# Patient Record
Sex: Female | Born: 1965
Health system: Southern US, Community
[De-identification: ages and names within clinical notes are randomized; demographics above are authoritative.]

## PROBLEM LIST (undated history)

## (undated) DIAGNOSIS — G971 Other reaction to spinal and lumbar puncture: Secondary | ICD-10-CM

## (undated) DIAGNOSIS — F32A Depression, unspecified: Secondary | ICD-10-CM

## (undated) DIAGNOSIS — G629 Polyneuropathy, unspecified: Secondary | ICD-10-CM

## (undated) DIAGNOSIS — E1165 Type 2 diabetes mellitus with hyperglycemia: Secondary | ICD-10-CM

## (undated) DIAGNOSIS — F419 Anxiety disorder, unspecified: Secondary | ICD-10-CM

## (undated) DIAGNOSIS — F329 Major depressive disorder, single episode, unspecified: Secondary | ICD-10-CM

## (undated) DIAGNOSIS — M1990 Unspecified osteoarthritis, unspecified site: Secondary | ICD-10-CM

## (undated) DIAGNOSIS — K219 Gastro-esophageal reflux disease without esophagitis: Secondary | ICD-10-CM

## (undated) DIAGNOSIS — F1910 Other psychoactive substance abuse, uncomplicated: Secondary | ICD-10-CM

## (undated) DIAGNOSIS — M1711 Unilateral primary osteoarthritis, right knee: Secondary | ICD-10-CM

## (undated) DIAGNOSIS — E785 Hyperlipidemia, unspecified: Secondary | ICD-10-CM

## (undated) DIAGNOSIS — B999 Unspecified infectious disease: Secondary | ICD-10-CM

## (undated) DIAGNOSIS — J189 Pneumonia, unspecified organism: Secondary | ICD-10-CM

## (undated) DIAGNOSIS — E119 Type 2 diabetes mellitus without complications: Secondary | ICD-10-CM

## (undated) DIAGNOSIS — I10 Essential (primary) hypertension: Secondary | ICD-10-CM

## (undated) HISTORY — PX: BUNIONECTOMY WITH HAMMERTOE RECONSTRUCTION: SHX5600

## (undated) HISTORY — DX: Type 2 diabetes mellitus without complications: E11.9

## (undated) HISTORY — DX: Hyperlipidemia, unspecified: E78.5

## (undated) HISTORY — PX: JOINT REPLACEMENT: SHX530

## (undated) HISTORY — DX: Other psychoactive substance abuse, uncomplicated: F19.10

## (undated) HISTORY — DX: Major depressive disorder, single episode, unspecified: F32.9

## (undated) HISTORY — PX: BACK SURGERY: SHX140

## (undated) HISTORY — DX: Depression, unspecified: F32.A

## (undated) HISTORY — DX: Essential (primary) hypertension: I10

## (undated) HISTORY — DX: Type 2 diabetes mellitus with hyperglycemia: E11.65

## (undated) HISTORY — DX: Anxiety disorder, unspecified: F41.9

## (undated) HISTORY — PX: VAGINAL HYSTERECTOMY: SUR661

## (undated) HISTORY — PX: TUBAL LIGATION: SHX77

---

## 1998-12-30 ENCOUNTER — Other Ambulatory Visit: Admission: RE | Admit: 1998-12-30 | Discharge: 1998-12-30 | Payer: Self-pay | Admitting: Obstetrics & Gynecology

## 1999-10-15 ENCOUNTER — Other Ambulatory Visit: Admission: RE | Admit: 1999-10-15 | Discharge: 1999-10-15 | Payer: Self-pay | Admitting: Obstetrics & Gynecology

## 2000-12-30 ENCOUNTER — Other Ambulatory Visit: Admission: RE | Admit: 2000-12-30 | Discharge: 2000-12-30 | Payer: Self-pay | Admitting: Obstetrics and Gynecology

## 2001-02-16 ENCOUNTER — Encounter (INDEPENDENT_AMBULATORY_CARE_PROVIDER_SITE_OTHER): Payer: Self-pay

## 2001-02-16 ENCOUNTER — Ambulatory Visit (HOSPITAL_COMMUNITY): Admission: RE | Admit: 2001-02-16 | Discharge: 2001-02-16 | Payer: Self-pay | Admitting: Obstetrics and Gynecology

## 2003-10-30 ENCOUNTER — Other Ambulatory Visit: Admission: RE | Admit: 2003-10-30 | Discharge: 2003-10-30 | Payer: Self-pay | Admitting: *Deleted

## 2004-12-15 ENCOUNTER — Other Ambulatory Visit: Admission: RE | Admit: 2004-12-15 | Discharge: 2004-12-15 | Payer: Self-pay | Admitting: Obstetrics and Gynecology

## 2004-12-18 ENCOUNTER — Observation Stay (HOSPITAL_COMMUNITY): Admission: RE | Admit: 2004-12-18 | Discharge: 2004-12-19 | Payer: Self-pay | Admitting: Obstetrics and Gynecology

## 2004-12-18 ENCOUNTER — Encounter (INDEPENDENT_AMBULATORY_CARE_PROVIDER_SITE_OTHER): Payer: Self-pay | Admitting: Specialist

## 2007-04-04 ENCOUNTER — Emergency Department (HOSPITAL_COMMUNITY): Admission: EM | Admit: 2007-04-04 | Discharge: 2007-04-04 | Payer: Self-pay | Admitting: Emergency Medicine

## 2007-07-03 ENCOUNTER — Inpatient Hospital Stay (HOSPITAL_COMMUNITY): Admission: RE | Admit: 2007-07-03 | Discharge: 2007-07-05 | Payer: Self-pay | Admitting: *Deleted

## 2007-07-03 ENCOUNTER — Ambulatory Visit: Payer: Self-pay | Admitting: *Deleted

## 2007-08-02 ENCOUNTER — Ambulatory Visit (HOSPITAL_COMMUNITY): Payer: Self-pay | Admitting: Psychiatry

## 2007-11-08 HISTORY — PX: POSTERIOR LUMBAR FUSION: SHX6036

## 2007-12-06 ENCOUNTER — Inpatient Hospital Stay (HOSPITAL_COMMUNITY): Admission: RE | Admit: 2007-12-06 | Discharge: 2007-12-09 | Payer: Self-pay | Admitting: Orthopedic Surgery

## 2007-12-07 ENCOUNTER — Ambulatory Visit: Payer: Self-pay | Admitting: Surgery

## 2007-12-07 ENCOUNTER — Encounter (INDEPENDENT_AMBULATORY_CARE_PROVIDER_SITE_OTHER): Payer: Self-pay | Admitting: Orthopedic Surgery

## 2007-12-20 ENCOUNTER — Encounter: Admission: RE | Admit: 2007-12-20 | Discharge: 2007-12-20 | Payer: Self-pay | Admitting: Orthopedic Surgery

## 2008-01-09 HISTORY — PX: POSTERIOR LUMBAR FUSION: SHX6036

## 2008-01-10 ENCOUNTER — Inpatient Hospital Stay (HOSPITAL_COMMUNITY): Admission: RE | Admit: 2008-01-10 | Discharge: 2008-01-11 | Payer: Self-pay | Admitting: Orthopedic Surgery

## 2008-03-06 ENCOUNTER — Ambulatory Visit (HOSPITAL_COMMUNITY): Payer: Self-pay | Admitting: Psychiatry

## 2009-08-19 ENCOUNTER — Encounter: Admission: RE | Admit: 2009-08-19 | Discharge: 2009-08-19 | Payer: Self-pay | Admitting: Obstetrics and Gynecology

## 2009-10-27 ENCOUNTER — Encounter: Admission: RE | Admit: 2009-10-27 | Discharge: 2009-10-27 | Payer: Self-pay | Admitting: Emergency Medicine

## 2010-03-25 ENCOUNTER — Encounter: Admission: RE | Admit: 2010-03-25 | Discharge: 2010-03-25 | Payer: Self-pay | Admitting: Emergency Medicine

## 2010-04-10 ENCOUNTER — Ambulatory Visit (HOSPITAL_COMMUNITY)
Admission: RE | Admit: 2010-04-10 | Discharge: 2010-04-10 | Payer: Self-pay | Source: Home / Self Care | Admitting: Psychiatry

## 2010-05-30 ENCOUNTER — Encounter: Payer: Self-pay | Admitting: Emergency Medicine

## 2010-05-31 ENCOUNTER — Encounter: Payer: Self-pay | Admitting: Obstetrics and Gynecology

## 2010-05-31 ENCOUNTER — Encounter: Payer: Self-pay | Admitting: Emergency Medicine

## 2010-09-22 NOTE — Op Note (Signed)
NAMESHALAYNE, LEACH NO.:  [id number]   MEDICAL RECORD NO.:  [id number]          PATIENT TYPE:  INP   LOCATION:  4153                         FACILITY:  [facility]   PHYSICIAN:  Alvy Beal, MD    DATE OF BIRTH:  09-25-1965   DATE OF PROCEDURE:  DATE OF DISCHARGE:                               OPERATIVE REPORT   PREOPERATIVE DIAGNOSIS:  Degenerative disk disease with discogenic back  pain, L5-S1.   POSTOPERATIVE DIAGNOSIS:  Degenerative disk disease with discogenic back  pain, L5-S1.   OPERATIVE PROCEDURE:  Anterior lumbar interbody fusion, L5-S1.   COMPLICATIONS:  None.   APPROACH SURGEON:  Balinda Quails, MD   FIRST ASSISTANT:  Crissie Reese, PA   COMPLICATIONS:  None.   INSTRUMENTATION USED:  Synthes SynFix 13.5 mm high 12 degree lordotic  interbody spacer packed with Actifuse with four locking screws 25 mm in  length into the body of L5, 20 mm in length into the body of S1.   HISTORY:  This is a very pleasant 42-year woman with severe debilitating  low back pain.  Attempts at conservative management consisting of  physical therapy, injection therapy, narcotic medications had failed to  alleviate her symptoms.  Because of the progressive pain and loss of  quality of life, she elected to proceed with surgery.  All appropriate  risks, benefits and alternatives were discussed with the patient and  consent was obtained.   OPERATIVE NOTE:  The patient was brought to the operating room and  placed supine on the operating table.  After successful induction of  general anesthesia and endotracheal intubation, TEDs, SCD, and a Foley  were applied.  At this point in time, Dr. Madilyn Fireman scrubbed and he did a  standard anterior approach through a Pfannenstiel incision.  Once the L5-  S1 disk space was visualized and the vascular structures mobilized, I  scrubbed into the case and we confirmed the appropriate level with  lateral fluoroscopy.  Once confirmed, I  then incised the L5-S1 disk with  a 10 blade scalpel.  Once the annulotomy was performed, I used a  combination of pituitary rongeurs, curettes and Kerrison rongeurs to  resect the entire disk material.  I then distracted the interbody space  with a laminar spreader and this allowed me to get a small curette  behind the body of S1 and release the annulus.  I repeated this behind  the body of L5.  This prevented any fishmouthing of the vertebral bodies  with distraction and allowed for parallel distraction of the endplates.  At this point, I then used a 2 and 3 mm Kerrison to resect the posterior  longitudinal ligament.  There was significant epidural veins, which I  coagulated with bipolar electrocautery.  At this point, I had an  excellent indirect foraminal decompression bilaterally and the  osteophytes were removed as best I could without risking injury to the  thecal sac.  With the diskectomy completed, I then rasped the endplates,  so I had a nice bleeding bone.   At this point, I then measured  the interbody space and took a large  footprint 13.5 mm high 12-degree lordotic Synthes cage, packed it with a  total of 10 mL of Actifuse and using the __________  introducer,  advanced it to the appropriate depth.  Once it is at the appropriate  depth, I then used the awl, pierced through the endplates and using the  guide system advanced the 25-mm locking screw through the plate and into  the vertebral body of L5 and then a 20 mm through the plate into the  body of S1.  I repeated this, so I had a total of four points of  fixation.  I then irrigated the wound copiously with normal saline,  checked for any venous or arterial bleeding and then removed the  Thompson retractor blades.  I took final AP and lateral x-rays, which  demonstrated midline positioning of the implant, satisfactory depth and  distraction.  I then closed the rectus fascia with a running #1 Vicryl  suture.  I then used a  two-layered 2-0 undyed Vicryl closure to  reapproximate the adipose tissue and then I closed the skin with a 3-0  Monocryl.  Steri-Strips, dry dressing and a final AP x-ray were taken,  which demonstrated no retained fragments of material or instrumentation  other than what was implanted.  The patient was extubated and  transferred to the PACU without any incident.  At the end of the case,  needle counts were correct.      Alvy Beal, MD  Electronically Signed     DDB/MEDQ  D:  12/06/2007  T:  12/06/2007  Job:  [id number]   cc:   Balinda Quails, M.D.

## 2010-09-22 NOTE — Discharge Summary (Signed)
NAMECHANCEY, CULLINANE NO.:  [id number]   MEDICAL RECORD NO.:  [id number]          PATIENT TYPE:  INP   LOCATION:  5023                         FACILITY:  [facility]   PHYSICIAN:  Alvy Beal, MD    DATE OF BIRTH:  1966/01/24   DATE OF ADMISSION:  01/10/2008  DATE OF DISCHARGE:  01/11/2008                               DISCHARGE SUMMARY   PROCEDURE:  Posterior decompression L4 through S1 and a posterior  instrumented fusion L4 through S1.   BRIEF HISTORY:  Brittney Sawyer is a very pleasant 45 year old female who is  now approximately 3 weeks out from her anterior lumbar interbody fusion  at the L5-S1 level.  Unfortunately, the patient was having increasing  complaints of right leg pain and a low back pain that had been ongoing  since surgery.  She is not getting relief by taking her narcotic  medication and the Lyrica is not providing her with any relief.  Her  postoperative CT scan was reviewed and it did demonstrate at the L4-5  level some severe facet hypertrophy and degeneration with a vacuum facet  phenomenon bilaterally and fragmentation along the posterior facet  joints with a mild circumferential disk bulge combined.  There was a  left greater than right mild neural foraminal stenosis, but there was no  definite spinal stenosis and hardware at L5-S1 appeared to be adequately  placed.  The patient also had a postoperative image including a  myelogram, which demonstrated some significant facet arthrosis and some  possible nerve route irritation from bone spurs.  Because of her failure  to improve with conservative measures in the severity of her symptoms,  the patient elected to do a posterior decompression with  instrumentation.  All appropriate risks, benefits, alternatives to  surgery were discussed and a consent was obtained.  The patient  tolerated the procedure very well and was transferred to the PACU in a  stable condition.   BRIEF PHYSICAL EXAM:  On  discharge today, the patient is being  discharged home with stable vital signs, temperature is 97.5, pulse is  103, blood pressure is 93/61, and she is sating 97% on room air.  The  patient has no chest pain or shortness of breath.  Abdomen is soft and  nontender.  The patient is tolerating a regular diet.  She has  significantly improved right leg pain and her strength in her right leg  is now approximately 5/5 compared to the left leg.  The patient's calves  soft and nontender.   The patient's discharge labs include normal BMET with a sodium of 139,  potassium of 4.2, chloride 103, bicarb of 28, glucose 119, BUN of 19,  and creatinine of 0.81, and calcium of 9.0.  The patient's discharge CBC  include a WBC of 12.3, RBC of 2.8, hemoglobin of 9.3, and platelet count  of 286.   HOSPITAL COURSE:  The patient's hospital course was approximately 20  hours in length.  The patient tolerated the procedure very well.  Her  pain was well controlled over the evening.  The  patient successfully  tolerated an advance of diet and is tolerating a regular diet.  The  patient was up and ambulating on her own postoperatively day #1.  Foley  was removed in the operating room and the patient is voiding on her own  and having a bowel movement on her own.  The patient was deemed stable  for discharge to home.   The patient's discharge medications today include all of her current  medications, lisinopril/hydrochlorothiazide 20/12.5 two tablets at  night, citalopram 20 mg once at night, Lyrica 75 mg t.i.d., Percocet and  7.5/325 was not continued, she is going to be switched to 10/325, and  the patient was allowed to continue her nicotine patch.  New medication  at discharge include Robaxin 500 mg.   The patient's a discharge instructions include that she is allowed to  shower postoperatively day #5.  No bath for 6 weeks.  She is to maintain  the Steri-Strips over her wound until we see her in the office in  2  weeks.  She can place a clean gauze over the incision daily.  She will  follow up with [place] in the office 2 weeks after surgery.  The patient is  instructed to call the office at [phone] if she is having increasing  severe pain, weakness, or fevers greater than 101, swelling of the legs  or trouble with a bowel or bladder.  The patient was also instructed to  drink plenty of water and had fiber to her diet to avoid constipation.   FOLLOWUP:  The patient is instructed to make her followup appointment in  2 weeks with Dr. Shon Baton.  She needs to call and schedule this  appointment at [phone].      Brittney Reese, PA      Alvy Beal, MD  Electronically Signed    AC/MEDQ  D:  01/11/2008  T:  01/11/2008  Job:  [id number]

## 2010-09-22 NOTE — Op Note (Signed)
NAMEQUERIDA, BERETTA NO.:  [id number]   MEDICAL RECORD NO.:  [id number]          PATIENT TYPE:  INP   LOCATION:  4153                         FACILITY:  [facility]   PHYSICIAN:  Balinda Quails, M.D.    DATE OF BIRTH:  1965-12-29   DATE OF PROCEDURE:  DATE OF DISCHARGE:                               OPERATIVE REPORT   SURGEON:  Balinda Quails, M.D.   CO-SURGEONAlvy Beal, M.D.   ANESTHESIA:  General endotracheal.   PREOPERATIVE DIAGNOSIS:  L5-S1 degenerative disk disease.   POSTOPERATIVE DIAGNOSIS:  L5-S1 degenerative disk disease.   PROCEDURE:  L5-S1 anterior lumbar interbody fusion (ALIF).   CLINICAL NOTE:  The patient is a 45 year old female with a history of  chronic back pain, L5-S1 degenerative disk disease.  She is scheduled to  undergo L5-S1 ALIF at this time.  She was examined preoperatively in the  holding area, found to be an acceptable candidate for anterior approach.  No contraindications.  Normal lower extremity pulses.  No history of DVT  or pulmonary embolus.   The operative procedure was explained to the patient preoperatively and  also potential risks were reviewed.  The patient consented for surgery.   PROCEDURE IN DETAIL:  The patient was brought to the operating room in a  stable hemodynamic condition.  Placed under general endotracheal  anesthesia.  Foley catheter, arterial line, central venous catheter and  pulse oximetry on the left foot.   Transverse skin incision made in left lower quadrant premarked  projection of L5-S1.  Dissection carried through the subcutaneous tissue  with electrocautery.  Left anterior rectus sheath incised.  The left  rectus muscle mobilized and retracted medially.  The left  retroperitoneal space entered and the left psoas muscle and  genitofemoral nerve identified and preserved.  A left common external  iliac artery identified.  The L5-S1 disk was palpated.  The ureter and  abdominal contents were  rotated anteriorly into the right.  The L5-S1  disk was cleared initially bluntly.  Middle sacral vessels controlled  with clips and divided.  The L5-S1 disk was completely exposed with  blunt dissection from left-to-right.   There were no apparent complications during mobilization and exposure.  A Thompson retractor with Brau blades was then assembled and the reverse  lipped Brau blades placed on the lateral margins of L5-S1 bilaterally.   This allowed full exposure of the L5-S1 disk.  Dr. Shon Baton then completed  L5-S1 ALIF.  Closure also dictated under a separate heading by Dr.  Shon Baton.  No apparent complications.      Balinda Quails, M.D.  Electronically Signed     PGH/MEDQ  D:  12/06/2007  T:  12/07/2007  Job:  [id number]

## 2010-09-22 NOTE — Op Note (Signed)
Brittney Sawyer, Brittney Sawyer NO.:  [id number]   MEDICAL RECORD NO.:  [id number]          PATIENT TYPE:  INP   LOCATION:  5023                         FACILITY:  [facility]   PHYSICIAN:  Alvy Beal, MD    DATE OF BIRTH:  04-May-1966   DATE OF PROCEDURE:  01/10/2008  DATE OF DISCHARGE:                               OPERATIVE REPORT   PREOPERATIVE DIAGNOSES:  1. Radicular right leg pain with degenerative facet arthrosis, L4-5      and L5-S1.  2. Status post anterior lumbar interbody fusion, L5-S1.   POSTOPERATIVE DIAGNOSES:  1. Radicular right leg pain with degenerative facet arthrosis, L4-5      and L5-S1.  2. Status post anterior lumbar interbody fusion, L5-S1.   OPERATIVE PROCEDURES:  1. Posterior decompression, L4-S1.  2. Posterior instrumented fusion, L4-S1.   INSTRUMENTATION USED:  Pedicle screws radius on the right side with 40-  mm pedicle screws, 5.75 diameter at L4-L5 and 30-mm, 5.75 pedicle screw  at S1.  On the left side, there were two 30-mm facet screws placed at L4-  5 and L5-S1.   This is a very pleasant woman who about 2-1/2 weeks ago underwent an L5-  S1 diskectomy and fusion.  The patient tolerated the procedure well, but  postoperatively noted to have an L5 weakness with a footdrop and severe  back and right leg pain.  Postoperative imaging included a  myelogram,which demonstrated significant facet arthrosis and some  possible nerve irritation from bone spurs.  Because of the failure to  improve with conservative measures and the severity of her symptoms, we  elected to do a posterior decompression and instrumentation.  All  appropriate risks, benefits, and alternatives of surgery were discussed,  and consent was obtained.   OPERATIVE NOTE:  The patient was brought to the operating room and  placed supine on the operating table.  After the patient was placed  supine on the [place] table, all bony prominences were developed and the  patient was  turned prone on the Manning frame.  The arms were placed  overhead.  The back was prepped and draped in a standard fashion.   A midline incision was then made.  Sharp dissection was carried out down  to the deep fascia.  Deep fascia was sharply incised, and the Cobb  elevator was used to strip the paraspinal muscles to expose the L4, L5,  and S1 spinous process and lamina.  With appropriate midline approach at  L2 and L3, I then confirmed the L5 lamina with the lateral  intraoperative fluoroscopy.  Once confirmed, I then resected the entire  L4 and L5 spinous process and performed a complete laminectomy of L5.  Using a Penfield 4, I developed a plane underneath the ligamentum flavum  and then used a 3-mm Kerrison rongeur to resect the ligamentum flavum in  the entire L5 lamina.  I then continued up centrally to the L4 level  completing the L4 central decompression.  On the right side, I did a  complete laminectomy and left the left side essentially with a  distal  laminotomy.  I then proceeded down the lateral gutter protecting the  thecal sac and resecting the entire posterior lateral gutter.  There  were significant posterolateral stenosis, hard disk osteophyte, and  posterior bone spurs, I resected this and continued my lateral  decompression, so I could clearly visualize the medial border of the L4  pedicle.  Once I confirmed this, I then performed a generous  foraminotomy of the L4 neural foramen again using 2- and 3-mm Kerrison  rongeurs.  At this point with the right L4 nerve root completely  decompressed and the right lateral recess completely decompressed, I  then exposed the L4-5 disk space to ensure that there was no disk  fragment.  Once I had confirmed this, I had an adequate posterolateral  decompression on the right side.  I then proceeded down to the L5-S1  level.  Again, I decompressed out laterally until I could visualize the  medial wall of the pedicle.  I then performed  a generous frenotomy.  At  this point, I could clearly visualize the L5 nerve root and traversing  S1 nerve root, they were completely decompressed and free of any undue  compression.  I then was able to sweep the thecal sac and the nerve root  medially to expose the disk and the posterior annulus.  I incised the  posterior annulus, and I could clearly visualize the ALIF cage, which  was properly positioned and it was not impacting the nerve root at all.   At this point, with the complete right lateral recess decompressed, I  placed a dural elevator out the L4, L5, and S1 neural foramen, taking x-  rays at each level to confirm I had proper position and decompression.  There was no significant undue tension or pressure on the traversing or  an exiting nerve root in the decompressed zone.  I then attempted to  place facet screws on the right side, but because of the extensive  decompression, I did not have an adequate bony platform to do this.  I  then exposed the transverse process using a Cobb elevator and then under  fluoroscopic guidance, I advanced the pedicle probe through the  transverse process into the L4 pedicle and into the L4 vertebral body.  I palpated this hole and then used a tap.  I then directly visualized  the medial wall and inferior wall of the L4 pedicle to confirm that  there was no violation.  Once we had confirmed that there was no  violation, I then placed a 40-mm long pedicle screw down the pedicle and  I had excellent purchase.  I repeated this technique at L5 and S1 again  taking time to not only directly to palpate the hole with a ball-tip  probe, but also to directly visualize the medial and inferior walls of  the pedicle to ensure that there was no breakout.  On the left side,  there was adequate bone left that I could not get a transfacet screw.  Using lateral and AP fluoroscopy, I advanced the drill guide through the  facet complex of L4-5 and into the L5  pedicle.  I palpated the hole,  packed the facet complex with bone, and then placed a 30-mm lag screw.  I repeated this at L5-S1 again palpating and directly visualizing the  pedicle to ensure that there was no violation.  At this point, I  contoured a rod, secured it to the pedicle screw construct, and  irrigated the wound copiously with normal saline.  I then packed the  posterior lateral gutter with the local bone and mixed with Actifuse.  At this point, I then palpated again the length of my decompress zone to  ensure that the central and right lateral recess was completely  decompressed.  Having confirmed this, I then irrigated the wound, placed  the deep drain, closed the deep fascia with interrupted #1 Vicryl  sutures, superficial, and then a running 0, superficial 2-0 Vicryl  sutures, and 3-0 Monocryl for the skin.  Steri-Strips and a dry dressing  was applied.  She was extubated and transferred to PACU without  incident.   FIRST ASSISTANT:  Crissie Reese, PA      Alvy Beal, MD  Electronically Signed     DDB/MEDQ  D:  01/10/2008  T:  01/11/2008  Job:  [id number]

## 2010-09-22 NOTE — Discharge Summary (Signed)
Brittney Sawyer, Brittney Sawyer NO.:  [id number]   MEDICAL RECORD NO.:  [id number]          PATIENT TYPE:  IPS   LOCATION:  0300                          FACILITY:  [facility]   PHYSICIAN:  Jasmine Pang, M.D. DATE OF BIRTH:  November 26, 1965   DATE OF ADMISSION:  07/03/2007  DATE OF DISCHARGE:  07/05/2007                               DISCHARGE SUMMARY   IDENTIFYING INFORMATION:  This is a 44 year old white female who was  admitted on a voluntary basis on July 03, 2007.   HISTORY OF PRESENT ILLNESS:  The patient presented with 24-48 hours of  some vague suicidal thoughts without a plan.  She says her life has been  going on beautifully for her for 3 years of abstinence from alcohol and  1-1/2 years of abstinence from opiates until 2 days previously.  At that  point, she learned that her partner 7 years had been cheating on her  with other women.  She says that she has been in a stable relationship  with this gentleman for 7 years and found out that he was having an  affair by findings on e-mails at home and numbers in the cell phone.  For the past 2 days, she has been having fits of anger and rage.  She  remains in the home with him.  They had been arguing and fighting.  She  denies any homicidal thoughts, but reports that her sleep has been  disruptive with agitation and picturing him with other women.  She was  considering drinking, but has not relapsed on alcohol or drugs.  She  does admit to some fluctuating anxiety and agitation in the past 72  hours, but at this point, plans to remain on the relationship.  Sleep  has been decreased to 1-1/2 to 2 hours at a time for the past 48 hours.  There has been no active suicidal thoughts today.  She reports, she was  quite tearful and a bit agitated when she was admitted last night.  She  calmed down a bit and spoken with her AA sponsor, which made her feel  better.  She has been to group, which she thinks is helping her.   PAST  PSYCHIATRIC HISTORY:  The patient currently is planning to restart  with Pauletta Browns for counseling in [place].  She has also been  seeing Bradley Ferris for psychotherapy due to her chemical dependence.  This  is her first [facility] admission.  She does have a  history of prior admissions to [facility] in 1993 for alcohol and  substance abuse.  More recently in 2006, she went through the [facility] for substance abuse and attended [facility] in [place], [place], which is a long-term substance rehab facility.  Since that time, she has been abstinent from alcohol for the past 3  years as in the interim.  Since that time, she has developed some abuse  of opiates, because of chronic back pain and stopped those.  She has  been completely abstinent from opiates for the past year and  a half.  She attends weekly AA meetings.  She is currently treated by her primary  care physician with Celexa 20 mg for depression.  She does have a  history of cutting herself in the past, but not recently.  She denies  any previous attempts of suicide.   SUBSTANCE ABUSE:  The patient smokes half a pack per day.   MEDICAL PROBLEMS:  Chronic back pain.   CURRENT MEDICATIONS:  1. Celexa 20 mg daily.  2. Lisinopril and hydrochlorothiazide 20/12.5 mg daily.  3. Trazodone 50 mg p.o. nightly.   DRUG ALLERGIES:  CELEBREX and WELLBUTRIN.   PHYSICAL EXAM:  The patient's physical exam was done here in our unit.  She was a healthy female who was in no acute distress.  There were no  physical or medical problems noted.   DIAGNOSTIC STUDIES:  Liver enzymes were normal.  Chemistries were  normal.  She had an unremarkable CBC.  Urine drug screen was negative.  TSH was 1.402, which was within normal limits.  Urinalysis was within  normal limits.   HOSPITAL COURSE:  Upon admission, the patient was placed on trazodone  100 mg p.o. nightly, p.r.n. insomnia and Singulair 10 mg  daily p.r.n.  allergies or asthma.  In individual sessions with me, the patient was  friendly and cooperative.  She also participated appropriately in unit  therapeutic groups and activities.  Primary therapeutic themes included,  the grief over realizing her boyfriend for the past 7 years has been  unfaithful with several women.  She began to be afraid of her anger and  rage.  She denied active suicidal ideation though it had fleeting  thoughts of wanting to be dead.  She was on Celexa 20 mg p.o. q.day,  lisinopril and hydrochlorothiazide 20/12.5 mg daily, Motrin 800 mg p.o.  b.i.d. p.r.n. pain.  As hospitalization progressed, the patient became  less depressed and less anxious.  Her sleep was fairly good.  Appetite  was good.  The mood was euthymic.  Affect consistent with mood.  There  was no suicidal or homicidal ideation.  No thoughts of self-injurious  behavior.  No auditory or visual hallucinations.  No paranoia or  delusions.  Thoughts were logical and goal-directed.  Thought content,  no predominant theme.  Cognitive was grossly back to baseline.  The  patient began to feel more hopeful that she can work out the situation  with her boyfriend.  She had been talking with him while in the hospital  and was heartened by his response.  She wanted to be discharged and was  felt safe to go home.   DISCHARGE DIAGNOSES:  Axis I:  Major depression recurrent severe without  psychosis.  Alcohol abuse in remission.  Axis II:  None.  Axis III:  No diagnosis.  Axis IV:  Severe (relationship stress, psychosocial issues, burden of  psychiatric illness, burden of chemical dependence illness).  Axis V:  Global assessment of functioning was 55 upon discharge.  GAF  was 50 upon admission.  GAF highest past year was 70.   DISCHARGE PLAN:  There was no specific activity level or dietary  restrictions.   POSTHOSPITAL CARE PLANS:  The patient will be seen by Dr. Lolly Mustache at the  [facility] on March 25th at 10:30  a.m. and Pauletta Browns, therapist on March 3rd at 12 noon.   DISCHARGE MEDICATIONS:  1. Celexa 20 mg daily.  2. Lisinopril and hydrochlorothiazide 20/12.5 mg daily.  3. Trazodone 100 mg at bedtime.      Jasmine Pang, M.D.  Electronically Signed     BHS/MEDQ  D:  07/26/2007  T:  07/27/2007  Job:  [id number]

## 2010-09-22 NOTE — H&P (Signed)
NAMEBRENDA, Brittney Sawyer NO.:  [id number]   MEDICAL RECORD NO.:  [id number]          PATIENT TYPE:  IPS   LOCATION:  0300                          FACILITY:  [facility]   PHYSICIAN:  Jasmine Pang, M.D. DATE OF BIRTH:  1966/04/18   DATE OF ADMISSION:  07/03/2007  DATE OF DISCHARGE:                       PSYCHIATRIC ADMISSION ASSESSMENT   DATE OF ASSESSMENT:  July 04, 2007, at 10 o'clock  a.m.   IDENTIFYING INFORMATION:  This is a 45 year old white female who is a  voluntary admission.   HISTORY OF PRESENT ILLNESS:  This patient presented with 24-48 hours of  some vague suicidal thoughts without a plan.  She says that life has  been going on beautifully for her for 3 years of abstinence from alcohol  and 1-1/2 years of abstinence from opiates until 2 days previously when  she learned that her partner of 7 years had been cheating on her with  other women.  She says that she had been in a stable relationship with  this gentleman for 7 years and found out that he was having an affair by  finding some e-mails at home and numbers and is cell phone.  For the  past 2 days, she has been having fits of anger and rage. She remains in  the home with him.  They have been arguing and fighting. She denies any  homicidal thoughts but reports that her sleep has been disrupted with  agitation and picturing him with other women.  She was considering  drinking but has not relapsed on alcohol or drugs.  She does admit to  some fluctuating anxiety and agitation in the past 72 hours, but at this  point plans to remain in the relationship. Sleep has been decreased to 1-  1/2 to 2 hours at a time for the past 48 hours.  No active suicidal  thoughts today.  She reports that she was quite tearful and a bit  agitated when she was admitted last evening, has calmed down quite a bit  and has spoken with her AA sponsor, which made her feel better, and has  been to group which she thinks is  helping her.  She is calm and coherent  today.   PAST PSYCHIATRIC HISTORY:  The patient currently is planning to restart  with Belenda Cruise for counseling here in [place] and has been seeing  also Jannet Mantis for psychotherapy.  This is her first [facility] admission.  She does have a history of prior admissions to  [facility] in 1993 for alcohol and substance abuse.  More recently  in 2006 she went through [facility] for substance abuse  and attended [facility] in [place], [place], which is a long-  term substance rehab facility. Since that time, she has been abstinent  from alcohol. For the past 3 years in the interim since that time, she  had developed some abuse of opiates because of chronic back pain and  stopped those and has been completely abstinent from opiates for the  last year and one-half. She attends weekly AA  meetings. She is currently  treated by her primary care physician with Celexa 20 mg for depression.  She does have some history of cutting herself in the past, not recently.  She denies any previous attempts at suicide.   SOCIAL HISTORY:  A 45 year old white female, has two children, an 55-  year-old son, a 54 year old daughter, both in school here in [place],  and a 23 year old daughter in school at the local university studying  psychotherapy. She shares custody of the children with her ex-husband  and is currently divorced. Continues to live with her partner, Trey Paula, of  7 years. Her mother has a history of alcoholism and 32 years in recovery  and is supportive of her and is coming to visit tomorrow night.  Mother  resides in [place], and sister resides in [place].  Mother has given  her a retreat in [place] for women in recovery this coming weekend.  She also reports a strong support group in Alcoholics Anonymous. No  legal problems.  She is currently unemployed.   ALCOHOL AND DRUG HISTORY:  As noted  above.  She also smokes tobacco 1/2  pack per day, duration unknown.   MEDICAL HISTORY:  The patient's primary care physician is Dr. Knox Royalty.   Medical problems are chronic back pain.   CURRENT MEDICATIONS:  1. Celexa 20 mg daily.  2. Lisinopril/hydrochlorothiazide 20/12.5 mg daily.  3. Trazodone 50 mg daily.   DRUG ALLERGIES:  CELEBREX and WELLBUTRIN.   POSITIVE PHYSICAL FINDINGS:  The patient's physical exam was done here  on the unit.  It is noted in the record.  This is a healthy white female  who appears healthy.  She is in no distress.  Reports taking her  medications regularly and gives a clear medication history and endorses  compliance.   DIAGNOSTIC STUDIES:  Reveal normal liver enzymes, normal chemistry, and  unremarkable CBC. TSH is currently pending as is her urine drug screen.  Her urinalysis is unremarkable.   MENTAL STATUS EXAM:  Fully alert female, calm and composed today, polite  with appropriate affect.  Gives a coherent detailed history about the  situation and what her concerns are. Speech is normal.  Mood depressed.  Thought process logical, coherent, linear thinking. No flight of ideas.  No evidence of psychosis.  No active suicidal or homicidal thoughts.  She reports that she has been for the past couple days striving for some  stability. Had been staying with a friend and getting out of the house  and some physical distance between her and her partner has been  beneficial. She has been writing in her journal and meeting with her  sponsor to maintain stability. Is requesting some help with a little bit  better sleep at night. Trazodone as been ineffective.  She gives a  coherent history.  Thinking is goal oriented.  She is working on  Editor, commissioning her support system, and her mother is coming tomorrow night  to visit her. Cognition is fully preserved.  She is oriented x4.   AXIS I:  1. Depressive disorder not otherwise specified.  2. Alcohol abuse  in remission.  AXIS II:  Deferred.  AXIS III:  No diagnosis.  AXIS IV:  Severe relationship stress.  AXIS V:  Current 50-55, in past year 78.   PLAN:  Voluntarily admit the patient with 15-minute checks in place with  a goal of alleviating any suicidal thoughts and improving her coping  mechanism, reinforcing her abstinence. We  are going to increase her  trazodone to 100 mg 1/2 to 1 tablet at bedtime p.r.n. Get her into  counseling here with groups and will go from there.  She has expressed  her agreement with the plan.  Estimated length of stay is 3-5 days.      Margaret A. Lorin Picket, N.P.      Jasmine Pang, M.D.  Electronically Signed    MAS/MEDQ  D:  07/04/2007  T:  07/04/2007  Job:  [id number]

## 2010-09-25 NOTE — Op Note (Signed)
[facility]  Patient:    Brittney Sawyer, Brittney Sawyer Visit Number: [id number] MRN: [id number]          Service Type: DSU Location: [facility] Attending Physician:  Soledad Gerlach Dictated by:   Guy Sandifer Arleta Creek, M.D. Proc. Date: 02/16/01 Admit Date:  02/16/2001                             Operative Report  PREOPERATIVE DIAGNOSES:       1. Dysmenorrhea.                               2. Menorrhagia.                               3. Desires permanent sterilization.  POSTOPERATIVE DIAGNOSES:      1. Uterine leiomyomata.                               2. Pelvic adhesions.                               3. Left paratubal cyst.                               4. Menorrhagia.                               5. Desires permanent sterilization.  PROCEDURES:                   1. Laparoscopy.                               2. Application of Filshie clips.                               3. Lysis of adhesions.                               4. Pelvic biopsy.                               5. Resection of left paratubal cyst.                               6. Hysteroscopy, dilation and curettage.  SURGEON:                      Guy Sandifer. Arleta Creek, M.D.  ANESTHESIA:                   General with endotracheal intubation.  ANESTHESIOLOGIST:             Gretta Cool., M.D.  ESTIMATED BLOOD LOSS:         Less than 30 cc.  IN-AND-OUT OF SORBITOL DISTENDING MEDIUM:   20 cc deficit.  INDICATIONS  AND CONSENT:      The patient is a 45 year old separated white female, G4, P3, AB1 with increasing symptoms of menorrhagia and dysmenorrhea. Details are dictated in the history and physical.  She also requests permanent sterilization.  The potential risks and complications have been discussed including but not limited to infection; uterine perforation; bowel, bladder or ureteral damage; bleeding requiring transfusion of blood products with possible  transfusion reaction, HIV  and hepatitis acquisition; DVT; PE; pneumonia; permanence of the tubal ligation; failure rate of tubal ligation and increased ectopic risk.  All questions have been answered and consent was signed on the chart.  FINDINGS:                     The uterine cavity had a shaggy endometrium.  No specific abnormal vessels or structures were noted.  The fallopian tube ostia were identified bilaterally.  Abdominally, the upper abdomen appeared grossly normal.  The appendix was normal.  The uterus had a 1.5 cm subserosal leiomyoma immediately superior to the insertion of the left fallopian tube. There was a 0.5 cm subserosal anterior leiomyoma.  There was also a 0.5 cm subserosal leiomyoma on the posterior fundus.  The left fallopian tube contained two 1 cm smooth translucent paratubal cysts on stalks.  The right fallopian tube was normal.  The ovaries were normal bilaterally.  There were adhesions of the bowel to the left pelvic brim.  There were multiple translucent vesicular structures in the anterior cul-de-sac and over the dome of the bladder.  The pelvic side walls were normal.  DESCRIPTION OF PROCEDURE:     The patient was taken to the operating room and placed in the dorsal supine position, where general anesthesia was induced via endotracheal intubation.  She was then placed in the dorsal lithotomy position, where she was prepped abdominally and vaginally.  The bladder was straight catheterized and she was draped in a sterile fashion.  A bivalve speculum was placed in the vagina and the anterior cervical lip was grasped with a single-tooth tenaculum.  The cervix was then gently and progressively dilated to a #27 Pratt dilator.  A diagnostic hysteroscope was placed in the cervix and advanced under direct visualization using Sorbitol distending medium.  The above findings were noted.  The hysteroscope was withdrawn and sharp curettage was carried out.  The single-tooth tenaculum was then  replaced with a Hulka tenaculum.  Attention was turned to the abdomen.  A small infraumbilical incision was made and a 10-11 disposable trocar sleeve was placed without difficulty on the first attempt.  Placement was verified with the laparoscope and no damage to surrounding structures was noted. Pneumoperitoneum was induced.  A small suprapubic incision was made and a 5 mm nondisposable trocar sleeve was placed under direct visualization without difficulty.  The above findings were noted.  One of the vesicular structures in the anterior cul-de-sac was then biopsied.  Bipolar cautery was used to obtain hemostasis at the peritoneal edges after the biopsy.  The left paratubal cysts were also resected.  Bipolar cautery was also used to obtain hemostasis at the point of resection of the cysts from the fallopian tube. The adhesions to the left pelvic brim were taken down sharply and the point of insertion on the pelvic side wall was cauterized with bipolar cautery to obtain complete hemostasis.  The Filshie clip applicator was then backloaded and the right fallopian tube was identified from cornu to fimbria.  A Filshie clip was placed along  the proximal portion of the tube.  A similar procedure was carried out on the left fallopian tube.  The Filshie clip applicator was then removed and careful inspection revealed clearly that the entire width of the fallopian tube was within the clip.  The ______ clip could also be seen easily through the mesosalpinx bilaterally.  The suprapubic trocar sleeve was removed and pneumoperitoneum was reduced.  There was some bleeding noted from the peritoneal edge at the trocar insertion site suprapubically. Pneumoperitoneum was then reintroduced and bipolar cautery was used to obtain hemostasis at the peritoneal edges of the suprapubic trocar insertion site. Pneumoperitoneum was reduced again and good hemostasis was noted. P)neumoperitoneum was completely reduced  and all instruments were removed. The skin incision in the umbilicus was closed with subcuticular 3-0 Vicryl suture.  The suprapubic incision was closed with Dermabond.  Dermabond was  also applied over the umbilical incision.  The incisions were injected with 0.5% plain Marcaine.  At the completion of the procedure, all counts were correct.  The patient was awakened and taken to the recovery room in stable condition. Dictated by:   Guy Sandifer Arleta Creek, M.D. Attending Physician:  Soledad Gerlach DD:  02/16/01 TD:  02/16/01 Job: [id number]

## 2010-09-25 NOTE — Discharge Summary (Signed)
NAMEEPIPHANY, [name] NO.:  [id number]   MEDICAL RECORD NO.:  [id number]          PATIENT TYPE:  INP   LOCATION:  4153                         FACILITY:  [facility]   PHYSICIAN:  Alvy Beal, MD    DATE OF BIRTH:  03-Oct-1965   DATE OF ADMISSION:  12/06/2007  DATE OF DISCHARGE:  12/09/2007                               DISCHARGE SUMMARY   ADMITTING DIAGNOSIS:  Degenerative lumbar spinal disk disease.   DISCHARGE DIAGNOSIS:  Degenerative lumbar spinal disk disease.   OPERATIVE PROCEDURE:  Done on December 06, 2007, was an anterior lumbar  interbody fusion, L5-S1.   HISTORY:  Brittney Sawyer is a very pleasant 45 year old woman who has been  suffering from chronic low back pain for some time now.  Attempts of  conservative management to control her pain had failed.  This consisted  of physical therapy, injection therapy, and narcotic medications.  After  the failure of conservative management, she elected to proceed with  surgery.  All appropriate risks, benefits, and alternatives to surgery  were discussed and the patient elected to proceed.   On December 06, 2007, she was taken to the operating room for an anterior  lumbar interbody fusion.  This went uneventful and the patient was  transferred to the floor.   HOSPITAL COURSE:  On December 06, 2007, she was admitted to the orthopedic  floor.  She was alert, oriented, and doing quite well.  On postoperative  day #1, she had a CT scan and lower extremity Doppler.  The Doppler  demonstrated no evidence of any lower extremity DVTs and a CT scan  showed adequate decompression of the L5-S1 disk and had the placement of  the hardware.   The patient continued to have pain.  She was switched from her  postoperative PCA to oral medications.   The patient was tolerating this well and she was mobilized appropriately  with physical therapy.   On December 08, 2007, she was doing well.  She was stable.  She was given an  enema for her  constipation.  She was cleared by physical therapy and she  was ambulating 12 stairs, had no assistance, and she was following  instructions.  She also negotiated two stairs with no rails and  independently ambulated.  As a result on December 08, 2007, she was  reevaluated and her pain was well controlled.  She was discharged to  home on December 09, 2006, with appropriate followup in 2 weeks.   Her discharge medications included Percocet for pain control and Robaxin  for muscle spasms.  She was also restarted on her home medications.   She was discharged to home with appropriate followup and given  appropriate instructions.  These were the preprinted discharge  instructions for an anterior interbody fusion.  All of her questions  were addressed.  The patient was hemodynamically and neurovascular  intact upon discharge.      Alvy Beal, MD  Electronically Signed     DDB/MEDQ  D:  02/08/2008  T:  02/09/2008  Job:  [id number]

## 2010-09-25 NOTE — H&P (Signed)
NAMENATORIA, ARCHIBALD NO.:  [id number]   MEDICAL RECORD NO.:  [id number]           PATIENT TYPE:   LOCATION:                                FACILITY:  [facility]   PHYSICIAN:  Osborn Coho, M.D.   DATE OF BIRTH:  [birth year]   DATE OF ADMISSION:  DATE OF DISCHARGE:                                HISTORY & PHYSICAL   CHIEF COMPLAINT:  Painful and heavy cycles for many years.   HISTORY:  Brittney Sawyer is a 45 year old gravida 4, para 3-0-1-3, with a last  menstrual period of November 29, 2004, with complaints of painful and heavy  cycles for many years.  She declined hormonal treatment secondary to side  effects and requested definitive surgical management.  The patient thought  she had a history of endometriosis; however, after reviewing previous  operative report, no endometriotic lesions were noted.  The patient had  required narcotic pain medicine for her dysmenorrhea and, as previously  noted, ultimately wanted definitive surgical management.  The options were  discussed at length with the patient, including performing laparoscopy and  once again looking for any endometriotic lesions or evidence of  endometriosis which could be contributing to her pain.  The patient was in  agreement and wanted a hysterectomy and if no endometriosis was noted or no  grossly abnormal findings of the ovaries were noted, wanted to leave the  ovaries in place.   PAST OBSTETRICAL HISTORY:  Elective termination of pregnancy x1 in the first  trimester, normal spontaneous vaginal delivery full-term x2 and preterm x1.   PAST GYNECOLOGIC HISTORY:  History of regular menses.  Denies a history of  gonorrhea of Chlamydia.  Mammogram about four to five years ago, which was  negative per patient.  Positive history of abnormal Pap years ago with  negative Pap smears since.   PAST MEDICAL HISTORY:  Hypertension, bipolar disorder controlled on  medications.  The patient denies diabetes or asthma.   PAST SURGICAL HISTORY:  Diagnostic laparoscopy in 2001 and BTL.   MEDICINES:  Prozac, Lamictal and trazodone p.r.n.   ALLERGIES:  No known drug allergies; however, cannot take Wellbutrin  secondary to a rash, per patient.   SOCIAL HISTORY:  Three to five cigarettes daily, alcohol use occasionally,  no illicit drug use.  Lives with boyfriend and three children and his two  children.  The children live with them part-time.   FAMILY HISTORY:  Hypertension in her dad.   REVIEW OF SYSTEMS:  Denies chest pain or shortness of breath with activity.  Denies fevers, chills, nausea, vomiting, diarrhea, GU or GI problems.   PHYSICAL EXAMINATION:  HEENT:  Within normal limits.  CARDIAC:  Heart rate and rhythm are regular.  CHEST:  Lungs clear to auscultation bilaterally.  ABDOMEN:  Soft and nontender.  PELVIC:  External genitalia within normal limits.  Normal-size uterus and no  palpable adnexal masses.   ASSESSMENT AND PLAN:  Brittney Sawyer is a 45 year old para 3 with menorrhagia and  dysmenorrhea requiring narcotic pain management.  The patient declined  hormonal management and wanted definitive  management with a hysterectomy.  As discussed earlier, the patient wanted to leave the ovaries in place  unless there was any evidence of endometriosis or ovarian lesion.  The  risks, benefits, and alternatives were discussed at length with the patient,  including but not limited to bleeding, infection and injury as well as the  possibility that the pain would not be relieved definitively.  Preop labs  will be done prior to the procedure.  Consent to be signed and witnessed  prior to procedure.      Osborn Coho, M.D.  Electronically Signed     AR/MEDQ  D:  01/01/2005  T:  01/01/2005  Job:  [id number]

## 2010-09-25 NOTE — H&P (Signed)
[facility]  Patient:    Brittney Sawyer, Brittney Sawyer Visit Number: [id number] MRN: [id number]          Service Type: Attending:  Guy Sandifer. Arleta Creek, M.D. Dictated by:   Guy Sandifer Arleta Creek, M.D. Adm. Date:  02/16/01                           History and Physical  CHIEF COMPLAINT:              Dysmenorrhea, menorrhagia, and desires permanent sterilization.  HISTORY OF PRESENT ILLNESS:   The patient is a 45 year old separated white female, gravida 4, para 3, AB 1, who complains of increasingly irregular menses over the past year or so.  She has increasing flows with clotting and heavy bleeding that is worse at some cycles than others.  She also has significant pelvic and back pain with her menses that radiates down to her knees bilaterally.  This can sometimes take her off of her feet.  On examination, the uterus is anteverted, upper limits of size and boggy.  ON ultrasound, the uterus measures 8.56 x 4.91 x 5.50 cm.  There is at least one submucosal fibroid noted measuring 3.1 cm.  The ovaries are normal bilaterally.  Sonohistogram reveals 4-5 small nodules clustered in the endometrial cavity, measuring 2.8 cm _______.  After carefully discussing the above, laparoscopy with hysteroscopy and D&C is proposed and discussed.  The patient also request permanent sterilization. Tubal ligation was discussed including permanence, failure rate, and increased ectopic risk.  All questions have been answered.  PAST MEDICAL HISTORY:         Negative.  PAST SURGICAL HISTORY:        Wisdom teeth removal.  OBSTETRIC HISTORY:            Vaginal delivery x 3 and termination x 1.  SOCIAL HISTORY:               The patient smokes a half-pack-a-day.  Denies alcohol or drug abuse.  MEDICATIONS:                  Darvocet p.r.n. cramping.  ALLERGIES:                    No known drug allergies.  FAMILY HISTORY:               Breast cancer in maternal great-grandmother. Chronic  hypertension in father and sister.  Hypothyroidism in mother.  REVIEW OF SYSTEMS:            Negative except as above.  PHYSICAL EXAMINATION:  VITAL SIGNS:                  Height 5 feet and 1-1/2 inches.  Weight 137-1/2 pounds.  Blood pressure 120/80.  HEENT:                        Without thyromegaly.  LUNGS:                        Clear to auscultation.  HEART:                        Regular rate and rhythm.  BACK:                         Without  CVA tenderness.  BREASTS:                      Without masses, retractions, or discharge.  ABDOMEN:                      Soft and nontender without masses.  PELVIC EXAMINATION:           The vulva, vagina, and cervix without lesions. Uterus anteverted, upper limits of normal size, boggy, and nontender.  Adnexa nontender and without masses.  EXTREMITIES:                  Grossly within normal limits.  NEUROLOGICAL EXAMINATION:     Grossly within normal limits.  ASSESSMENT:                   1. Dysmenorrhea.                               2. Menorrhagia.                               3. Desires permanent sterilization.  PLAN:                         Laparoscopy with application of Filshie clips, hysteroscopy, and D&C. Dictated by:   Guy Sandifer Arleta Creek, M.D. Attending:  Guy Sandifer Arleta Creek, M.D. DD:  02/15/01 TD:  02/15/01 Job: [id number]

## 2010-09-25 NOTE — Op Note (Signed)
Brittney Sawyer, Brittney Sawyer                 ACCOUNT NO.:  [id number]   MEDICAL RECORD NO.:  [id number]          PATIENT TYPE:  OBV   LOCATION:  9313                          FACILITY:  [facility]   PHYSICIAN:  Osborn Coho, M.D.   DATE OF BIRTH:  08-25-1965   DATE OF PROCEDURE:  12/18/2004  DATE OF DISCHARGE:  12/19/2004                                 OPERATIVE REPORT   PREOPERATIVE DIAGNOSES:  1.  Menorrhagia.  2.  Dysmenorrhea.   POSTOPERATIVE DIAGNOSES:  1.  Menorrhagia.  2.  Dysmenorrhea.   OPERATION/PROCEDURE:  Laparoscopic-assisted vaginal hysterectomy.   SURGEON:  Osborn Coho, M.D.   ASSISTANT:  Naima A. Normand Sloop, M.D.   ANESTHESIA:  General.   SPECIMENS SENT TO PATHOLOGY:  Uterus and cervix.   FLUIDS:  2000 mL.   ESTIMATED BLOOD LOSS:  300 mL.   URINARY OUTPUT:  300 mL.   COMPLICATIONS:  None.   FINDINGS:  No ovarian lesions and no evidence of endometriosis.   DESCRIPTION OF PROCEDURE:  The patient was taken to the operating room after  the risks, benefits and alternatives reviewed with the patient.  The patient  verbalized understanding and consent signed and witnessed.  The patient was  placed under general anesthesia and prepped and draped in the normal sterile  fashion in the dorsal lithotomy position.  A 10 mm incision was made at the  umbilicus and the Veress needle passed into the intraabdominal cavity and  pneumoperitoneum achieved.  The 10 mm trocar was then advanced into the  intra-abdominal cavity and laparoscope introduced with no abnormal findings  as mentioned above.  Laparoscopic was then removed and the abdomen draped.   Attention was then turned to the vaginal portion of the case and weighted  speculum was placed in the patient's vagina.  Vaginal wall retractors were  used and a tenaculum was placed on the cervix.  The cervix was circumscribed  using a scalpel and the posterior cul-de-sac entered using the Mayo  scissors.  The posterior vaginal wall  was then tagged with 0 Vicryl.  The  uterosacral ligaments were then bilaterally clamped with curved Heaney  clamps, cut and suture ligated using 0 Vicryl.  Attention was then turned to  the anterior cul-de-sac which was entered after dissecting away the bladder  from the cervix.  The anterior cul-de-sac was entered and no bladder entry  was noted.  Sequentially and bilaterally, the cardinal ligaments along with  the remaining paracervical and parametrial tissue was clamped with the  curved Heaney clamps, cut and suture ligated using 0 Vicryl.  The fundus was  exteriorized and the utero-ovarian ligaments identified and clamped  bilaterally with Kelly clamps, cut and tied using 0 Vicryl and then suture  ligated bilaterally using 0 Vicryl.  There was hemostasis noted at the  bilateral pedicles.  The McCall culdoplasty stitch was then  performed using  0 Vicryl and the angle stitches were placed using 0 Vicryl incorporating the  uterosacral ligaments.  The vaginal cuff was closed using 0 Vicryl  interrupted stitches and the McCall culdoplasty stitch was then tied.  There  was hemostasis noted.  The vagina was packed with Estrace-soaked plain  packing.  Sponge, lap and needle counts were correct.  The patient tolerated  the procedure well and was returned to the recovery room in good condition.      Osborn Coho, M.D.  Electronically Signed     AR/MEDQ  D:  01/01/2005  T:  01/01/2005  Job:  [id number]

## 2011-02-01 LAB — DRUGS OF ABUSE SCREEN W/O ALC, ROUTINE URINE
Benzodiazepines.: NEGATIVE
Opiate Screen, Urine: NEGATIVE
Phencyclidine (PCP): NEGATIVE
Propoxyphene: NEGATIVE

## 2011-02-01 LAB — URINALYSIS, ROUTINE W REFLEX MICROSCOPIC
Bilirubin Urine: NEGATIVE
Ketones, ur: NEGATIVE
Nitrite: NEGATIVE
Urobilinogen, UA: 0.2
pH: 6.5

## 2011-02-01 LAB — COMPREHENSIVE METABOLIC PANEL
ALT: 18
BUN: 17
Calcium: 10.1
Creatinine, Ser: 0.74
Glucose, Bld: 131 — ABNORMAL HIGH
Sodium: 136
Total Protein: 7.7

## 2011-02-01 LAB — CBC
Hemoglobin: 13.7
MCHC: 35
MCV: 94.5
RDW: 12.7

## 2011-02-01 LAB — TSH: TSH: 1.402

## 2011-02-05 LAB — PROTIME-INR
INR: 1
INR: 1
INR: 1
Prothrombin Time: 13
Prothrombin Time: 13.1

## 2011-02-05 LAB — BASIC METABOLIC PANEL
Calcium: 8.5
Calcium: 9
Chloride: 101
Chloride: 104
Chloride: 98
Creatinine, Ser: 0.64
GFR calc Af Amer: >60
GFR calc Af Amer: >60
GFR calc Af Amer: >60
GFR calc Af Amer: >60
GFR calc non Af Amer: >60
GFR calc non Af Amer: >60
Glucose, Bld: 123 — ABNORMAL HIGH
Potassium: 3.6
Potassium: 4.3
Sodium: 134 — ABNORMAL LOW
Sodium: 137
Sodium: 134 — ABNORMAL LOW

## 2011-02-05 LAB — CBC
HCT: 29.1 — ABNORMAL LOW
HCT: 36.4
Hemoglobin: 12.3
Hemoglobin: 9.7 — ABNORMAL LOW
MCV: 95.4
MCV: 96.9
MCV: 95.2
RBC: 3 — ABNORMAL LOW
RBC: 3.81 — ABNORMAL LOW
RBC: 3.14 — ABNORMAL LOW
RDW: 11.5
WBC: 11.3 — ABNORMAL HIGH
WBC: 11.6 — ABNORMAL HIGH
WBC: 12.7 — ABNORMAL HIGH
WBC: 14.3 — ABNORMAL HIGH

## 2011-02-05 LAB — ABO/RH: ABO/RH(D): O POS

## 2011-02-05 LAB — TYPE AND SCREEN

## 2011-02-10 LAB — CBC
HCT: 27.7 — ABNORMAL LOW
HCT: 37.7
Hemoglobin: 12.8
MCV: 98.9
RBC: 2.8 — ABNORMAL LOW
RBC: 3.84 — ABNORMAL LOW
WBC: 11 — ABNORMAL HIGH
WBC: 12.3 — ABNORMAL HIGH

## 2011-02-10 LAB — BASIC METABOLIC PANEL
Chloride: 103
Creatinine, Ser: 0.81
GFR calc Af Amer: >60
GFR calc non Af Amer: >60
Potassium: 4.2
Potassium: 4.7
Sodium: 136

## 2011-02-10 LAB — TYPE AND SCREEN

## 2011-09-08 ENCOUNTER — Telehealth: Payer: Self-pay

## 2011-10-21 ENCOUNTER — Ambulatory Visit (INDEPENDENT_AMBULATORY_CARE_PROVIDER_SITE_OTHER): Payer: BC Managed Care – PPO | Admitting: Internal Medicine

## 2011-10-21 VITALS — BP 118/80 | HR 92 | Temp 98.2°F | Resp 16 | Ht 62.5 in | Wt 180.0 lb

## 2011-10-21 DIAGNOSIS — F39 Unspecified mood [affective] disorder: Secondary | ICD-10-CM | POA: Insufficient documentation

## 2011-10-21 DIAGNOSIS — E789 Disorder of lipoprotein metabolism, unspecified: Secondary | ICD-10-CM

## 2011-10-21 DIAGNOSIS — E119 Type 2 diabetes mellitus without complications: Secondary | ICD-10-CM

## 2011-10-21 DIAGNOSIS — Z7189 Other specified counseling: Secondary | ICD-10-CM

## 2011-10-21 DIAGNOSIS — I10 Essential (primary) hypertension: Secondary | ICD-10-CM

## 2011-10-21 DIAGNOSIS — N390 Urinary tract infection, site not specified: Secondary | ICD-10-CM

## 2011-10-21 DIAGNOSIS — G8929 Other chronic pain: Secondary | ICD-10-CM | POA: Insufficient documentation

## 2011-10-21 LAB — POCT UA - MICROSCOPIC ONLY
Casts, Ur, LPF, POC: NEGATIVE
Crystals, Ur, HPF, POC: NEGATIVE
Yeast, UA: NEGATIVE

## 2011-10-21 LAB — COMPREHENSIVE METABOLIC PANEL
ALT: 17 U/L (ref 0–35)
AST: 19 U/L (ref 0–37)
BUN: 20 mg/dL (ref 6–23)
Creat: 0.79 mg/dL (ref 0.50–1.10)
Total Bilirubin: 0.5 mg/dL (ref 0.3–1.2)

## 2011-10-21 LAB — POCT URINALYSIS DIPSTICK
Glucose, UA: NEGATIVE
Nitrite, UA: NEGATIVE
Protein, UA: NEGATIVE
Urobilinogen, UA: 0.2
pH, UA: 6.5

## 2011-10-21 LAB — GLUCOSE, POCT (MANUAL RESULT ENTRY): POC Glucose: 72 mg/dl (ref 70–99)

## 2011-10-21 LAB — LIPID PANEL
HDL: 44 mg/dL (ref >39)
Total CHOL/HDL Ratio: 4.8 Ratio
Triglycerides: 497 mg/dL — ABNORMAL HIGH (ref <150)

## 2011-10-21 LAB — POCT CBC
MCH, POC: 30.8 pg (ref 27–31.2)
MID (cbc): 0.8 (ref 0–0.9)
MPV: 10.3 fL (ref 0–99.8)
POC MID %: 5.8 %M (ref 0–12)
Platelet Count, POC: 398 10*3/uL (ref 142–424)
RBC: 4.67 M/uL (ref 4.04–5.48)
RDW, POC: 12.6 %
WBC: 13.3 10*3/uL — AB (ref 4.6–10.2)

## 2011-10-21 LAB — POCT GLYCOSYLATED HEMOGLOBIN (HGB A1C): Hemoglobin A1C: 5.5

## 2011-10-21 MED ORDER — TRAZODONE HCL 100 MG PO TABS: 300.0000 mg | ORAL_TABLET | Freq: Every day | ORAL | Status: DC

## 2011-10-21 MED ORDER — GABAPENTIN 800 MG PO TABS: 600.0000 mg | ORAL_TABLET | Freq: Four times a day (QID) | ORAL | Status: DC

## 2011-10-21 MED ORDER — CIPROFLOXACIN HCL 500 MG PO TABS: 500.0000 mg | ORAL_TABLET | Freq: Two times a day (BID) | ORAL | Status: AC

## 2011-10-21 NOTE — Patient Instructions (Addendum)
DASH Diet The DASH diet stands for "Dietary Approaches to Stop Hypertension." It is a healthy eating plan that has been shown to reduce high blood pressure (hypertension) in as little as 14 days, while also possibly providing other significant health benefits. These other health benefits include reducing the risk of breast cancer after menopause and reducing the risk of type 2 diabetes, heart disease, colon cancer, and stroke. Health benefits also include weight loss and slowing kidney failure in patients with chronic kidney disease.  DIET GUIDELINES  Limit salt (sodium). Your diet should contain less than 1500 mg of sodium daily.   Limit refined or processed carbohydrates. Your diet should include mostly whole grains. Desserts and added sugars should be used sparingly.   Include small amounts of heart-healthy fats. These types of fats include nuts, oils, and tub margarine. Limit saturated and trans fats. These fats have been shown to be harmful in the body.  CHOOSING FOODS  The following food groups are based on a 2000 calorie diet. See your Registered Dietitian for individual calorie needs. Grains and Grain Products (6 to 8 servings daily)  Eat More Often: Whole-wheat bread, brown rice, whole-grain or wheat pasta, quinoa, popcorn without added fat or salt (air popped).   Eat Less Often: White bread, white pasta, white rice, cornbread.  Vegetables (4 to 5 servings daily)  Eat More Often: Fresh, frozen, and canned vegetables. Vegetables may be raw, steamed, roasted, or grilled with a minimal amount of fat.   Eat Less Often/Avoid: Creamed or fried vegetables. Vegetables in a cheese sauce.  Fruit (4 to 5 servings daily)  Eat More Often: All fresh, canned (in natural juice), or frozen fruits. Dried fruits without added sugar. One hundred percent fruit juice ( cup [237 mL] daily).   Eat Less Often: Dried fruits with added sugar. Canned fruit in light or heavy syrup.  [employer], Fish, and  Poultry (2 servings or less daily. One serving is 3 to 4 oz [85-114 g]).  Eat More Often: Ninety percent or leaner ground beef, tenderloin, sirloin. Round cuts of beef, chicken breast, [place] breast. All fish. Grill, bake, or broil your meat. Nothing should be fried.   Eat Less Often/Avoid: Fatty cuts of meat, [place], or chicken leg, thigh, or wing. Fried cuts of meat or fish.  Dairy (2 to 3 servings)  Eat More Often: Low-fat or fat-free milk, low-fat plain or light yogurt, reduced-fat or part-skim cheese.   Eat Less Often/Avoid: Milk (whole, 2%, skim, or chocolate).Whole milk yogurt. Full-fat cheeses.  Nuts, Seeds, and Legumes (4 to 5 servings per week)  Eat More Often: All without added salt.   Eat Less Often/Avoid: Salted nuts and seeds, canned beans with added salt.  Fats and Sweets (limited)  Eat More Often: Vegetable oils, tub margarines without trans fats, sugar-free gelatin. Mayonnaise and salad dressings.   Eat Less Often/Avoid: Coconut oils, palm oils, butter, stick margarine, cream, half and half, cookies, candy, pie.  FOR MORE INFORMATION The Dash Diet Eating Plan: www.dashdiet.org Document Released: 04/15/2011 Document Reviewed: 04/05/2011 [facility] Patient Information 2012 [place], [place].Urinary Tract Infection Infections of the urinary tract can start in several places. A bladder infection (cystitis), a kidney infection (pyelonephritis), and a prostate infection (prostatitis) are different types of urinary tract infections (UTIs). They usually get better if treated with medicines (antibiotics) that kill germs. Take all the medicine until it is gone. You or your child may feel better in a few days, but TAKE ALL MEDICINE or the infection  may not respond and may become more difficult to treat. HOME CARE INSTRUCTIONS   Drink enough water and fluids to keep the urine clear or pale yellow. Cranberry juice is especially recommended, in addition to large amounts of water.   Avoid  caffeine, tea, and carbonated beverages. They tend to irritate the bladder.   Alcohol may irritate the prostate.   Only take over-the-counter or prescription medicines for pain, discomfort, or fever as directed by your caregiver.  To prevent further infections:  Empty the bladder often. Avoid holding urine for long periods of time.   After a bowel movement, women should cleanse from front to back. Use each tissue only once.   Empty the bladder before and after sexual intercourse.  FINDING OUT THE RESULTS OF YOUR TEST Not all test results are available during your visit. If your or your child's test results are not back during the visit, make an appointment with your caregiver to find out the results. Do not assume everything is normal if you have not heard from your caregiver or the medical facility. It is important for you to follow up on all test results. SEEK MEDICAL CARE IF:   There is back pain.   Your baby is older than 3 months with a rectal temperature of 100.5 F (38.1 C) or higher for more than 1 day.   Your or your child's problems (symptoms) are no better in 3 days. Return sooner if you or your child is getting worse.  SEEK IMMEDIATE MEDICAL CARE IF:   There is severe back pain or lower abdominal pain.   You or your child develops chills.   You have a fever.   Your baby is older than 3 months with a rectal temperature of 102 F (38.9 C) or higher.   Your baby is 24 months old or younger with a rectal temperature of 100.4 F (38 C) or higher.   There is nausea or vomiting.   There is continued burning or discomfort with urination.  MAKE SURE YOU:   Understand these instructions.   Will watch your condition.   Will get help right away if you are not doing well or get worse.  Document Released: 02/03/2005 Document Revised: 04/15/2011 Document Reviewed: 09/08/2006 [facility] Patient Information 2012 [place], [place].

## 2011-10-21 NOTE — Progress Notes (Signed)
  Subjective:    Patient ID: Brittney Sawyer, female    DOB: April 27, 1966, 46 y.o.   MRN: [id number]  HPI Is trying to quit smoking. Has chronic pain and mood disorder. Ran out of meds. Has NIDDM, lost 25lbs, feels like glucose is high.   Review of Systems     Objective:   Physical Exam  Constitutional: She is oriented to person, place, and time. She appears well-nourished. No distress.  Eyes: EOM are normal. No scleral icterus.  Neck: Normal range of motion.  Cardiovascular: Normal rate, regular rhythm and normal heart sounds.   Pulmonary/Chest: Effort normal and breath sounds normal.  Abdominal: There is no tenderness.  Musculoskeletal: Normal range of motion.  Neurological: She is alert and oriented to person, place, and time. Coordination normal.  Skin: Skin is warm and dry.  Psychiatric: She has a normal mood and affect. Her behavior is normal. Judgment and thought content normal.      Results for orders placed in visit on 10/21/11  POCT CBC      Component Value Range   WBC 13.3 (*) 4.6 - 10.2 K/uL   Lymph, poc 3.3  0.6 - 3.4   POC LYMPH PERCENT 24.9  10 - 50 %L   MID (cbc) 0.8  0 - 0.9   POC MID % 5.8  0 - 12 %M   POC Granulocyte 9.2 (*) 2 - 6.9   Granulocyte percent 69.3  37 - 80 %G   RBC 4.67  4.04 - 5.48 M/uL   Hemoglobin 14.4  12.2 - 16.2 g/dL   HCT, POC 40.9  81.1 - 47.9 %   MCV 98.6 (*) 80 - 97 fL   MCH, POC 30.8  27 - 31.2 pg   MCHC 31.3 (*) 31.8 - 35.4 g/dL   RDW, POC 91.4     Platelet Count, POC 398  142 - 424 K/uL   MPV 10.3  0 - 99.8 fL  POCT URINALYSIS DIPSTICK      Component Value Range   Color, UA yellow     Clarity, UA clear     Glucose, UA neg     Bilirubin, UA neg     Ketones, UA neg     Spec Grav, UA 1.015     Blood, UA trace-intact     pH, UA 6.5     Protein, UA neg     Urobilinogen, UA 0.2     Nitrite, UA neg     Leukocytes, UA Negative    POCT UA - MICROSCOPIC ONLY      Component Value Range   WBC, Ur, HPF, POC 4-8     RBC, urine,  microscopic 2-5     Bacteria, U Microscopic 2+     Mucus, UA neg     Epithelial cells, urine per micros 3-5     Crystals, Ur, HPF, POC neg     Casts, Ur, LPF, POC neg     Yeast, UA neg    GLUCOSE, POCT (MANUAL RESULT ENTRY)      Component Value Range   POC Glucose 72  70 - 99 mg/dl  POCT GLYCOSYLATED HEMOGLOBIN (HGB A1C)      Component Value Range   Hemoglobin A1C 5.5         Assessment & Plan:  Quit smoking plan Refill needed meds Schedule cpe Dr. Cleta Alberts

## 2011-10-23 LAB — URINE CULTURE: Colony Count: 30000

## 2011-11-25 ENCOUNTER — Ambulatory Visit: Payer: BC Managed Care – PPO

## 2011-11-25 ENCOUNTER — Ambulatory Visit (INDEPENDENT_AMBULATORY_CARE_PROVIDER_SITE_OTHER): Payer: BC Managed Care – PPO | Admitting: Family Medicine

## 2011-11-25 VITALS — BP 108/74 | HR 86 | Temp 98.3°F | Resp 16 | Ht 61.5 in | Wt 186.0 lb

## 2011-11-25 DIAGNOSIS — Z720 Tobacco use: Secondary | ICD-10-CM | POA: Insufficient documentation

## 2011-11-25 DIAGNOSIS — M25561 Pain in right knee: Secondary | ICD-10-CM

## 2011-11-25 DIAGNOSIS — E669 Obesity, unspecified: Secondary | ICD-10-CM

## 2011-11-25 DIAGNOSIS — N951 Menopausal and female climacteric states: Secondary | ICD-10-CM

## 2011-11-25 DIAGNOSIS — I10 Essential (primary) hypertension: Secondary | ICD-10-CM

## 2011-11-25 DIAGNOSIS — Z780 Asymptomatic menopausal state: Secondary | ICD-10-CM | POA: Insufficient documentation

## 2011-11-25 DIAGNOSIS — Z01419 Encounter for gynecological examination (general) (routine) without abnormal findings: Secondary | ICD-10-CM

## 2011-11-25 DIAGNOSIS — E66811 Obesity, class 1: Secondary | ICD-10-CM

## 2011-11-25 DIAGNOSIS — M25569 Pain in unspecified knee: Secondary | ICD-10-CM

## 2011-11-25 DIAGNOSIS — Z0000 Encounter for general adult medical examination without abnormal findings: Secondary | ICD-10-CM

## 2011-11-25 DIAGNOSIS — M5136 Other intervertebral disc degeneration, lumbar region: Secondary | ICD-10-CM | POA: Insufficient documentation

## 2011-11-25 DIAGNOSIS — Z1231 Encounter for screening mammogram for malignant neoplasm of breast: Secondary | ICD-10-CM

## 2011-11-25 DIAGNOSIS — F17200 Nicotine dependence, unspecified, uncomplicated: Secondary | ICD-10-CM

## 2011-11-25 LAB — POCT URINALYSIS DIPSTICK
Bilirubin, UA: NEGATIVE
Blood, UA: NEGATIVE
Glucose, UA: NEGATIVE
Ketones, UA: NEGATIVE
Leukocytes, UA: NEGATIVE
Nitrite, UA: NEGATIVE
pH, UA: 7

## 2011-11-25 LAB — IFOBT (OCCULT BLOOD): IFOBT: NEGATIVE

## 2011-11-25 MED ORDER — NABUMETONE 750 MG PO TABS: 750.0000 mg | ORAL_TABLET | Freq: Two times a day (BID) | ORAL | Status: DC

## 2011-11-25 MED ORDER — LISINOPRIL-HYDROCHLOROTHIAZIDE 20-25 MG PO TABS: 1.0000 | ORAL_TABLET | Freq: Every day | ORAL | Status: DC

## 2011-11-25 MED ORDER — OMEPRAZOLE 20 MG PO CPDR: 20.0000 mg | DELAYED_RELEASE_CAPSULE | Freq: Every day | ORAL | Status: DC

## 2011-11-25 NOTE — Patient Instructions (Signed)
Smoking Cessation This document explains the best ways for you to quit smoking and new treatments to help. It lists new medicines that can double or triple your chances of quitting and quitting for good. It also considers ways to avoid relapses and concerns you may have about quitting, including weight gain. NICOTINE: A POWERFUL ADDICTION If you have tried to quit smoking, you know how hard it can be. It is hard because nicotine is a very addictive drug. For some people, it can be as addictive as heroin or cocaine. Usually, people make 2 or 3 tries, or more, before finally being able to quit. Each time you try to quit, you can learn about what helps and what hurts. Quitting takes hard work and a lot of effort, but you can quit smoking. QUITTING SMOKING IS ONE OF THE MOST IMPORTANT THINGS YOU WILL EVER DO.  You will live longer, feel better, and live better.   The impact on your body of quitting smoking is felt almost immediately:   Within 20 minutes, blood pressure decreases. Pulse returns to its normal level.   After 8 hours, carbon monoxide levels in the blood return to normal. Oxygen level increases.   After 24 hours, chance of heart attack starts to decrease. Breath, hair, and body stop smelling like smoke.   After 48 hours, damaged nerve endings begin to recover. Sense of taste and smell improve.   After 72 hours, the body is virtually free of nicotine. Bronchial tubes relax and breathing becomes easier.   After 2 to 12 weeks, lungs can hold more air. Exercise becomes easier and circulation improves.   Quitting will reduce your risk of having a heart attack, stroke, cancer, or lung disease:   After 1 year, the risk of coronary heart disease is cut in half.   After 5 years, the risk of stroke falls to the same as a nonsmoker.   After 10 years, the risk of lung cancer is cut in half and the risk of other cancers decreases significantly.   After 15 years, the risk of coronary heart  disease drops, usually to the level of a nonsmoker.   If you are pregnant, quitting smoking will improve your chances of having a healthy baby.   The people you live with, especially your children, will be healthier.   You will have extra money to spend on things other than cigarettes.  FIVE KEYS TO QUITTING Studies have shown that these 5 steps will help you quit smoking and quit for good. You have the best chances of quitting if you use them together: 1. Get ready.  2. Get support and encouragement.  3. Learn new skills and behaviors.  4. Get medicine to reduce your nicotine addiction and use it correctly.  5. Be prepared for relapse or difficult situations. Be determined to continue trying to quit, even if you do not succeed at first.  1. GET READY  Set a quit date.   Change your environment.   Get rid of ALL cigarettes, ashtrays, matches, and lighters in your home, car, and place of work.   Do not let people smoke in your home.   Review your past attempts to quit. Think about what worked and what did not.   Once you quit, do not smoke. NOT EVEN A PUFF!  2. GET SUPPORT AND ENCOURAGEMENT Studies have shown that you have a better chance of being successful if you have help. You can get support in many ways.  Tell   your family, friends, and coworkers that you are going to quit and need their support. Ask them not to smoke around you.   Talk to your caregivers (doctor, dentist, nurse, pharmacist, psychologist, and/or smoking counselor).   Get individual, group, or telephone counseling and support. The more counseling you have, the better your chances are of quitting. Programs are available at local hospitals and health centers. Call your local health department for information about programs in your area.   Spiritual beliefs and practices may help some smokers quit.   Quit meters are small computer programs online or downloadable that keep track of quit statistics, such as amount  of "quit-time," cigarettes not smoked, and money saved.   Many smokers find one or more of the many self-help books available useful in helping them quit and stay off tobacco.  3. LEARN NEW SKILLS AND BEHAVIORS  Try to distract yourself from urges to smoke. Talk to someone, go for a walk, or occupy your time with a task.   When you first try to quit, change your routine. Take a different route to work. Drink tea instead of coffee. Eat breakfast in a different place.   Do something to reduce your stress. Take a hot bath, exercise, or read a book.   Plan something enjoyable to do every day. Reward yourself for not smoking.   Explore interactive web-based programs that specialize in helping you quit.  4. GET MEDICINE AND USE IT CORRECTLY Medicines can help you stop smoking and decrease the urge to smoke. Combining medicine with the above behavioral methods and support can quadruple your chances of successfully quitting smoking. The U.[place]. Food and Drug Administration (FDA) has approved 7 medicines to help you quit smoking. These medicines fall into 3 categories.  Nicotine replacement therapy (delivers nicotine to your body without the negative effects and risks of smoking):   Nicotine gum: Available over-the-counter.   Nicotine lozenges: Available over-the-counter.   Nicotine inhaler: Available by prescription.   Nicotine nasal spray: Available by prescription.   Nicotine skin patches (transdermal): Available by prescription and over-the-counter.   Antidepressant medicine (helps people abstain from smoking, but how this works is unknown):   Bupropion sustained-release (SR) tablets: Available by prescription.   Nicotinic receptor partial agonist (simulates the effect of nicotine in your brain):   Varenicline tartrate tablets: Available by prescription.   Ask your caregiver for advice about which medicines to use and how to use them. Carefully read the information on the package.    Everyone who is trying to quit may benefit from using a medicine. If you are pregnant or trying to become pregnant, nursing an infant, you are under age 18, or you smoke fewer than 10 cigarettes per day, talk to your caregiver before taking any nicotine replacement medicines.   You should stop using a nicotine replacement product and call your caregiver if you experience nausea, dizziness, weakness, vomiting, fast or irregular heartbeat, mouth problems with the lozenge or gum, or redness or swelling of the skin around the patch that does not go away.   Do not use any other product containing nicotine while using a nicotine replacement product.   Talk to your caregiver before using these products if you have diabetes, heart disease, asthma, stomach ulcers, you had a recent heart attack, you have high blood pressure that is not controlled with medicine, a history of irregular heartbeat, or you have been prescribed medicine to help you quit smoking.  5. BE PREPARED FOR RELAPSE OR   DIFFICULT SITUATIONS  Most relapses occur within the first 3 months after quitting. Do not be discouraged if you start smoking again. Remember, most people try several times before they finally quit.   You may have symptoms of withdrawal because your body is used to nicotine. You may crave cigarettes, be irritable, feel very hungry, cough often, get headaches, or have difficulty concentrating.   The withdrawal symptoms are only temporary. They are strongest when you first quit, but they will go away within 10 to 14 days.  Here are some difficult situations to watch for:  Alcohol. Avoid drinking alcohol. Drinking lowers your chances of successfully quitting.   Caffeine. Try to reduce the amount of caffeine you consume. It also lowers your chances of successfully quitting.   Other smokers. Being around smoking can make you want to smoke. Avoid smokers.   Weight gain. Many smokers will gain weight when they quit, usually  less than 10 pounds. Eat a healthy diet and stay active. Do not let weight gain distract you from your main goal, quitting smoking. Some medicines that help you quit smoking may also help delay weight gain. You can always lose the weight gained after you quit.   Bad mood or depression. There are a lot of ways to improve your mood other than smoking.  If you are having problems with any of these situations, talk to your caregiver. SPECIAL SITUATIONS AND CONDITIONS Studies suggest that everyone can quit smoking. Your situation or condition can give you a special reason to quit.  Pregnant women/new mothers: By quitting, you protect your baby's health and your own.   Hospitalized patients: By quitting, you reduce health problems and help healing.   Heart attack patients: By quitting, you reduce your risk of a second heart attack.   Lung, head, and neck cancer patients: By quitting, you reduce your chance of a second cancer.   Parents of children and adolescents: By quitting, you protect your children from illnesses caused by secondhand smoke.  QUESTIONS TO THINK ABOUT Think about the following questions before you try to stop smoking. You may want to talk about your answers with your caregiver.  Why do you want to quit?   If you tried to quit in the past, what helped and what did not?   What will be the most difficult situations for you after you quit? How will you plan to handle them?   Who can help you through the tough times? Your family? Friends? Caregiver?   What pleasures do you get from smoking? What ways can you still get pleasure if you quit?  Here are some questions to ask your caregiver:  How can you help me to be successful at quitting?   What medicine do you think would be best for me and how should I take it?   What should I do if I need more help?   What is smoking withdrawal like? How can I get information on withdrawal?  Quitting takes hard work and a lot of effort,  but you can quit smoking. FOR MORE INFORMATION  Smokefree.gov (http://www.davis-sullivan.com/) provides free, accurate, evidence-based information and professional assistance to help support the immediate and long-term needs of people trying to quit smoking. Document Released: 04/20/2001 Document Revised: 04/15/2011 Document Reviewed: 02/10/2009 [facility] Patient Information 2012 [place], [place]    .Keeping You Healthy  Get These Tests 1. Blood Pressure- Have your blood pressure checked once a year by your health care provider.  Normal blood pressure is  120/80. 2. Weight- Have your body mass index (BMI) calculated to screen for obesity.  BMI is measure of body fat based on height and weight.  You can also calculate your own BMI at https://www.west-esparza.com/. 3. Cholesterol- Have your cholesterol checked every 5 years starting at age 27 then yearly starting at age 19. 4. Chlamydia, HIV, and other sexually transmitted diseases- Get screened every year until age 29, then within three months of each new sexual provider. 5. Pap Smear- Every 1-3 years; discuss with your health care provider. 6. Mammogram- Every year starting at age 63  Take these medicines  Calcium with Vitamin D-Your body needs 1200 mg of Calcium each day and [phone] IU of Vitamin D daily.  Your body can only absorb 500 mg of Calcium at a time so Calcium must be taken in 2 or 3 divided doses throughout the day.  Multivitamin with folic acid- Once daily if it is possible for you to become pregnant.  Get these Immunizations  Gardasil-Series of three doses; prevents HPV related illness such as genital warts and cervical cancer.  Menactra-Single dose; prevents meningitis.  Tetanus shot- Every 10 years.  Flu shot-Every year.  Take these steps 1. Do not smoke-Your healthcare provider can help you quit.  For tips on how to quit go to www.smokefree.gov or call 1-800 QUITNOW. 2. Be physically active- Exercise 5 days a week for at  least 30 minutes.  If you are not already physically active, start slow and gradually work up to 30 minutes of moderate physical activity.  Examples of moderate activity include walking briskly, dancing, swimming, bicycling, etc. 3. Breast Cancer- A self breast exam every month is important for early detection of breast cancer.  For more information and instruction on self breast exams, ask your healthcare provider or [email or web address].es. 4. Eat a healthy diet- Eat a variety of healthy foods such as fruits, vegetables, whole grains, low fat milk, low fat cheeses, yogurt, lean meats, poultry and fish, beans, nuts, tofu, etc.  For more information go to www. Thenutritionsource.org 5. Drink alcohol in moderation- Limit alcohol intake to one drink or less per day. Never drink and drive. 6. Depression- Your emotional health is as important as your physical health.  If you're feeling down or losing interest in things you normally enjoy please talk to your healthcare provider about being screened for depression. 7. Dental visit- Brush and floss your teeth twice daily; visit your dentist twice a year. 8. Eye doctor- Get an eye exam at least every 2 years. 9. Helmet use- Always wear a helmet when riding a bicycle, motorcycle, rollerblading or skateboarding. 10. Safe sex- If you may be exposed to sexually transmitted infections, use a condom. 11. Seat belts- Seat belts can save your live; always wear one. 12. Smoke/Carbon Monoxide detectors- These detectors need to be installed on the appropriate level of your home. Replace batteries at least once a year. 13. Skin cancer- When out in the sun please cover up and use sunscreen 15 SPF or higher. 14. Violence- If anyone is threatening or hurting you, please tell your healthcare provider.  Menopause Menopause is the normal time of life when menstrual periods stop completely. Menopause is complete when you have missed 12 consecutive  menstrual periods. It usually occurs between the ages of 12 to 55, with an average age of 8. Very rarely does a woman develop menopause before 47 years old. At menopause, your ovaries stop producing the female hormones, estrogen and progesterone. This can cause  undesirable symptoms and also affect your health. Sometimes the symptoms may occur 4 to 5 years before the menopause begins. There is no relationship between menopause and:  Oral contraceptives.   Number of children you had.   Race.   The age your menstrual periods started (menarche).  Heavy smokers and very thin women may develop menopause earlier in life. CAUSES  The ovaries stop producing the female hormones estrogen and progesterone.   Other causes include:   Surgery to remove both ovaries.   The ovaries stop functioning for no known reason.   Tumors of the pituitary gland in the brain.   Medical disease that affects the ovaries and hormone production.   Radiation treatment to the abdomen or pelvis.   Chemotherapy that affects the ovaries.  SYMPTOMS   Hot flashes.   Night sweats.   Decrease in sex drive.   Vaginal dryness and thinning of the vagina causing painful intercourse.   Dryness of the skin and developing wrinkles.   Headaches.   Tiredness.   Irritability.   Memory problems.   Weight gain.   Bladder infections.   Hair growth of the face and chest.   Infertility.  More serious symptoms include:  Loss of bone (osteoporosis) causing breaks (fractures).   Depression.   Hardening and narrowing of the arteries (atherosclerosis) causing heart attacks and strokes.  DIAGNOSIS   When the menstrual periods have stopped for 12 straight months.   Physical exam.   Hormone studies of the blood.  TREATMENT  There are many treatment choices and nearly as many questions about them. The decisions to treat or not to treat menopausal changes is an individual choice made with your caregiver. Your  caregiver can discuss the treatments with you. Together, you can decide which treatment will work best for you. Your treatment choices may include:   Hormone therapy (estorgen and progesterone).   Non-hormonal medications.   Treating the individual symptoms with medication (for example antidepressants for depression).   Herbal medications that may help specific symptoms.   Counseling by a psychiatrist or psychologist.   Group therapy.   Lifestyle changes including:   Eating healthy.   Regular exercise.   Limiting caffeine and alcohol.   Stress management and meditation.   No treatment.  HOME CARE INSTRUCTIONS   Take the medication your caregiver gives you as directed.   Get plenty of sleep and rest.   Exercise regularly.   Eat a diet that contains calcium (good for the bones) and soy products (acts like estrogen hormone).   Avoid alcoholic beverages.   Do not smoke.   If you have hot flashes, dress in layers.   Take supplements, calcium and vitamin D to strengthen bones.   You can use over-the-counter lubricants or moisturizers for vaginal dryness.   Group therapy is sometimes very helpful.   Acupuncture may be helpful in some cases.  SEEK MEDICAL CARE IF:   You are not sure you are in menopause.   You are having menopausal symptoms and need advice and treatment.   You are still having menstrual periods after age 19.   You have pain with intercourse.   Menopause is complete (no menstrual period for 12 months) and you develop vaginal bleeding.   You need a referral to a specialist (gynecologist, psychiatrist or psychologist) for treatment.  SEEK IMMEDIATE MEDICAL CARE IF:   You have severe depression.   You have excessive vaginal bleeding.   You fell and think you have a  broken bone.   You have pain when you urinate.   You develop leg or chest pain.   You have a fast pounding heart beat (palpitations).   You have severe headaches.   You  develop vision problems.   You feel a lump in your breast.   You have abdominal pain or severe indigestion.  Document Released: 07/17/2003 Document Revised: 04/15/2011 Document Reviewed: 02/22/2008 [facility] Patient Information 2012 [place], [place].  FLAX SEED OIL CAPSULE 1200 mg daily may help reduce menopause symptoms.

## 2011-11-25 NOTE — Progress Notes (Signed)
Subjective:    Patient ID: Brittney Sawyer, female    DOB: 1966-03-29, 46 y.o.   MRN: [id number]  HPI This 46 y.o. Cauc female is here for CPE and PAP (s/p TAH in 2006). She takes medication for HTN ,  chronic depression and chronic pain with neuropathy. She is an everyday smoker (not ready to quit) but  does not drink alcohol. She is currently not working. She exercises 4x/wk for ~2 hours.    She is concerned about symptoms of menopause and has some smoking related cough and wheezing.   She also has chronic right knee pain with swelling and "catching". Denies recent injury and wears a brace  and takes Ibuprofen with minimal relief.  Review of Systems  Constitutional: Negative.   HENT: Positive for hearing loss, dental problem and postnasal drip. Negative for ear pain, congestion, sore throat, rhinorrhea, sneezing, trouble swallowing, neck pain, voice change, sinus pressure and tinnitus.   Eyes: Negative.   Respiratory: Positive for cough and wheezing. Negative for chest tightness and shortness of breath.   Cardiovascular: Negative for chest pain, palpitations and leg swelling.  Gastrointestinal: Negative.   Musculoskeletal: Positive for back pain, arthralgias and gait problem.  All other systems reviewed and are negative.       Objective:   Physical Exam  Nursing note and vitals reviewed. Constitutional: She is oriented to person, place, and time. She appears well-developed and well-nourished. No distress.  HENT:  Head: Normocephalic and atraumatic.  Right Ear: Hearing, tympanic membrane, external ear and ear canal normal.  Left Ear: Hearing, tympanic membrane, external ear and ear canal normal.  Nose: Nose normal. No nasal deformity or septal deviation.  Mouth/Throat: Uvula is midline, oropharynx is clear and moist and mucous membranes are normal. No oral lesions. Normal dentition.  Eyes: Conjunctivae, EOM and lids are normal. Pupils are equal, round, and reactive to light. Right  eye exhibits no discharge. Left eye exhibits no discharge. Right conjunctiva has no hemorrhage. Left conjunctiva has no hemorrhage. No scleral icterus.  Fundoscopic exam:      The right eye shows arteriolar narrowing. The right eye shows no papilledema. The right eye shows red reflex.      The left eye shows arteriolar narrowing. The left eye shows no papilledema. The left eye shows red reflex. Neck: Normal range of motion. Neck supple. No thyromegaly present.  Cardiovascular: Normal rate, regular rhythm, normal heart sounds and intact distal pulses.  Exam reveals no gallop and no friction rub.   No murmur heard. Pulmonary/Chest: Effort normal and breath sounds normal. No respiratory distress. She has no wheezes. Right breast exhibits no inverted nipple, no mass, no nipple discharge, no skin change and no tenderness. Left breast exhibits no inverted nipple, no mass, no nipple discharge, no skin change and no tenderness. Breasts are symmetrical.       Decreased BS at bases  Abdominal: Soft. Bowel sounds are normal. She exhibits no distension, no abdominal bruit, no pulsatile midline mass and no mass. There is no hepatosplenomegaly. There is no tenderness. There is no rebound, no guarding and no CVA tenderness.  Genitourinary: Rectum normal and vagina normal. Rectal exam shows no external hemorrhoid, no mass, no tenderness and anal tone normal. Guaiac negative stool. There is no rash, tenderness or lesion on the right labia. There is no rash, tenderness or lesion on the left labia. Right adnexum displays no mass and no tenderness. Left adnexum displays no mass and no tenderness. No erythema, tenderness or  bleeding around the vagina. No vaginal discharge found.       Vaginal cuff intact; cervix and uterus absent. No abnormalities noted.  Musculoskeletal: She exhibits no edema.       Right knee: She exhibits decreased range of motion and bony tenderness. She exhibits no swelling, no effusion, no deformity,  no erythema, normal alignment and no LCL laxity. tenderness found. Medial joint line and lateral joint line tenderness noted. No patellar tendon tenderness noted.  Lymphadenopathy:    She has no cervical adenopathy.       Right: No inguinal adenopathy present.       Left: No inguinal adenopathy present.  Neurological: She is alert and oriented to person, place, and time. She has normal reflexes. No cranial nerve deficit. She exhibits normal muscle tone. Coordination normal.  Skin: Skin is warm and dry. No rash noted. No erythema.  Psychiatric: She has a normal mood and affect. Her behavior is normal. Judgment and thought content normal.     [facility] reading (PRIMARY) by  Dr. Audria Nine: Degenerative changes with loss of joint space                                                                             and spurring noted. No fracture or dislocation.      Assessment & Plan:   1. Routine general medical examination at a health care facility    2. Encounter for routine pelvic examination  Pap IG w/ reflex to HPV when ASC-U, IFOBT POC (occult bld, rslt in office)  3. Knee pain, right  DG Knee 1-2 Views Right RX: Nabumetone 750 mg 1 tab bid with meals Continue wearing knee support RF: Omeprazole  4. HTN, goal below 130/80  RF: Lisinopril-HCTZ 20-25 mg 1 tab daily  5. Tobacco user  Encouraged cessation or at least reducing amount smoked per day  6. Menopause syndrome  Reviewed menopausal symptoms  7. Obesity, Class I, BMI 30.0-34.9 (see actual BMI)  Weight reduction would help minimize menopause symptoms  8. Other screening mammogram  MM Digital Screening

## 2011-11-29 ENCOUNTER — Telehealth: Payer: Self-pay

## 2011-11-29 LAB — PAP IG W/ RFLX HPV ASCU

## 2011-11-29 NOTE — Progress Notes (Signed)
Quick Note:  Notify pt of Normal results. ______ 

## 2011-11-29 NOTE — Telephone Encounter (Signed)
The patient called to request referral from Dr. Audria Nine for an MRI as they previously discussed, and to have a mild pain reliever called into the pharmacy because she injured the knee yesterday and would like a medication that will give pain relief until the antiinflammatory medicine starts to work.  Please call patient at [phone]

## 2011-11-30 ENCOUNTER — Encounter: Payer: Self-pay | Admitting: Family Medicine

## 2011-11-30 ENCOUNTER — Other Ambulatory Visit: Payer: Self-pay | Admitting: Family Medicine

## 2011-11-30 DIAGNOSIS — M25569 Pain in unspecified knee: Secondary | ICD-10-CM

## 2011-11-30 DIAGNOSIS — G8929 Other chronic pain: Secondary | ICD-10-CM

## 2011-11-30 MED ORDER — TRAMADOL HCL 50 MG PO TABS: 50.0000 mg | ORAL_TABLET | Freq: Three times a day (TID) | ORAL | Status: AC | PRN

## 2011-11-30 NOTE — Telephone Encounter (Signed)
I spoke with the pt and will route Tramadol 50 mg 1 tab every 8 hours prn pain to her pharmacy. An MRI of right knee will be ordered but she will not be available until after Dec 20, 2011 because she is going on vacation.

## 2011-12-02 ENCOUNTER — Telehealth: Payer: Self-pay

## 2011-12-02 NOTE — Telephone Encounter (Signed)
Pt states that Dr.Mcpherson prescribed her tramadol but it is not working for her knee pain and it is making her nauseated with and without food. Pt would like to have something else called in. Best# [phone] PHARMACY: [facility] [address]

## 2011-12-05 ENCOUNTER — Other Ambulatory Visit: Payer: Self-pay | Admitting: Family Medicine

## 2011-12-05 MED ORDER — HYDROCODONE-ACETAMINOPHEN 5-500 MG PO TABS: ORAL_TABLET | ORAL | Status: DC

## 2011-12-05 NOTE — Progress Notes (Signed)
I reviewed phone note re: pt's intolerance of Tramadol and called RX; HC-APAP 5-500 mg #30 (0RF)  [facility] [address]

## 2011-12-09 NOTE — Telephone Encounter (Signed)
I spoke with the pt last week and changed her pain medication to Hydrocodone-APAP on 12/05/2011.

## 2011-12-27 ENCOUNTER — Telehealth: Payer: Self-pay

## 2011-12-27 NOTE — Telephone Encounter (Signed)
I have called her and she has not heard back about the MRI yet, I was going to give her the # for [facility], but she is driving. I told her when I call her back about the Hydrocodone I can give her the number to [facility] , information has been sent there for the scan. Please advise on Hydrocodone renewal.

## 2011-12-27 NOTE — Telephone Encounter (Signed)
Pt had a prescription filled in [place] for Hydrocodone by Dr. Dow Adolph  She is [facility] [address]  [facility] [phone]

## 2011-12-27 NOTE — Telephone Encounter (Signed)
I have called patient she is to have MRI done, I have left message for her to call me back about this.

## 2011-12-28 ENCOUNTER — Telehealth: Payer: Self-pay

## 2011-12-28 MED ORDER — HYDROCODONE-ACETAMINOPHEN 5-500 MG PO TABS: ORAL_TABLET | ORAL | Status: DC

## 2011-12-28 NOTE — Telephone Encounter (Signed)
Pt just wanted to let us know she is scheduled for an MRI on Aug 24th.

## 2011-12-28 NOTE — Telephone Encounter (Signed)
[facility] [address] and notified pt. Also gave her # for [facility] and she is going to call to check on her MRI

## 2011-12-28 NOTE — Telephone Encounter (Signed)
Amy notified

## 2011-12-28 NOTE — Telephone Encounter (Signed)
rx printed

## 2011-12-31 ENCOUNTER — Telehealth: Payer: Self-pay

## 2011-12-31 NOTE — Telephone Encounter (Signed)
To MD to advise.  

## 2011-12-31 NOTE — Telephone Encounter (Signed)
PATIENT STATES SHE SAW DR. Audria Nine ABOUT A MONTH AGO FOR PAIN IN HER (R) KNEE. SHE GAVE HER HYDROCODONE FOR THE PAIN, HOWEVER, IT IS NOT HELPING HER NOW. SHE WORKS ON HER FEET ALL DAY AND THE PAIN IS REALLY GETTING WORSE. SHE WOULD LIKE TO HAVE SOMETHING STRONGER PLEASE. SHE WAS FINALLY ABLE TO GET AN MRI THAT IS SCHEDULED FOR SAT. 01/01/12 AT 4:15PM AT [facility]. BEST PHONE [phone] (CELL)   PHARMACY CHOICE IS [facility] ON [address].   MBC

## 2011-12-31 NOTE — Telephone Encounter (Signed)
I cannot prescribe anything stronger at this time without knowing what is causing the pain. I will review her MRI results as soon as I can and advise her of the next step.

## 2012-01-01 ENCOUNTER — Inpatient Hospital Stay: Admission: RE | Admit: 2012-01-01 | Payer: Self-pay | Source: Ambulatory Visit

## 2012-01-02 ENCOUNTER — Encounter (HOSPITAL_COMMUNITY): Payer: Self-pay | Admitting: Emergency Medicine

## 2012-01-02 ENCOUNTER — Emergency Department (HOSPITAL_COMMUNITY)
Admission: EM | Admit: 2012-01-02 | Discharge: 2012-01-03 | Disposition: A | Discharge status: Home / Self Care | Payer: BC Managed Care – PPO | Attending: Emergency Medicine | Admitting: Emergency Medicine

## 2012-01-02 DIAGNOSIS — F3289 Other specified depressive episodes: Secondary | ICD-10-CM | POA: Insufficient documentation

## 2012-01-02 DIAGNOSIS — F329 Major depressive disorder, single episode, unspecified: Secondary | ICD-10-CM | POA: Insufficient documentation

## 2012-01-02 DIAGNOSIS — I10 Essential (primary) hypertension: Secondary | ICD-10-CM | POA: Insufficient documentation

## 2012-01-02 DIAGNOSIS — E119 Type 2 diabetes mellitus without complications: Secondary | ICD-10-CM | POA: Insufficient documentation

## 2012-01-02 DIAGNOSIS — F1920 Other psychoactive substance dependence, uncomplicated: Secondary | ICD-10-CM

## 2012-01-02 LAB — CBC WITH DIFFERENTIAL/PLATELET
Basophils Relative: 0 % (ref 0–1)
HCT: 40.3 % (ref 36.0–46.0)
Hemoglobin: 14.4 g/dL (ref 12.0–15.0)
Lymphs Abs: 2.8 10*3/uL (ref 0.7–4.0)
MCH: 33.6 pg (ref 26.0–34.0)
MCHC: 35.7 g/dL (ref 30.0–36.0)
Monocytes Absolute: 1 10*3/uL (ref 0.1–1.0)
Monocytes Relative: 7 % (ref 3–12)
Neutro Abs: 10.9 10*3/uL — ABNORMAL HIGH (ref 1.7–7.7)
Neutrophils Relative %: 73 % (ref 43–77)
RBC: 4.29 MIL/uL (ref 3.87–5.11)

## 2012-01-02 LAB — RAPID URINE DRUG SCREEN, HOSP PERFORMED
Cocaine: NOT DETECTED
Opiates: NOT DETECTED

## 2012-01-02 LAB — COMPREHENSIVE METABOLIC PANEL
Alkaline Phosphatase: 78 U/L (ref 39–117)
BUN: 20 mg/dL (ref 6–23)
Chloride: 97 mEq/L (ref 96–112)
Creatinine, Ser: 0.77 mg/dL (ref 0.50–1.10)
GFR calc Af Amer: >90 mL/min (ref >90)
GFR calc non Af Amer: >90 mL/min (ref >90)
Glucose, Bld: 121 mg/dL — ABNORMAL HIGH (ref 70–99)
Potassium: 3.9 mEq/L (ref 3.5–5.1)
Total Bilirubin: 0.3 mg/dL (ref 0.3–1.2)

## 2012-01-02 LAB — POCT PREGNANCY, URINE: Preg Test, Ur: NEGATIVE

## 2012-01-02 LAB — ACETAMINOPHEN LEVEL: Acetaminophen (Tylenol), Serum: <15 ug/mL (ref 10–30)

## 2012-01-02 MED ORDER — LOPERAMIDE HCL 2 MG PO CAPS
ORAL_CAPSULE | ORAL | Status: AC
  Administered 2012-01-02: 2 mg
  Filled 2012-01-02: qty 1

## 2012-01-02 MED ORDER — PANTOPRAZOLE SODIUM 40 MG PO TBEC: 40.0000 mg | DELAYED_RELEASE_TABLET | Freq: Every day | ORAL | Status: DC

## 2012-01-02 MED ORDER — HYDROCHLOROTHIAZIDE 25 MG PO TABS
25.0000 mg | ORAL_TABLET | Freq: Every day | ORAL | Status: DC
  Filled 2012-01-02 (×2): qty 1

## 2012-01-02 MED ORDER — ZOLPIDEM TARTRATE 5 MG PO TABS: 5.0000 mg | ORAL_TABLET | Freq: Every evening | ORAL | Status: DC | PRN

## 2012-01-02 MED ORDER — IBUPROFEN 800 MG PO TABS
800.0000 mg | ORAL_TABLET | Freq: Four times a day (QID) | ORAL | Status: DC | PRN
  Administered 2012-01-02: 800 mg via ORAL
  Filled 2012-01-02: qty 1

## 2012-01-02 MED ORDER — DULOXETINE HCL 60 MG PO CPEP
60.0000 mg | ORAL_CAPSULE | Freq: Two times a day (BID) | ORAL | Status: DC
  Administered 2012-01-02: 60 mg via ORAL
  Filled 2012-01-02 (×3): qty 1

## 2012-01-02 MED ORDER — GABAPENTIN 600 MG PO TABS
600.0000 mg | ORAL_TABLET | Freq: Four times a day (QID) | ORAL | Status: DC
  Filled 2012-01-02 (×2): qty 1

## 2012-01-02 MED ORDER — LOPERAMIDE HCL 2 MG PO CAPS: 2.0000 mg | ORAL_CAPSULE | ORAL | Status: DC | PRN

## 2012-01-02 MED ORDER — ONDANSETRON HCL 4 MG PO TABS: 4.0000 mg | ORAL_TABLET | Freq: Three times a day (TID) | ORAL | Status: DC | PRN

## 2012-01-02 MED ORDER — ACETAMINOPHEN 325 MG PO TABS
650.0000 mg | ORAL_TABLET | Freq: Four times a day (QID) | ORAL | Status: DC | PRN
  Filled 2012-01-02: qty 2

## 2012-01-02 MED ORDER — LORAZEPAM 1 MG PO TABS
1.0000 mg | ORAL_TABLET | Freq: Three times a day (TID) | ORAL | Status: DC | PRN
  Administered 2012-01-02: 1 mg via ORAL
  Filled 2012-01-02: qty 1

## 2012-01-02 MED ORDER — GABAPENTIN 300 MG PO CAPS
600.0000 mg | ORAL_CAPSULE | Freq: Four times a day (QID) | ORAL | Status: DC
  Administered 2012-01-02 (×2): 600 mg via ORAL
  Filled 2012-01-02 (×6): qty 2

## 2012-01-02 MED ORDER — LISINOPRIL-HYDROCHLOROTHIAZIDE 20-25 MG PO TABS: 1.0000 | ORAL_TABLET | Freq: Every day | ORAL | Status: DC

## 2012-01-02 MED ORDER — ALUM & MAG HYDROXIDE-SIMETH 200-200-20 MG/5ML PO SUSP: 30.0000 mL | ORAL | Status: DC | PRN

## 2012-01-02 MED ORDER — LISINOPRIL 20 MG PO TABS
20.0000 mg | ORAL_TABLET | Freq: Every day | ORAL | Status: DC
  Filled 2012-01-02 (×2): qty 1

## 2012-01-02 MED ORDER — TRAZODONE HCL 100 MG PO TABS
300.0000 mg | ORAL_TABLET | Freq: Every day | ORAL | Status: DC
  Administered 2012-01-02: 300 mg via ORAL
  Filled 2012-01-02: qty 3

## 2012-01-02 MED ORDER — NICOTINE 21 MG/24HR TD PT24
21.0000 mg | MEDICATED_PATCH | Freq: Every day | TRANSDERMAL | Status: DC
  Administered 2012-01-02: 21 mg via TRANSDERMAL
  Filled 2012-01-02: qty 1
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
## 2012-01-02 NOTE — ED Notes (Signed)
Pt belongings taken to car by husband

## 2012-01-02 NOTE — ED Notes (Signed)
Using/abusing opiates for previous 8 mos

## 2012-01-02 NOTE — ED Provider Notes (Signed)
History     CSN: [id number]  Arrival date & time 01/02/12  1359   First MD Initiated Contact with Patient 01/02/12 1555      Chief Complaint  Patient presents with  . Medical Clearance    (Consider location/radiation/quality/duration/timing/severity/associated sxs/prior treatment) HPI  46 year old female with history of anxiety, depression and substance abuse presents for detox. Patient was encouraged by her husband to come to the ER to seek for help. She has a history of neuropathy and chronic pain for which she takes a large amount of opiates. She also admits to alcohol abuse. Last alcohol and opiate use was yesterday. She denies any SI/HI. She denies auditory or visual hallucination. She denies any new pain. Her husband is at bedside and expressed that he worries for her health afraid that she may overdose. Patient is actively seeking for help.  Past Medical History  Diagnosis Date  . Depression   . Anxiety   . Hyperlipidemia   . Hypertension   . Diabetes mellitus   . Substance abuse     Alcohol - sober since 2006    Past Surgical History  Procedure Date  . Abdominal hysterectomy   . Spine surgery     Family History  Problem Relation Age of Onset  . Thyroid disease Mother   . Hypertension Father     History  Substance Use Topics  . Smoking status: Current Everyday Smoker -- 0.5 packs/day    Types: Cigarettes  . Smokeless tobacco: Never Used  . Alcohol Use: Yes     consumed ETOH previous couple days.Pt is a recovering alcoholic and has been sober since ~2006.    OB History    Grav Para Term Preterm Abortions TAB SAB Ect Mult Living                  Review of Systems  All other systems reviewed and are negative.    Allergies  Celebrex and Wellbutrin  Home Medications   Current Outpatient Rx  Name Route Sig Dispense Refill  . DULOXETINE HCL 60 MG PO CPEP Oral Take 60 mg by mouth 2 (two) times daily.     Marland Kitchen GABAPENTIN 800 MG PO TABS Oral Take 600 mg  by mouth 4 (four) times daily.    Marland Kitchen HYDROCODONE-ACETAMINOPHEN 10-650 MG PO TABS Oral Take 1 tablet by mouth every 6 (six) hours as needed. Pain    . IBUPROFEN 600 MG PO TABS Oral Take 600 mg by mouth every 6 (six) hours as needed. Pain    . LISINOPRIL-HYDROCHLOROTHIAZIDE 20-25 MG PO TABS Oral Take 1 tablet by mouth daily.    Marland Kitchen OMEPRAZOLE 20 MG PO CPDR Oral Take 20 mg by mouth daily.    . TRAZODONE HCL 100 MG PO TABS Oral Take 300 mg by mouth at bedtime.      BP 117/74  Pulse 116  Temp 98.4 F (36.9 C) (Oral)  Resp 18  Wt 180 lb (81.647 kg)  SpO2 94%  Physical Exam  Nursing note and vitals reviewed. Constitutional: She is oriented to person, place, and time. She appears well-developed and well-nourished. No distress.       Awake, alert, nontoxic appearance  HENT:  Head: Atraumatic.  Mouth/Throat: Oropharynx is clear and moist.  Eyes: Conjunctivae and EOM are normal. Pupils are equal, round, and reactive to light. Right eye exhibits no discharge. Left eye exhibits no discharge.  Neck: Neck supple.  Cardiovascular: Normal rate and regular rhythm.   Pulmonary/Chest: Effort normal.  No respiratory distress. She exhibits no tenderness.  Abdominal: Soft. There is no tenderness. There is no rebound.       Hypoactive bowel sounds  Musculoskeletal: She exhibits no tenderness.       ROM appears intact, no obvious focal weakness  Neurological: She is alert and oriented to person, place, and time. She has normal strength. Gait normal. GCS eye subscore is 4. GCS verbal subscore is 5. GCS motor subscore is 6.       Mental status and motor strength appears intact  Skin: No rash noted.  Psychiatric: Her speech is normal. She exhibits a depressed mood. She expresses no homicidal and no suicidal ideation.    ED Course  Procedures (including critical care time)   Labs Reviewed  CBC WITH DIFFERENTIAL  COMPREHENSIVE METABOLIC PANEL  URINE RAPID DRUG SCREEN (HOSP PERFORMED)  ETHANOL    ACETAMINOPHEN LEVEL   No results found.   No diagnosis found.  Results for orders placed during the hospital encounter of 01/02/12  CBC WITH DIFFERENTIAL      Component Value Range   WBC 15.0 (*) 4.0 - 10.5 K/uL   RBC 4.29  3.87 - 5.11 MIL/uL   Hemoglobin 14.4  12.0 - 15.0 g/dL   HCT 40.9  81.1 - 91.4 %   MCV 93.9  78.0 - 100.0 fL   MCH 33.6  26.0 - 34.0 pg   MCHC 35.7  30.0 - 36.0 g/dL   RDW 78.2  95.6 - 21.3 %   Platelets 375  150 - 400 K/uL   Neutrophils Relative 73  43 - 77 %   Neutro Abs 10.9 (*) 1.7 - 7.7 K/uL   Lymphocytes Relative 19  12 - 46 %   Lymphs Abs 2.8  0.7 - 4.0 K/uL   Monocytes Relative 7  3 - 12 %   Monocytes Absolute 1.0  0.1 - 1.0 K/uL   Eosinophils Relative 2  0 - 5 %   Eosinophils Absolute 0.3  0.0 - 0.7 K/uL   Basophils Relative 0  0 - 1 %   Basophils Absolute 0.1  0.0 - 0.1 K/uL  COMPREHENSIVE METABOLIC PANEL      Component Value Range   Sodium 134 (*) 135 - 145 mEq/L   Potassium 3.9  3.5 - 5.1 mEq/L   Chloride 97  96 - 112 mEq/L   CO2 23  19 - 32 mEq/L   Glucose, Bld 121 (*) 70 - 99 mg/dL   BUN 20  6 - 23 mg/dL   Creatinine, Ser 0.86  0.50 - 1.10 mg/dL   Calcium 57.8  8.4 - 46.9 mg/dL   Total Protein 7.8  6.0 - 8.3 g/dL   Albumin 4.4  3.5 - 5.2 g/dL   AST 17  0 - 37 U/L   ALT 17  0 - 35 U/L   Alkaline Phosphatase 78  39 - 117 U/L   Total Bilirubin 0.3  0.3 - 1.2 mg/dL   GFR calc non Af Amer >90  >90 mL/min   GFR calc Af Amer >90  >90 mL/min  URINE RAPID DRUG SCREEN (HOSP PERFORMED)      Component Value Range   Opiates NONE DETECTED  NONE DETECTED   Cocaine NONE DETECTED  NONE DETECTED   Benzodiazepines NONE DETECTED  NONE DETECTED   Amphetamines POSITIVE (*) NONE DETECTED   Tetrahydrocannabinol NONE DETECTED  NONE DETECTED   Barbiturates NONE DETECTED  NONE DETECTED  ETHANOL  Component Value Range   Alcohol, Ethyl (B) <11  0 - 11 mg/dL  ACETAMINOPHEN LEVEL      Component Value Range   Acetaminophen (Tylenol), Serum <15.0  10  - 30 ug/mL  POCT PREGNANCY, URINE      Component Value Range   Preg Test, Ur NEGATIVE  NEGATIVE   No results found.  1. Drug detox  MDM  Patient and family member is here to request for detox from opiates use.  4:09 PM Pt is medically cleared.  Psych hold ordered, med rec ordered.  Has leukocytosis, not off from her baseline, likely secondary to stress.  No source of infection on exam.   11:28 PM [facility] ACT team has been consulted, has seen pt and has formulate plan      Fayrene Helper, PA-C 01/02/12 2328

## 2012-01-03 NOTE — ED Provider Notes (Signed)
discussed with behavioral health assessment team, patient stable for discharge and has been accepted to outpatient therapy at the [facility].  Vida Roller, MD 01/03/12 [phone]

## 2012-01-03 NOTE — BHH Counselor (Signed)
Writer spoke with patient re: follow up plans after d/c as she is no longer detoxing. Pt indicates that she and her husband plan to go to the [facility] upon d/c this am and enroll her in CD-IOP group. Pt denies SI and HI and no psychotic symptoms present.

## 2012-01-03 NOTE — Telephone Encounter (Signed)
Called patient did not get answer will try again later.

## 2012-01-03 NOTE — ED Provider Notes (Signed)
Medical screening examination/treatment/procedure(s) were performed by non-physician practitioner and as supervising physician I was immediately available for consultation/collaboration.  Ronelle Michie, Brittney Sawyer   Brittney Sawyer Pualani Borah, MD 01/03/12 0130 

## 2012-01-03 NOTE — ED Notes (Signed)
Report received-airway intact-no s/s's of distress-will continue to monitor 

## 2012-01-06 NOTE — Telephone Encounter (Signed)
Spoke with patient and advised below.  Patient states that she has been unable to have MRI due to going through drug rehab.  She admits to abusing narcotics, and would like it put on her record to not be given these in the future.  Patient states she is going through a hard time with this now, but plans to get MRI after completely recovered.  I wished her well in this.  To MD, FYI.

## 2012-01-07 ENCOUNTER — Encounter: Payer: Self-pay | Admitting: Family Medicine

## 2012-01-07 DIAGNOSIS — Z87898 Personal history of other specified conditions: Secondary | ICD-10-CM | POA: Insufficient documentation

## 2012-01-07 NOTE — Telephone Encounter (Signed)
FYI appreciated; I have noted this on problem list.

## 2012-05-05 ENCOUNTER — Other Ambulatory Visit: Payer: Self-pay | Admitting: Internal Medicine

## 2012-05-12 ENCOUNTER — Other Ambulatory Visit: Payer: Self-pay | Admitting: Internal Medicine

## 2012-05-12 NOTE — Telephone Encounter (Signed)
Needs OV.  

## 2012-05-29 ENCOUNTER — Telehealth: Payer: Self-pay

## 2012-05-29 MED ORDER — DULOXETINE HCL 60 MG PO CPEP: 60.0000 mg | ORAL_CAPSULE | Freq: Two times a day (BID) | ORAL | Status: DC

## 2012-05-29 NOTE — Telephone Encounter (Signed)
[facility] [address] -   Dr. Perrin Maltese told patient he would write this for patient  Simbalta 60 mg   [phone]

## 2012-05-29 NOTE — Telephone Encounter (Signed)
Done

## 2012-05-29 NOTE — Telephone Encounter (Signed)
Called patient to advise  °

## 2012-05-29 NOTE — Telephone Encounter (Signed)
Please advise on Cymbalta refill, patient due for follow up she advised she can come in probably next week. She is aware she needs to be seen. Pended Rx

## 2012-06-06 ENCOUNTER — Ambulatory Visit (INDEPENDENT_AMBULATORY_CARE_PROVIDER_SITE_OTHER): Payer: BC Managed Care – PPO | Admitting: Family Medicine

## 2012-06-06 VITALS — BP 101/71 | HR 96 | Temp 98.3°F | Resp 18 | Ht 61.5 in | Wt 195.6 lb

## 2012-06-06 DIAGNOSIS — F3289 Other specified depressive episodes: Secondary | ICD-10-CM

## 2012-06-06 DIAGNOSIS — F32A Depression, unspecified: Secondary | ICD-10-CM

## 2012-06-06 DIAGNOSIS — F329 Major depressive disorder, single episode, unspecified: Secondary | ICD-10-CM

## 2012-06-06 DIAGNOSIS — I10 Essential (primary) hypertension: Secondary | ICD-10-CM

## 2012-06-06 DIAGNOSIS — J329 Chronic sinusitis, unspecified: Secondary | ICD-10-CM

## 2012-06-06 MED ORDER — TRAZODONE HCL 100 MG PO TABS: 300.0000 mg | ORAL_TABLET | Freq: Every day | ORAL | Status: DC

## 2012-06-06 MED ORDER — LISINOPRIL-HYDROCHLOROTHIAZIDE 20-25 MG PO TABS: 1.0000 | ORAL_TABLET | Freq: Every day | ORAL | Status: DC

## 2012-06-06 MED ORDER — GABAPENTIN 800 MG PO TABS: 600.0000 mg | ORAL_TABLET | Freq: Four times a day (QID) | ORAL | Status: DC

## 2012-06-06 MED ORDER — LEVOFLOXACIN 500 MG PO TABS: 500.0000 mg | ORAL_TABLET | Freq: Every day | ORAL | Status: DC

## 2012-06-06 MED ORDER — DULOXETINE HCL 60 MG PO CPEP: 60.0000 mg | ORAL_CAPSULE | Freq: Two times a day (BID) | ORAL | Status: DC

## 2012-06-06 NOTE — Progress Notes (Signed)
Patient ID: Brittney Sawyer MRN: [id number], DOB: 04-Feb-1966, 47 y.o. Date of Encounter: 06/06/2012, 11:31 AM  Primary Physician: No primary provider on file.  Chief Complaint:  Chief Complaint  Patient presents with  . Sinusitis    10 days  . Headache  . Cough  . Nausea    HPI: 47 y.o. year old female presents with 10 day history of nasal congestion, post nasal drip, sore throat, sinus pressure, and cough. Afebrile. No chills. Nasal congestion thick and green/yellow. Sinus pressure is the worst symptom. Cough is productive secondary to post nasal drip and not associated with time of day. Ears feel full, leading to sensation of muffled hearing. Has tried OTC cold preps without success.  Working with babies, infants and toddlers since 8/14  No recent antibiotics, recent travels, or sick contacts   No leg trauma, sedentary periods, h/o cancer. Has started the electronic cigarettes Headaches and facial pain for 10 days.  Past Medical History  Diagnosis Date  . Depression   . Anxiety   . Hyperlipidemia   . Hypertension   . Diabetes mellitus   . Substance abuse     Alcohol - sober since 2006     Home Meds: Prior to Admission medications   Medication Sig Start Date End Date Taking? Authorizing Provider  DULoxetine (CYMBALTA) 60 MG capsule Take 1 capsule (60 mg total) by mouth 2 (two) times daily. 05/29/12  Yes Morrell Riddle, PA-C  gabapentin (NEURONTIN) 800 MG tablet Take 600 mg by mouth 4 (four) times daily. 10/21/11  Yes Jonita Albee, MD  ibuprofen (ADVIL,MOTRIN) 600 MG tablet Take 600 mg by mouth every 6 (six) hours as needed. Pain   Yes Historical Provider, MD  lisinopril-hydrochlorothiazide (PRINZIDE,ZESTORETIC) 20-25 MG per tablet Take 1 tablet by mouth daily. 11/25/11  Yes Maurice March, MD  omeprazole (PRILOSEC) 20 MG capsule Take 20 mg by mouth daily. 11/25/11  Yes Maurice March, MD  traZODone (DESYREL) 100 MG tablet Take 3 tablets (300 mg total) by mouth at  bedtime. NEED OFFICE VISIT 05/12/12  Yes Godfrey Pick, PA-C  gabapentin (NEURONTIN) 800 MG tablet TAKE 1 TABLET 4 TIMES A DAY 05/05/12   Heather Jaquita Rector, PA-C  HYDROcodone-acetaminophen (LORCET) 10-650 MG per tablet Take 1 tablet by mouth every 6 (six) hours as needed. Pain    Historical Provider, MD    Allergies:  Allergies  Allergen Reactions  . Celebrex (Celecoxib) Swelling  . Wellbutrin (Bupropion) Rash    History   Social History  . Marital Status: Married    Spouse Name: N/A    Number of Children: N/A  . Years of Education: N/A   Occupational History  . Not on file.   Social History Main Topics  . Smoking status: Current Every Day Smoker -- 0.5 packs/day    Types: Cigarettes  . Smokeless tobacco: Never Used  . Alcohol Use: Yes     Comment: consumed ETOH previous couple days.Pt is a recovering alcoholic and has been sober since ~2006.  . Drug Use: Yes    Special: Marijuana, Oxycodone, Hydrocodone  . Sexually Active: Yes    Birth Control/ Protection: Surgical   Other Topics Concern  . Not on file   Social History Narrative  . No narrative on file     Review of Systems: Constitutional: negative for chills, fever, night sweats or weight changes Cardiovascular: negative for chest pain or palpitations Respiratory: negative for hemoptysis, wheezing, or shortness of breath Abdominal:  negative for abdominal pain, vomiting or diarrhea  positive for nausea Dermatological: negative for rash Neurologic: negative for headache   Physical Exam: Blood pressure 101/71, pulse 96, temperature 98.3 F (36.8 C), temperature source Oral, resp. rate 18, height 5' 1.5" (1.562 m), weight 195 lb 9.6 oz (88.724 kg), SpO2 96.00%., Body mass index is 36.36 kg/(m^2). General: Well developed, well nourished, in no acute distress. Head: Normocephalic, atraumatic, eyes without discharge, sclera non-icteric, nares are congested. Bilateral auditory canals clear, TM's are without perforation,  pearly grey with reflective cone of light bilaterally. Serous effusion bilaterally behind TM's. Maxillary sinus TTP. Oral cavity moist, dentition normal. Posterior pharynx with post nasal drip and mild erythema. No peritonsillar abscess or tonsillar exudate. Neck: Supple. No thyromegaly. Full ROM. No lymphadenopathy. Lungs: Clear bilaterally to auscultation without wheezes, rales, or rhonchi. Breathing is unlabored.  Heart: RRR with S1 S2. No murmurs, rubs, or gallops appreciated. Msk:  Strength and tone normal for age. Extremities: No clubbing or cyanosis. No edema. Neuro: Alert and oriented X 3. Moves all extremities spontaneously. CNII-XII grossly in tact. Psych:  Responds to questions appropriately with a normal affect.   Labs:   ASSESSMENT AND PLAN:  47 y.o. year old female with sinusitis -  -Tylenol/Motrin prn -Rest/fluids -RTC precautions -RTC 3-5 days if no improvement  Signed, Elvina Sidle, MD 06/06/2012 11:31 AM

## 2012-06-06 NOTE — Patient Instructions (Signed)

## 2012-06-08 ENCOUNTER — Telehealth: Payer: Self-pay

## 2012-06-08 NOTE — Telephone Encounter (Signed)
Tylenol/Motrin prn  -Rest/fluids  -RTC precautions  -RTC 3-5 days if no improvement Above per Dr Milus Glazier recommendation. I have called patient to advise she should come back in. She will come in today or tomorrow.

## 2012-06-08 NOTE — Telephone Encounter (Signed)
PT STATES SHE HAVE A REALLY BAD SINUS INFECTION AND WOULD LIKE TO KNOW IF WE WOULD CALL IN A STEROID FOR HER PLEASE CALL [phone]   [facility]

## 2012-06-09 ENCOUNTER — Encounter: Payer: Self-pay | Admitting: Family Medicine

## 2012-06-09 ENCOUNTER — Ambulatory Visit (INDEPENDENT_AMBULATORY_CARE_PROVIDER_SITE_OTHER): Payer: BC Managed Care – PPO | Admitting: Family Medicine

## 2012-06-09 VITALS — BP 113/79 | HR 105 | Temp 98.0°F | Resp 16 | Ht 61.5 in | Wt 195.0 lb

## 2012-06-09 DIAGNOSIS — H9209 Otalgia, unspecified ear: Secondary | ICD-10-CM

## 2012-06-09 DIAGNOSIS — R05 Cough: Secondary | ICD-10-CM

## 2012-06-09 DIAGNOSIS — E119 Type 2 diabetes mellitus without complications: Secondary | ICD-10-CM

## 2012-06-09 DIAGNOSIS — R059 Cough, unspecified: Secondary | ICD-10-CM

## 2012-06-09 DIAGNOSIS — J329 Chronic sinusitis, unspecified: Secondary | ICD-10-CM

## 2012-06-09 MED ORDER — PREDNISONE 20 MG PO TABS: 20.0000 mg | ORAL_TABLET | Freq: Every day | ORAL | Status: DC

## 2012-06-09 NOTE — Progress Notes (Signed)
Subjective:    Patient ID: Brittney Sawyer, female    DOB: 1965-11-02, 47 y.o.   MRN: [id number]  HPI Brittney Sawyer is a 47 y.o. female Seen 3 days ago - per note from Dr. Milus Glazier - 10 day history of nasal congestion, post nasal drip, sore throat, sinus pressure, and cough. Afebrile. No chills. Nasal congestion thick and green/yellow. Sinus pressure is the worst symptom. Cough is productive secondary to post nasal drip and not associated with time of day. Ears feel full, leading to sensation of muffled hearing. Has tried OTC cold preps without success.  Dx with sinusitis - treated with levaquin 500mg  QD for 10 days.   Not getting better yet. Now some soreness into ear - and under ear.  No fever. Still with sinus pressure and some cough since yesterday - green stuff.  Tx: motrin, tylenol, rx nasal spray, sudafed, over the counter generic Afrin - using for past 10 days using about once per day. No known hx of allergic rhinitis. Has not used saline nasal spray.    Current Outpatient Prescriptions on File Prior to Visit  Medication Sig Dispense Refill  . DULoxetine (CYMBALTA) 60 MG capsule Take 1 capsule (60 mg total) by mouth 2 (two) times daily.  180 capsule  3  . gabapentin (NEURONTIN) 800 MG tablet TAKE 1 TABLET 4 TIMES A DAY  120 tablet  2  . gabapentin (NEURONTIN) 800 MG tablet Take 1 tablet (800 mg total) by mouth 4 (four) times daily.  360 tablet  3  . ibuprofen (ADVIL,MOTRIN) 600 MG tablet Take 600 mg by mouth every 6 (six) hours as needed. Pain      . levofloxacin (LEVAQUIN) 500 MG tablet Take 1 tablet (500 mg total) by mouth daily.  10 tablet  0  . lisinopril-hydrochlorothiazide (PRINZIDE,ZESTORETIC) 20-25 MG per tablet Take 1 tablet by mouth daily.  90 tablet  3  . traZODone (DESYREL) 100 MG tablet Take 3 tablets (300 mg total) by mouth at bedtime. NEED OFFICE VISIT  270 tablet  3     Allergies  Allergen Reactions  . Celebrex (Celecoxib) Swelling  . Wellbutrin (Bupropion) Rash     Patient Active Problem List  Diagnosis  . HTN (hypertension)  . Mood disorder  . Chronic pain  . Menopause  . Tobacco user  . DDD (degenerative disc disease), lumbar  . History of substance abuse   History   Social History  . Marital Status: Married    Spouse Name: N/A    Number of Children: N/A  . Years of Education: N/A   Occupational History  . Not on file.   Social History Main Topics  . Smoking status: Current Every Day Smoker -- 0.5 packs/day    Types: Cigarettes  . Smokeless tobacco: Never Used  . Alcohol Use: Yes     Comment: consumed ETOH previous couple days.Pt is a recovering alcoholic and has been sober since ~2006.  . Drug Use: Yes    Special: Marijuana, Oxycodone, Hydrocodone  . Sexually Active: Yes    Birth Control/ Protection: Surgical   Other Topics Concern  . Not on file   Social History Narrative  . No narrative on file     Review of Systems As above. No fever.  Still with face pain, L ear pain.    Objective:   Physical Exam  Vitals reviewed. Constitutional: She is oriented to person, place, and time. She appears well-developed and well-nourished. No distress.  HENT:  Head:  Normocephalic and atraumatic.  Right Ear: Hearing, tympanic membrane, external ear and ear canal normal.  Left Ear: Hearing, tympanic membrane, external ear and ear canal normal.  Nose: Left sinus exhibits maxillary sinus tenderness.  Mouth/Throat: Oropharynx is clear and moist. No oropharyngeal exudate.  Eyes: Conjunctivae normal and EOM are normal. Pupils are equal, round, and reactive to light.  Cardiovascular: Normal rate, regular rhythm, normal heart sounds and intact distal pulses.   No murmur heard. Pulmonary/Chest: Effort normal and breath sounds normal. No respiratory distress. She has no wheezes. She has no rhonchi.  Neurological: She is alert and oriented to person, place, and time.  Skin: Skin is warm and dry. No rash noted.  Psychiatric: She has a  normal mood and affect. Her behavior is normal.   Results for orders placed in visit on 06/09/12  GLUCOSE, POCT (MANUAL RESULT ENTRY)      Component Value Range   POC Glucose 79  70 - 99 mg/dl       Assessment & Plan:  Brittney Sawyer is a 48 y.o. female 1. Diabetes  POCT glucose (manual entry)  2. Sinusitis    3. Cough    4. Otalgia     Sinusitis - persistent with possible rhinitis medicamentosa component with overuse of afrin.  Cough with PND, and referred pain or ETD causing L ear pain. Discussed may take longer for levaquin to work, stop afrin, start dsaline and mucinex DM if needed.  Prednisone rx, and rtc precautions.  Patient Instructions  Continue the antibiotic, stop afrin, start saline nasal spray - atleast 4-5 times per day.  mucinex dm for cough if needed. Prednisone for next 5 days.  If any new side effects - recheck.  Return to the clinic or go to the nearest emergency room if any of your symptoms worsen or new symptoms occur, including but not limited to fever or shortness of breath.  Sinusitis Sinusitis is redness, soreness, and swelling (inflammation) of the paranasal sinuses. Paranasal sinuses are air pockets within the bones of your face (beneath the eyes, the middle of the forehead, or above the eyes). In healthy paranasal sinuses, mucus is able to drain out, and air is able to circulate through them by way of your nose. However, when your paranasal sinuses are inflamed, mucus and air can become trapped. This can allow bacteria and other germs to grow and cause infection. Sinusitis can develop quickly and last only a short time (acute) or continue over a long period (chronic). Sinusitis that lasts for more than 12 weeks is considered chronic.  CAUSES  Causes of sinusitis include:  Allergies.  Structural abnormalities, such as displacement of the cartilage that separates your nostrils (deviated septum), which can decrease the air flow through your nose and sinuses and  affect sinus drainage.  Functional abnormalities, such as when the small hairs (cilia) that line your sinuses and help remove mucus do not work properly or are not present. SYMPTOMS  Symptoms of acute and chronic sinusitis are the same. The primary symptoms are pain and pressure around the affected sinuses. Other symptoms include:  Upper toothache.  Earache.  Headache.  Bad breath.  Decreased sense of smell and taste.  A cough, which worsens when you are lying flat.  Fatigue.  Fever.  Thick drainage from your nose, which often is green and may contain pus (purulent).  Swelling and warmth over the affected sinuses. DIAGNOSIS  Your caregiver will perform a physical exam. During the exam, your caregiver  may:  Look in your nose for signs of abnormal growths in your nostrils (nasal polyps).  Tap over the affected sinus to check for signs of infection.  View the inside of your sinuses (endoscopy) with a special imaging device with a light attached (endoscope), which is inserted into your sinuses. If your caregiver suspects that you have chronic sinusitis, one or more of the following tests may be recommended:  Allergy tests.  Nasal culture A sample of mucus is taken from your nose and sent to a lab and screened for bacteria.  Nasal cytology A sample of mucus is taken from your nose and examined by your caregiver to determine if your sinusitis is related to an allergy. TREATMENT  Most cases of acute sinusitis are related to a viral infection and will resolve on their own within 10 days. Sometimes medicines are prescribed to help relieve symptoms (pain medicine, decongestants, nasal steroid sprays, or saline sprays).  However, for sinusitis related to a bacterial infection, your caregiver will prescribe antibiotic medicines. These are medicines that will help kill the bacteria causing the infection.  Rarely, sinusitis is caused by a fungal infection. In theses cases, your caregiver  will prescribe antifungal medicine. For some cases of chronic sinusitis, surgery is needed. Generally, these are cases in which sinusitis recurs more than 3 times per year, despite other treatments. HOME CARE INSTRUCTIONS   Drink plenty of water. Water helps thin the mucus so your sinuses can drain more easily.  Use a humidifier.  Inhale steam 3 to 4 times a day (for example, sit in the bathroom with the shower running).  Apply a warm, moist washcloth to your face 3 to 4 times a day, or as directed by your caregiver.  Use saline nasal sprays to help moisten and clean your sinuses.  Take over-the-counter or prescription medicines for pain, discomfort, or fever only as directed by your caregiver. SEEK IMMEDIATE MEDICAL CARE IF:  You have increasing pain or severe headaches.  You have nausea, vomiting, or drowsiness.  You have swelling around your face.  You have vision problems.  You have a stiff neck.  You have difficulty breathing. MAKE SURE YOU:   Understand these instructions.  Will watch your condition.  Will get help right away if you are not doing well or get worse. Document Released: 04/26/2005 Document Revised: 07/19/2011 Document Reviewed: 05/11/2011 [facility] Patient Information 2013 [place], [place].

## 2012-06-09 NOTE — Patient Instructions (Signed)
Continue the antibiotic, stop afrin, start saline nasal spray - atleast 4-5 times per day.  mucinex dm for cough if needed. Prednisone for next 5 days.  If any new side effects - recheck.  Return to the clinic or go to the nearest emergency room if any of your symptoms worsen or new symptoms occur, including but not limited to fever or shortness of breath.  Sinusitis Sinusitis is redness, soreness, and swelling (inflammation) of the paranasal sinuses. Paranasal sinuses are air pockets within the bones of your face (beneath the eyes, the middle of the forehead, or above the eyes). In healthy paranasal sinuses, mucus is able to drain out, and air is able to circulate through them by way of your nose. However, when your paranasal sinuses are inflamed, mucus and air can become trapped. This can allow bacteria and other germs to grow and cause infection. Sinusitis can develop quickly and last only a short time (acute) or continue over a long period (chronic). Sinusitis that lasts for more than 12 weeks is considered chronic.  CAUSES  Causes of sinusitis include:  Allergies.  Structural abnormalities, such as displacement of the cartilage that separates your nostrils (deviated septum), which can decrease the air flow through your nose and sinuses and affect sinus drainage.  Functional abnormalities, such as when the small hairs (cilia) that line your sinuses and help remove mucus do not work properly or are not present. SYMPTOMS  Symptoms of acute and chronic sinusitis are the same. The primary symptoms are pain and pressure around the affected sinuses. Other symptoms include:  Upper toothache.  Earache.  Headache.  Bad breath.  Decreased sense of smell and taste.  A cough, which worsens when you are lying flat.  Fatigue.  Fever.  Thick drainage from your nose, which often is green and may contain pus (purulent).  Swelling and warmth over the affected sinuses. DIAGNOSIS  Your caregiver  will perform a physical exam. During the exam, your caregiver may:  Look in your nose for signs of abnormal growths in your nostrils (nasal polyps).  Tap over the affected sinus to check for signs of infection.  View the inside of your sinuses (endoscopy) with a special imaging device with a light attached (endoscope), which is inserted into your sinuses. If your caregiver suspects that you have chronic sinusitis, one or more of the following tests may be recommended:  Allergy tests.  Nasal culture A sample of mucus is taken from your nose and sent to a lab and screened for bacteria.  Nasal cytology A sample of mucus is taken from your nose and examined by your caregiver to determine if your sinusitis is related to an allergy. TREATMENT  Most cases of acute sinusitis are related to a viral infection and will resolve on their own within 10 days. Sometimes medicines are prescribed to help relieve symptoms (pain medicine, decongestants, nasal steroid sprays, or saline sprays).  However, for sinusitis related to a bacterial infection, your caregiver will prescribe antibiotic medicines. These are medicines that will help kill the bacteria causing the infection.  Rarely, sinusitis is caused by a fungal infection. In theses cases, your caregiver will prescribe antifungal medicine. For some cases of chronic sinusitis, surgery is needed. Generally, these are cases in which sinusitis recurs more than 3 times per year, despite other treatments. HOME CARE INSTRUCTIONS   Drink plenty of water. Water helps thin the mucus so your sinuses can drain more easily.  Use a humidifier.  Inhale steam 3  to 4 times a day (for example, sit in the bathroom with the shower running).  Apply a warm, moist washcloth to your face 3 to 4 times a day, or as directed by your caregiver.  Use saline nasal sprays to help moisten and clean your sinuses.  Take over-the-counter or prescription medicines for pain, discomfort,  or fever only as directed by your caregiver. SEEK IMMEDIATE MEDICAL CARE IF:  You have increasing pain or severe headaches.  You have nausea, vomiting, or drowsiness.  You have swelling around your face.  You have vision problems.  You have a stiff neck.  You have difficulty breathing. MAKE SURE YOU:   Understand these instructions.  Will watch your condition.  Will get help right away if you are not doing well or get worse. Document Released: 04/26/2005 Document Revised: 07/19/2011 Document Reviewed: 05/11/2011 [facility] Patient Information 2013 [place], [place].

## 2012-07-23 ENCOUNTER — Other Ambulatory Visit: Payer: BC Managed Care – PPO

## 2012-07-26 ENCOUNTER — Telehealth: Payer: Self-pay | Admitting: Radiology

## 2012-07-26 DIAGNOSIS — M25569 Pain in unspecified knee: Secondary | ICD-10-CM

## 2012-07-26 NOTE — Telephone Encounter (Signed)
Please advise patient her referral has been denied. Go ahead and make referral  to orthopedist of her choice for evaluation

## 2012-07-26 NOTE — Telephone Encounter (Signed)
Patients MRI knee has been denied by [employer], have advised patient we will proceed with ortho referral to you FYI

## 2012-07-27 ENCOUNTER — Other Ambulatory Visit: Payer: BC Managed Care – PPO

## 2012-07-27 NOTE — Telephone Encounter (Signed)
Done, ortho referral made

## 2012-12-12 ENCOUNTER — Telehealth: Payer: Self-pay | Admitting: *Deleted

## 2012-12-12 NOTE — Telephone Encounter (Signed)
PT CLEARENCE FORM IS AT TL DESK.  PT WILL BE IN TO SEE DR. L HOPEFULLY BY Sunday OF THIS WEEK.

## 2012-12-19 ENCOUNTER — Ambulatory Visit (INDEPENDENT_AMBULATORY_CARE_PROVIDER_SITE_OTHER): Payer: BC Managed Care – PPO | Admitting: Family Medicine

## 2012-12-19 VITALS — BP 110/78 | HR 95 | Temp 97.9°F | Resp 16 | Ht 60.5 in | Wt 192.0 lb

## 2012-12-19 DIAGNOSIS — Z720 Tobacco use: Secondary | ICD-10-CM

## 2012-12-19 DIAGNOSIS — Z0000 Encounter for general adult medical examination without abnormal findings: Secondary | ICD-10-CM

## 2012-12-19 DIAGNOSIS — I10 Essential (primary) hypertension: Secondary | ICD-10-CM

## 2012-12-19 DIAGNOSIS — E669 Obesity, unspecified: Secondary | ICD-10-CM

## 2012-12-19 DIAGNOSIS — E8881 Metabolic syndrome: Secondary | ICD-10-CM

## 2012-12-19 DIAGNOSIS — Z23 Encounter for immunization: Secondary | ICD-10-CM

## 2012-12-19 LAB — POCT CBC
Granulocyte percent: 62.8 %G (ref 37–80)
HCT, POC: 43.4 % (ref 37.7–47.9)
Lymph, poc: 3.4 (ref 0.6–3.4)
MCHC: 32 g/dL (ref 31.8–35.4)
MPV: 10.7 fL (ref 0–99.8)
POC Granulocyte: 7.2 — AB (ref 2–6.9)
POC LYMPH PERCENT: 29.6 %L (ref 10–50)
POC MID %: 7.6 %M (ref 0–12)
Platelet Count, POC: 322 10*3/uL (ref 142–424)
RDW, POC: 12 %

## 2012-12-19 LAB — LIPID PANEL
Cholesterol: 246 mg/dL — ABNORMAL HIGH (ref 0–200)
Total CHOL/HDL Ratio: 6.3 Ratio
Triglycerides: 762 mg/dL — ABNORMAL HIGH (ref <150)

## 2012-12-19 LAB — COMPREHENSIVE METABOLIC PANEL
ALT: 17 U/L (ref 0–35)
AST: 20 U/L (ref 0–37)
Alkaline Phosphatase: 59 U/L (ref 39–117)
CO2: 27 mEq/L (ref 19–32)
Creat: 0.84 mg/dL (ref 0.50–1.10)
Sodium: 134 mEq/L — ABNORMAL LOW (ref 135–145)
Total Bilirubin: 0.4 mg/dL (ref 0.3–1.2)
Total Protein: 7.3 g/dL (ref 6.0–8.3)

## 2012-12-19 LAB — POCT GLYCOSYLATED HEMOGLOBIN (HGB A1C): Hemoglobin A1C: 5.6

## 2012-12-19 NOTE — Progress Notes (Signed)
Subjective:    Patient ID: Brittney Sawyer, female    DOB: 08-Aug-1965, 47 y.o.   MRN: [id number] Chief Complaint  Patient presents with  . Medical Clearance    for right knee replacement   . Annual Exam    no pap    HPI  Brittney Sawyer is a delightful 47 yo here for pre-op clearance for a total knee surgery next mo. BP controlled for a while and has had elevated a1c in past but controlled with diet and exercise.  Had a banana today but nothing else.   Smoking about 1/2 ppd.  Really wants to quit so trying to use lozenges.  Had a reaction to wellbutrin years ago.  Has heard that chantix is bad for people in recovery (from drugs and alcohol) to take.  Past Medical History  Diagnosis Date  . Depression   . Anxiety   . Hyperlipidemia   . Hypertension   . Diabetes mellitus   . Substance abuse     Alcohol - sober since 2006   Current Outpatient Prescriptions on File Prior to Visit  Medication Sig Dispense Refill  . DULoxetine (CYMBALTA) 60 MG capsule Take 1 capsule (60 mg total) by mouth 2 (two) times daily.  180 capsule  3  . gabapentin (NEURONTIN) 800 MG tablet TAKE 1 TABLET 4 TIMES A DAY  120 tablet  2  . gabapentin (NEURONTIN) 800 MG tablet Take 1 tablet (800 mg total) by mouth 4 (four) times daily.  360 tablet  3  . ibuprofen (ADVIL,MOTRIN) 600 MG tablet Take 600 mg by mouth every 6 (six) hours as needed. Pain      . lisinopril-hydrochlorothiazide (PRINZIDE,ZESTORETIC) 20-25 MG per tablet Take 1 tablet by mouth daily.  90 tablet  3   No current facility-administered medications on file prior to visit.   Allergies  Allergen Reactions  . Celebrex [Celecoxib] Swelling  . Wellbutrin [Bupropion] Rash   Past Surgical History  Procedure Laterality Date  . Abdominal hysterectomy    . Spine surgery     History   Social History  . Marital Status: Married    Spouse Name: N/A    Number of Children: N/A  . Years of Education: N/A   Social History Main Topics  . Smoking status: Current  Every Day Smoker -- 0.50 packs/day    Types: Cigarettes  . Smokeless tobacco: Never Used  . Alcohol Use: Yes     Comment: consumed ETOH previous couple days.Pt is a recovering alcoholic and has been sober since ~2006.  . Drug Use: Yes    Special: Marijuana, Oxycodone, Hydrocodone  . Sexual Activity: Yes    Birth Control/ Protection: Surgical   Other Topics Concern  . None   Social History Narrative  . None   Family History  Problem Relation Age of Onset  . Thyroid disease Mother   . Hypertension Father      Review of Systems  Musculoskeletal: Positive for back pain, joint swelling, arthralgias and gait problem.  Psychiatric/Behavioral: The patient is nervous/anxious.   All other systems reviewed and are negative.       BP 110/78  Pulse 95  Temp(Src) 97.9 F (36.6 C)  Resp 16  Ht 5' 0.5" (1.537 m)  Wt 192 lb (87.091 kg)  BMI 36.87 kg/m2  SpO2 96% Objective:   Physical Exam  Constitutional: She is oriented to person, place, and time. She appears well-developed and well-nourished. No distress.  HENT:  Head: Normocephalic and atraumatic.  Right  Ear: External ear normal.  Left Ear: External ear normal.  Eyes: Conjunctivae are normal. No scleral icterus.  Neck: Normal range of motion. Neck supple. No thyromegaly present.  Cardiovascular: Normal rate, regular rhythm, normal heart sounds and intact distal pulses.   Pulmonary/Chest: Effort normal and breath sounds normal. No respiratory distress.  Musculoskeletal: She exhibits no edema.  Lymphadenopathy:    She has no cervical adenopathy.  Neurological: She is alert and oriented to person, place, and time.  Skin: Skin is warm and dry. She is not diaphoretic. No erythema.  Psychiatric: She has a normal mood and affect. Her behavior is normal.      Results for orders placed in visit on 12/19/12  COMPREHENSIVE METABOLIC PANEL      Result Value Range   Sodium 134 (*) 135 - 145 mEq/L   Potassium 4.1  3.5 - 5.3 mEq/L    Chloride 97  96 - 112 mEq/L   CO2 27  19 - 32 mEq/L   Glucose, Bld 85  70 - 99 mg/dL   BUN 15  6 - 23 mg/dL   Creat 1.61  0.96 - 0.45 mg/dL   Total Bilirubin 0.4  0.3 - 1.2 mg/dL   Alkaline Phosphatase 59  39 - 117 U/L   AST 20  0 - 37 U/L   ALT 17  0 - 35 U/L   Total Protein 7.3  6.0 - 8.3 g/dL   Albumin 4.7  3.5 - 5.2 g/dL   Calcium 40.9  8.4 - 81.1 mg/dL  LIPID PANEL      Result Value Range   Cholesterol 246 (*) 0 - 200 mg/dL   Triglycerides 914 (*) <150 mg/dL   HDL 39 (*) >78 mg/dL   Total CHOL/HDL Ratio 6.3     VLDL NOT CALC  0 - 40 mg/dL   LDL Cholesterol      LDL CHOLESTEROL, DIRECT      Result Value Range   Direct LDL 99    POCT CBC      Result Value Range   WBC 11.4 (*) 4.6 - 10.2 K/uL   Lymph, poc 3.4  0.6 - 3.4   POC LYMPH PERCENT 29.6  10 - 50 %L   MID (cbc) 0.9  0 - 0.9   POC MID % 7.6  0 - 12 %M   POC Granulocyte 7.2 (*) 2 - 6.9   Granulocyte percent 62.8  37 - 80 %G   RBC 4.28  4.04 - 5.48 M/uL   Hemoglobin 13.9  12.2 - 16.2 g/dL   HCT, POC 29.5  62.1 - 47.9 %   MCV 101.4 (*) 80 - 97 fL   MCH, POC 32.5 (*) 27 - 31.2 pg   MCHC 32.0  31.8 - 35.4 g/dL   RDW, POC 30.8     Platelet Count, POC 322  142 - 424 K/uL   MPV 10.7  0 - 99.8 fL  POCT GLYCOSYLATED HEMOGLOBIN (HGB A1C)      Result Value Range   Hemoglobin A1C 5.6     [facility] reading (PRIMARY) by  Dr. Clelia Croft. EKG: NSR, no ischemic changes Spirometry: Normal    Assessment & Plan:  HTN (hypertension) - Plan: EKG 12-Lead, POCT CBC, POCT glycosylated hemoglobin (Hb A1C), Comprehensive metabolic panel, Lipid panel, LDL Cholesterol, Direct. Well controlled on current regimen.,  Tobacco user - Plan: EKG 12-Lead, POCT CBC, POCT glycosylated hemoglobin (Hb A1C), Comprehensive metabolic panel, Lipid panel, LDL Cholesterol, Direct - consider trial  of chantix. Normal spirometry  Obesity (BMI 35.0-39.9 without comorbidity)  Routine general medical examination at a health care facility - Pneumonvax given today -  none prior and pt w/ tob use and h/o glucose intolerance.  Cleared for surgery w/o restrictions.  Form faxed to [facility] and scanned.  TDaP UTD, pap UTD.  Metabolic syndrome - by abd circumference, HTN, high trig, low hdl - recheck lipids and work on tlc.  No orders of the defined types were placed in this encounter.

## 2012-12-19 NOTE — Patient Instructions (Signed)

## 2012-12-20 ENCOUNTER — Encounter: Payer: Self-pay | Admitting: Family Medicine

## 2012-12-20 DIAGNOSIS — E8881 Metabolic syndrome: Secondary | ICD-10-CM | POA: Insufficient documentation

## 2012-12-26 ENCOUNTER — Other Ambulatory Visit: Payer: Self-pay | Admitting: Physician Assistant

## 2012-12-26 NOTE — H&P (Signed)
TOTAL KNEE ADMISSION H&P  Patient is being admitted for right total knee arthroplasty.  Subjective:  Chief Complaint:right knee pain.  HPI: Brittney Sawyer, 47 y.o. female, has a history of pain and functional disability in the right knee due to arthritis and has failed non-surgical conservative treatments for greater than 12 weeks to includeNSAID's and/or analgesics, corticosteriod injections, viscosupplementation injections and activity modification.  Onset of symptoms was gradual, starting 5 years ago with gradually worsening course since that time. The patient noted no past surgery on the right knee(s).  Patient currently rates pain in the right knee(s) at 10 out of 10 with activity. Patient has night pain, worsening of pain with activity and weight bearing, pain that interferes with activities of daily living, pain with passive range of motion, crepitus and joint swelling.  Patient has evidence of periarticular osteophytes and joint space narrowing by imaging studies.  There is no active infection.  Patient Active Problem List   Diagnosis Date Noted  . Metabolic syndrome 12/20/2012  . Obesity (BMI 35.0-39.9 without comorbidity) 12/19/2012  . History of substance abuse 01/07/2012  . Menopause 11/25/2011  . Tobacco user 11/25/2011  . DDD (degenerative disc disease), lumbar 11/25/2011  . HTN (hypertension) 10/21/2011  . Mood disorder 10/21/2011  . Chronic pain 10/21/2011   Past Medical History  Diagnosis Date  . Depression   . Anxiety   . Hyperlipidemia   . Hypertension   . Diabetes mellitus   . Substance abuse     Alcohol - sober since 2006    Past Surgical History  Procedure Laterality Date  . Abdominal hysterectomy    . Spine surgery       (Not in a hospital admission) Allergies  Allergen Reactions  . Celebrex [Celecoxib] Swelling  . Wellbutrin [Bupropion] Rash    History  Substance Use Topics  . Smoking status: Current Every Day Smoker -- 0.50 packs/day    Types:  Cigarettes  . Smokeless tobacco: Never Used  . Alcohol Use: Yes     Comment: consumed ETOH previous couple days.Pt is a recovering alcoholic and has been sober since ~2006.    Family History  Problem Relation Age of Onset  . Thyroid disease Mother   . Hypertension Father      Review of Systems  Constitutional: Negative.   HENT: Negative.   Eyes: Negative.   Respiratory: Negative.   Cardiovascular: Negative.   Gastrointestinal: Positive for diarrhea and constipation. Negative for heartburn, nausea, vomiting, abdominal pain, blood in stool and melena.  Genitourinary: Negative.   Musculoskeletal: Positive for back pain and joint pain. Negative for falls.  Skin: Negative.   Neurological: Negative.   Endo/Heme/Allergies: Negative.   Psychiatric/Behavioral: Positive for depression. Negative for suicidal ideas. The patient is nervous/anxious.     Objective:  Physical Exam  Constitutional: She is oriented to person, place, and time. She appears well-developed and well-nourished. No distress.  HENT:  Head: Normocephalic and atraumatic.  Nose: Nose normal.  Eyes: Conjunctivae and EOM are normal. Pupils are equal, round, and reactive to light.  Neck: Normal range of motion. Neck supple.  Cardiovascular: Normal rate, regular rhythm, normal heart sounds and intact distal pulses.   No murmur heard. Respiratory: Effort normal and breath sounds normal. No respiratory distress. She has no wheezes. She has no rales. She exhibits no tenderness.  GI: Soft. Bowel sounds are normal. She exhibits no distension. There is no tenderness.  Musculoskeletal:  Intact DF/PF ankle, SILT all dermatomes, no calf tenderness to  palpation, joint line tenderness knee, varus knee, stable ligamentous testing, positive patellofemoral crepitus, no swelling or effusion   Lymphadenopathy:    She has no cervical adenopathy.  Neurological: She is alert and oriented to person, place, and time. No cranial nerve deficit.   Skin: Skin is warm and dry. No rash noted. No erythema.  Psychiatric: She has a normal mood and affect. Her behavior is normal.    Vital signs in last 24 hours: @VSRANGES @  Labs:   Estimated body mass index is 36.87 kg/(m^2) as calculated from the following:   Height as of 12/19/12: 5' 0.5" (1.537 m).   Weight as of 12/19/12: 87.091 kg (192 lb).   Imaging Review Plain radiographs demonstrate moderate degenerative joint disease of the right knee(s). The overall alignment ismild varus. The bone quality appears to be good for age and reported activity level.  Assessment/Plan:  End stage arthritis, right knee   The patient history, physical examination, clinical judgment of the provider and imaging studies are consistent with end stage degenerative joint disease of the right knee(s) and total knee arthroplasty is deemed medically necessary. The treatment options including medical management, injection therapy arthroscopy and arthroplasty were discussed at length. The risks and benefits of total knee arthroplasty were presented and reviewed. The risks due to aseptic loosening, infection, stiffness, patella tracking problems, thromboembolic complications and other imponderables were discussed. The patient acknowledged the explanation, agreed to proceed with the plan and consent was signed. Patient is being admitted for inpatient treatment for surgery, pain control, PT, OT, prophylactic antibiotics, VTE prophylaxis, progressive ambulation and ADL's and discharge planning. The patient is planning to be discharged home with home health services, antibiotic cement with surgery

## 2012-12-28 ENCOUNTER — Encounter (HOSPITAL_COMMUNITY): Payer: Self-pay | Admitting: Pharmacy Technician

## 2013-01-04 ENCOUNTER — Encounter (HOSPITAL_COMMUNITY)
Admission: RE | Admit: 2013-01-04 | Discharge: 2013-01-04 | Disposition: A | Discharge status: Home / Self Care | Payer: BC Managed Care – PPO | Source: Ambulatory Visit | Attending: Orthopedic Surgery | Admitting: Orthopedic Surgery

## 2013-01-04 ENCOUNTER — Encounter (HOSPITAL_COMMUNITY)
Admission: RE | Admit: 2013-01-04 | Discharge: 2013-01-04 | Disposition: A | Discharge status: Home / Self Care | Payer: BC Managed Care – PPO | Source: Ambulatory Visit | Attending: Physician Assistant | Admitting: Physician Assistant

## 2013-01-04 ENCOUNTER — Encounter (HOSPITAL_COMMUNITY): Payer: Self-pay

## 2013-01-04 DIAGNOSIS — Z01818 Encounter for other preprocedural examination: Secondary | ICD-10-CM | POA: Insufficient documentation

## 2013-01-04 DIAGNOSIS — Z01812 Encounter for preprocedural laboratory examination: Secondary | ICD-10-CM | POA: Insufficient documentation

## 2013-01-04 LAB — CBC WITH DIFFERENTIAL/PLATELET
Basophils Absolute: 0.1 10*3/uL (ref 0.0–0.1)
Basophils Relative: 0 % (ref 0–1)
HCT: 42.7 % (ref 36.0–46.0)
Hemoglobin: 14.6 g/dL (ref 12.0–15.0)
Lymphocytes Relative: 15 % (ref 12–46)
MCHC: 34.2 g/dL (ref 30.0–36.0)
Neutro Abs: 15.9 10*3/uL — ABNORMAL HIGH (ref 1.7–7.7)
Neutrophils Relative %: 79 % — ABNORMAL HIGH (ref 43–77)
RDW: 12 % (ref 11.5–15.5)
WBC: 20.1 10*3/uL — ABNORMAL HIGH (ref 4.0–10.5)

## 2013-01-04 LAB — URINALYSIS, ROUTINE W REFLEX MICROSCOPIC
Ketones, ur: NEGATIVE mg/dL
Leukocytes, UA: NEGATIVE
Nitrite: NEGATIVE
Specific Gravity, Urine: 1.02 (ref 1.005–1.030)
pH: 6.5 (ref 5.0–8.0)

## 2013-01-04 LAB — COMPREHENSIVE METABOLIC PANEL
ALT: 14 U/L (ref 0–35)
AST: 16 U/L (ref 0–37)
Albumin: 4.7 g/dL (ref 3.5–5.2)
Alkaline Phosphatase: 57 U/L (ref 39–117)
CO2: 25 mEq/L (ref 19–32)
Chloride: 99 mEq/L (ref 96–112)
GFR calc non Af Amer: >90 mL/min (ref >90)
Potassium: 4.9 mEq/L (ref 3.5–5.1)
Total Bilirubin: 0.2 mg/dL — ABNORMAL LOW (ref 0.3–1.2)

## 2013-01-04 LAB — TYPE AND SCREEN

## 2013-01-04 LAB — SURGICAL PCR SCREEN
MRSA, PCR: NEGATIVE
Staphylococcus aureus: POSITIVE — AB

## 2013-01-04 LAB — APTT: aPTT: 33 seconds (ref 24–37)

## 2013-01-04 NOTE — Pre-Procedure Instructions (Signed)
Brittney Sawyer  01/04/2013   Your procedure is scheduled on:  01/12/13  Report to [facility] at 530 AM.  Call this number if you have problems the morning of surgery: [phone]   Remember:   Do not eat food or drink liquids after midnight.   Take these medicines the morning of surgery with A SIP OF WATER: cymbalta,neurotin,hydrocodone,prilosec   Do not wear jewelry, make-up or nail polish.  Do not wear lotions, powders, or perfumes. You may wear deodorant.  Do not shave 48 hours prior to surgery. Men may shave face and neck.  Do not bring valuables to the hospital.  [facility] is not responsible                   for any belongings or valuables.  Contacts, dentures or bridgework may not be worn into surgery.  Leave suitcase in the car. After surgery it may be brought to your room.  For patients admitted to the hospital, checkout time is 11:00 AM the day of  discharge.   Patients discharged the day of surgery will not be allowed to drive  home.  Name and phone number of your driver: family  Special Instructions: Shower using CHG 2 nights before surgery and the night before surgery.  If you shower the day of surgery use CHG.  Use special wash - you have one bottle of CHG for all showers.  You should use approximately 1/3 of the bottle for each shower.   Please read over the following fact sheets that you were given: Pain Booklet, Coughing and Deep Breathing, Blood Transfusion Information, MRSA Information and Surgical Site Infection Prevention

## 2013-01-05 ENCOUNTER — Ambulatory Visit (INDEPENDENT_AMBULATORY_CARE_PROVIDER_SITE_OTHER): Payer: BC Managed Care – PPO | Admitting: Emergency Medicine

## 2013-01-05 ENCOUNTER — Telehealth: Payer: Self-pay

## 2013-01-05 ENCOUNTER — Other Ambulatory Visit: Payer: Self-pay | Admitting: Family Medicine

## 2013-01-05 VITALS — BP 112/72 | HR 97 | Temp 99.0°F | Resp 16 | Ht 62.0 in | Wt 185.0 lb

## 2013-01-05 DIAGNOSIS — I10 Essential (primary) hypertension: Secondary | ICD-10-CM

## 2013-01-05 DIAGNOSIS — Z720 Tobacco use: Secondary | ICD-10-CM

## 2013-01-05 DIAGNOSIS — E669 Obesity, unspecified: Secondary | ICD-10-CM

## 2013-01-05 DIAGNOSIS — J018 Other acute sinusitis: Secondary | ICD-10-CM

## 2013-01-05 MED ORDER — AMOXICILLIN-POT CLAVULANATE ER 1000-62.5 MG PO TB12: 2.0000 | ORAL_TABLET | Freq: Two times a day (BID) | ORAL | Status: DC

## 2013-01-05 MED ORDER — PSEUDOEPHEDRINE-GUAIFENESIN ER 60-600 MG PO TB12: 1.0000 | ORAL_TABLET | Freq: Two times a day (BID) | ORAL | Status: DC

## 2013-01-05 NOTE — Progress Notes (Signed)
[facility] [address] [place] [address] [phone]  Date:  01/05/2013   Name:  Brittney Sawyer   DOB:  12-17-1965   MRN:  [id number]  PCP:  Nilda Simmer, MD    Chief Complaint: elevated WBC   History of Present Illness:  Brittney Sawyer is a 47 y.o. very pleasant female patient who presents with the following:  Seen yesterday for preoperative testing and found to have elevated WBC and denied clearance.  Has seasonal allergic rhinitis.  Has mucoid nasal drainage and post nasal drainage.  Non productive cough.  Smokes 1/2 PPD.  Says "fever" of 99.5.  No wheezing or shortness of breath. No dysuria, urgency or frequency.   No rash, nausea or vomiting or diarrhea.  No improvement with over the counter medications or other home remedies. Denies other complaint or health concern today.   Patient Active Problem List   Diagnosis Date Noted  . Metabolic syndrome 12/20/2012  . Obesity (BMI 35.0-39.9 without comorbidity) 12/19/2012  . History of substance abuse 01/07/2012  . Menopause 11/25/2011  . Tobacco user 11/25/2011  . DDD (degenerative disc disease), lumbar 11/25/2011  . HTN (hypertension) 10/21/2011  . Mood disorder 10/21/2011  . Chronic pain 10/21/2011    Past Medical History  Diagnosis Date  . Depression   . Anxiety   . Hyperlipidemia   . Hypertension   . Substance abuse     Alcohol - sober since 2006    Past Surgical History  Procedure Laterality Date  . Abdominal hysterectomy    . Spine surgery      History  Substance Use Topics  . Smoking status: Current Every Day Smoker -- 0.50 packs/day for 10 years    Types: Cigarettes  . Smokeless tobacco: Never Used  . Alcohol Use: Yes     Comment: consumed ETOH previous couple days.Pt is a recovering alcoholic and has been sober since ~2006.    Family History  Problem Relation Age of Onset  . Thyroid disease Mother   . Hypertension Father   . Anxiety disorder Sister   . Cancer Paternal  Grandmother     prostate    Allergies  Allergen Reactions  . Celebrex [Celecoxib] Swelling  . Wellbutrin [Bupropion] Rash    Medication list has been reviewed and updated.  Current Outpatient Prescriptions on File Prior to Visit  Medication Sig Dispense Refill  . DULoxetine (CYMBALTA) 60 MG capsule Take 1 capsule (60 mg total) by mouth 2 (two) times daily.  180 capsule  3  . gabapentin (NEURONTIN) 800 MG tablet Take 1 tablet (800 mg total) by mouth 4 (four) times daily.  360 tablet  3  . HYDROcodone-acetaminophen (NORCO/VICODIN) 5-325 MG per tablet Take 1 tablet by mouth every 6 (six) hours as needed for pain.      Marland Kitchen lisinopril-hydrochlorothiazide (PRINZIDE,ZESTORETIC) 20-25 MG per tablet Take 1 tablet by mouth daily.  90 tablet  3  . mometasone (NASONEX) 50 MCG/ACT nasal spray Place 1 spray into the nose daily.      . Nutritional Supplements (ESTROVEN PO) Take 1 tablet by mouth daily.      Marland Kitchen omeprazole (PRILOSEC) 20 MG capsule Take 20 mg by mouth daily.      . trazodone (DESYREL) 300 MG tablet Take 300 mg by mouth at bedtime.      Marland Kitchen ibuprofen (ADVIL,MOTRIN) 800 MG tablet Take 800 mg by mouth every 8 (eight) hours as needed for pain.      Marland Kitchen  Omega-3 Fatty Acids (FISH OIL PO) Take 1 capsule by mouth 2 (two) times daily.       No current facility-administered medications on file prior to visit.    Review of Systems:  As per HPI, otherwise negative.    Physical Examination: Filed Vitals:   01/05/13 0758  BP: 112/72  Pulse: 97  Temp: 99 F (37.2 C)  Resp: 16   Filed Vitals:   01/05/13 0758  Height: 5\' 2"  (1.575 m)  Weight: 185 lb (83.915 kg)   Body mass index is 33.83 kg/(m^2). Ideal Body Weight: Weight in (lb) to have BMI = 25: 136.4  GEN: WDWN, NAD, Non-toxic, A & O x 3 HEENT: Atraumatic, Normocephalic. Neck supple. No masses, No LAD. Ears and Nose: No external deformity.  Purulent nasal drainage CV: RRR, No M/G/R. No JVD. No thrill. No extra heart sounds. PULM: CTA  B, no wheezes, crackles, rhonchi. No retractions. No resp. distress. No accessory muscle use. ABD: S, NT, ND, +BS. No rebound. No HSM. EXTR: No c/c/e NEURO Normal gait.  PSYCH: Normally interactive. Conversant. Not depressed or anxious appearing.  Calm demeanor.    Assessment and Plan: Sinusitis augmentin XR mucinex d Stop smoking  Signed,  Phillips Odor, MD

## 2013-01-05 NOTE — Patient Instructions (Addendum)
Smoking Cessation Quitting smoking is important to your health and has many advantages. However, it is not always easy to quit since nicotine is a very addictive drug. Often times, people try 3 times or more before being able to quit. This document explains the best ways for you to prepare to quit smoking. Quitting takes hard work and a lot of effort, but you can do it. ADVANTAGES OF QUITTING SMOKING  You will live longer, feel better, and live better.  Your body will feel the impact of quitting smoking almost immediately.  Within 20 minutes, blood pressure decreases. Your pulse returns to its normal level.  After 8 hours, carbon monoxide levels in the blood return to normal. Your oxygen level increases.  After 24 hours, the chance of having a heart attack starts to decrease. Your breath, hair, and body stop smelling like smoke.  After 48 hours, damaged nerve endings begin to recover. Your sense of taste and smell improve.  After 72 hours, the body is virtually free of nicotine. Your bronchial tubes relax and breathing becomes easier.  After 2 to 12 weeks, lungs can hold more air. Exercise becomes easier and circulation improves.  The risk of having a heart attack, stroke, cancer, or lung disease is greatly reduced.  After 1 year, the risk of coronary heart disease is cut in half.  After 5 years, the risk of stroke falls to the same as a nonsmoker.  After 10 years, the risk of lung cancer is cut in half and the risk of other cancers decreases significantly.  After 15 years, the risk of coronary heart disease drops, usually to the level of a nonsmoker.  If you are pregnant, quitting smoking will improve your chances of having a healthy baby.  The people you live with, especially any children, will be healthier.  You will have extra money to spend on things other than cigarettes. QUESTIONS TO THINK ABOUT BEFORE ATTEMPTING TO QUIT You may want to talk about your answers with your  caregiver.  Why do you want to quit?  If you tried to quit in the past, what helped and what did not?  What will be the most difficult situations for you after you quit? How will you plan to handle them?  Who can help you through the tough times? Your family? Friends? A caregiver?  What pleasures do you get from smoking? What ways can you still get pleasure if you quit? Here are some questions to ask your caregiver:  How can you help me to be successful at quitting?  What medicine do you think would be best for me and how should I take it?  What should I do if I need more help?  What is smoking withdrawal like? How can I get information on withdrawal? GET READY  Set a quit date.  Change your environment by getting rid of all cigarettes, ashtrays, matches, and lighters in your home, car, or work. Do not let people smoke in your home.  Review your past attempts to quit. Think about what worked and what did not. GET SUPPORT AND ENCOURAGEMENT You have a better chance of being successful if you have help. You can get support in many ways.  Tell your family, friends, and co-workers that you are going to quit and need their support. Ask them not to smoke around you.  Get individual, group, or telephone counseling and support. Programs are available at local hospitals and health centers. Call your local health department for   information about programs in your area.  Spiritual beliefs and practices may help some smokers quit.  Download a "quit meter" on your computer to keep track of quit statistics, such as how long you have gone without smoking, cigarettes not smoked, and money saved.  Get a self-help book about quitting smoking and staying off of tobacco. LEARN NEW SKILLS AND BEHAVIORS  Distract yourself from urges to smoke. Talk to someone, go for a walk, or occupy your time with a task.  Change your normal routine. Take a different route to work. Drink tea instead of coffee.  Eat breakfast in a different place.  Reduce your stress. Take a hot bath, exercise, or read a book.  Plan something enjoyable to do every day. Reward yourself for not smoking.  Explore interactive web-based programs that specialize in helping you quit. GET MEDICINE AND USE IT CORRECTLY Medicines can help you stop smoking and decrease the urge to smoke. Combining medicine with the above behavioral methods and support can greatly increase your chances of successfully quitting smoking.  Nicotine replacement therapy helps deliver nicotine to your body without the negative effects and risks of smoking. Nicotine replacement therapy includes nicotine gum, lozenges, inhalers, nasal sprays, and skin patches. Some may be available over-the-counter and others require a prescription.  Antidepressant medicine helps people abstain from smoking, but how this works is unknown. This medicine is available by prescription.  Nicotinic receptor partial agonist medicine simulates the effect of nicotine in your brain. This medicine is available by prescription. Ask your caregiver for advice about which medicines to use and how to use them based on your health history. Your caregiver will tell you what side effects to look out for if you choose to be on a medicine or therapy. Carefully read the information on the package. Do not use any other product containing nicotine while using a nicotine replacement product.  RELAPSE OR DIFFICULT SITUATIONS Most relapses occur within the first 3 months after quitting. Do not be discouraged if you start smoking again. Remember, most people try several times before finally quitting. You may have symptoms of withdrawal because your body is used to nicotine. You may crave cigarettes, be irritable, feel very hungry, cough often, get headaches, or have difficulty concentrating. The withdrawal symptoms are only temporary. They are strongest when you first quit, but they will go away within  10 14 days. To reduce the chances of relapse, try to:  Avoid drinking alcohol. Drinking lowers your chances of successfully quitting.  Reduce the amount of caffeine you consume. Once you quit smoking, the amount of caffeine in your body increases and can give you symptoms, such as a rapid heartbeat, sweating, and anxiety.  Avoid smokers because they can make you want to smoke.  Do not let weight gain distract you. Many smokers will gain weight when they quit, usually less than 10 pounds. Eat a healthy diet and stay active. You can always lose the weight gained after you quit.  Find ways to improve your mood other than smoking. FOR MORE INFORMATION  www.smokefree.gov  Document Released: 04/20/2001 Document Revised: 10/26/2011 Document Reviewed: 08/05/2011 [employer] Patient Information 2014 [employer].  

## 2013-01-05 NOTE — Telephone Encounter (Signed)
Please send todays ov note to kelly at [facility] - we referred her   Fax  [phone]

## 2013-01-05 NOTE — Telephone Encounter (Signed)
Done

## 2013-01-06 ENCOUNTER — Other Ambulatory Visit: Payer: Self-pay | Admitting: Family Medicine

## 2013-01-06 ENCOUNTER — Other Ambulatory Visit: Payer: Self-pay | Admitting: Emergency Medicine

## 2013-01-06 NOTE — Telephone Encounter (Signed)
Please call patient - we have not seen her for evaluation of GERD and have not prescribed this medication.

## 2013-01-08 NOTE — Telephone Encounter (Signed)
Dr Clelia Croft, you saw pt in Aug for a med exam/PE but don't see that you specifically discussed GERD. You asked pt to RTC in 6 mos for next visit. Do you want to Rx the omprazole of should pt RTC to discuss first?

## 2013-01-12 ENCOUNTER — Encounter (HOSPITAL_COMMUNITY): Admission: RE | Payer: Self-pay | Source: Ambulatory Visit

## 2013-01-12 ENCOUNTER — Inpatient Hospital Stay (HOSPITAL_COMMUNITY)
Admission: RE | Admit: 2013-01-12 | Payer: BC Managed Care – PPO | Source: Ambulatory Visit | Admitting: Orthopedic Surgery

## 2013-01-12 SURGERY — ARTHROPLASTY, KNEE, TOTAL
Anesthesia: General | Laterality: Right

## 2013-01-15 ENCOUNTER — Ambulatory Visit (INDEPENDENT_AMBULATORY_CARE_PROVIDER_SITE_OTHER): Payer: BC Managed Care – PPO | Admitting: Emergency Medicine

## 2013-01-15 VITALS — BP 120/72 | HR 89 | Temp 98.1°F | Resp 16 | Ht 61.0 in | Wt 186.0 lb

## 2013-01-15 DIAGNOSIS — M25561 Pain in right knee: Secondary | ICD-10-CM

## 2013-01-15 DIAGNOSIS — D72829 Elevated white blood cell count, unspecified: Secondary | ICD-10-CM

## 2013-01-15 DIAGNOSIS — M25569 Pain in unspecified knee: Secondary | ICD-10-CM

## 2013-01-15 LAB — POCT CBC
HCT, POC: 41.1 % (ref 37.7–47.9)
Hemoglobin: 13.4 g/dL (ref 12.2–16.2)
MCH, POC: 32.8 pg — AB (ref 27–31.2)
MPV: 11.2 fL (ref 0–99.8)
POC MID %: 6.2 %M (ref 0–12)
RBC: 4.08 M/uL (ref 4.04–5.48)
WBC: 12 10*3/uL — AB (ref 4.6–10.2)

## 2013-01-15 NOTE — Progress Notes (Signed)
  Subjective:    Patient ID: Brittney Sawyer, female    DOB: 10-09-1965, 47 y.o.   MRN: [id number]  HPI Pt here for surgical clearance. She has had a history of high white count. She was put on Augmentin for a possible sinus infection here by Dr Dareen Piano. But she needs her white count checked before she can have knee surgery.      Review of Systems patient's long history of substance abuse. This has involved pills and alcohol. She has had multiple relapses by history. She seems to have more problems in the summer when she is off from her regular job     Objective:   Physical Exam patient is alert cooperative not in distress. TMs are clear nose normal throat is clear chest clear to auscultation and percussion     Results for orders placed in visit on 01/15/13  POCT CBC      Result Value Range   WBC 12.0 (*) 4.6 - 10.2 K/uL   Lymph, poc 2.4  0.6 - 3.4   POC LYMPH PERCENT 20.4  10 - 50 %L   MID (cbc) 0.7  0 - 0.9   POC MID % 6.2  0 - 12 %M   POC Granulocyte 8.8 (*) 2 - 6.9   Granulocyte percent 73.4  37 - 80 %G   RBC 4.08  4.04 - 5.48 M/uL   Hemoglobin 13.4  12.2 - 16.2 g/dL   HCT, POC 62.1  30.8 - 47.9 %   MCV 100.8 (*) 80 - 97 fL   MCH, POC 32.8 (*) 27 - 31.2 pg   MCHC 32.6  31.8 - 35.4 g/dL   RDW, POC 65.7     Platelet Count, POC 284  142 - 424 K/uL   MPV 11.2  0 - 99.8 fL      Assessment & Plan:  Patient has intermittent elevated white count. We'll request past review and plan on proceeding with the upcoming surgery if her path review is normal

## 2013-01-16 LAB — PATHOLOGIST SMEAR REVIEW

## 2013-01-22 ENCOUNTER — Encounter (HOSPITAL_COMMUNITY): Payer: Self-pay | Admitting: Pharmacy Technician

## 2013-01-22 NOTE — Pre-Procedure Instructions (Addendum)
Brittney Sawyer  01/22/2013   Your procedure is scheduled on:  FRIDAY, SEPTEMBER 19TH.  Report to [facility], Main Entrance / Entrance "A" at 8:15 AM.  Call this number if you have problems the morning of surgery: [phone]   Remember:   Do not eat food or drink liquids after midnight.   Take these medicines the morning of surgery with A SIP OF WATER: DULoxetine (CYMBALTA),,gabapentin (NEURONTIN),omeprazole (PRILOSEC).  [id number] (NASONEX).  Stop taking Aspirin, Coumadin, Plavix, Effient and Herbal medications.  Do not take any NSAIDs ie: Ibuprofen,  Advil,Naproxen or any medication containing Aspirin.    Do not wear jewelry, make-up or nail polish.  Do not wear lotions, powders, or perfumes. You may wear deodorant.  Do not shave 48 hours prior to surgery.   Do not bring valuables to the hospital.  [facility] is not responsible for any belongings or valuables.  Contacts, dentures or bridgework may not be worn into surgery.  Leave suitcase in the car. After surgery it may be brought to your room.  For patients admitted to the hospital, checkout time is 11:00 AM the day of discharge.     Special Instructions: Shower using CHG 2 nights before surgery and the night before surgery.  If you shower the day of surgery use CHG.  Use special wash - you have one bottle of CHG for all showers.  You should use approximately 1/3 of the bottle for each shower.   Please read over the following fact sheets that you were given: Pain Booklet, Coughing and Deep Breathing, Blood Transfusion Information and Surgical Site Infection Prevention

## 2013-01-23 ENCOUNTER — Encounter (HOSPITAL_COMMUNITY)
Admission: RE | Admit: 2013-01-23 | Discharge: 2013-01-23 | Disposition: A | Discharge status: Home / Self Care | Payer: BC Managed Care – PPO | Source: Ambulatory Visit | Attending: Orthopedic Surgery | Admitting: Orthopedic Surgery

## 2013-01-23 ENCOUNTER — Other Ambulatory Visit: Payer: Self-pay | Admitting: Physician Assistant

## 2013-01-23 ENCOUNTER — Encounter (HOSPITAL_COMMUNITY): Payer: Self-pay

## 2013-01-23 DIAGNOSIS — Z01812 Encounter for preprocedural laboratory examination: Secondary | ICD-10-CM | POA: Insufficient documentation

## 2013-01-23 DIAGNOSIS — Z01818 Encounter for other preprocedural examination: Secondary | ICD-10-CM | POA: Insufficient documentation

## 2013-01-23 HISTORY — DX: Polyneuropathy, unspecified: G62.9

## 2013-01-23 HISTORY — DX: Gastro-esophageal reflux disease without esophagitis: K21.9

## 2013-01-23 LAB — COMPREHENSIVE METABOLIC PANEL
ALT: 18 U/L (ref 0–35)
Albumin: 4.4 g/dL (ref 3.5–5.2)
Alkaline Phosphatase: 66 U/L (ref 39–117)
Calcium: 11.2 mg/dL — ABNORMAL HIGH (ref 8.4–10.5)
GFR calc Af Amer: >90 mL/min (ref >90)
Glucose, Bld: 103 mg/dL — ABNORMAL HIGH (ref 70–99)
Potassium: 4.8 mEq/L (ref 3.5–5.1)
Sodium: 135 mEq/L (ref 135–145)
Total Protein: 7.6 g/dL (ref 6.0–8.3)

## 2013-01-23 LAB — URINALYSIS, ROUTINE W REFLEX MICROSCOPIC
Glucose, UA: NEGATIVE mg/dL
Ketones, ur: NEGATIVE mg/dL
Nitrite: NEGATIVE
Specific Gravity, Urine: 1.017 (ref 1.005–1.030)
pH: 7 (ref 5.0–8.0)

## 2013-01-23 LAB — TYPE AND SCREEN
ABO/RH(D): O POS
Antibody Screen: NEGATIVE

## 2013-01-23 LAB — CBC WITH DIFFERENTIAL/PLATELET
Basophils Relative: 0 % (ref 0–1)
Eosinophils Absolute: 0.3 10*3/uL (ref 0.0–0.7)
Eosinophils Relative: 2 % (ref 0–5)
MCH: 33.6 pg (ref 26.0–34.0)
MCHC: 35.7 g/dL (ref 30.0–36.0)
MCV: 94.1 fL (ref 78.0–100.0)
Neutrophils Relative %: 75 % (ref 43–77)
Platelets: 359 10*3/uL (ref 150–400)
RDW: 12.2 % (ref 11.5–15.5)

## 2013-01-23 NOTE — Progress Notes (Signed)
WBC is 19.1, Urine shows bacteria, I notified Margart Sickles , PA with Dr Madelon Lips.

## 2013-01-24 LAB — URINE CULTURE: Colony Count: 2000

## 2013-01-24 NOTE — Progress Notes (Signed)
Anesthesia Chart Review:  Patient is a 47 year old female scheduled for right TKA on 01/26/13 by Dr. Madelon Lips.  She was initially scheduled for 01/12/13 but was cancelled due to leukocytosis and was treated for sinusitis.  Other history includes obesity, smoking, HTN, anxiety, depression, GERD, HLD, hysterectomy, back surgery with resulting neuropathy, ETOH abuse (sober since 2006).  PCP is Dr. Lesle Chris who re-evaluated her on 01/15/13 for preoperative clearance. He repeated a CBC with differential because of her history of intermittent leukocytosis. Results on 01/15/13 showed a WBC of 12.5K. Pathology review showed absolute neutrophilia with mild left shift in maturation--favor reactive process but clinical correlation was recommended.  EKG on 12/19/12 showed SR, low QRS voltage in limb leads, old anteroseptal infarct.  CXR on 01/04/13 showed no acute abnormality.  Preoperative labs noted.  WBC 19.1.  Dr. Candise Bowens PA has already been notified.  Urine culture showed insignificant growth.  PT/PTT were not done at PAT, so will need to be done on the day of surgery.   Surgeon is aware of leukocytosis.  Will plan to repeat CBC on arrival to re-evaluate, otherwise I will defer additional orders to surgeon.  She will also be evaluated by her surgeon and assigned anesthesiologist on the day of surgery to ensure no obvious s/s of infection. Her previous CXR last month and recent urine culture are unremarkable.      Velna Ochs [facility]/Anesthesiology Phone [phone] 01/24/2013 4:54 PM

## 2013-01-24 NOTE — Progress Notes (Signed)
Error

## 2013-01-25 MED ORDER — CHLORHEXIDINE GLUCONATE 4 % EX LIQD: 60.0000 mL | Freq: Once | CUTANEOUS | Status: DC

## 2013-01-25 MED ORDER — CEFAZOLIN SODIUM-DEXTROSE 2-3 GM-% IV SOLR
2.0000 g | INTRAVENOUS | Status: AC
  Administered 2013-01-26: 2 g via INTRAVENOUS
  Filled 2013-01-25: qty 50

## 2013-01-26 ENCOUNTER — Encounter (HOSPITAL_COMMUNITY): Payer: Self-pay | Admitting: Vascular Surgery

## 2013-01-26 ENCOUNTER — Inpatient Hospital Stay (HOSPITAL_COMMUNITY)
Admission: RE | Admit: 2013-01-26 | Discharge: 2013-01-29 | DRG: 209 | Disposition: A | Discharge status: Home Health | Payer: BC Managed Care – PPO | Source: Ambulatory Visit | Attending: Orthopedic Surgery | Admitting: Orthopedic Surgery

## 2013-01-26 ENCOUNTER — Encounter (HOSPITAL_COMMUNITY): Payer: Self-pay | Admitting: *Deleted

## 2013-01-26 ENCOUNTER — Inpatient Hospital Stay (HOSPITAL_COMMUNITY): Payer: BC Managed Care – PPO | Admitting: Certified Registered"

## 2013-01-26 ENCOUNTER — Encounter (HOSPITAL_COMMUNITY)
Admission: RE | Disposition: A | Discharge status: Home Health | Payer: Self-pay | Source: Ambulatory Visit | Attending: Orthopedic Surgery

## 2013-01-26 DIAGNOSIS — Z23 Encounter for immunization: Secondary | ICD-10-CM

## 2013-01-26 DIAGNOSIS — Z720 Tobacco use: Secondary | ICD-10-CM | POA: Diagnosis present

## 2013-01-26 DIAGNOSIS — E8881 Metabolic syndrome: Secondary | ICD-10-CM | POA: Diagnosis present

## 2013-01-26 DIAGNOSIS — F1011 Alcohol abuse, in remission: Secondary | ICD-10-CM | POA: Diagnosis present

## 2013-01-26 DIAGNOSIS — Z7901 Long term (current) use of anticoagulants: Secondary | ICD-10-CM

## 2013-01-26 DIAGNOSIS — E669 Obesity, unspecified: Secondary | ICD-10-CM | POA: Diagnosis present

## 2013-01-26 DIAGNOSIS — Z79899 Other long term (current) drug therapy: Secondary | ICD-10-CM

## 2013-01-26 DIAGNOSIS — Z8249 Family history of ischemic heart disease and other diseases of the circulatory system: Secondary | ICD-10-CM

## 2013-01-26 DIAGNOSIS — M1711 Unilateral primary osteoarthritis, right knee: Secondary | ICD-10-CM | POA: Diagnosis present

## 2013-01-26 DIAGNOSIS — B373 Candidiasis of vulva and vagina: Secondary | ICD-10-CM | POA: Diagnosis present

## 2013-01-26 DIAGNOSIS — Z6836 Body mass index (BMI) 36.0-36.9, adult: Secondary | ICD-10-CM

## 2013-01-26 DIAGNOSIS — B3731 Acute candidiasis of vulva and vagina: Secondary | ICD-10-CM | POA: Diagnosis present

## 2013-01-26 DIAGNOSIS — Z888 Allergy status to other drugs, medicaments and biological substances status: Secondary | ICD-10-CM

## 2013-01-26 DIAGNOSIS — F39 Unspecified mood [affective] disorder: Secondary | ICD-10-CM | POA: Diagnosis present

## 2013-01-26 DIAGNOSIS — F411 Generalized anxiety disorder: Secondary | ICD-10-CM | POA: Diagnosis present

## 2013-01-26 DIAGNOSIS — K219 Gastro-esophageal reflux disease without esophagitis: Secondary | ICD-10-CM | POA: Diagnosis present

## 2013-01-26 DIAGNOSIS — M1710 Unilateral primary osteoarthritis, unspecified knee: Principal | ICD-10-CM | POA: Diagnosis present

## 2013-01-26 DIAGNOSIS — Z01812 Encounter for preprocedural laboratory examination: Secondary | ICD-10-CM

## 2013-01-26 DIAGNOSIS — I10 Essential (primary) hypertension: Secondary | ICD-10-CM | POA: Diagnosis present

## 2013-01-26 DIAGNOSIS — F17200 Nicotine dependence, unspecified, uncomplicated: Secondary | ICD-10-CM | POA: Diagnosis present

## 2013-01-26 DIAGNOSIS — E785 Hyperlipidemia, unspecified: Secondary | ICD-10-CM | POA: Diagnosis present

## 2013-01-26 DIAGNOSIS — G8929 Other chronic pain: Secondary | ICD-10-CM | POA: Diagnosis present

## 2013-01-26 HISTORY — DX: Unilateral primary osteoarthritis, right knee: M17.11

## 2013-01-26 HISTORY — PX: TOTAL KNEE ARTHROPLASTY: SHX125

## 2013-01-26 LAB — DIFFERENTIAL
Basophils Absolute: 0.1 10*3/uL (ref 0.0–0.1)
Eosinophils Absolute: 0.3 10*3/uL (ref 0.0–0.7)
Eosinophils Relative: 2 % (ref 0–5)

## 2013-01-26 LAB — CBC
HCT: 35.2 % — ABNORMAL LOW (ref 36.0–46.0)
MCH: 32.9 pg (ref 26.0–34.0)
MCHC: 34.9 g/dL (ref 30.0–36.0)
MCV: 94.2 fL (ref 78.0–100.0)
Platelets: 283 10*3/uL (ref 150–400)
RBC: 3.72 MIL/uL — ABNORMAL LOW (ref 3.87–5.11)
RDW: 12.1 % (ref 11.5–15.5)
RDW: 12.2 % (ref 11.5–15.5)
WBC: 25.5 10*3/uL — ABNORMAL HIGH (ref 4.0–10.5)

## 2013-01-26 LAB — CREATININE, SERUM
GFR calc Af Amer: >90 mL/min (ref >90)
GFR calc non Af Amer: >90 mL/min (ref >90)

## 2013-01-26 LAB — APTT: aPTT: 33 seconds (ref 24–37)

## 2013-01-26 SURGERY — ARTHROPLASTY, KNEE, TOTAL
Anesthesia: General | Site: Knee | Laterality: Right | Wound class: Clean

## 2013-01-26 MED ORDER — ENOXAPARIN SODIUM 30 MG/0.3ML ~~LOC~~ SOLN: 30.0000 mg | Freq: Two times a day (BID) | SUBCUTANEOUS | Status: DC

## 2013-01-26 MED ORDER — SODIUM CHLORIDE 0.9 % IV SOLN: INTRAVENOUS | Status: DC

## 2013-01-26 MED ORDER — MIDAZOLAM HCL 2 MG/2ML IJ SOLN
INTRAMUSCULAR | Status: AC
  Administered 2013-01-26: 2 mg
  Filled 2013-01-26: qty 2

## 2013-01-26 MED ORDER — TRAZODONE HCL 150 MG PO TABS
300.0000 mg | ORAL_TABLET | Freq: Every day | ORAL | Status: DC
  Administered 2013-01-26 – 2013-01-28 (×3): 300 mg via ORAL
  Filled 2013-01-26 (×4): qty 2

## 2013-01-26 MED ORDER — OXYCODONE HCL 5 MG PO TABS: 5.0000 mg | ORAL_TABLET | Freq: Once | ORAL | Status: DC | PRN

## 2013-01-26 MED ORDER — SODIUM CHLORIDE 0.9 % IV SOLN
1000.0000 mg | INTRAVENOUS | Status: AC
  Administered 2013-01-26: 1000 mg via INTRAVENOUS
  Filled 2013-01-26: qty 10

## 2013-01-26 MED ORDER — FENTANYL CITRATE 0.05 MG/ML IJ SOLN
INTRAMUSCULAR | Status: DC | PRN
  Administered 2013-01-26 (×3): 100 ug via INTRAVENOUS
  Administered 2013-01-26 (×2): 50 ug via INTRAVENOUS
  Administered 2013-01-26 (×2): 100 ug via INTRAVENOUS
  Administered 2013-01-26 (×3): 50 ug via INTRAVENOUS

## 2013-01-26 MED ORDER — FENTANYL CITRATE 0.05 MG/ML IJ SOLN
INTRAMUSCULAR | Status: AC
  Administered 2013-01-26: 100 ug
  Filled 2013-01-26: qty 2

## 2013-01-26 MED ORDER — HYDROMORPHONE HCL PF 1 MG/ML IJ SOLN
INTRAMUSCULAR | Status: AC
  Administered 2013-01-26: 0.5 mg via INTRAVENOUS
  Filled 2013-01-26: qty 1

## 2013-01-26 MED ORDER — LISINOPRIL 20 MG PO TABS
20.0000 mg | ORAL_TABLET | Freq: Every day | ORAL | Status: DC
  Filled 2013-01-26 (×2): qty 1

## 2013-01-26 MED ORDER — ENOXAPARIN SODIUM 30 MG/0.3ML ~~LOC~~ SOLN
30.0000 mg | Freq: Two times a day (BID) | SUBCUTANEOUS | Status: DC
  Administered 2013-01-27 – 2013-01-29 (×5): 30 mg via SUBCUTANEOUS
  Filled 2013-01-26 (×8): qty 0.3

## 2013-01-26 MED ORDER — BISACODYL 10 MG RE SUPP: 10.0000 mg | Freq: Every day | RECTAL | Status: DC | PRN

## 2013-01-26 MED ORDER — SENNOSIDES-DOCUSATE SODIUM 8.6-50 MG PO TABS: 1.0000 | ORAL_TABLET | Freq: Every evening | ORAL | Status: DC | PRN

## 2013-01-26 MED ORDER — OXYCODONE HCL 5 MG PO TABS
5.0000 mg | ORAL_TABLET | ORAL | Status: DC | PRN
  Administered 2013-01-26 (×2): 5 mg via ORAL
  Administered 2013-01-26 – 2013-01-27 (×4): 10 mg via ORAL
  Filled 2013-01-26 (×5): qty 2

## 2013-01-26 MED ORDER — LACTATED RINGERS IV SOLN
INTRAVENOUS | Status: DC
  Administered 2013-01-26: 10:00:00 via INTRAVENOUS

## 2013-01-26 MED ORDER — GABAPENTIN 600 MG PO TABS
600.0000 mg | ORAL_TABLET | Freq: Four times a day (QID) | ORAL | Status: DC
  Administered 2013-01-26 – 2013-01-29 (×13): 600 mg via ORAL
  Filled 2013-01-26 (×16): qty 1

## 2013-01-26 MED ORDER — HYDROMORPHONE HCL PF 1 MG/ML IJ SOLN
INTRAMUSCULAR | Status: AC
  Administered 2013-01-26: 1 mg via INTRAVENOUS
  Filled 2013-01-26: qty 1

## 2013-01-26 MED ORDER — LACTATED RINGERS IV SOLN
INTRAVENOUS | Status: DC | PRN
  Administered 2013-01-26 (×2): via INTRAVENOUS

## 2013-01-26 MED ORDER — OXYCODONE HCL 5 MG PO TABS
ORAL_TABLET | ORAL | Status: AC
  Filled 2013-01-26: qty 2

## 2013-01-26 MED ORDER — METHOCARBAMOL 500 MG PO TABS
ORAL_TABLET | ORAL | Status: AC
  Administered 2013-01-26: 500 mg via ORAL
  Filled 2013-01-26: qty 1

## 2013-01-26 MED ORDER — DOCUSATE SODIUM 100 MG PO CAPS
100.0000 mg | ORAL_CAPSULE | Freq: Two times a day (BID) | ORAL | Status: DC
  Administered 2013-01-26 – 2013-01-29 (×6): 100 mg via ORAL
  Filled 2013-01-26 (×6): qty 1

## 2013-01-26 MED ORDER — HYDROMORPHONE HCL PF 1 MG/ML IJ SOLN
1.0000 mg | INTRAMUSCULAR | Status: DC | PRN
  Administered 2013-01-26 – 2013-01-27 (×9): 1 mg via INTRAVENOUS
  Filled 2013-01-26 (×10): qty 1

## 2013-01-26 MED ORDER — OXYCODONE HCL 5 MG/5ML PO SOLN: 5.0000 mg | Freq: Once | ORAL | Status: DC | PRN

## 2013-01-26 MED ORDER — METHOCARBAMOL 100 MG/ML IJ SOLN
500.0000 mg | Freq: Four times a day (QID) | INTRAVENOUS | Status: DC | PRN
  Filled 2013-01-26: qty 5

## 2013-01-26 MED ORDER — LISINOPRIL-HYDROCHLOROTHIAZIDE 20-25 MG PO TABS: 1.0000 | ORAL_TABLET | Freq: Every day | ORAL | Status: DC

## 2013-01-26 MED ORDER — DULOXETINE HCL 60 MG PO CPEP
60.0000 mg | ORAL_CAPSULE | Freq: Two times a day (BID) | ORAL | Status: DC
  Administered 2013-01-26 – 2013-01-29 (×6): 60 mg via ORAL
  Filled 2013-01-26 (×7): qty 1

## 2013-01-26 MED ORDER — ONDANSETRON HCL 4 MG/2ML IJ SOLN: 4.0000 mg | Freq: Four times a day (QID) | INTRAMUSCULAR | Status: DC | PRN

## 2013-01-26 MED ORDER — ONDANSETRON HCL 4 MG/2ML IJ SOLN
INTRAMUSCULAR | Status: DC | PRN
  Administered 2013-01-26: 4 mg via INTRAVENOUS

## 2013-01-26 MED ORDER — HYDROCHLOROTHIAZIDE 25 MG PO TABS
25.0000 mg | ORAL_TABLET | Freq: Every day | ORAL | Status: DC
  Filled 2013-01-26 (×2): qty 1

## 2013-01-26 MED ORDER — CEFAZOLIN SODIUM 1-5 GM-% IV SOLN
1.0000 g | Freq: Four times a day (QID) | INTRAVENOUS | Status: AC
  Administered 2013-01-26 (×2): 1 g via INTRAVENOUS
  Filled 2013-01-26 (×3): qty 50

## 2013-01-26 MED ORDER — HYDROMORPHONE HCL PF 1 MG/ML IJ SOLN
0.2500 mg | INTRAMUSCULAR | Status: DC | PRN
  Administered 2013-01-26: 1 mg via INTRAVENOUS
  Administered 2013-01-26 (×2): 0.5 mg via INTRAVENOUS

## 2013-01-26 MED ORDER — PROMETHAZINE HCL 25 MG/ML IJ SOLN: 6.2500 mg | INTRAMUSCULAR | Status: DC | PRN

## 2013-01-26 MED ORDER — ONDANSETRON HCL 4 MG PO TABS
4.0000 mg | ORAL_TABLET | Freq: Four times a day (QID) | ORAL | Status: DC | PRN
  Administered 2013-01-28: 4 mg via ORAL

## 2013-01-26 MED ORDER — MENTHOL 3 MG MT LOZG: 1.0000 | LOZENGE | OROMUCOSAL | Status: DC | PRN

## 2013-01-26 MED ORDER — ACETAMINOPHEN 650 MG RE SUPP: 650.0000 mg | Freq: Four times a day (QID) | RECTAL | Status: DC | PRN

## 2013-01-26 MED ORDER — OXYCODONE HCL 5 MG PO TABS: ORAL_TABLET | ORAL | Status: DC

## 2013-01-26 MED ORDER — ROCURONIUM BROMIDE 100 MG/10ML IV SOLN
INTRAVENOUS | Status: DC | PRN
  Administered 2013-01-26: 50 mg via INTRAVENOUS

## 2013-01-26 MED ORDER — INFLUENZA VAC SPLIT QUAD 0.5 ML IM SUSP
0.5000 mL | INTRAMUSCULAR | Status: AC
  Filled 2013-01-26: qty 0.5

## 2013-01-26 MED ORDER — NEOSTIGMINE METHYLSULFATE 1 MG/ML IJ SOLN
INTRAMUSCULAR | Status: DC | PRN
  Administered 2013-01-26: 3 mg via INTRAVENOUS

## 2013-01-26 MED ORDER — PANTOPRAZOLE SODIUM 40 MG PO TBEC
40.0000 mg | DELAYED_RELEASE_TABLET | Freq: Every day | ORAL | Status: DC
  Administered 2013-01-26 – 2013-01-29 (×2): 40 mg via ORAL
  Filled 2013-01-26 (×2): qty 1

## 2013-01-26 MED ORDER — METHOCARBAMOL 500 MG PO TABS
500.0000 mg | ORAL_TABLET | Freq: Four times a day (QID) | ORAL | Status: DC | PRN
  Administered 2013-01-26 – 2013-01-28 (×5): 500 mg via ORAL
  Filled 2013-01-26 (×4): qty 1

## 2013-01-26 MED ORDER — GLYCOPYRROLATE 0.2 MG/ML IJ SOLN
INTRAMUSCULAR | Status: DC | PRN
  Administered 2013-01-26: 0.4 mg via INTRAVENOUS

## 2013-01-26 MED ORDER — ACETAMINOPHEN 325 MG PO TABS
650.0000 mg | ORAL_TABLET | Freq: Four times a day (QID) | ORAL | Status: DC | PRN
  Administered 2013-01-27 – 2013-01-28 (×3): 650 mg via ORAL
  Filled 2013-01-26 (×4): qty 2

## 2013-01-26 MED ORDER — FLEET ENEMA 7-19 GM/118ML RE ENEM: 1.0000 | ENEMA | Freq: Once | RECTAL | Status: AC | PRN

## 2013-01-26 MED ORDER — PHENOL 1.4 % MT LIQD
1.0000 | OROMUCOSAL | Status: DC | PRN
Start: 2013-01-26 — End: 2013-01-29

## 2013-01-26 MED ORDER — PROPOFOL 10 MG/ML IV BOLUS
INTRAVENOUS | Status: DC | PRN
  Administered 2013-01-26: 200 mg via INTRAVENOUS

## 2013-01-26 MED ORDER — SODIUM CHLORIDE 0.9 % IV SOLN
INTRAVENOUS | Status: DC
  Administered 2013-01-26 – 2013-01-27 (×2): via INTRAVENOUS

## 2013-01-26 MED ORDER — SODIUM CHLORIDE 0.9 % IR SOLN
Status: DC | PRN
  Administered 2013-01-26: 1000 mL
  Administered 2013-01-26: 3000 mL

## 2013-01-26 SURGICAL SUPPLY — 64 items
BANDAGE ELASTIC 4 VELCRO ST LF (GAUZE/BANDAGES/DRESSINGS) ×2 IMPLANT
BANDAGE ELASTIC 6 VELCRO ST LF (GAUZE/BANDAGES/DRESSINGS) ×2 IMPLANT
BANDAGE ESMARK 6X9 LF (GAUZE/BANDAGES/DRESSINGS) ×1 IMPLANT
BLADE SAGITTAL 25.0X1.19X90 (BLADE) ×2 IMPLANT
BLADE SAW SAG 90X13X1.27 (BLADE) ×2 IMPLANT
BNDG CMPR 9X6 STRL LF SNTH (GAUZE/BANDAGES/DRESSINGS) ×1
BNDG ESMARK 6X9 LF (GAUZE/BANDAGES/DRESSINGS) ×2
BONE CEMENT GENTAMICIN (Cement) ×4 IMPLANT
BOWL SMART MIX CTS (DISPOSABLE) ×2 IMPLANT
CAPT RP KNEE ×2 IMPLANT
CEMENT BONE GENTAMICIN 40 (Cement) ×2 IMPLANT
CLOTH BEACON ORANGE TIMEOUT ST (SAFETY) ×2 IMPLANT
COVER SURGICAL LIGHT HANDLE (MISCELLANEOUS) ×2 IMPLANT
CUFF TOURNIQUET SINGLE 34IN LL (TOURNIQUET CUFF) ×2 IMPLANT
CUFF TOURNIQUET SINGLE 44IN (TOURNIQUET CUFF) IMPLANT
DRAPE INCISE IOBAN 66X45 STRL (DRAPES) IMPLANT
DRAPE ORTHO SPLIT 77X108 STRL (DRAPES) ×4
DRAPE SURG ORHT 6 SPLT 77X108 (DRAPES) ×2 IMPLANT
DRAPE U-SHAPE 47X51 STRL (DRAPES) ×2 IMPLANT
DRSG ADAPTIC 3X8 NADH LF (GAUZE/BANDAGES/DRESSINGS) ×2 IMPLANT
DRSG PAD ABDOMINAL 8X10 ST (GAUZE/BANDAGES/DRESSINGS) ×2 IMPLANT
DURAPREP 26ML APPLICATOR (WOUND CARE) ×2 IMPLANT
ELECT REM PT RETURN 9FT ADLT (ELECTROSURGICAL) ×2
ELECTRODE REM PT RTRN 9FT ADLT (ELECTROSURGICAL) ×1 IMPLANT
EVACUATOR 1/8 PVC DRAIN (DRAIN) ×2 IMPLANT
FACESHIELD LNG OPTICON STERILE (SAFETY) ×4 IMPLANT
FLOSEAL 10ML (HEMOSTASIS) IMPLANT
GLOVE BIOGEL PI IND STRL 6.5 (GLOVE) ×1 IMPLANT
GLOVE BIOGEL PI IND STRL 8 (GLOVE) ×4 IMPLANT
GLOVE BIOGEL PI INDICATOR 6.5 (GLOVE) ×1
GLOVE BIOGEL PI INDICATOR 8 (GLOVE) ×4
GLOVE ORTHO TXT STRL SZ7.5 (GLOVE) ×6 IMPLANT
GLOVE SURG ORTHO 8.0 STRL STRW (GLOVE) ×6 IMPLANT
GLOVE SURG SS PI 6.5 STRL IVOR (GLOVE) ×2 IMPLANT
GOWN PREVENTION PLUS XLARGE (GOWN DISPOSABLE) ×2 IMPLANT
GOWN PREVENTION PLUS XXLARGE (GOWN DISPOSABLE) ×2 IMPLANT
GOWN STRL NON-REIN LRG LVL3 (GOWN DISPOSABLE) ×4 IMPLANT
HANDPIECE INTERPULSE COAX TIP (DISPOSABLE) ×2
HOOD PEEL AWAY FACE SHEILD DIS (HOOD) ×2 IMPLANT
IMMOBILIZER KNEE 22 UNIV (SOFTGOODS) ×2 IMPLANT
KIT BASIN OR (CUSTOM PROCEDURE TRAY) ×2 IMPLANT
KIT ROOM TURNOVER OR (KITS) ×2 IMPLANT
MANIFOLD NEPTUNE II (INSTRUMENTS) ×2 IMPLANT
NEEDLE 22X1 1/2 (OR ONLY) (NEEDLE) IMPLANT
NS IRRIG 1000ML POUR BTL (IV SOLUTION) ×2 IMPLANT
PACK TOTAL JOINT (CUSTOM PROCEDURE TRAY) ×2 IMPLANT
PAD ARMBOARD 7.5X6 YLW CONV (MISCELLANEOUS) ×4 IMPLANT
PAD CAST 4YDX4 CTTN HI CHSV (CAST SUPPLIES) ×1 IMPLANT
PADDING CAST COTTON 4X4 STRL (CAST SUPPLIES) ×2
PADDING CAST COTTON 6X4 STRL (CAST SUPPLIES) ×2 IMPLANT
SET HNDPC FAN SPRY TIP SCT (DISPOSABLE) ×1 IMPLANT
SPONGE GAUZE 4X4 12PLY (GAUZE/BANDAGES/DRESSINGS) ×2 IMPLANT
STAPLER VISISTAT 35W (STAPLE) ×2 IMPLANT
SUCTION FRAZIER TIP 10 FR DISP (SUCTIONS) ×2 IMPLANT
SUT ETHIBOND NAB CT1 #1 30IN (SUTURE) ×6 IMPLANT
SUT VIC AB 0 CT1 27 (SUTURE) ×1
SUT VIC AB 0 CT1 27XBRD ANBCTR (SUTURE) ×1 IMPLANT
SUT VIC AB 2-0 CT1 27 (SUTURE) ×4
SUT VIC AB 2-0 CT1 TAPERPNT 27 (SUTURE) ×2 IMPLANT
SYR CONTROL 10ML LL (SYRINGE) IMPLANT
TOWEL OR 17X24 6PK STRL BLUE (TOWEL DISPOSABLE) ×2 IMPLANT
TOWEL OR 17X26 10 PK STRL BLUE (TOWEL DISPOSABLE) ×2 IMPLANT
TRAY FOLEY CATH 16FRSI W/METER (SET/KITS/TRAYS/PACK) ×2 IMPLANT
WATER STERILE IRR 1000ML POUR (IV SOLUTION) ×2 IMPLANT

## 2013-01-26 NOTE — Anesthesia Preprocedure Evaluation (Addendum)
Anesthesia Evaluation  Patient identified by MRN, date of birth, ID band Patient awake    Reviewed: Allergy & Precautions, H&P , NPO status , Patient's Chart, lab work & pertinent test results  History of Anesthesia Complications Negative for: history of anesthetic complications  Airway Mallampati: II TM Distance: >3 FB Neck ROM: Full    Dental  (+) Teeth Intact and Dental Advisory Given   Pulmonary neg pulmonary ROS,    Pulmonary exam normal       Cardiovascular hypertension, Pt. on medications     Neuro/Psych PSYCHIATRIC DISORDERS Anxiety Depression negative neurological ROS     GI/Hepatic Neg liver ROS, GERD-  Controlled,  Endo/Other  negative endocrine ROS  Renal/GU negative Renal ROS     Musculoskeletal   Abdominal   Peds  Hematology negative hematology ROS (+)   Anesthesia Other Findings   Reproductive/Obstetrics                          Anesthesia Physical Anesthesia Plan  ASA: II  Anesthesia Plan: General   Post-op Pain Management:    Induction: Intravenous  Airway Management Planned: LMA  Additional Equipment:   Intra-op Plan:   Post-operative Plan: Extubation in OR  Informed Consent: I have reviewed the patients History and Physical, chart, labs and discussed the procedure including the risks, benefits and alternatives for the proposed anesthesia with the patient or authorized representative who has indicated his/her understanding and acceptance.   Dental advisory given  Plan Discussed with: CRNA, Anesthesiologist and Surgeon  Anesthesia Plan Comments:        Anesthesia Quick Evaluation

## 2013-01-26 NOTE — H&P (Signed)
TOTAL KNEE ADMISSION H&P  Patient is being admitted for right total knee arthroplasty.  Subjective:  Chief Complaint:right knee pain.  HPI: Brittney Sawyer, 47 y.o. female, has a history of pain and functional disability in the right knee due to arthritis and has failed non-surgical conservative treatments for greater than 12 weeks to includeNSAID's and/or analgesics, corticosteriod injections, viscosupplementation injections and activity modification. Onset of symptoms was gradual, starting 5 years ago with gradually worsening course since that time. The patient noted no past surgery on the right knee(s). Patient currently rates pain in the right knee(s) at 10 out of 10 with activity. Patient has night pain, worsening of pain with activity and weight bearing, pain that interferes with activities of daily living, pain with passive range of motion, crepitus and joint swelling. Patient has evidence of periarticular osteophytes and joint space narrowing by imaging studies. There is no active infection.  Patient Active Problem List    Diagnosis  Date Noted   .  Metabolic syndrome  12/20/2012   .  Obesity (BMI 35.0-39.9 without comorbidity)  12/19/2012   .  History of substance abuse  01/07/2012   .  Menopause  11/25/2011   .  Tobacco user  11/25/2011   .  DDD (degenerative disc disease), lumbar  11/25/2011   .  HTN (hypertension)  10/21/2011   .  Mood disorder  10/21/2011   .  Chronic pain  10/21/2011    Past Medical History   Diagnosis  Date   .  Depression    .  Anxiety    .  Hyperlipidemia    .  Hypertension    .  Diabetes mellitus    .  Substance abuse      Alcohol - sober since 2006    Past Surgical History   Procedure  Laterality  Date   .  Abdominal hysterectomy     .  Spine surgery     (Not in a hospital admission)  Allergies   Allergen  Reactions   .  Celebrex [Celecoxib]  Swelling   .  Wellbutrin [Bupropion]  Rash    History   Substance Use Topics   .  Smoking status:   Current Every Day Smoker -- 0.50 packs/day     Types:  Cigarettes   .  Smokeless tobacco:  Never Used   .  Alcohol Use:  Yes      Comment: consumed ETOH previous couple days.Pt is a recovering alcoholic and has been sober since ~2006.    Family History   Problem  Relation  Age of Onset   .  Thyroid disease  Mother    .  Hypertension  Father    Review of Systems  Constitutional: Negative.  HENT: Negative.  Eyes: Negative.  Respiratory: Negative.  Cardiovascular: Negative.  Gastrointestinal: Positive for diarrhea and constipation. Negative for heartburn, nausea, vomiting, abdominal pain, blood in stool and melena.  Genitourinary: burning, frequency.  Musculoskeletal: Positive for back pain and joint pain. Negative for falls.  Skin: Negative.  Neurological: Negative.  Endo/Heme/Allergies: Negative.  Psychiatric/Behavioral: Positive for depression. Negative for suicidal ideas. The patient is nervous/anxious.  Objective:  Physical Exam  Constitutional: She is oriented to person, place, and time. She appears well-developed and well-nourished. No distress.  HENT:  Head: Normocephalic and atraumatic.  Nose: Nose normal.  Eyes: Conjunctivae and EOM are normal. Pupils are equal, round, and reactive to light.  Neck: Normal range of motion. Neck supple.  Cardiovascular: Normal rate,  regular rhythm, normal heart sounds and intact distal pulses.  No murmur heard.  Respiratory: Effort normal and breath sounds normal. No respiratory distress. She has no wheezes. She has no rales. She exhibits no tenderness.  GI: Soft. Bowel sounds are normal. She exhibits no distension. There is no tenderness.  Musculoskeletal:  Intact DF/PF ankle, SILT all dermatomes, no calf tenderness to palpation, joint line tenderness knee, varus knee, stable ligamentous testing, positive patellofemoral crepitus, no swelling or effusion  Lymphadenopathy:  She has no cervical adenopathy.  Neurological: She is alert and  oriented to person, place, and time. No cranial nerve deficit.  Skin: Skin is warm and dry. No rash noted. No erythema.  Psychiatric: She has a normal mood and affect. Her behavior is normal.  Vital signs in last 24 hours:  @VSRANGES @  Labs:  Estimated body mass index is 36.87 kg/(m^2) as calculated from the following:  Height as of 12/19/12: 5' 0.5" (1.537 m).  Weight as of 12/19/12: 87.091 kg (192 lb).  Imaging Review  Plain radiographs demonstrate moderate degenerative joint disease of the right knee(s). The overall alignment ismild varus. The bone quality appears to be good for age and reported activity level.  Assessment/Plan:  End stage arthritis, right knee  The patient history, physical examination, clinical judgment of the provider and imaging studies are consistent with end stage degenerative joint disease of the right knee(s) and total knee arthroplasty is deemed medically necessary. The treatment options including medical management, injection therapy arthroscopy and arthroplasty were discussed at length. The risks and benefits of total knee arthroplasty were presented and reviewed. The risks due to aseptic loosening, infection, stiffness, patella tracking problems, thromboembolic complications and other imponderables were discussed. The patient acknowledged the explanation, agreed to proceed with the plan and consent was signed. Patient is being admitted for inpatient treatment for surgery, pain control, PT, OT, prophylactic antibiotics, VTE prophylaxis, progressive ambulation and ADL's and discharge planning. The patient is planning to be discharged home with home health services, antibiotic cement with surgery

## 2013-01-26 NOTE — Preoperative (Signed)
Beta Blockers   Reason not to administer Beta Blockers:Not Applicable 

## 2013-01-26 NOTE — Evaluation (Signed)
Physical Therapy Evaluation Patient Details Name: Brittney Sawyer MRN: [id number] DOB: [birth year] Today's Date: 01/26/2013 Time: 4098-1191 PT Time Calculation (min): 23 min  PT Assessment / Plan / Recommendation History of Present Illness  Patient is a 47 yo female s/p Rt TKA.  Clinical Impression  Patient presents with problems listed below.  Will benefit from acute PT to maximize independence prior to discharge home with husband.    PT Assessment  Patient needs continued PT services    Follow Up Recommendations  Home health PT;Supervision/Assistance - 24 hour    Does the patient have the potential to tolerate intense rehabilitation      Barriers to Discharge        Equipment Recommendations  Rolling walker with 5" wheels;3in1 (PT)    Recommendations for Other Services     Frequency 7X/week    Precautions / Restrictions Precautions Precautions: Knee Precaution Booklet Issued: Yes (comment) Precaution Comments: Reviewed precautions with patient and husband. Required Braces or Orthoses: Knee Immobilizer - Right Knee Immobilizer - Right: On when out of bed or walking Restrictions Weight Bearing Restrictions: Yes RLE Weight Bearing: Weight bearing as tolerated   Pertinent Vitals/Pain       Mobility  Bed Mobility Bed Mobility: Supine to Sit;Sitting - Scoot to Edge of Bed Supine to Sit: 4: Min assist;With rails Sitting - Scoot to [employer] of Bed: 4: Min guard Details for Bed Mobility Assistance: Instructed patient and husband to don KI on RLE.  Verbal cues for technique.  Assist to move RLE off of bed.  Good sitting balance at EOB. Transfers Transfers: Sit to Stand;Stand to [employer] Transfers Sit to Stand: 4: Min assist;With upper extremity assist;From bed Stand to Sit: 4: Min assist;With upper extremity assist;With armrests;To chair/3-in-1 Stand Pivot Transfers: 4: Min assist Details for Transfer Assistance: Verbal cues for hand placement and technique.  Assist to  move to standing and for balance.  Patient able to take several steps to pivot to chair. Ambulation/Gait Ambulation/Gait Assistance: Not tested (comment)    Exercises Total Joint Exercises Ankle Circles/Pumps: AROM;Both;10 reps;Seated   PT Diagnosis: Difficulty walking;Acute pain  PT Problem List: Decreased strength;Decreased range of motion;Decreased activity tolerance;Decreased balance;Decreased mobility;Decreased knowledge of use of DME;Decreased knowledge of precautions;Pain PT Treatment Interventions: DME instruction;Gait training;Stair training;Functional mobility training;Therapeutic exercise;Patient/family education     PT Goals(Current goals can be found in the care plan section) Acute Rehab PT Goals Patient Stated Goal: To be able to go home safely PT Goal Formulation: With patient/family Time For Goal Achievement: 02/02/13 Potential to Achieve Goals: Good  Visit Information  Last PT Received On: 01/26/13 Assistance Needed: +1 History of Present Illness: Patient is a 47 yo female s/p Rt TKA.       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  Able to assist moving RLE off of bed. RLE: Unable to fully assess due to pain Cervical / Trunk Assessment Cervical / Trunk Assessment: Normal   Balance Balance Balance Assessed: Yes Static Sitting Balance Static Sitting - Balance Support: No upper extremity supported;Feet supported Static Sitting - Level of Assistance: 5: Stand by assistance Static Sitting - Comment/# of Minutes: 3  End of Session PT - End of Session Equipment Utilized During Treatment: Gait belt;Right knee immobilizer Activity Tolerance: Patient limited by pain;Patient tolerated treatment well Patient left: in chair;with call bell/phone within reach;with family/visitor present Nurse Communication: Mobility status CPM Right Knee CPM Right Knee: Off  GP     Vena Austria 01/26/2013, 7:07 PM Durenda Hurt. Renaldo Fiddler, [facility] Acute Rehab Services Pager [phone]

## 2013-01-26 NOTE — Anesthesia Procedure Notes (Addendum)
Anesthesia Regional Block:  Femoral nerve block  Pre-Anesthetic Checklist: ,, timeout performed, Correct Patient, Correct Site, Correct Laterality, Correct Procedure,, site marked, risks and benefits discussed, Surgical consent,  Pre-op evaluation,  At surgeon's request and post-op pain management  Laterality: Right  Prep: chloraprep       Needles:  Injection technique: Single-shot  Needle Type: Echogenic Stimulator Needle     Needle Length: 5cm 5 cm Needle Gauge: 22 and 22 G    Additional Needles:  Procedures: ultrasound guided (picture in chart) and nerve stimulator Femoral nerve block  Nerve Stimulator or Paresthesia:  Response: quadraceps contraction, 0.45 mA,   Additional Responses:   Narrative:  Start time: 01/26/2013 9:38 AM End time: 01/26/2013 9:48 AM Injection made incrementally with aspirations every 5 mL.  Performed by: Personally  Anesthesiologist: Halford Decamp, MD  Additional Notes: Functioning IV was confirmed and monitors were applied.  A 50mm 22ga Arrow echogenic stimulator needle was used. Sterile prep and drape,hand hygiene and sterile gloves were used. Ultrasound guidance: relevant anatomy identified, needle position confirmed, local anesthetic spread visualized around nerve(s)., vascular puncture avoided.  Image printed for medical record. Negative aspiration and negative test dose prior to incremental administration of local anesthetic. The patient tolerated the procedure well.    Femoral nerve block Procedure Name: Intubation Date/Time: 01/26/2013 10:25 AM Performed by: Romie Minus K Pre-anesthesia Checklist: Patient identified, Emergency Drugs available, Suction available, Patient being monitored and Timeout performed Patient Re-evaluated:Patient Re-evaluated prior to inductionOxygen Delivery Method: Circle system utilized Preoxygenation: Pre-oxygenation with 100% oxygen Intubation Type: IV induction Ventilation: Mask ventilation without  difficulty Laryngoscope Size: Miller and 2 Grade View: Grade I Tube type: Oral Tube size: 7.0 mm Number of attempts: 1 Airway Equipment and Method: Stylet Placement Confirmation: ETT inserted through vocal cords under direct vision,  positive ETCO2,  CO2 detector and breath sounds checked- equal and bilateral Secured at: 21 cm Tube secured with: Tape Dental Injury: Teeth and Oropharynx as per pre-operative assessment

## 2013-01-26 NOTE — Progress Notes (Signed)
Orthopedic Tech Progress Note Patient Details:  Brittney Sawyer 05/07/1966 [id number] CPM applied to Right LE. OHF applied to bed.  CPM Right Knee CPM Right Knee: On Right Knee Flexion (Degrees): 60 Right Knee Extension (Degrees): 0   [name] 01/26/2013, 1:22 PM

## 2013-01-26 NOTE — Progress Notes (Signed)
Patient with post op elevated WBC.  Patient apparently was started on Cipro on Tuesday for a UTI, which was not communicated in report, nor on her med rec list.  Patient is afebrile and asymptomatic for urinary pain.  Now requesting Diflucan instead of Cipro.  Spoke with Montez Morita PA on call for Dr. Madelon Lips.  No new orders, will round on patient in am, will follow up on Diflucan need in am.

## 2013-01-26 NOTE — Transfer of Care (Signed)
Immediate Anesthesia Transfer of Care Note  Patient: Brittney Sawyer  Procedure(s) Performed: Procedure(s): TOTAL KNEE ARTHROPLASTY (Right)  Patient Location: PACU  Anesthesia Type:General  Level of Consciousness: awake, alert , oriented and patient cooperative  Airway & Oxygen Therapy: Patient Spontanous Breathing and Patient connected to nasal cannula oxygen  Post-op Assessment: Report given to PACU RN and Post -op Vital signs reviewed and stable  Post vital signs: Reviewed and stable  Complications: No apparent anesthesia complications

## 2013-01-26 NOTE — Progress Notes (Signed)
01/26/13 Patient set up with HHPT with Genevieve Norlander Hc by MD office. Jacquelynn Cree RN, BSN, CCM

## 2013-01-26 NOTE — Brief Op Note (Signed)
01/26/2013  12:23 PM  PATIENT:  Brittney Sawyer  47 y.o. female  PRE-OPERATIVE DIAGNOSIS:  OA RIGHT KNEE  POST-OPERATIVE DIAGNOSIS:  OA RIGHT KNEE  PROCEDURE:  Procedure(s): TOTAL KNEE ARTHROPLASTY (Right)  SURGEON:  Surgeon(s) and Role:    * W D Carloyn Manner., MD - Primary  PHYSICIAN ASSISTANT: Margart Sickles, PA-C  ASSISTANTS:   ANESTHESIA:   regional and general  EBL:  Total I/O In: 1500 [I.V.:1500] Out: 175 [Urine:100; Blood:75]  BLOOD ADMINISTERED:none  DRAINS: 1 hemovac drain left knee to self suction  LOCAL MEDICATIONS USED:  NONE  SPECIMEN:  No Specimen  DISPOSITION OF SPECIMEN:  N/A  COUNTS:  YES  TOURNIQUET:   Total Tourniquet Time Documented: Thigh (Right) - 53 minutes Total: Thigh (Right) - 53 minutes   DICTATION: .Other Dictation: Dictation Number unknown  PLAN OF CARE: Admit to inpatient   PATIENT DISPOSITION:  PACU - hemodynamically stable.   Delay start of Pharmacological VTE agent (>24hrs) due to surgical blood loss or risk of bleeding: yes

## 2013-01-26 NOTE — Progress Notes (Signed)
Report given to Philip RN

## 2013-01-27 ENCOUNTER — Encounter (HOSPITAL_COMMUNITY): Payer: Self-pay | Admitting: Orthopedic Surgery

## 2013-01-27 DIAGNOSIS — B3731 Acute candidiasis of vulva and vagina: Secondary | ICD-10-CM | POA: Diagnosis present

## 2013-01-27 DIAGNOSIS — M1711 Unilateral primary osteoarthritis, right knee: Secondary | ICD-10-CM

## 2013-01-27 DIAGNOSIS — B373 Candidiasis of vulva and vagina: Secondary | ICD-10-CM | POA: Diagnosis present

## 2013-01-27 HISTORY — DX: Unilateral primary osteoarthritis, right knee: M17.11

## 2013-01-27 LAB — BASIC METABOLIC PANEL
GFR calc Af Amer: >90 mL/min (ref >90)
GFR calc non Af Amer: >90 mL/min (ref >90)
Potassium: 3.8 mEq/L (ref 3.5–5.1)
Sodium: 135 mEq/L (ref 135–145)

## 2013-01-27 LAB — CBC
Hemoglobin: 11.3 g/dL — ABNORMAL LOW (ref 12.0–15.0)
MCHC: 34.7 g/dL (ref 30.0–36.0)
RDW: 12.3 % (ref 11.5–15.5)

## 2013-01-27 MED ORDER — FLUCONAZOLE 150 MG PO TABS
150.0000 mg | ORAL_TABLET | Freq: Once | ORAL | Status: AC
  Administered 2013-01-27: 150 mg via ORAL
  Filled 2013-01-27: qty 1

## 2013-01-27 MED ORDER — SODIUM CHLORIDE 0.9 % IV BOLUS (SEPSIS)
500.0000 mL | Freq: Once | INTRAVENOUS | Status: AC
  Administered 2013-01-27: 500 mL via INTRAVENOUS

## 2013-01-27 MED ORDER — OXYCODONE HCL 5 MG PO TABS
15.0000 mg | ORAL_TABLET | ORAL | Status: DC | PRN
  Administered 2013-01-27 – 2013-01-29 (×17): 15 mg via ORAL
  Filled 2013-01-27 (×16): qty 3

## 2013-01-27 MED ORDER — DIPHENHYDRAMINE HCL 25 MG PO CAPS
25.0000 mg | ORAL_CAPSULE | Freq: Four times a day (QID) | ORAL | Status: DC | PRN
  Administered 2013-01-27: 25 mg via ORAL
  Administered 2013-01-28: 50 mg via ORAL
  Filled 2013-01-27: qty 1
  Filled 2013-01-27: qty 2

## 2013-01-27 MED ORDER — OXYCODONE HCL 5 MG PO TABS
20.0000 mg | ORAL_TABLET | Freq: Once | ORAL | Status: AC
  Administered 2013-01-27: 20 mg via ORAL
  Filled 2013-01-27: qty 4

## 2013-01-27 NOTE — Op Note (Signed)
NAME:  SONI, KEGEL NO.:  [id number]  MEDICAL RECORD NO.:  [id number]  LOCATION:  [id number]                        FACILITY:  [facility]  PHYSICIAN:  Dyke Brackett, M.D.    DATE OF BIRTH:  15-Apr-1966  DATE OF PROCEDURE:  01/26/2013 DATE OF DISCHARGE:                              OPERATIVE REPORT   INDICATION:  This is a 47 year old with intractable knee pain, bicompartmental arthritis not responding to conservative treatment. Right knee thought to be amenable to hospitalization.  PREOPERATIVE DIAGNOSIS:  Osteoarthritis, right knee.  POSTOPERATIVE DIAGNOSIS:  Osteoarthritis, right knee.  OPERATION:  Right total knee replacement (Sigma cemented size 2.5 femur, size 2 tibia, 12.5 mm bearing with 32 mm all poly patella).  SURGEON:  Dyke Brackett, M.D.  ASSISTANT:  Margart Sickles, PA-C.  DESCRIPTION OF PROCEDURE:  Sterile prep and drape, exsanguination of the leg, inflation to 350.  Straight skin incision with medial parapatellar approach to the knee made.  Identified the most diseased medial compartment, cut the tibia about 3-4 mm below that, 11 mm distal femoral cut in 5 degrees valgus.  Extension gap was measured at 12.5 mm. Attention was next directed to the femur, we sized it to be a 2.5 followed by placement of the 2 pins with the appropriate external rotation with a 12.5 mm shim.  We then placed the cutting block for the anterior-posterior as well as chamfers and cut the femur.  Flexion gap equal the extension gap with good ligamentous balance at 12.5 mm.  Keel hole was cut for the tibia, size 2 tibia was deemed to be appropriate.  We then placed a trial tibia followed by box cut on the femur.  Trial femur was placed with a trial tibia.  With a 12.5 mm bearing, we cut leaving about 14 mm of native patella for an all poly patella trial.  All trials were placed.  Range of motion 0 to 120, excellent stability noted, tendency for bearing spin out.  Trials were  removed.  Bony surfaces were irrigated.  We elected to use antibiotic impregnated cement with gentamicin to the PEG.  The patient despite having no obvious source of infection tended to run a high white count, placed 1 batch of cement into each batch of glue, placed the tibia, followed by the femur patella.  We did elect to use a trial bearing.  Cement was allowed to harden.  Trial bearing was removed. Excess cement was removed from the posterior aspect of the knee. Tourniquet was released.  No excessive bleeding was noted.  Then the final bearing 12.5 mm to match the femur 2.5 was placed.  Again all parameters deemed to be acceptable.  Hemovac drain was placed exiting superolaterally.  Closure was affected with #1 Ethibond, 2-0 Vicryl, skin clips.  Lightly compressive sterile dressing was applied.  Knee immobilizer.  Taken to the recovery room in a stable condition.     Dyke Brackett, M.D.     WDC/MEDQ  D:  01/26/2013  T:  01/27/2013  Job:  [id number]

## 2013-01-27 NOTE — Care Management Note (Signed)
    Page 1 of 2   01/27/2013     5:10:38 PM   CARE MANAGEMENT NOTE 01/27/2013  Patient:  Brittney Sawyer, Brittney Sawyer   Account Number:  [id number]  Date Initiated:  01/26/2013  Documentation initiated by:  [facility]  Subjective/Objective Assessment:   admitted postop right total knee arthroplasty     Action/Plan:   plan HHPT   Anticipated DC Date:  01/28/2013   Anticipated DC Plan:  HOME W HOME HEALTH SERVICES      DC Planning Services  CM consult      [facility] Choice  HOME HEALTH   Choice offered to / List presented to:     DME arranged  3-N-1  WALKER - ROLLING        HH arranged  HH-2 PT      [facility] agency  [facility]   Status of service:  Completed, signed off Medicare Important Message given?   (If response is "NO", the following Medicare IM given date fields will be blank) Date Medicare IM given:   Date Additional Medicare IM given:    Discharge Disposition:  HOME W HOME HEALTH SERVICES  Per UR Regulation:    If discussed at Long Length of Stay Meetings, dates discussed:    Comments:  01/28/1416:00 CM notified [facility] of pt discharge.  3n1 and Rolling Walker in room prior to discharge.  No other CM needs were communicated.  Freddy Jaksch, BSN, [place] [phone].   01/26/13 Patient set up with HHPT with Gordy Clement by MD office. Jacquelynn Cree RN, BSN, CCM

## 2013-01-27 NOTE — Evaluation (Signed)
Occupational Therapy Evaluation Patient Details Name: Brittney Sawyer MRN: [id number] DOB: 1966-02-04 Today's Date: 01/27/2013 Time: 1478-2956 OT Time Calculation (min): 29 min  OT Assessment / Plan / Recommendation History of present illness Patient is a 47 yo female s/p Rt TKA.   Clinical Impression   Pt admitted with above. Will continue to follow pt acutely in order to address below problem list in prep for return home with family.    OT Assessment       Follow Up Recommendations  No OT follow up;Supervision/Assistance - 24 hour    Barriers to Discharge      Equipment Recommendations  Tub/shower seat;3 in 1 bedside comode    Recommendations for Other Services    Frequency       Precautions / Restrictions Precautions Precautions: Knee Precaution Booklet Issued: Yes (comment) Precaution Comments: Reviewed precautions with patient. Required Braces or Orthoses: Knee Immobilizer - Right Knee Immobilizer - Right: On when out of bed or walking Restrictions Weight Bearing Restrictions: Yes RLE Weight Bearing: Weight bearing as tolerated   Pertinent Vitals/Pain See vitals    ADL  Eating/Feeding: Performed;Independent Where Assessed - Eating/Feeding: Edge of bed Grooming: Performed;Wash/dry hands;Min guard Where Assessed - Grooming: Unsupported standing Upper Body Bathing: Simulated;Set up Where Assessed - Upper Body Bathing: Unsupported sitting Lower Body Bathing: Simulated;Moderate assistance Where Assessed - Lower Body Bathing: Supported sit to stand Upper Body Dressing: Simulated;Set up Where Assessed - Upper Body Dressing: Unsupported sitting Lower Body Dressing: Performed;Moderate assistance Where Assessed - Lower Body Dressing: Supported sit to Pharmacist, hospital: Performed;Minimal Dentist Method: Sit to Barista: Comfort height toilet;Grab bars Toileting - Clothing Manipulation and Hygiene: Performed;Minimal  assistance Where Assessed - Engineer, mining and Hygiene: Sit to stand from 3-in-1 or toilet Equipment Used: Gait belt;Knee Immobilizer;Rolling walker Transfers/Ambulation Related to ADLs: Min guard with RW ambulating in room bed<>bathroom. ADL Comments: Pt limited by R LE pain.    OT Diagnosis:    OT Problem List:   OT Treatment Interventions:     OT Goals(Current goals can be found in the care plan section) Acute Rehab OT Goals Patient Stated Goal: To be able to go home safely OT Goal Formulation: With patient Time For Goal Achievement: 02/03/13 Potential to Achieve Goals: Good  Visit Information  Last OT Received On: 01/27/13 Assistance Needed: +1 History of Present Illness: Patient is a 47 yo female s/p Rt TKA.       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         Vision/Perception     Copywriter, advertising Arousal/Alertness: Awake/alert Behavior During Therapy: WFL for tasks assessed/performed Overall Cognitive Status: Within Functional Limits for tasks assessed    Extremity/Trunk Assessment Upper Extremity Assessment Upper Extremity Assessment: Overall WFL for tasks assessed     Mobility Bed Mobility Bed Mobility: Supine to Sit;Sitting - Scoot to Edge of Bed;Sit to Supine Supine to Sit: 4: Min assist;With rails Sitting - Scoot to Edge of Bed: 4: Min guard Sit to Supine: 4:  Min assist;HOB flat Details  for Bed Mobility Assistance: Assist to support RLE  Transfers Transfers: Sit to Stand;Stand to Sit Sit to Stand: 4: Min assist;From toilet;From bed;With upper extremity assist Stand to Sit: 4: Min assist;To toilet;To bed;With upper extremity assist Details for Transfer Assistance: VCs for hand placement and sequencing.     Exercise     Balance     End of Session    GO    01/27/2013 Cipriano Mile OTR/L Pager [phone] Office [phone]  Cipriano Mile 01/27/2013, 1:51 PM

## 2013-01-27 NOTE — Progress Notes (Signed)
Physical Therapy Treatment Patient Details Name: Brittney Sawyer MRN: [id number] DOB: February 08, 1966 Today's Date: 01/27/2013 Time: 4098-1191 PT Time Calculation (min): 23 min  PT Assessment / Plan / Recommendation  History of Present Illness Patient is a 47 yo female s/p Rt TKA.   PT Comments   Patient progressing with mobility and gait.  Follow Up Recommendations  Home health PT;Supervision/Assistance - 24 hour     Does the patient have the potential to tolerate intense rehabilitation     Barriers to Discharge        Equipment Recommendations  Rolling walker with 5" wheels;3in1 (PT)    Recommendations for Other Services    Frequency 7X/week   Progress towards PT Goals Progress towards PT goals: Progressing toward goals  Plan Current plan remains appropriate    Precautions / Restrictions Precautions Precautions: Knee Precaution Comments: Reviewed precautions with patient. Required Braces or Orthoses: Knee Immobilizer - Right Knee Immobilizer - Right: On when out of bed or walking (per orders) Restrictions Weight Bearing Restrictions: Yes RLE Weight Bearing: Weight bearing as tolerated   Pertinent Vitals/Pain Pain 7/10 with gait, limiting mobility.    Mobility  Bed Mobility Bed Mobility: Supine to Sit;Sitting - Scoot to Edge of Bed Supine to Sit: 4: Min assist;With rails Sitting - Scoot to [place] of Bed: 4: Min guard Details for Bed Mobility Assistance: Reviewed donning KI on RLE.  Assist to move RLE off of bed. Transfers Transfers: Sit to Stand;Stand to Sit Sit to Stand: 4: Min assist;With upper extremity assist;From bed Stand to Sit: 4: Min assist;With upper extremity assist;With armrests;To chair/3-in-1 Details for Transfer Assistance: Verbal cues for hand placement and technique. Ambulation/Gait Ambulation/Gait Assistance: 4: Min assist Ambulation Distance (Feet): 110 Feet Assistive device: Rolling walker Ambulation/Gait Assistance Details: Verbal cues for safe use  of RW and gait sequence.  Reviewed WBAT status for RLE.   Gait Pattern: Step-to pattern;Decreased stance time - right;Decreased step length - left;Antalgic;Trunk flexed Gait velocity: Very slow guarded gait      PT Goals (current goals can now be found in the care plan section)    Visit Information  Last PT Received On: 01/27/13 Assistance Needed: +1 History of Present Illness: Patient is a 47 yo female s/p Rt TKA.    Subjective Data  Subjective: "I can't believe I'm doing this (walking)"   Cognition  Cognition Arousal/Alertness: Awake/alert Behavior During Therapy: WFL for tasks assessed/performed Overall Cognitive Status: Within Functional Limits for tasks assessed    Balance     End of Session PT - End of Session Equipment Utilized During Treatment: Gait belt;Right knee immobilizer Activity Tolerance: Patient limited by pain;Patient tolerated treatment well Patient left: in chair;with call bell/phone within reach;with family/visitor present Nurse Communication: Mobility status   GP     Vena Austria 01/27/2013, 10:52 AM Durenda Hurt. Renaldo Fiddler, [facility] Acute Rehab Services Pager [phone]

## 2013-01-27 NOTE — Progress Notes (Signed)
[facility] Cross Cover Progress Note  Subjective   Doing ok  C/o moderate R knee pain  Has chronic pain issues Tolerating diet Urine cx negative for UTI, do not need to complete Cipro course Denies lightheadedness  Pt does c/o yeast infection states present pre-admission   Not wearing knee immobilizer on my entrance to room and in side lying position with R knee bent  Has not been in CPM this am, but in yesterday evening  Review of Systems  Constitutional: Negative for fever and chills.  Eyes: Negative for blurred vision.  Respiratory: Negative for shortness of breath and wheezing.   Cardiovascular: Negative for chest pain and palpitations.  Gastrointestinal: Negative for nausea, vomiting and abdominal pain.  Genitourinary: Negative for dysuria, urgency and flank pain.  Neurological: Negative for tingling, sensory change and headaches.    Objective  BP 96/57  Pulse 104  Temp(Src) 98.3 F (36.8 C) (Oral)  Resp 18  SpO2 98%  Intake/Output     09/19 0701 - 09/20 0700 09/20 0701 - 09/21 0700   P.O. 360    I.V. 2910    IV Piggyback 50    Total Intake 3320     Urine 4125    Drains 425    Blood 75    Total Output 4625     Net -1305           Labs  Results for DEANDREA, VANPELT (MRN [id number]) as of 01/27/2013 08:42  Ref. Range 01/27/2013 05:45  Sodium Latest Range: 135-145 mEq/L 135  Potassium Latest Range: 3.5-5.1 mEq/L 3.8  Chloride Latest Range: 96-112 mEq/L 98  CO2 Latest Range: 19-32 mEq/L 24  BUN Latest Range: 6-23 mg/dL 11  Creatinine Latest Range: 0.50-1.10 mg/dL 4.09  Calcium Latest Range: 8.4-10.5 mg/dL 9.0  GFR calc non Af Amer Latest Range: >90 mL/min >90  GFR calc Af Amer Latest Range: >90 mL/min >90  Glucose Latest Range: 70-99 mg/dL 811 (H)  WBC Latest Range: 4.6-10.2 K/uL 16.4 (H)  RBC Latest Range: 4.04-5.48 M/uL 3.40 (L)  Hemoglobin Latest Range: 12.0-15.0 g/dL 91.4 (L)  HCT Latest Range: 36.0-46.0 % 32.6 (L)  MCV Latest  Range: 80-97 fL 95.9  MCH Latest Range: 27-31.2 pg 33.2  MCHC Latest Range: 31.8-35.4 g/dL 78.2  RDW Latest Range: 11.5-15.5 % 12.3  Platelets Latest Range: 150-400 K/uL 261   Urine Cx  4d ago Specimen Description  URINE, CLEAN CATCH   Special Requests  NONE   Culture Setup Time  01/23/2013 18:35 Performed at [employer] Count  2,000 COLONIES/ML Performed at [facility]  Culture  INSIGNIFICANT GROWTH Performed at [facility]    Exam  Gen: awake and alert but sleepy  Lungs: clear B Cardiac: s1 and s2, RRR Abd: soft, NTND, + BS, obese Ext:      Right Lower Extremity  Dressing and drain intact  Ace wrap is somewhat tattered   DPN, SPN, TN sensation intact  EHL, FHL, AT, PT, peroneals, gastroc motor intact  Ext warm  + DP pulse  Compartments soft and NT  No pain with passive stretch    Assessment and Plan  47 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 DJD R Knee s/p R TKA POD 1  Dressing change tomorrow   WBAT  CPM   Total knee precautions!!!  (I have reviewed these with the pt as well)  Ice and elevate  PT/OT consults  2. Vulvovaginal  candidiasis   Diflucan 150 mg po x 1  3. Hypotension w/ tachycardia   Likely related to hypovolemia   H/H good  Will give NS bolus 500 cc x 1  Recheck cbc in am  4. Hypertension  Will hold Lisinopril-hctz due to #3  5. Chronic Pain   Home meds as ordered   Oxy ir and IV dilaudid for breakthrough    6. DVT/PE prophylaxis    Lovenox   TED's  7. FEN  Advance diet as tolerated  Increase fluids to 100 cc/hr  8. Dispo  PT/OT consults today  Continue with CPM  Home equipment  Possible d/c home tomorrow if doing well   Mearl Latin, PA-C [facility] [phone] (P) 01/27/2013 8:52 AM

## 2013-01-27 NOTE — Progress Notes (Signed)
PT Cancellation Note  Patient Details Name: Brittney Sawyer MRN: [id number] DOB: 1965/06/07   Cancelled Treatment:    Reason Eval/Treat Not Completed: Pain limiting ability to participate.  Patient tearful in pain.  Will defer PT session this pm.  Will return in am for PT.   Vena Austria 01/27/2013, 5:15 PM Durenda Hurt. Renaldo Fiddler, [facility] Acute Rehab Services Pager [phone]

## 2013-01-28 LAB — CBC
Hemoglobin: 10.7 g/dL — ABNORMAL LOW (ref 12.0–15.0)
RBC: 3.3 MIL/uL — ABNORMAL LOW (ref 3.87–5.11)

## 2013-01-28 LAB — BASIC METABOLIC PANEL
CO2: 26 mEq/L (ref 19–32)
Glucose, Bld: 144 mg/dL — ABNORMAL HIGH (ref 70–99)
Potassium: 3.5 mEq/L (ref 3.5–5.1)
Sodium: 136 mEq/L (ref 135–145)

## 2013-01-28 MED ORDER — METHOCARBAMOL 500 MG PO TABS
500.0000 mg | ORAL_TABLET | Freq: Four times a day (QID) | ORAL | Status: DC | PRN
  Administered 2013-01-28 – 2013-01-29 (×5): 1000 mg via ORAL
  Filled 2013-01-28 (×6): qty 2

## 2013-01-28 MED ORDER — METHOCARBAMOL 100 MG/ML IJ SOLN
500.0000 mg | Freq: Four times a day (QID) | INTRAVENOUS | Status: DC | PRN
  Filled 2013-01-28: qty 5

## 2013-01-28 NOTE — Progress Notes (Signed)
Occupational Therapy Treatment Patient Details Name: Brittney Sawyer MRN: [id number] DOB: 08-09-1965 Today's Date: 01/28/2013 Time: 1246-1310 OT Time Calculation (min): 24 min  OT Assessment / Plan / Recommendation  History of present illness Patient is a 47 yo female s/p Rt TKA.   OT comments  Pt is slowly progressing toward goals. Very painful today and requiring increased time for all mobility tasks.  Follow Up Recommendations  No OT follow up;Supervision/Assistance - 24 hour    Barriers to Discharge       Equipment Recommendations  Tub/shower seat;3 in 1 bedside comode    Recommendations for Other Services    Frequency Min 2X/week   Progress towards OT Goals Progress towards OT goals: Progressing toward goals  Plan Discharge plan remains appropriate    Precautions / Restrictions Precautions Precautions: Knee Required Braces or Orthoses: Knee Immobilizer - Right Knee Immobilizer - Right: On when out of bed or walking Restrictions Weight Bearing Restrictions: Yes RLE Weight Bearing: Weight bearing as tolerated   Pertinent Vitals/Pain See vitals    ADL  Grooming: Performed;Wash/dry hands;Supervision/safety Where Assessed - Grooming: Unsupported standing Toilet Transfer: Performed;Min guard Statistician Method: Sit to Barista: Comfort height toilet;Grab bars Toileting - Clothing Manipulation and Hygiene: Performed;Minimal assistance Where Assessed - Engineer, mining and Hygiene: Sit to stand from 3-in-1 or toilet Equipment Used: Gait belt;Knee Immobilizer;Rolling walker Transfers/Ambulation Related to ADLs: Min guard with RW ambulating in room bed<>bathroom. ADL Comments: Pt very painful    OT Diagnosis:    OT Problem List:   OT Treatment Interventions:     OT Goals(current goals can now be found in the care plan section) Acute Rehab OT Goals Patient Stated Goal: To be able to go home safely OT Goal Formulation: With  patient Time For Goal Achievement: 02/03/13 Potential to Achieve Goals: Good ADL Goals Pt Will Perform Lower Body Dressing: with min assist;sit to/from stand Pt Will Transfer to Toilet: with supervision;ambulating Pt Will Perform Toileting - Clothing Manipulation and hygiene: with supervision;sit to/from stand Pt Will Perform Tub/Shower Transfer: Tub transfer;with min guard assist;shower seat;rolling walker  Visit Information  Last OT Received On: 01/28/13 Assistance Needed: +1 History of Present Illness: Patient is a 47 yo female s/p Rt TKA.    Subjective Data      Prior Functioning       Cognition  Cognition Arousal/Alertness: Awake/alert Behavior During Therapy: WFL for tasks assessed/performed Overall Cognitive Status: Within Functional Limits for tasks assessed    Mobility  Bed Mobility Bed Mobility: Supine to Sit;Sitting - Scoot to Edge of Bed;Sit to Supine Supine to Sit: 4: Min assist;With rails Sitting - Scoot to Edge of Bed: 4: Min guard Sit to Supine: 4: Min assist;HOB flat Transfers Transfers: Sit to Stand;Stand to Sit Sit to Stand: 4: Min guard;From bed;From toilet;With upper extremity assist Stand to Sit: 4: Min guard;To toilet;To bed;With upper extremity assist    Exercises      Balance     End of Session OT - End of Session Equipment Utilized During Treatment: Gait belt;Rolling walker;Right knee immobilizer Activity Tolerance: Patient limited by pain Patient left: in bed;with call bell/phone within reach  GO    01/28/2013 Cipriano Mile OTR/L Pager [phone] Office [phone]  Cipriano Mile 01/28/2013, 2:58 PM

## 2013-01-28 NOTE — Progress Notes (Signed)
PT Cancellation Note  Patient Details Name: Brittney Sawyer MRN: [id number] DOB: 1965/06/24   Cancelled Treatment:    Reason Eval/Treat Not Completed: Pain limiting ability to participate.  Attempted to see patient this pm to try stairs.  Patient declined, stating pain is too great.  Will return in am.   Vena Austria 01/28/2013, 5:15 PM Brittney Sawyer. Brittney Sawyer, [facility] Acute Rehab Services Pager [phone]

## 2013-01-28 NOTE — Progress Notes (Signed)
Physical Therapy Treatment Patient Details Name: Brittney Sawyer MRN: [id number] DOB: 1966-01-12 Today's Date: 01/28/2013 Time: 1010-1057 PT Time Calculation (min): 47 min  PT Assessment / Plan / Recommendation  History of Present Illness Patient is a 47 yo female s/p Rt TKA.   PT Comments   Patient doing well with mobility and gait.  Attempted stairs today - unable to bear weight on RLE to step up with LLE.  Pain continues to limit mobility.  Will attempt stairs at next session.  Follow Up Recommendations  Home health PT;Supervision/Assistance - 24 hour     Does the patient have the potential to tolerate intense rehabilitation     Barriers to Discharge        Equipment Recommendations  Rolling walker with 5" wheels;3in1 (PT)    Recommendations for Other Services    Frequency 7X/week   Progress towards PT Goals Progress towards PT goals: Progressing toward goals  Plan Current plan remains appropriate    Precautions / Restrictions Precautions Precautions: Knee Required Braces or Orthoses: Knee Immobilizer - Right Knee Immobilizer - Right: On when out of bed or walking Restrictions Weight Bearing Restrictions: Yes RLE Weight Bearing: Weight bearing as tolerated   Pertinent Vitals/Pain Pain limiting mobility.    Mobility  Bed Mobility Bed Mobility: Supine to Sit;Sitting - Scoot to Edge of Bed;Sit to Supine Supine to Sit: 4: Min assist;With rails Sitting - Scoot to [place] of Bed: 4: Min guard Sit to Supine: 4: Min assist;HOB flat Details for Bed Mobility Assistance: Assist to move RLE off of and onto bed.  Reviewed donning KI RLE. Transfers Transfers: Sit to Stand;Stand to Sit Sit to Stand: 4: Min guard;From bed;From toilet;With upper extremity assist Stand to Sit: 4: Min guard;To toilet;To bed;With upper extremity assist Details for Transfer Assistance: Patient using correct hand placement. Ambulation/Gait Ambulation/Gait Assistance: 4: Min guard Ambulation Distance  (Feet): 160 Feet Assistive device: Rolling walker Ambulation/Gait Assistance Details: Patient using correct gait sequence. Gait Pattern: Step-through pattern;Decreased stride length Gait velocity: Very slow guarded gait Stairs: Yes Stairs Assistance: 2: Max Editor, commissioning Details (indicate cue type and reason): Instructed patient to negotiate stairs using 1 rail forward and sideways.  Patient attempted, but unable to step up onto step with LLE. (fearful and unable to bear weight on RLE to step up). Stair Management Technique: One rail Right;Step to pattern;Sideways;Forwards Number of Stairs: 0    Exercises Total Joint Exercises Ankle Circles/Pumps: AROM;Both;10 reps;Supine Quad Sets: AROM;Right;10 reps;Supine Towel Squeeze: AROM;Both;10 reps;Supine Short Arc Quad: AROM;Right;10 reps;Supine Heel Slides: AROM;Right;10 reps;Supine Hip ABduction/ADduction: AROM;Right;10 reps;Supine Long Arc Quad: AROM;Right;Seated;5 reps Knee Flexion: AROM;Right;5 reps;Seated     PT Goals (current goals can now be found in the care plan section) Acute Rehab PT Goals Patient Stated Goal: To be able to go home safely  Visit Information  Last PT Received On: 01/28/13 Assistance Needed: +1 History of Present Illness: Patient is a 47 yo female s/p Rt TKA.    Subjective Data  Subjective: "I'm still having pain today"  (RN provided pain meds) Patient Stated Goal: To be able to go home safely   Cognition  Cognition Arousal/Alertness: Awake/alert Behavior During Therapy: WFL for tasks assessed/performed Overall Cognitive Status: Within Functional Limits for tasks assessed    Balance     End of Session PT - End of Session Equipment Utilized During Treatment: Gait belt;Right knee immobilizer Activity Tolerance: Patient limited by pain Patient left: in chair;with call bell/phone within reach Nurse Communication: Mobility status;Patient  requests pain meds   GP    Vena Austria 01/28/2013, 5:11 PM Durenda Hurt. Renaldo Fiddler, [facility] Acute Rehab Services Pager [phone]

## 2013-01-28 NOTE — Progress Notes (Signed)
[facility] Cross Cover Progress Note  Subjective  Doing ok Did need to increase pain meds yesterday for control Still c/o pain Slow to mobilize Not ready to go home today   Review of Systems  Constitutional: Positive for malaise/fatigue. Negative for fever and chills.       No lightheadedness   Eyes: Negative for blurred vision.  Respiratory: Negative for cough and shortness of breath.   Cardiovascular: Negative for chest pain and palpitations.  Gastrointestinal: Negative for nausea, vomiting and abdominal pain.  Genitourinary: Negative for dysuria.  Neurological: Negative for tingling, sensory change, weakness and headaches.   Objective  BP 106/53  Pulse 102  Temp(Src) 98.5 F (36.9 C) (Oral)  Resp 20  SpO2 99%  Intake/Output     09/20 0701 - 09/21 0700 09/21 0701 - 09/22 0700   P.O. 840 360   I.V.     IV Piggyback     Total Intake 840 360   Urine 300    Drains 50    Blood     Total Output 350     Net +490 +360        Urine Occurrence 3 x 1 x    Labs  Results for KA, BENCH (MRN [id number]) as of 01/28/2013 09:41  Ref. Range 01/28/2013 06:13  Sodium Latest Range: 135-145 mEq/L 136  Potassium Latest Range: 3.5-5.1 mEq/L 3.5  Chloride Latest Range: 96-112 mEq/L 99  CO2 Latest Range: 19-32 mEq/L 26  BUN Latest Range: 6-23 mg/dL 8  Creatinine Latest Range: 0.50-1.10 mg/dL 1.47  Calcium Latest Range: 8.4-10.5 mg/dL 8.7  GFR calc non Af Amer Latest Range: >90 mL/min >90  GFR calc Af Amer Latest Range: >90 mL/min >90  Glucose Latest Range: 70-99 mg/dL 829 (H)  WBC Latest Range: 4.6-10.2 K/uL 13.0 (H)  RBC Latest Range: 4.04-5.48 M/uL 3.30 (L)  Hemoglobin Latest Range: 12.0-15.0 g/dL 56.2 (L)  HCT Latest Range: 36.0-46.0 % 31.6 (L)  MCV Latest Range: 80-97 fL 95.8  MCH Latest Range: 27-31.2 pg 32.4  MCHC Latest Range: 31.8-35.4 g/dL 13.0  RDW Latest Range: 11.5-15.5 % 12.4  Platelets Latest Range: 150-400 K/uL 240   Exam  Gen: awake  and alert sitting in bedside chair   Lungs: clear B Cardiac: s1 and s2, RRR Abd: soft, NTND, + BS, obese Ext:      Right Lower Extremity             incision looks great  No signs of infection  Drain d/c'd             DPN, SPN, TN sensation intact             EHL, FHL, AT, PT, peroneals, gastroc motor intact             Ext warm             + DP pulse             Compartments soft and NT             No pain with passive stretch                Assessment and Plan  47 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 DJD R Knee s/p R TKA POD 2             Dressing changed              WBAT  CPM               Total knee precautions!!!             Ice and elevate             PT/OT consults  2. Vulvovaginal candidiasis               Diflucan 150 mg po x 1, completed   3. Hypotension w/ tachycardia              improved  Continue to monitor  Continue to hold BP meds   4. Hypertension             Will hold Lisinopril-hctz due to #3  5. Acute on chronic pain              continue with current regimen  Did increase robaxin to 500-1000mg  q 6 h prn              6. DVT/PE prophylaxis                         Lovenox               TED's  7. FEN             Advance diet as tolerated             dec fluids to 50 cc/hr  8. Dispo             PT/OT              Continue with CPM             Home equipment             d/c home tomorrow   Mearl Latin, PA-C [facility] [phone] (P) 01/28/2013 9:45 AM

## 2013-01-29 ENCOUNTER — Encounter (HOSPITAL_COMMUNITY): Payer: Self-pay | Admitting: Orthopedic Surgery

## 2013-01-29 LAB — CBC
Hemoglobin: 11.5 g/dL — ABNORMAL LOW (ref 12.0–15.0)
MCH: 32.8 pg (ref 26.0–34.0)
MCV: 94.3 fL (ref 78.0–100.0)
RBC: 3.51 MIL/uL — ABNORMAL LOW (ref 3.87–5.11)

## 2013-01-29 MED ORDER — CIPROFLOXACIN HCL 500 MG PO TABS
500.0000 mg | ORAL_TABLET | Freq: Once | ORAL | Status: AC
  Administered 2013-01-29: 500 mg via ORAL
  Filled 2013-01-29: qty 1

## 2013-01-29 MED ORDER — METHOCARBAMOL 500 MG PO TABS: 500.0000 mg | ORAL_TABLET | Freq: Four times a day (QID) | ORAL | Status: DC | PRN

## 2013-01-29 MED ORDER — ENOXAPARIN (LOVENOX) PATIENT EDUCATION KIT: PACK | Freq: Once | Status: DC

## 2013-01-29 MED ORDER — HYDROCODONE-ACETAMINOPHEN 7.5-325 MG PO TABS: ORAL_TABLET | ORAL | Status: DC

## 2013-01-29 NOTE — Discharge Summary (Signed)
PATIENT ID: Brittney Sawyer        MRN:  [id number]          DOB/AGE: 08/02/1965 / 47 y.o.    DISCHARGE SUMMARY  ADMISSION DATE:    01/26/2013 DISCHARGE DATE:   01/29/2013   ADMISSION DIAGNOSIS: OA RIGHT KNEE    DISCHARGE DIAGNOSIS:  OA RIGHT KNEE    ADDITIONAL DIAGNOSIS: Principal Problem:   Right knee DJD Active Problems:   Mood disorder   Chronic pain   Tobacco user   Obesity (BMI 35.0-39.9 without comorbidity)   Metabolic syndrome   Vaginal candidiasis  Past Medical History  Diagnosis Date  . Depression   . Anxiety   . Hyperlipidemia   . Hypertension   . Substance abuse     Alcohol - sober since 2006  . Neuropathy     post back surgery  . GERD (gastroesophageal reflux disease)     heartburn  . Right knee DJD 01/27/2013    PROCEDURE: Procedure(s): TOTAL KNEE ARTHROPLASTY Right on 01/26/2013  CONSULTS: PT/OT      HISTORY:  See H&P in chart  HOSPITAL COURSE:  Brittney Sawyer is a 47 y.o. admitted on 01/26/2013 and found to have a diagnosis of OA RIGHT KNEE.  After appropriate laboratory studies were obtained  they were taken to the operating room on 01/26/2013 and underwent  Procedure(s): TOTAL KNEE ARTHROPLASTY  Right.   They were given perioperative antibiotics:  Anti-infectives   Start     Dose/Rate Route Frequency Ordered Stop   01/29/13 1030  ciprofloxacin (CIPRO) tablet 500 mg     500 mg Oral  Once 01/29/13 0941 01/29/13 1154   01/27/13 1000  fluconazole (DIFLUCAN) tablet 150 mg     150 mg Oral  Once 01/27/13 0854 01/27/13 1000   01/26/13 1600  ceFAZolin (ANCEF) IVPB 1 g/50 mL premix     1 g 100 mL/hr over 30 Minutes Intravenous Every 6 hours 01/26/13 1451 01/26/13 2328   01/26/13 0600  ceFAZolin (ANCEF) IVPB 2 g/50 mL premix     2 g 100 mL/hr over 30 Minutes Intravenous On call to O.R. 01/25/13 1409 01/26/13 1020    .  Tolerated the procedure well.  Placed with a foley intraoperatively.   POD #1, allowed out of bed to a chair.  PT for ambulation  and exercise program.  Foley D/C'd in morning.  IV saline locked.  O2 discontionued.  POD #2, continued PT and ambulation.   Hemovac pulled. .  The remainder of the hospital course was dedicated to ambulation and strengthening.   The patient was discharged on 3 Days Post-Op in  Stable condition.  Blood products given:none  DIAGNOSTIC STUDIES: Recent vital signs: Patient Vitals for the past 24 hrs:  BP Temp Temp src Pulse Resp SpO2  01/29/13 0638 120/68 mmHg 98.6 F (37 C) Oral 99 18 99 %  01/28/13 2200 113/94 mmHg 98.9 F (37.2 C) Oral 116 20 99 %  01/28/13 1332 123/68 mmHg 98.1 F (36.7 C) - 108 20 97 %       Recent laboratory studies:  Recent Labs  01/23/13 1058 01/26/13 0912 01/26/13 1543 01/27/13 0545 01/28/13 0613 01/29/13 0559  WBC 19.1* 12.5* 25.5* 16.4* 13.0* 18.2*  HGB 14.8 13.6 12.4 11.3* 10.7* 11.5*  HCT 41.4 39.0 35.2* 32.6* 31.6* 33.1*  PLT 359 283 298 261 240 293    Recent Labs  01/23/13 1058 01/26/13 1543 01/27/13 0545 01/28/13 0613  NA 135  --  135 136  K 4.8  --  3.8 3.5  CL 95*  --  98 99  CO2 27  --  24 26  BUN 18  --  11 8  CREATININE 0.65 0.59 0.57 0.52  GLUCOSE 103*  --  137* 144*  CALCIUM 11.2*  --  9.0 8.7   Lab Results  Component Value Date   INR 0.98 01/26/2013   INR 0.93 01/04/2013   INR 1.0 12/09/2007     Recent Radiographic Studies :  Dg Chest 2 View  01/04/2013   *RADIOLOGY REPORT*  Clinical Data: Preoperative evaluation for knee arthroplasty  CHEST - 2 VIEW  Comparison: None.  Findings: The heart and pulmonary vascularity are within normal limits.  The lungs are clear bilaterally.  Mild degenerative change of the thoracic spine is seen.  IMPRESSION: No acute abnormality noted.   Original Report Authenticated By: Alcide Clever, M.D.    DISCHARGE INSTRUCTIONS:   DISCHARGE MEDICATIONS:     Medication List    STOP taking these medications       HYDROcodone-acetaminophen 5-325 MG per tablet  Commonly known as:  NORCO/VICODIN   Replaced by:  HYDROcodone-acetaminophen 7.5-325 MG per tablet     ibuprofen 800 MG tablet  Commonly known as:  ADVIL,MOTRIN      TAKE these medications       ACIDOPHILUS PO  Take 1 tablet by mouth daily.     DULoxetine 60 MG capsule  Commonly known as:  CYMBALTA  Take 1 capsule (60 mg total) by mouth 2 (two) times daily.     enoxaparin 30 MG/0.3ML injection  Commonly known as:  LOVENOX  Inject 0.3 mLs (30 mg total) into the skin every 12 (twelve) hours.     ESTROVEN PO  Take 1 tablet by mouth daily.     FISH OIL PO  Take 1 capsule by mouth 2 (two) times daily.     gabapentin 800 MG tablet  Commonly known as:  NEURONTIN  Take 1 tablet (800 mg total) by mouth 4 (four) times daily.     HYDROcodone-acetaminophen 7.5-325 MG per tablet  Commonly known as:  NORCO  1-2 tabs po q4-6hrs prn pain     lisinopril-hydrochlorothiazide 20-25 MG per tablet  Commonly known as:  PRINZIDE,ZESTORETIC  Take 1 tablet by mouth daily.     methocarbamol 500 MG tablet  Commonly known as:  ROBAXIN  Take 1 tablet (500 mg total) by mouth every 6 (six) hours as needed.     mometasone 50 MCG/ACT nasal spray  Commonly known as:  NASONEX  Place 1 spray into the nose daily.     omeprazole 20 MG capsule  Commonly known as:  PRILOSEC  Take 20 mg by mouth daily.     oxyCODONE 5 MG immediate release tablet  Commonly known as:  ROXICODONE  Take 1-2 tabs po q4-6hrs prn pain     trazodone 300 MG tablet  Commonly known as:  DESYREL  Take 300 mg by mouth at bedtime.        FOLLOW UP VISIT:       Follow-up Information   Follow up with CAFFREY JR,W D, MD. Schedule an appointment as soon as possible for a visit in 2 weeks.   Specialty:  Orthopedic Surgery   Contact information:   [address] [place] [address] [phone]       DISPOSITION:   Home  CONDITION:  Stable   Margart Sickles 01/29/2013, 1:02 PM

## 2013-01-29 NOTE — Anesthesia Postprocedure Evaluation (Signed)
  Anesthesia Post-op Note  Patient: Brittney Sawyer  Procedure(s) Performed: Procedure(s): TOTAL KNEE ARTHROPLASTY (Right)  Patient Location: Nursing Unit  Anesthesia Type:General  Level of Consciousness: awake, alert  and oriented  Airway and Oxygen Therapy: Patient Spontanous Breathing  Post-op Pain: none  Post-op Assessment: Post-op Vital signs reviewed  Post-op Vital Signs: stable  Complications: No apparent anesthesia complications

## 2013-01-29 NOTE — Progress Notes (Signed)
Physical Therapy Treatment Patient Details Name: Brittney Sawyer MRN: [id number] DOB: [birth year] Today's Date: 01/29/2013 Time: 7829-5621 PT Time Calculation (min): 39 min  PT Assessment / Plan / Recommendation  History of Present Illness Patient is a 47 yo female s/p Rt TKA.   PT Comments   Patient progressing well. Still painful but able to complete stair training and eager to DC home today.   Follow Up Recommendations  Home health PT;Supervision/Assistance - 24 hour     Does the patient have the potential to tolerate intense rehabilitation     Barriers to Discharge        Equipment Recommendations  Rolling walker with 5" wheels;3in1 (PT)    Recommendations for Other Services    Frequency 7X/week   Progress towards PT Goals Progress towards PT goals: Progressing toward goals  Plan Current plan remains appropriate    Precautions / Restrictions Precautions Required Braces or Orthoses: Knee Immobilizer - Right Knee Immobilizer - Right: On when out of bed or walking Restrictions RLE Weight Bearing: Weight bearing as tolerated   Pertinent Vitals/Pain 8/10 R knee. RN provided medication to assist with pain control     Mobility  Bed Mobility Bed Mobility: Not assessed Transfers Sit to Stand: From bed;From toilet;With upper extremity assist;From chair/3-in-1;5: Supervision Stand to Sit: To chair/3-in-1;With upper extremity assist;5: Supervision Ambulation/Gait Ambulation/Gait Assistance: 5: Supervision Ambulation Distance (Feet): 200 Feet Assistive device: Rolling walker Gait Pattern: Step-through pattern;Decreased stride length Gait velocity: decreased due to pain Stairs Assistance: 4: Min assist Stairs Assistance Details (indicate cue type and reason): CUes for technique and sequence Stair Management Technique: One rail Right;Step to pattern;Sideways;Forwards Number of Stairs: 2    Exercises Total Joint Exercises Towel Squeeze: AROM;Both;10 reps;Supine Short Arc  Quad: AROM;Right;10 reps;Supine Heel Slides: AROM;Right;10 reps;Supine Hip ABduction/ADduction: AROM;Right;10 reps;Supine Straight Leg Raises: AAROM;Right;10 reps   PT Diagnosis:    PT Problem List:   PT Treatment Interventions:     PT Goals (current goals can now be found in the care plan section)    Visit Information  Last PT Received On: 01/29/13 Assistance Needed: +1 History of Present Illness: Patient is a 47 yo female s/p Rt TKA.    Subjective Data      Cognition  Cognition Arousal/Alertness: Awake/alert Behavior During Therapy: WFL for tasks assessed/performed Overall Cognitive Status: Within Functional Limits for tasks assessed    Balance     End of Session PT - End of Session Equipment Utilized During Treatment: Gait belt;Right knee immobilizer Activity Tolerance: Patient tolerated treatment well Patient left: in chair;with call bell/phone within reach   GP     Fredrich Birks 01/29/2013, 9:06 AM  01/29/2013 Fredrich Birks PTA [phone] pager [phone] office

## 2013-01-29 NOTE — Progress Notes (Signed)
Patient d/c to home, IV was removed earlier in stay.  Prescriptions given and instructions reviewed with patient and family.

## 2013-01-29 NOTE — Progress Notes (Signed)
Subjective: 3 Days Post-Op Procedure(s) (LRB): TOTAL KNEE ARTHROPLASTY (Right) Patient reports pain as moderate.    Objective: Vital signs in last 24 hours: Temp:  [98.1 F (36.7 C)-98.9 F (37.2 C)] 98.6 F (37 C) (09/22 1610) Pulse Rate:  [99-116] 99 (09/22 0638) Resp:  [18-20] 18 (09/22 9604) BP: (113-123)/(68-94) 120/68 mmHg (09/22 5409) SpO2:  [97 %-99 %] 99 % (09/22 8119)  Intake/Output from previous day: 09/21 0701 - 09/22 0700 In: 960 [P.O.:960] Out: -  Intake/Output this shift:     Recent Labs  01/26/13 1543 01/27/13 0545 01/28/13 0613 01/29/13 0559  HGB 12.4 11.3* 10.7* 11.5*    Recent Labs  01/28/13 0613 01/29/13 0559  WBC 13.0* 18.2*  RBC 3.30* 3.51*  HCT 31.6* 33.1*  PLT 240 293    Recent Labs  01/27/13 0545 01/28/13 0613  NA 135 136  K 3.8 3.5  CL 98 99  CO2 24 26  BUN 11 8  CREATININE 0.57 0.52  GLUCOSE 137* 144*  CALCIUM 9.0 8.7   No results found for this basename: LABPT, INR,  in the last 72 hours  Neurovascular intact Sensation intact distally Intact pulses distally Dorsiflexion/Plantar flexion intact Incision: dressing C/D/I and no drainage No cellulitis present Compartment soft  Assessment/Plan: 3 Days Post-Op Procedure(s) (LRB): TOTAL KNEE ARTHROPLASTY (Right) Discharge home with home health  Margart Sickles 01/29/2013, 12:52 PM

## 2013-01-29 NOTE — Progress Notes (Signed)
Spoke with [employer], PA this am, will be in to see patient for d/c today.  Made PA aware of elevated WBC, and that patient had stopped her Cipro with her hospital admit 9/19, new order for one time dose of Cipro 500mg  po.  Patient without any urinary symptoms currently.  PA will start patient back on Cipro upon discharge.

## 2013-01-29 NOTE — Progress Notes (Signed)
Occupational Therapy Treatment Patient Details Name: Brittney Sawyer MRN: [id number] DOB: Aug 15, 1965 Today's Date: 01/29/2013 Time: 2952-8413 OT Time Calculation (min): 25 min  OT Assessment / Plan / Recommendation  History of present illness Patient is a 47 yo female s/p Rt TKA.   OT comments  Pt progressing. OT provided education to pt and family and demonstrated use of AE and also tub transfer. Educated on safety with ADLs- see OT comment section below.   Follow Up Recommendations  No OT follow up;Supervision/Assistance - 24 hour    Barriers to Discharge       Equipment Recommendations  3 in 1 bedside comode    Recommendations for Other Services    Frequency Min 2X/week   Progress towards OT Goals Progress towards OT goals: Progressing toward goals  Plan Discharge plan remains appropriate    Precautions / Restrictions Precautions Precautions: Knee Precaution Comments: Reviewed precautions  Required Braces or Orthoses: Knee Immobilizer - Right Knee Immobilizer - Right: On when out of bed or walking Restrictions Weight Bearing Restrictions: Yes RLE Weight Bearing: Weight bearing as tolerated   Pertinent Vitals/Pain Pain 9/10. Nurse in room.     ADL  Lower Body Dressing: Other (comment) (OT donned right sock) Where Assessed - Lower Body Dressing: Unsupported sitting;Other (comment) (sitting EOB-UE's may have been supported- back unsupported) Toilet Transfer: Radiographer, therapeutic Method: Sit to Barista: Comfort height toilet Equipment Used: Gait belt;Knee Immobilizer;Rolling walker;Reacher;Long-handled sponge;Long-handled shoe horn;Sock aid Transfers/Ambulation Related to ADLs: Min guard for ambulation. Supervision/Min guard for transfers. ADL Comments: Pt getting off commode when OT arrived. Pt wanted to review tub transfer technique. OT ambulated short distance in hallway and then was pushed down to gym. OT demonstrated two techniques  for tub transfer (3 in 1 and shower chair). Pt did not want to practice-family was present in gym during demonstration. Educated on safe shoes, safety with ADLs (standing in front of chair/bed with walker in front, have walker in front when performing hygiene), benefit of bag on walker to carry items, and having rugs picked up. OT also demonstrated transfer from 3 in 1. OT educated on AE for LB ADLs and educated on dressing technique. OT donned right sock as pt verbalized she could not do this. Explained at end of session benefit of reaching to try to don/doff sock as it increases ROM in knee.    OT Diagnosis:    OT Problem List:   OT Treatment Interventions:     OT Goals(current goals can now be found in the care plan section) Acute Rehab OT Goals Patient Stated Goal: not stated OT Goal Formulation: With patient Time For Goal Achievement: 02/03/13 Potential to Achieve Goals: Good ADL Goals Pt Will Perform Lower Body Dressing: with min assist;sit to/from stand Pt Will Transfer to Toilet: with supervision;ambulating Pt Will Perform Toileting - Clothing Manipulation and hygiene: with supervision;sit to/from stand Pt Will Perform Tub/Shower Transfer: Tub transfer;with min guard assist;shower seat;rolling walker  Visit Information  Last OT Received On: 01/29/13 Assistance Needed: +1 History of Present Illness: Patient is a 46 yo female s/p Rt TKA.    Subjective Data      Prior Functioning       Cognition  Cognition Arousal/Alertness: Awake/alert Behavior During Therapy: WFL for tasks assessed/performed Overall Cognitive Status: Within Functional Limits for tasks assessed    Mobility  Bed Mobility Bed Mobility: Not assessed Transfers Transfers: Sit to Stand;Stand to Sit Sit to Stand: 5: Supervision;With upper extremity assist;From toilet;4:  Min guard;From bed Stand to Sit: 4: Min guard;With upper extremity assist;To bed;To chair/3-in-1 Details for Transfer Assistance: Supervision  from toilet, otherwise Min guard.    Exercises      Balance     End of Session OT - End of Session Equipment Utilized During Treatment: Gait belt;Rolling walker;Right knee immobilizer Activity Tolerance: Patient limited by pain Patient left: in chair;with family/visitor present CPM Right Knee CPM Right Knee: Off  GO     Earlie Raveling OTR/L [phone] 01/29/2013, 2:06 PM

## 2013-02-08 ENCOUNTER — Other Ambulatory Visit: Payer: Self-pay | Admitting: Physician Assistant

## 2013-02-08 NOTE — Telephone Encounter (Signed)
Pt wanted to let us to know that she is currently out of Gabapentin. Best# [phone]

## 2013-05-05 ENCOUNTER — Other Ambulatory Visit: Payer: Self-pay | Admitting: Family Medicine

## 2013-05-07 NOTE — Telephone Encounter (Signed)
Dr Cleta Alberts, you most recently saw this pt for knee pain in Sept prior to surgery. Do you want to RF this?

## 2013-05-07 NOTE — Telephone Encounter (Signed)
Patient is calling to check on her med refill request via surescripts.   She is out    [phone]

## 2013-05-25 ENCOUNTER — Other Ambulatory Visit: Payer: Self-pay | Admitting: Family Medicine

## 2013-06-03 ENCOUNTER — Other Ambulatory Visit: Payer: Self-pay | Admitting: Emergency Medicine

## 2013-06-23 ENCOUNTER — Other Ambulatory Visit: Payer: Self-pay | Admitting: Family Medicine

## 2013-06-25 ENCOUNTER — Other Ambulatory Visit: Payer: Self-pay | Admitting: Physician Assistant

## 2013-07-01 ENCOUNTER — Ambulatory Visit (INDEPENDENT_AMBULATORY_CARE_PROVIDER_SITE_OTHER): Payer: BC Managed Care – PPO | Admitting: Internal Medicine

## 2013-07-01 ENCOUNTER — Other Ambulatory Visit: Payer: Self-pay | Admitting: Internal Medicine

## 2013-07-01 VITALS — BP 100/66 | HR 100 | Temp 97.9°F | Resp 16 | Ht 61.25 in | Wt 189.6 lb

## 2013-07-01 DIAGNOSIS — Z720 Tobacco use: Secondary | ICD-10-CM

## 2013-07-01 DIAGNOSIS — F39 Unspecified mood [affective] disorder: Secondary | ICD-10-CM

## 2013-07-01 DIAGNOSIS — Z209 Contact with and (suspected) exposure to unspecified communicable disease: Secondary | ICD-10-CM

## 2013-07-01 DIAGNOSIS — E669 Obesity, unspecified: Secondary | ICD-10-CM

## 2013-07-01 DIAGNOSIS — I10 Essential (primary) hypertension: Secondary | ICD-10-CM

## 2013-07-01 DIAGNOSIS — E8881 Metabolic syndrome: Secondary | ICD-10-CM

## 2013-07-01 MED ORDER — DULOXETINE HCL 60 MG PO CPEP: 60.0000 mg | ORAL_CAPSULE | Freq: Two times a day (BID) | ORAL | Status: DC

## 2013-07-01 MED ORDER — LISINOPRIL-HYDROCHLOROTHIAZIDE 20-25 MG PO TABS: 1.0000 | ORAL_TABLET | Freq: Every day | ORAL | Status: DC

## 2013-07-01 MED ORDER — GABAPENTIN 800 MG PO TABS: 800.0000 mg | ORAL_TABLET | Freq: Four times a day (QID) | ORAL | Status: DC

## 2013-07-01 MED ORDER — TRAMADOL HCL 50 MG PO TABS: 50.0000 mg | ORAL_TABLET | Freq: Every evening | ORAL | Status: DC | PRN

## 2013-07-01 MED ORDER — TRAZODONE HCL 100 MG PO TABS: 300.0000 mg | ORAL_TABLET | Freq: Every day | ORAL | Status: DC

## 2013-07-02 LAB — COMPREHENSIVE METABOLIC PANEL
ALK PHOS: 73 U/L (ref 39–117)
ALT: 17 U/L (ref 0–35)
AST: 19 U/L (ref 0–37)
Albumin: 4.7 g/dL (ref 3.5–5.2)
BUN: 19 mg/dL (ref 6–23)
CO2: 24 mEq/L (ref 19–32)
CREATININE: 0.72 mg/dL (ref 0.50–1.10)
Calcium: 10.2 mg/dL (ref 8.4–10.5)
Chloride: 99 mEq/L (ref 96–112)
Glucose, Bld: 101 mg/dL — ABNORMAL HIGH (ref 70–99)
Potassium: 4.7 mEq/L (ref 3.5–5.3)
Sodium: 137 mEq/L (ref 135–145)
Total Bilirubin: 0.4 mg/dL (ref 0.2–1.2)
Total Protein: 7.2 g/dL (ref 6.0–8.3)

## 2013-07-02 LAB — CBC WITH DIFFERENTIAL/PLATELET
BASOS PCT: 1 % (ref 0–1)
Basophils Absolute: 0.1 10*3/uL (ref 0.0–0.1)
EOS ABS: 0.2 10*3/uL (ref 0.0–0.7)
Eosinophils Relative: 2 % (ref 0–5)
HCT: 40.6 % (ref 36.0–46.0)
HEMOGLOBIN: 14.3 g/dL (ref 12.0–15.0)
Lymphocytes Relative: 28 % (ref 12–46)
Lymphs Abs: 3.2 10*3/uL (ref 0.7–4.0)
MCH: 32.9 pg (ref 26.0–34.0)
MCHC: 35.2 g/dL (ref 30.0–36.0)
MCV: 93.5 fL (ref 78.0–100.0)
MONOS PCT: 6 % (ref 3–12)
Monocytes Absolute: 0.7 10*3/uL (ref 0.1–1.0)
Neutro Abs: 7.2 10*3/uL (ref 1.7–7.7)
Neutrophils Relative %: 63 % (ref 43–77)
PLATELETS: 381 10*3/uL (ref 150–400)
RBC: 4.34 MIL/uL (ref 3.87–5.11)
RDW: 12.7 % (ref 11.5–15.5)
WBC: 11.4 10*3/uL — ABNORMAL HIGH (ref 4.0–10.5)

## 2013-07-02 LAB — LIPID PANEL
CHOL/HDL RATIO: 5.3 ratio
Cholesterol: 211 mg/dL — ABNORMAL HIGH (ref 0–200)
HDL: 40 mg/dL (ref >39)
LDL Cholesterol: 95 mg/dL (ref 0–99)
Triglycerides: 379 mg/dL — ABNORMAL HIGH (ref <150)
VLDL: 76 mg/dL — AB (ref 0–40)

## 2013-07-02 NOTE — Progress Notes (Addendum)
Chief Complaint  Patient presents with  . medication refills    cynbalta, neurotin, lisinopril-HCTZ, trazadone  . rt.knee swelling    TKA 5 months ago    HPI here for medication refill Patient Active Problem List   Diagnosis Date Noted  . Right knee DJD 01/27/2013  . Vaginal candidiasis 01/27/2013  . Metabolic syndrome 15/72/6203  . Obesity (BMI 35.0-39.9 without comorbidity) 12/19/2012  . History of substance abuse 01/07/2012  . Menopause 11/25/2011  . Tobacco user 11/25/2011  . DDD (degenerative disc disease), lumbar 11/25/2011  . HTN (hypertension) 10/21/2011  . Mood disorder 10/21/2011  . Chronic pain 10/21/2011    Current outpatient prescriptions: DULoxetine (CYMBALTA) 60 MG capsule, Take 1 capsule (60 mg total) by mouth 2 (two) times daily., Disp: 60 capsule,  Nutritional Supplements (ESTROVEN PO), Take 1 tablet by mouth daily., Disp: , Rfl: ;   omeprazole (PRILOSEC) 20 MG capsule, Take 20 mg by mouth daily., Disp: , Rfl: ;   gabapentin (NEURONTIN) 800 MG tablet, Take 1 tablet (800 mg total) by mouth 4 (four) times daily., Disp: 360 tablet,  Lactobacillus (ACIDOPHILUS PO), Take 1 tablet by mouth daily., Disp: , Rfl: ;   lisinopril-hydrochlorothiazide (PRINZIDE,ZESTORETIC) 20-25 MG per tablet, Take 1 tablet by mouth daily., Disp: 90 tablet, Rfl: 3;   mometasone (NASONEX) 50 MCG/ACT nasal spray, Place 1 spray into the nose daily., Disp: , Rfl: ;   Omega-3 Fatty Acids (FISH OIL PO), Take 1 capsule by mouth 2 (two) times daily., Disp: , Rfl:    traZODone (DESYREL) 100 MG tablet, Take 3 tablets (300 mg total) by mouth at bedtime., Disp: 45 tablet, Rfl: 0  Also very upset because husband last 4 years is here today being considered for possible herpetic outbreak She does not suspect infidelity. She has no symptoms. Also discouraged because 5 months post knee replacement on the right she has started to have swelling and pain after exercise. She has been rather aggressive that she  is losing weight and feeling much better overall and is excited about her ability to exercise again. Walking up stairs is sometimes painful. Walking after sitting is painful. Dr. French Ana did her surgery.  Patient Active Problem List   Diagnosis Date Noted  . Hyposomnia, insomnia or sleeplessness associated with anxiety 02/05/2013  . Generalized anxiety disorder 02/05/2013  . Unspecified hypothyroidism 11/26/2012   Using thyroid replacement and Ambien at bedtime but off all other medications  Review of Systems No headaches or vision changes  no fatigue or night sweats No chest pain or palpitations  No shortness of breath  No GI symptoms no GU symptoms      Objective:   Physical Exam BP 110/70  Pulse 72  Temp(Src) 98.4 F (36.9 C) (Oral)  Resp 16  Ht 5' 2.5" (1.588 m)  Wt 131 lb (59.421 kg)  BMI 23.56 kg/m2  SpO2 98%  LMP 06/16/2013 No thyromegaly Heart regular rhythm Lungs clear   right knee puffy with healed scar but no effusion She has laxity but with end points with medial and lateral stressors drawer negative Tentative palpation medially but without clear pattern     Assessment & Plan:  Hyposomnia, insomnia or sleeplessness associated with anxiety -continue Ambien  Unspecified hypothyroidism - Plan: T3, Free, TSH, T4, Free,  Generalized anxiety disorder--- this has been stable for a good while now off of medication until the issues of exposure  came up   post surgical knee pain --- refer back to Dr. French Ana  Possible HSV exposure -check antibodies   Meds ordered this encounter  Medications  . DULoxetine (CYMBALTA) 60 MG capsule    Sig: Take 1 capsule (60 mg total) by mouth 2 (two) times daily.    Dispense:  60 capsule    Refill:  0  . traZODone (DESYREL) 100 MG tablet    Sig: Take1 1/2 tablets  by mouth at bedtime.    Dispense:  45 tablet    Refill:  0  . gabapentin (NEURONTIN) 800 MG tablet    Sig: Take 1 tablet (800 mg total) by mouth 4 (four) times  daily.    Dispense:  360 tablet    Refill:  3  . lisinopril-hydrochlorothiazide (PRINZIDE,ZESTORETIC) 20-25 MG per tablet    Sig: Take 1 tablet by mouth daily.    Dispense:  90 tablet    Refill:  3  . traMADol (ULTRAM) 50 MG tablet    Sig: Take 1-2 tablets (50-100 mg total) by mouth at bedtime as needed.    Dispense:  60 tablet    Refill:  0    Results for orders placed in visit on 07/01/13  COMPREHENSIVE METABOLIC PANEL      Result Value Ref Range   Sodium 137  135 - 145 mEq/L   Potassium 4.7  3.5 - 5.3 mEq/L   Chloride 99  96 - 112 mEq/L   CO2 24  19 - 32 mEq/L   Glucose, Bld 101 (*) 70 - 99 mg/dL   BUN 19  6 - 23 mg/dL   Creat 0.72  0.50 - 1.10 mg/dL   Total Bilirubin 0.4  0.2 - 1.2 mg/dL   Alkaline Phosphatase 73  39 - 117 U/L   AST 19  0 - 37 U/L   ALT 17  0 - 35 U/L   Total Protein 7.2  6.0 - 8.3 g/dL   Albumin 4.7  3.5 - 5.2 g/dL   Calcium 10.2  8.4 - 10.5 mg/dL  LIPID PANEL      Result Value Ref Range   Cholesterol 211 (*) 0 - 200 mg/dL   Triglycerides 379 (*) <150 mg/dL   HDL 40  >39 mg/dL   Total CHOL/HDL Ratio 5.3     VLDL 76 (*) 0 - 40 mg/dL   LDL Cholesterol 95  0 - 99 mg/dL  HSV(HERPES SIMPLEX VRS) I + II AB-IGG      Result Value Ref Range   HSV 1 Glycoprotein G Ab, IgG 11.72 (*) Discussed with her   HSV 2 Glycoprotein G Ab, IgG 3.97 (*)   CBC WITH DIFFERENTIAL      Result Value Ref Range   WBC 11.4 (*) 4.0 - 10.5 K/uL   RBC 4.34  3.87 - 5.11 MIL/uL   Hemoglobin 14.3  12.0 - 15.0 g/dL   HCT 40.6  36.0 - 46.0 %   MCV 93.5  78.0 - 100.0 fL   MCH 32.9  26.0 - 34.0 pg   MCHC 35.2  30.0 - 36.0 g/dL   RDW 12.7  11.5 - 15.5 %   Platelets 381  150 - 400 K/uL   Neutrophils Relative % 63  43 - 77 %   Neutro Abs 7.2  1.7 - 7.7 K/uL   Lymphocytes Relative 28  12 - 46 %   Lymphs Abs 3.2  0.7 - 4.0 K/uL   Monocytes Relative 6  3 - 12 %   Monocytes Absolute 0.7  0.1 - 1.0 K/uL   Eosinophils  Relative 2  0 - 5 %   Eosinophils Absolute 0.2  0.0 - 0.7 K/uL    Basophils Relative 1  0 - 1 %   Basophils Absolute 0.1  0.0 - 0.1 K/uL   Smear Review Criteria for review not met

## 2013-07-03 LAB — HSV(HERPES SIMPLEX VRS) I + II AB-IGG
HSV 1 GLYCOPROTEIN G AB, IGG: 11.72 IV — AB
HSV 2 Glycoprotein G Ab, IgG: 3.97 IV — ABNORMAL HIGH

## 2013-07-04 ENCOUNTER — Telehealth: Payer: Self-pay | Admitting: Internal Medicine

## 2013-07-04 NOTE — Telephone Encounter (Signed)
informed

## 2013-07-06 ENCOUNTER — Encounter: Payer: Self-pay | Admitting: Internal Medicine

## 2013-07-06 LAB — T4, FREE: Free T4: 1.18 ng/dL (ref 0.80–1.80)

## 2013-07-06 LAB — T3: T3, Total: 137.5 ng/dL (ref 80.0–204.0)

## 2013-07-06 LAB — TSH: TSH: 0.934 u[IU]/mL (ref 0.350–4.500)

## 2013-07-28 ENCOUNTER — Other Ambulatory Visit: Payer: Self-pay | Admitting: Internal Medicine

## 2013-07-30 ENCOUNTER — Telehealth: Payer: Self-pay

## 2013-07-30 MED ORDER — DULOXETINE HCL 60 MG PO CPEP: 60.0000 mg | ORAL_CAPSULE | Freq: Two times a day (BID) | ORAL | Status: DC

## 2013-07-30 MED ORDER — TRAZODONE HCL 100 MG PO TABS: ORAL_TABLET | ORAL | Status: DC

## 2013-07-30 NOTE — Telephone Encounter (Signed)
Per Dr. Merla Riches last OV- refilled medication until follow up in May. Pt advised.

## 2013-07-30 NOTE — Telephone Encounter (Signed)
PT STATES SHE HAD SEEN DR DOOLITTLE AND HE FORGOT TO GIVE HER THE TRAZODONE AND CYMBALTA. PLEASE CALL [phone]    [facility] ON [address]

## 2013-08-07 ENCOUNTER — Ambulatory Visit (INDEPENDENT_AMBULATORY_CARE_PROVIDER_SITE_OTHER): Payer: BC Managed Care – PPO | Admitting: Internal Medicine

## 2013-08-07 ENCOUNTER — Telehealth: Payer: Self-pay

## 2013-08-07 VITALS — BP 108/62 | HR 70 | Temp 97.9°F | Resp 16 | Ht 62.0 in | Wt 190.0 lb

## 2013-08-07 DIAGNOSIS — E860 Dehydration: Secondary | ICD-10-CM

## 2013-08-07 DIAGNOSIS — K5289 Other specified noninfective gastroenteritis and colitis: Secondary | ICD-10-CM

## 2013-08-07 DIAGNOSIS — K529 Noninfective gastroenteritis and colitis, unspecified: Secondary | ICD-10-CM

## 2013-08-07 LAB — COMPREHENSIVE METABOLIC PANEL
ALK PHOS: 72 U/L (ref 39–117)
ALT: 13 U/L (ref 0–35)
AST: 14 U/L (ref 0–37)
Albumin: 4.4 g/dL (ref 3.5–5.2)
BILIRUBIN TOTAL: 0.4 mg/dL (ref 0.2–1.2)
BUN: 13 mg/dL (ref 6–23)
CO2: 23 mEq/L (ref 19–32)
Calcium: 9.5 mg/dL (ref 8.4–10.5)
Chloride: 102 mEq/L (ref 96–112)
Creat: 0.81 mg/dL (ref 0.50–1.10)
Glucose, Bld: 116 mg/dL — ABNORMAL HIGH (ref 70–99)
Potassium: 4.1 mEq/L (ref 3.5–5.3)
SODIUM: 135 meq/L (ref 135–145)
TOTAL PROTEIN: 6.9 g/dL (ref 6.0–8.3)

## 2013-08-07 LAB — POCT CBC
GRANULOCYTE PERCENT: 77.8 % (ref 37–80)
HCT, POC: 44.8 % (ref 37.7–47.9)
Hemoglobin: 14.7 g/dL (ref 12.2–16.2)
LYMPH, POC: 2.1 (ref 0.6–3.4)
MCH, POC: 32.7 pg — AB (ref 27–31.2)
MCHC: 32.8 g/dL (ref 31.8–35.4)
MCV: 99.6 fL — AB (ref 80–97)
MID (cbc): 0.9 (ref 0–0.9)
MPV: 10.4 fL (ref 0–99.8)
POC Granulocyte: 10.7 — AB (ref 2–6.9)
POC LYMPH PERCENT: 15.4 %L (ref 10–50)
POC MID %: 6.8 % (ref 0–12)
Platelet Count, POC: 415 10*3/uL (ref 142–424)
RBC: 4.5 M/uL (ref 4.04–5.48)
RDW, POC: 12.1 %
WBC: 13.8 10*3/uL — AB (ref 4.6–10.2)

## 2013-08-07 NOTE — Progress Notes (Signed)
   Subjective:    Patient ID: Caroleena Dederick, female    DOB: May 07, 1966, 48 y.o.   MRN: [id number]  HPI 48 yr old Caucasian female is here with her spouse with complaints of nausea and diarrhea for the past five days. She states she has tried OTC Imodium with no results. No vomiting, is able to keep fluids down, but is having 2 watery stools per hour and is incontinent at times. Works in day care setting. Review of Systems     Objective:   Physical Exam  Vitals reviewed. Constitutional: She is oriented to person, place, and time. She appears well-nourished. She appears distressed.  HENT:  Head: Normocephalic.  Eyes: No scleral icterus.  Neck: Normal range of motion. Neck supple.  Cardiovascular: Normal rate, regular rhythm and normal heart sounds.   Pulmonary/Chest: Effort normal and breath sounds normal.  Abdominal: Soft. Normal appearance. She exhibits no distension. Bowel sounds are increased. There is no hepatosplenomegaly. There is no tenderness.  Neurological: She is alert and oriented to person, place, and time. She exhibits normal muscle tone. Coordination normal.  Skin: No rash noted.  Psychiatric: She has a normal mood and affect. Her behavior is normal.   BP112/68 lying  108/62 standing  Felt dizzy  2 liters of IV fluids given  Results for orders placed in visit on 08/07/13  POCT CBC      Result Value Ref Range   WBC 13.8 (*) 4.6 - 10.2 K/uL   Lymph, poc 2.1  0.6 - 3.4   POC LYMPH PERCENT 15.4  10 - 50 %L   MID (cbc) 0.9  0 - 0.9   POC MID % 6.8  0 - 12 %M   POC Granulocyte 10.7 (*) 2 - 6.9   Granulocyte percent 77.8  37 - 80 %G   RBC 4.50  4.04 - 5.48 M/uL   Hemoglobin 14.7  12.2 - 16.2 g/dL   HCT, POC 33.2  95.1 - 47.9 %   MCV 99.6 (*) 80 - 97 fL   MCH, POC 32.7 (*) 27 - 31.2 pg   MCHC 32.8  31.8 - 35.4 g/dL   RDW, POC 88.4     Platelet Count, POC 415  142 - 424 K/uL   MPV 10.4  0 - 99.8 fL       Assessment & Plan:  While having diarrhea do not take bp  meds. Zofran and Immodium prn

## 2013-08-07 NOTE — Telephone Encounter (Signed)
Pt has been taking Immodium and Pepto Bismaul for the past few days. She started 4-5 days ago. She has been vomiting since yesterday.  Pt is on her way here as soon as her husband gets home from work.

## 2013-08-07 NOTE — Telephone Encounter (Signed)
PT WOULD LIKE A CALL BACK FROM SOMEONE, STATES SHE IS HAVING DIARRHEA REALLY BAD AND CAN'T CONTROL IT WAS ADVISED TO COME IN BUT STATES SHE CAN'T SINCE SHE DOESN'T HAVE ANY CONTROL. PLEASE CALL [phone]

## 2013-08-11 ENCOUNTER — Encounter: Payer: Self-pay | Admitting: *Deleted

## 2013-08-11 ENCOUNTER — Telehealth: Payer: Self-pay

## 2013-08-11 NOTE — Telephone Encounter (Signed)
Pt notified and is agreeable. 

## 2013-08-11 NOTE — Telephone Encounter (Signed)
RTC

## 2013-08-11 NOTE — Telephone Encounter (Signed)
Pt called and said she still feels terrible. Still having diarrhea and no appetite. Has not gotten any better since last OV. Is there anything we can call in for her or does she need to RTC?

## 2013-08-12 LAB — STOOL CULTURE

## 2013-08-14 ENCOUNTER — Emergency Department (HOSPITAL_COMMUNITY)
Admission: EM | Admit: 2013-08-14 | Discharge: 2013-08-14 | Disposition: A | Discharge status: Home / Self Care | Payer: BC Managed Care – PPO | Attending: Emergency Medicine | Admitting: Emergency Medicine

## 2013-08-14 ENCOUNTER — Encounter (HOSPITAL_COMMUNITY): Payer: Self-pay | Admitting: Emergency Medicine

## 2013-08-14 ENCOUNTER — Emergency Department (HOSPITAL_COMMUNITY): Payer: BC Managed Care – PPO

## 2013-08-14 DIAGNOSIS — M1710 Unilateral primary osteoarthritis, unspecified knee: Secondary | ICD-10-CM | POA: Insufficient documentation

## 2013-08-14 DIAGNOSIS — IMO0002 Reserved for concepts with insufficient information to code with codable children: Secondary | ICD-10-CM | POA: Insufficient documentation

## 2013-08-14 DIAGNOSIS — Z8719 Personal history of other diseases of the digestive system: Secondary | ICD-10-CM | POA: Insufficient documentation

## 2013-08-14 DIAGNOSIS — R109 Unspecified abdominal pain: Secondary | ICD-10-CM

## 2013-08-14 DIAGNOSIS — R197 Diarrhea, unspecified: Secondary | ICD-10-CM | POA: Insufficient documentation

## 2013-08-14 DIAGNOSIS — F329 Major depressive disorder, single episode, unspecified: Secondary | ICD-10-CM | POA: Insufficient documentation

## 2013-08-14 DIAGNOSIS — Z862 Personal history of diseases of the blood and blood-forming organs and certain disorders involving the immune mechanism: Secondary | ICD-10-CM | POA: Insufficient documentation

## 2013-08-14 DIAGNOSIS — Z8639 Personal history of other endocrine, nutritional and metabolic disease: Secondary | ICD-10-CM | POA: Insufficient documentation

## 2013-08-14 DIAGNOSIS — I10 Essential (primary) hypertension: Secondary | ICD-10-CM | POA: Insufficient documentation

## 2013-08-14 DIAGNOSIS — F411 Generalized anxiety disorder: Secondary | ICD-10-CM | POA: Insufficient documentation

## 2013-08-14 DIAGNOSIS — R112 Nausea with vomiting, unspecified: Secondary | ICD-10-CM

## 2013-08-14 DIAGNOSIS — Z79899 Other long term (current) drug therapy: Secondary | ICD-10-CM | POA: Insufficient documentation

## 2013-08-14 DIAGNOSIS — F3289 Other specified depressive episodes: Secondary | ICD-10-CM | POA: Insufficient documentation

## 2013-08-14 DIAGNOSIS — F17200 Nicotine dependence, unspecified, uncomplicated: Secondary | ICD-10-CM | POA: Insufficient documentation

## 2013-08-14 DIAGNOSIS — G589 Mononeuropathy, unspecified: Secondary | ICD-10-CM | POA: Insufficient documentation

## 2013-08-14 LAB — CBC WITH DIFFERENTIAL/PLATELET
BASOS PCT: 1 % (ref 0–1)
Basophils Absolute: 0.3 10*3/uL — ABNORMAL HIGH (ref 0.0–0.1)
EOS ABS: 5.9 10*3/uL — AB (ref 0.0–0.7)
Eosinophils Relative: 23 % — ABNORMAL HIGH (ref 0–5)
HEMATOCRIT: 41 % (ref 36.0–46.0)
HEMOGLOBIN: 14.5 g/dL (ref 12.0–15.0)
Lymphocytes Relative: 10 % — ABNORMAL LOW (ref 12–46)
Lymphs Abs: 2.6 10*3/uL (ref 0.7–4.0)
MCH: 33.1 pg (ref 26.0–34.0)
MCHC: 35.4 g/dL (ref 30.0–36.0)
MCV: 93.6 fL (ref 78.0–100.0)
MONO ABS: 1 10*3/uL (ref 0.1–1.0)
Monocytes Relative: 4 % (ref 3–12)
NEUTROS ABS: 16 10*3/uL — AB (ref 1.7–7.7)
Neutrophils Relative %: 62 % (ref 43–77)
Platelets: 320 10*3/uL (ref 150–400)
RBC: 4.38 MIL/uL (ref 3.87–5.11)
RDW: 12.2 % (ref 11.5–15.5)
WBC: 25.8 10*3/uL — ABNORMAL HIGH (ref 4.0–10.5)

## 2013-08-14 LAB — URINALYSIS, ROUTINE W REFLEX MICROSCOPIC
Bilirubin Urine: NEGATIVE
Glucose, UA: NEGATIVE mg/dL
HGB URINE DIPSTICK: NEGATIVE
Ketones, ur: NEGATIVE mg/dL
Leukocytes, UA: NEGATIVE
Nitrite: NEGATIVE
PROTEIN: NEGATIVE mg/dL
Specific Gravity, Urine: 1.021 (ref 1.005–1.030)
UROBILINOGEN UA: 0.2 mg/dL (ref 0.0–1.0)
pH: 6 (ref 5.0–8.0)

## 2013-08-14 LAB — LIPASE, BLOOD: Lipase: 43 U/L (ref 11–59)

## 2013-08-14 LAB — COMPREHENSIVE METABOLIC PANEL
ALT: 14 U/L (ref 0–35)
AST: 15 U/L (ref 0–37)
Albumin: 3.9 g/dL (ref 3.5–5.2)
Alkaline Phosphatase: 80 U/L (ref 39–117)
BUN: 10 mg/dL (ref 6–23)
CALCIUM: 9.6 mg/dL (ref 8.4–10.5)
CO2: 24 meq/L (ref 19–32)
CREATININE: 0.62 mg/dL (ref 0.50–1.10)
Chloride: 97 mEq/L (ref 96–112)
GFR calc Af Amer: >90 mL/min (ref >90)
Glucose, Bld: 119 mg/dL — ABNORMAL HIGH (ref 70–99)
Potassium: 3.3 mEq/L — ABNORMAL LOW (ref 3.7–5.3)
SODIUM: 137 meq/L (ref 137–147)
Total Bilirubin: 0.3 mg/dL (ref 0.3–1.2)
Total Protein: 7.3 g/dL (ref 6.0–8.3)

## 2013-08-14 LAB — CLOSTRIDIUM DIFFICILE BY PCR: CDIFFPCR: NEGATIVE

## 2013-08-14 MED ORDER — ONDANSETRON HCL 4 MG PO TABS
4.0000 mg | ORAL_TABLET | Freq: Four times a day (QID) | ORAL | Status: DC
Start: 2013-08-14 — End: 2015-03-19

## 2013-08-14 MED ORDER — SODIUM CHLORIDE 0.9 % IV SOLN
1000.0000 mL | Freq: Once | INTRAVENOUS | Status: AC
  Administered 2013-08-14: 1000 mL via INTRAVENOUS

## 2013-08-14 MED ORDER — HYDROCODONE-ACETAMINOPHEN 5-325 MG PO TABS: 1.0000 | ORAL_TABLET | Freq: Four times a day (QID) | ORAL | Status: DC | PRN

## 2013-08-14 MED ORDER — HYDROMORPHONE HCL PF 1 MG/ML IJ SOLN
1.0000 mg | Freq: Once | INTRAMUSCULAR | Status: AC
  Administered 2013-08-14: 1 mg via INTRAVENOUS
  Filled 2013-08-14: qty 1

## 2013-08-14 MED ORDER — FENTANYL CITRATE 0.05 MG/ML IJ SOLN
50.0000 ug | INTRAMUSCULAR | Status: DC | PRN
  Administered 2013-08-14 (×3): 50 ug via INTRAVENOUS
  Filled 2013-08-14 (×2): qty 2

## 2013-08-14 MED ORDER — PB-HYOSCY-ATROPINE-SCOPOLAMINE 16.2 MG PO TABS: 1.0000 | ORAL_TABLET | ORAL | Status: DC | PRN

## 2013-08-14 MED ORDER — ONDANSETRON HCL 4 MG/2ML IJ SOLN
4.0000 mg | Freq: Once | INTRAMUSCULAR | Status: AC
  Administered 2013-08-14: 4 mg via INTRAVENOUS
  Filled 2013-08-14: qty 2

## 2013-08-14 MED ORDER — IOHEXOL 300 MG/ML  SOLN
50.0000 mL | Freq: Once | INTRAMUSCULAR | Status: AC | PRN
  Administered 2013-08-14: 50 mL via ORAL

## 2013-08-14 MED ORDER — IOHEXOL 300 MG/ML  SOLN
100.0000 mL | Freq: Once | INTRAMUSCULAR | Status: AC | PRN
  Administered 2013-08-14: 100 mL via INTRAVENOUS

## 2013-08-14 NOTE — ED Provider Notes (Signed)
CSN: [id number]     Arrival date & time 08/14/13  0149 History   First MD Initiated Contact with Patient 08/14/13 [id number]     Chief Complaint  Patient presents with  . Abdominal Pain  . Emesis     (Consider location/radiation/quality/duration/timing/severity/associated sxs/prior Treatment) HPI History provided by patient. Diarrhea onset about 12 days ago and persistent. She has been seen by her primary care physician Dr. Perrin Maltese and had stool cultures at that time. She has been taking Pepto-Bismol and Imodium without relief. More recently he has developed associated nausea vomiting and this morning developed severe sharp lower abdominal cramping. No blood in stools. Has felt febrile without measured fever. No blood in stools. No recent travel. No known sick contacts. Patient had Giardia over 20 years ago and states it feels similar to that - she denies any recent camping, drinking creek water or known potential exposures to Giardia.  Past Medical History  Diagnosis Date  . Depression   . Anxiety   . Hyperlipidemia   . Hypertension   . Substance abuse     Alcohol - sober since 2006  . Neuropathy     post back surgery  . GERD (gastroesophageal reflux disease)     heartburn  . Right knee DJD 01/27/2013   Past Surgical History  Procedure Laterality Date  . Abdominal hysterectomy    . Spine surgery    . Back surgery  2009, July and Sept    L4, L5 Fusion,  second surgery L2-L% and S1  . Total knee arthroplasty Right 01/26/2013    Procedure: TOTAL KNEE ARTHROPLASTY;  Surgeon: Thera Flake., MD;  Location: [facility];  Service: Orthopedics;  Laterality: Right;  . Bunionectomy     Family History  Problem Relation Age of Onset  . Thyroid disease Mother   . Hypertension Father   . Anxiety disorder Sister   . Cancer Paternal Grandmother     prostate   History  Substance Use Topics  . Smoking status: Current Every Day Smoker -- 0.50 packs/day for 10 years    Types: Cigarettes  . Smokeless  tobacco: Never Used  . Alcohol Use: Yes     Comment: consumed ETOH previous couple days.Pt is a recovering alcoholic and has been sober since ~2006.   OB History   Grav Para Term Preterm Abortions TAB SAB Ect Mult Living                 Review of Systems  Constitutional: Negative for fever and chills.  Eyes: Negative for visual disturbance.  Respiratory: Negative for shortness of breath.   Cardiovascular: Negative for chest pain.  Gastrointestinal: Positive for nausea, vomiting, abdominal pain and diarrhea. Negative for blood in stool.  Genitourinary: Negative for dysuria.  Musculoskeletal: Negative for back pain, neck pain and neck stiffness.  Skin: Negative for rash.  Neurological: Negative for headaches.  All other systems reviewed and are negative.      Allergies  Celebrex and Wellbutrin  Home Medications   Current Outpatient Rx  Name  Route  Sig  Dispense  Refill  . DULoxetine (CYMBALTA) 60 MG capsule   Oral   Take 1 capsule (60 mg total) by mouth 2 (two) times daily.   60 capsule   1   . gabapentin (NEURONTIN) 800 MG tablet   Oral   Take 1 tablet (800 mg total) by mouth 4 (four) times daily.   360 tablet   3   . lisinopril-hydrochlorothiazide (PRINZIDE,ZESTORETIC) 20-25  MG per tablet   Oral   Take 1 tablet by mouth daily.   90 tablet   3   . traZODone (DESYREL) 100 MG tablet      TAKE 3 TABLETS (300 MG TOTAL) BY MOUTH AT BEDTIME.   90 tablet   1    BP 123/75  Pulse 74  Temp(Src) 98.4 F (36.9 C) (Oral)  Resp 16  SpO2 97% Physical Exam  Constitutional: She is oriented to person, place, and time. She appears well-developed and well-nourished.  HENT:  Head: Normocephalic and atraumatic.  Eyes: EOM are normal. Pupils are equal, round, and reactive to light.  Neck: Neck supple.  Cardiovascular: Normal rate, regular rhythm and intact distal pulses.   Pulmonary/Chest: Effort normal and breath sounds normal. No respiratory distress.   Musculoskeletal: Normal range of motion. She exhibits no edema.  Neurological: She is alert and oriented to person, place, and time.  Skin: Skin is warm and dry.    ED Course  Procedures (including critical care time) Results for orders placed during the hospital encounter of 08/14/13  CLOSTRIDIUM DIFFICILE BY PCR      Result Value Ref Range   C difficile by pcr NEGATIVE  NEGATIVE  CBC WITH DIFFERENTIAL      Result Value Ref Range   WBC 25.8 (*) 4.0 - 10.5 K/uL   RBC 4.38  3.87 - 5.11 MIL/uL   Hemoglobin 14.5  12.0 - 15.0 g/dL   HCT 11.5  52.0 - 80.2 %   MCV 93.6  78.0 - 100.0 fL   MCH 33.1  26.0 - 34.0 pg   MCHC 35.4  30.0 - 36.0 g/dL   RDW 23.3  61.2 - 24.4 %   Platelets 320  150 - 400 K/uL   Neutrophils Relative % 62  43 - 77 %   Lymphocytes Relative 10 (*) 12 - 46 %   Monocytes Relative 4  3 - 12 %   Eosinophils Relative 23 (*) 0 - 5 %   Basophils Relative 1  0 - 1 %   Neutro Abs 16.0 (*) 1.7 - 7.7 K/uL   Lymphs Abs 2.6  0.7 - 4.0 K/uL   Monocytes Absolute 1.0  0.1 - 1.0 K/uL   Eosinophils Absolute 5.9 (*) 0.0 - 0.7 K/uL   Basophils Absolute 0.3 (*) 0.0 - 0.1 K/uL   WBC Morphology MILD LEFT SHIFT (1-5% METAS, OCC MYELO, OCC BANDS)     Smear Review PLATELET CLUMPS NOTED ON SMEAR    COMPREHENSIVE METABOLIC PANEL      Result Value Ref Range   Sodium 137  137 - 147 mEq/L   Potassium 3.3 (*) 3.7 - 5.3 mEq/L   Chloride 97  96 - 112 mEq/L   CO2 24  19 - 32 mEq/L   Glucose, Bld 119 (*) 70 - 99 mg/dL   BUN 10  6 - 23 mg/dL   Creatinine, Ser 9.75  0.50 - 1.10 mg/dL   Calcium 9.6  8.4 - 30.0 mg/dL   Total Protein 7.3  6.0 - 8.3 g/dL   Albumin 3.9  3.5 - 5.2 g/dL   AST 15  0 - 37 U/L   ALT 14  0 - 35 U/L   Alkaline Phosphatase 80  39 - 117 U/L   Total Bilirubin 0.3  0.3 - 1.2 mg/dL   GFR calc non Af Amer >90  >90 mL/min   GFR calc Af Amer >90  >90 mL/min  LIPASE, BLOOD  Result Value Ref Range   Lipase 43  11 - 59 U/L  URINALYSIS, ROUTINE W REFLEX MICROSCOPIC       Result Value Ref Range   Color, Urine YELLOW  YELLOW   APPearance CLEAR  CLEAR   Specific Gravity, Urine 1.021  1.005 - 1.030   pH 6.0  5.0 - 8.0   Glucose, UA NEGATIVE  NEGATIVE mg/dL   Hgb urine dipstick NEGATIVE  NEGATIVE   Bilirubin Urine NEGATIVE  NEGATIVE   Ketones, ur NEGATIVE  NEGATIVE mg/dL   Protein, ur NEGATIVE  NEGATIVE mg/dL   Urobilinogen, UA 0.2  0.0 - 1.0 mg/dL   Nitrite NEGATIVE  NEGATIVE   Leukocytes, UA NEGATIVE  NEGATIVE   Ct Abdomen Pelvis W Contrast  08/14/2013   CLINICAL DATA:  Abdominal pain.  EXAM: CT ABDOMEN AND PELVIS WITH CONTRAST  TECHNIQUE: Multidetector CT imaging of the abdomen and pelvis was performed using the standard protocol following bolus administration of intravenous contrast.  CONTRAST:  100mL OMNIPAQUE IOHEXOL 300 MG/ML  SOLN  COMPARISON:  MR L SPINE WO/W CM dated 03/25/2010; CT L SPINE W/O CM dated 01/11/2008  FINDINGS: Included view of the lung bases are clear. Visualized heart and pericardium are unremarkable.  The liver is enlarged, 20 cm in craniocaudad dimension. Spleen, gallbladder, pancreas and adrenal glands are unremarkable.  The stomach, small and large bowel are normal in course and caliber without inflammatory changes. Fluid filled small and large bowel. Normal appendix. No intraperitoneal free fluid nor free air.  Kidneys are orthotopic, demonstrating symmetric enhancement without hydronephrosis or solid renal masses, 10 mm left lower pole renal cyst seen. 8 mm left lower pole, 2 mm left lower pole renal calculi. Too small to characterize hypodensity in the right kidney. The unopacified ureters are normal in course and caliber. Delayed imaging through the kidneys demonstrates symmetric prompt excretion to the proximal urinary collecting system. Urinary bladder is decompressed and unremarkable.  Great vessels are normal in course and caliber. No lymphadenopathy by CT size criteria. Status post hysterectomy. L5-S1 PLIF with posterior D compression,  minimal grade 1 L4-5 anterolisthesis is similar.  IMPRESSION: Fluid filled nondistended small and large bowel may reflect enteritis.  Nonobstructing left lower pole nephrolithiasis measure up to 8 mm.  Mild hepatomegaly.   Electronically Signed   By: Awilda Metroourtnay  Bloomer   On: 08/14/2013 06:45    IV fluids. IV Zofran. IV fentanyl. IV Dilaudid for some persistent pain.  On recheck her pain is improved. Plan discharge home, Rx pain medication provided, zofran and continue Imodium. requesting anti-spasmodic which was prescribed.   7:56 AM C diff pending - PT prefers to be discharged and f/u My Chart results. PCP and GI referral provided. Patient states understanding all discharge and followup instructions. Stool culture also pending  MDM   Dx: ABD pain, N/V/D  Evaluated with labs and imaging reviewed as above.  IV narcotics. Pain improved. Plan followup PCP with GI referral for prolonged diarrheal illness. Vital signs and nursing notes reviewed and considered.   Sunnie NielsenBrian Malynn Lucy, MD 08/15/13 [phone]

## 2013-08-14 NOTE — ED Notes (Signed)
MD at bedside. 

## 2013-08-14 NOTE — Discharge Instructions (Signed)
Diarrhea Diarrhea is frequent loose and watery bowel movements. It can cause you to feel weak and dehydrated. Dehydration can cause you to become tired and thirsty, have a dry mouth, and have decreased urination that often is dark yellow. Diarrhea is a sign of another problem, most often an infection that will not last long. In most cases, diarrhea typically lasts 2 3 days. However, it can last longer if it is a sign of something more serious. It is important to treat your diarrhea as directed by your caregive to lessen or prevent future episodes of diarrhea. CAUSES  Some common causes include:  Gastrointestinal infections caused by viruses, bacteria, or parasites.  Food poisoning or food allergies.  Certain medicines, such as antibiotics, chemotherapy, and laxatives.  Artificial sweeteners and fructose.  Digestive disorders. HOME CARE INSTRUCTIONS  Ensure adequate fluid intake (hydration): have 1 cup (8 oz) of fluid for each diarrhea episode. Avoid fluids that contain simple sugars or sports drinks, fruit juices, whole milk products, and sodas. Your urine should be clear or pale yellow if you are drinking enough fluids. Hydrate with an oral rehydration solution that you can purchase at pharmacies, retail stores, and online. You can prepare an oral rehydration solution at home by mixing the following ingredients together:    tsp table salt.   tsp baking soda.   tsp salt substitute containing potassium chloride.  1  tablespoons sugar.  1 L (34 oz) of water.  Certain foods and beverages may increase the speed at which food moves through the gastrointestinal (GI) tract. These foods and beverages should be avoided and include:  Caffeinated and alcoholic beverages.  High-fiber foods, such as raw fruits and vegetables, nuts, seeds, and whole grain breads and cereals.  Foods and beverages sweetened with sugar alcohols, such as xylitol, sorbitol, and mannitol.  Some foods may be well  tolerated and may help thicken stool including:  Starchy foods, such as rice, toast, pasta, low-sugar cereal, oatmeal, grits, baked potatoes, crackers, and bagels.  Bananas.  Applesauce.  Add probiotic-rich foods to help increase healthy bacteria in the GI tract, such as yogurt and fermented milk products.  Wash your hands well after each diarrhea episode.  Only take over-the-counter or prescription medicines as directed by your caregiver.  Take a warm bath to relieve any burning or pain from frequent diarrhea episodes. SEEK IMMEDIATE MEDICAL CARE IF:   You are unable to keep fluids down.  You have persistent vomiting.  You have blood in your stool, or your stools are black and tarry.  You do not urinate in 6 8 hours, or there is only a small amount of very dark urine.  You have abdominal pain that increases or localizes.  You have weakness, dizziness, confusion, or lightheadedness.  You have a severe headache.  Your diarrhea gets worse or does not get better.  You have a fever or persistent symptoms for more than 2 3 days.  You have a fever and your symptoms suddenly get worse. MAKE SURE YOU:   Understand these instructions.  Will watch your condition.  Will get help right away if you are not doing well or get worse. Document Released: 04/16/2002 Document Revised: 04/12/2012 Document Reviewed: 01/02/2012 [employer] Patient Information 2014 [employer].  

## 2013-08-14 NOTE — ED Notes (Signed)
Pt states that she has had N/V/D x 2 weeks and tonight the abdominal pain got so bad she could not take it. Generalized abdominal pain. Alert and oriented.

## 2013-08-16 ENCOUNTER — Ambulatory Visit (INDEPENDENT_AMBULATORY_CARE_PROVIDER_SITE_OTHER): Payer: BC Managed Care – PPO | Admitting: Family Medicine

## 2013-08-16 VITALS — BP 132/90 | HR 75 | Temp 98.2°F | Resp 18 | Wt 191.0 lb

## 2013-08-16 DIAGNOSIS — R197 Diarrhea, unspecified: Secondary | ICD-10-CM

## 2013-08-16 LAB — POCT CBC
Granulocyte percent: 80 %G (ref 37–80)
HCT, POC: 42.6 % (ref 37.7–47.9)
Hemoglobin: 13.6 g/dL (ref 12.2–16.2)
LYMPH, POC: 2 (ref 0.6–3.4)
MCH: 31.8 pg — AB (ref 27–31.2)
MCHC: 31.9 g/dL (ref 31.8–35.4)
MCV: 99.5 fL — AB (ref 80–97)
MID (CBC): 1.5 — AB (ref 0–0.9)
MPV: 10.7 fL (ref 0–99.8)
PLATELET COUNT, POC: 296 10*3/uL (ref 142–424)
POC Granulocyte: 14.1 — AB (ref 2–6.9)
POC LYMPH PERCENT: 11.2 %L (ref 10–50)
POC MID %: 8.8 % (ref 0–12)
RBC: 4.28 M/uL (ref 4.04–5.48)
RDW, POC: 13.1 %
WBC: 17.6 10*3/uL — AB (ref 4.6–10.2)

## 2013-08-16 NOTE — Progress Notes (Signed)
[facility] [address] [place] [address] [phone]  Date:  08/16/2013   Name:  Brittney Sawyer   DOB:  [birth year]   MRN:  [id number]  PCP:  Reginia Forts, MD    Chief Complaint: Follow-up and Medication Refill   History of Present Illness:  Brittney Sawyer is a 48 y.o. very pleasant female patient who presents with the following:  Seen in the ER 2 days ago with persistent diarrhea.  She was given #15 vicodin 5.    Her controlled substance report shows regular fills of hydrocodone and oxycodone, most recently from [facility]; she had a bunionectomy  She states she still has nausea, diarrhea, and stomach cramping.  This has gone on for a couple of weeks.   She needs a GI referral.    Her sx started 2 weeks ago- thought to be food poisoning at first.  She had diarrhea, then "pure liquid" diarrhea.  Also vomiting.  She might have to have diarrhea "20 times every 15 minutes."  She continues to have some severe diarrhea, and sometimes bad abdomian pain.  She was given donnatol at the ER, but "it makes me feel too drugged."   She now can eat foods like noodles, broth, mashed potatoes and keeps this down.  However more substantial foods will come up.  She tried to eat a burger but vomited it up.  She has had this sort of problem about 25 years ago due to giardia.   No recent camping, lake swimming, etc.  She does work at a daycare.  No abx used except for ?IV abs for recent bunionectomy.    She had a negative C diff at the ER.    She is not established with GI as of yet  Patient Active Problem List   Diagnosis Date Noted  . Right knee DJD 01/27/2013  . Vaginal candidiasis 01/27/2013  . Metabolic syndrome 12/75/1700  . Obesity (BMI 35.0-39.9 without comorbidity) 12/19/2012  . History of substance abuse 01/07/2012  . Menopause 11/25/2011  . Tobacco user 11/25/2011  . DDD (degenerative disc disease), lumbar 11/25/2011  . HTN (hypertension) 10/21/2011  .  Mood disorder 10/21/2011  . Chronic pain 10/21/2011    Past Medical History  Diagnosis Date  . Depression   . Anxiety   . Hyperlipidemia   . Hypertension   . Substance abuse     Alcohol - sober since 2006  . Neuropathy     post back surgery  . GERD (gastroesophageal reflux disease)     heartburn  . Right knee DJD 01/27/2013    Past Surgical History  Procedure Laterality Date  . Abdominal hysterectomy    . Spine surgery    . Back surgery  2009, July and Sept    L4, L5 Fusion,  second surgery L2-L% and S1  . Total knee arthroplasty Right 01/26/2013    Procedure: TOTAL KNEE ARTHROPLASTY;  Surgeon: Yvette Rack., MD;  Location: [facility];  Service: Orthopedics;  Laterality: Right;  . Bunionectomy      History  Substance Use Topics  . Smoking status: Current Every Day Smoker -- 0.50 packs/day for 10 years    Types: Cigarettes  . Smokeless tobacco: Never Used  . Alcohol Use: Yes     Comment: consumed ETOH previous couple days.Pt is a recovering alcoholic and has been sober since ~2006.    Family History  Problem Relation Age of Onset  . Thyroid disease Mother   .  Hypertension Father   . Anxiety disorder Sister   . Cancer Paternal Grandmother     prostate    Allergies  Allergen Reactions  . Celebrex [Celecoxib] Swelling  . Wellbutrin [Bupropion] Rash    Medication list has been reviewed and updated.  Current Outpatient Prescriptions on File Prior to Visit  Medication Sig Dispense Refill  . belladona alk-PHENObarbital (DONNATAL) 16.2 MG tablet Take 1 tablet by mouth as needed.  15 tablet  0  . DULoxetine (CYMBALTA) 60 MG capsule Take 1 capsule (60 mg total) by mouth 2 (two) times daily.  60 capsule  1  . gabapentin (NEURONTIN) 800 MG tablet Take 1 tablet (800 mg total) by mouth 4 (four) times daily.  360 tablet  3  . lisinopril-hydrochlorothiazide (PRINZIDE,ZESTORETIC) 20-25 MG per tablet Take 1 tablet by mouth daily.  90 tablet  3  . ondansetron (ZOFRAN) 4 MG tablet  Take 1 tablet (4 mg total) by mouth every 6 (six) hours.  12 tablet  0  . traZODone (DESYREL) 100 MG tablet TAKE 3 TABLETS (300 MG TOTAL) BY MOUTH AT BEDTIME.  90 tablet  1  . HYDROcodone-acetaminophen (NORCO/VICODIN) 5-325 MG per tablet Take 1 tablet by mouth every 6 (six) hours as needed.  15 tablet  0   No current facility-administered medications on file prior to visit.    Review of Systems:  As per HPI- otherwise negative.   Physical Examination: Filed Vitals:   08/16/13 1332  BP: 132/90  Pulse: 75  Temp: 98.2 F (36.8 C)  Resp: 18   Filed Vitals:   08/16/13 1332  Weight: 191 lb (86.637 kg)   Body mass index is 34.93 kg/(m^2). Ideal Body Weight:    GEN: WDWN, NAD, Non-toxic, A & O x 3, obese, looks well HEENT: Atraumatic, Normocephalic. Neck supple. No masses, No LAD. Ears and Nose: No external deformity. CV: RRR, No M/G/R. No JVD. No thrill. No extra heart sounds. PULM: CTA B, no wheezes, crackles, rhonchi. No retractions. No resp. distress. No accessory muscle use. ABD: S, NT, ND, +BS. No rebound. No HSM. EXTR: No c/c/e NEURO Normal gait.  PSYCH: Normally interactive. Conversant. Not depressed or anxious appearing.  Calm demeanor.   Results for orders placed in visit on 08/16/13  POCT CBC      Result Value Ref Range   WBC 17.6 (*) 4.6 - 10.2 K/uL   Lymph, poc 2.0  0.6 - 3.4   POC LYMPH PERCENT 11.2  10 - 50 %L   MID (cbc) 1.5 (*) 0 - 0.9   POC MID % 8.8  0 - 12 %M   POC Granulocyte 14.1 (*) 2 - 6.9   Granulocyte percent 80.0  37 - 80 %G   RBC 4.28  4.04 - 5.48 M/uL   Hemoglobin 13.6  12.2 - 16.2 g/dL   HCT, POC 42.6  37.7 - 47.9 %   MCV 99.5 (*) 80 - 97 fL   MCH, POC 31.8 (*) 27 - 31.2 pg   MCHC 31.9  31.8 - 35.4 g/dL   RDW, POC 13.1     Platelet Count, POC 296  142 - 424 K/uL   MPV 10.7  0 - 99.8 fL     Assessment and Plan: Diarrhea - Plan: POCT CBC, Stool culture, Ova and parasite examination, Basic metabolic panel  [facility]  kindly agreed to see pt today.  Collected for an O and P and stool culture today.  Of note there is a mention  in our problem list regarding history of narcotic abuse.  I did not give any narcotics today.    Signed Lamar Blinks, MD

## 2013-08-16 NOTE — Patient Instructions (Signed)
Please proceed to the office of Drs. Elnoria Howard and [facility]  [address], [place], [place] [address]  [phone]

## 2013-08-17 LAB — BASIC METABOLIC PANEL WITH GFR
BUN: 10 mg/dL (ref 6–23)
CO2: 25 meq/L (ref 19–32)
Calcium: 9.6 mg/dL (ref 8.4–10.5)
Chloride: 101 meq/L (ref 96–112)
Creat: 0.57 mg/dL (ref 0.50–1.10)
Glucose, Bld: 96 mg/dL (ref 70–99)
Potassium: 3.9 meq/L (ref 3.5–5.3)
Sodium: 138 meq/L (ref 135–145)

## 2013-08-17 LAB — OVA AND PARASITE EXAMINATION

## 2013-08-18 LAB — STOOL CULTURE

## 2013-08-20 LAB — STOOL CULTURE

## 2013-08-30 ENCOUNTER — Other Ambulatory Visit: Payer: Self-pay | Admitting: Gastroenterology

## 2013-08-30 DIAGNOSIS — R112 Nausea with vomiting, unspecified: Secondary | ICD-10-CM

## 2013-08-30 DIAGNOSIS — R1011 Right upper quadrant pain: Secondary | ICD-10-CM

## 2013-09-09 ENCOUNTER — Other Ambulatory Visit: Payer: Self-pay | Admitting: Physician Assistant

## 2013-09-13 ENCOUNTER — Ambulatory Visit (HOSPITAL_COMMUNITY): Admission: RE | Admit: 2013-09-13 | Payer: BC Managed Care – PPO | Source: Ambulatory Visit

## 2013-09-13 ENCOUNTER — Ambulatory Visit (HOSPITAL_COMMUNITY): Payer: BC Managed Care – PPO

## 2013-09-25 ENCOUNTER — Ambulatory Visit (HOSPITAL_COMMUNITY): Payer: BC Managed Care – PPO

## 2013-09-25 ENCOUNTER — Encounter (HOSPITAL_COMMUNITY): Payer: BC Managed Care – PPO

## 2013-10-24 ENCOUNTER — Other Ambulatory Visit: Payer: Self-pay | Admitting: Physician Assistant

## 2013-11-09 ENCOUNTER — Other Ambulatory Visit: Payer: Self-pay | Admitting: Physician Assistant

## 2013-11-20 ENCOUNTER — Other Ambulatory Visit: Payer: Self-pay | Admitting: Family Medicine

## 2013-11-21 NOTE — Telephone Encounter (Signed)
Called patient, what is her follow up plan?  No answer, sent Mychart message, she needs follow up.

## 2013-12-30 ENCOUNTER — Telehealth: Payer: Self-pay | Admitting: Family Medicine

## 2013-12-30 NOTE — Telephone Encounter (Signed)
Received records from the [facility]- she was recently admitted there with a septic knee.  She is being released to home and will received IV abx- 2mg  ceftriaxone daily and 500mg  of daptomycin daily.  She was on day 5/42 of her IV abx as of 8/21

## 2014-01-02 ENCOUNTER — Encounter: Payer: Self-pay | Admitting: Family Medicine

## 2014-01-02 ENCOUNTER — Telehealth: Payer: Self-pay | Admitting: Family Medicine

## 2014-01-02 NOTE — Telephone Encounter (Signed)
Received some labs from [facility]- however I am not sure if they belong to her as the DOB is slightly off.  However I do think she is received IV abx at home so these may be hers.  Called and LMOM- could she please let me know if she had labs drawn yesterday, 8/25.  These labs have my name on them but per my recollection I have not seen her since the spring and did not order these

## 2014-01-03 NOTE — Telephone Encounter (Signed)
Patient is requesting her test results.  Correct birthday is 2065-09-09  [phone]

## 2014-01-04 ENCOUNTER — Telehealth: Payer: Self-pay | Admitting: *Deleted

## 2014-01-04 ENCOUNTER — Ambulatory Visit (INDEPENDENT_AMBULATORY_CARE_PROVIDER_SITE_OTHER): Payer: BC Managed Care – PPO | Admitting: Internal Medicine

## 2014-01-04 ENCOUNTER — Encounter: Payer: Self-pay | Admitting: Internal Medicine

## 2014-01-04 VITALS — BP 120/81 | HR 89 | Temp 98.2°F | Ht 61.0 in | Wt 190.2 lb

## 2014-01-04 DIAGNOSIS — Z23 Encounter for immunization: Secondary | ICD-10-CM

## 2014-01-04 DIAGNOSIS — R197 Diarrhea, unspecified: Secondary | ICD-10-CM | POA: Diagnosis not present

## 2014-01-04 NOTE — Telephone Encounter (Signed)
Pt left a message on the lab VM inquiring about some labs. Please advise. Thanks

## 2014-01-04 NOTE — Telephone Encounter (Signed)
Spoke with Eunice Blase at [facility] pharmacy to relay weekly lab orders per Dr. Drue Second. Weekly CK, CBC, CMP to be drawn. Andree Coss, RN

## 2014-01-04 NOTE — Progress Notes (Signed)
Subjective:    Patient ID: Brittney Sawyer, female    DOB: 24-Mar-1966, 48 y.o.   MRN: [id number]  HPI Brittney Sawyer is a 48yo F with history of right knee DJD who underwent right TKA on 01/26/13 by Dr. French Ana. She had been doing well up until early august when she was in [place]. She had recurrent diarrhea for which she took 3 days of rifaximin which improved her symptoms but then by 8/10 started to have fever,chills and increasing right knee pain and swelling by 8/15. She denies any injury to her knee but did have a non healing wound to her toe that has now resolved. She sought care at [facility] where she was found to have clinical picture concerning for prosthetic joint infection. Wbc was 15.9, ESR 94, CRP 25.7. Aspirate from joint showed cloudy purulent fluid from 89m with cell count of 87,000 and 82%N. Neg gram stain, negative crystals. She was admitted for I x D and poly exchange on 8/18. She was started empirically on vancomycin and cefepime ->  Culture did not isolate any bacteria. Thus she had picc line placed on 8/19 with regimen of daptomycin plus ceftriaxone to give for 42 days. Since [facility] she was switched to ciprofloxacin plus linezolid for 2 days [facility] She is now back on Ceftriaxone 2gm IV daily plus daptomycin 5092mIV daily. No difficulty with medication. She is on antibiotic day 8 of 42 of IV antibiotics. [facility]   2 days ago she had isolated temp of 100.5. No difficulty with IV therapy. Has loose stools of 4-5x per day but she does have underlying diarrhea unclear if IBS. occ chills. Right knee is healing well. No blood or cramping with her diarrhea  Allergies  Allergen Reactions  . Celebrex [Celecoxib] Swelling  . Wellbutrin [Bupropion] Rash     Current Outpatient Prescriptions on File Prior to Visit  Medication Sig Dispense Refill  . DULoxetine (CYMBALTA) 60 MG capsule TAKE 1  CAPSULE (60 MG TOTAL) BY MOUTH 2 (TWO) TIMES DAILY.  60 capsule  2  . gabapentin (NEURONTIN) 800 MG tablet Take 1 tablet (800 mg total) by mouth 4 (four) times daily.  360 tablet  3  . HYDROcodone-acetaminophen (NORCO/VICODIN) 5-325 MG per tablet Take 1 tablet by mouth every 6 (six) hours as needed.  15 tablet  0  . lisinopril-hydrochlorothiazide (PRINZIDE,ZESTORETIC) 20-25 MG per tablet TAKE 1 TABLET BY MOUTH DAILY  30 tablet  0  . ondansetron (ZOFRAN) 4 MG tablet Take 1 tablet (4 mg total) by mouth every 6 (six) hours.  12 tablet  0  . traZODone (DESYREL) 100 MG tablet TAKE 3 TABLETS (300 MG TOTAL) BY MOUTH AT BEDTIME.  90 tablet  1  . belladona alk-PHENObarbital (DONNATAL) 16.2 MG tablet Take 1 tablet by mouth as needed.  15 tablet  0  . traZODone (DESYREL) 100 MG tablet TAKE 3 TABLETS (300 MG TOTAL) BY MOUTH AT BEDTIME.  90 tablet  1   No current facility-administered medications on file prior to visit.   Active Ambulatory Problems    Diagnosis Date Noted  . HTN (hypertension) 10/21/2011  . Mood disorder 10/21/2011  . Chronic pain 10/21/2011  . Menopause 11/25/2011  . Tobacco user 11/25/2011  . DDD (degenerative disc disease), lumbar 11/25/2011  . History of substance abuse 01/07/2012  . Obesity (BMI 35.0-39.9 without comorbidity) 12/19/2012  . Metabolic syndrome 0827/07/5007.  Right knee DJD 01/27/2013  . Vaginal candidiasis 01/27/2013   Resolved Ambulatory Problems    Diagnosis Date Noted  . No Resolved Ambulatory Problems   Past Medical History  Diagnosis Date  . Depression   . Anxiety   . Hyperlipidemia   . Hypertension   . Substance abuse   . Neuropathy   . GERD (gastroesophageal reflux disease)    History  Substance Use Topics  . Smoking status: Current Every Day Smoker -- 0.50 packs/day for 10 years    Types: Cigarettes  . Smokeless tobacco: Never Used  . Alcohol Use: Yes     Comment: consumed ETOH previous couple days.Pt is a recovering alcoholic and has been  sober since ~2006.  family history includes Anxiety disorder in her sister; Cancer in her paternal grandmother; Hypertension in her father; Thyroid disease in her mother.   Review of Systems 10 point ros is negative other than what is mentioned in hpi    Objective:   Physical Exam BP 120/81  Pulse 89  Temp(Src) 98.2 F (36.8 C) (Oral)  Ht 5' 1"  (1.549 m)  Wt 190 lb 4 oz (86.297 kg)  BMI 35.97 kg/m2 Physical Exam  Constitutional:  oriented to person, place, and time. appears well-developed and well-nourished. No distress.  HENT:  Mouth/Throat: Oropharynx is clear and moist. No oropharyngeal exudate.  Cardiovascular: Normal rate, regular rhythm and normal heart sounds. Exam reveals no gallop and no friction rub.  No murmur heard.  Pulmonary/Chest: Effort normal and breath sounds normal. No respiratory distress.  has no wheezes.  Abdominal: Soft. Bowel sounds are normal.  exhibits no distension. There is no tenderness.  Lymphadenopathy: no cervical adenopathy.  Neurological: alert and oriented to person, place, and time.  Skin: right knee has sutures in place. Incision is well healed, no areas of dehiscence or erythema Psychiatric: a normal mood and affect. His behavior is normal.    Labs: ck 47    Assessment & Plan:  Culture negative prosthetic joint infection of right TKA= continue on dapto and ceftriaxone x 6 wk. Her daptomycin dose is just under 43m/kg given her weight of 190. Needs weekly ck, cbc, and bmp. [facility] with [facility]. Will need a total of 6 months of antibiotics. At the completion of 6 wk of IV therapy, we will switch to oral antibiotics. Likely bactrim plus cipro  diarrhea = likely has exacerbation of her underlying diarrhea/IBS from antibiotics vs. Cdiff. We will do cdiff pcr testing before recommending immodium  rtc in 4-5 wk to transition to oral abtx,. Will check sed rate and crp at upcoming visit in 4 wk

## 2014-01-07 ENCOUNTER — Telehealth: Payer: Self-pay | Admitting: *Deleted

## 2014-01-07 ENCOUNTER — Other Ambulatory Visit: Payer: Self-pay | Admitting: Internal Medicine

## 2014-01-07 ENCOUNTER — Telehealth: Payer: Self-pay

## 2014-01-07 DIAGNOSIS — M009 Pyogenic arthritis, unspecified: Secondary | ICD-10-CM

## 2014-01-07 NOTE — Telephone Encounter (Signed)
Call from [facility] with [facility] with concerns that patient's husband has changed her picc line dressing 3 x in the last 4 days. Spoke with the patient and she said that she is very active and when she sweats the dressing has gotten loose. She has also been taking daily showers although she said she covers it with plastic wrap and tape. Informed her that Dr. Drue Second does not want her husband to change the dressing that it needs to be done by the home health RN. She may be discharged from nursing if she does not let them take care of the dressing changes and have weekly labs done. The other option would be an outpatient infusion center. Patient said she will abide by these guidelines and continue with [facility]. Wendall Mola

## 2014-01-07 NOTE — Telephone Encounter (Signed)
Left message in voice mail, I spoke to Dr Patsy Lager about knee injury that happened in [place] and she wants her to come to walk-in clinic to be seen. Dr Patsy Lager will to assess the injury before appointment on 01/28/2014

## 2014-01-07 NOTE — Telephone Encounter (Signed)
Dr. Patsy Lager - patient made an appt. For later in Sept. But needs lab work to monitor a wound on a weekly basis. Currently has a home heath nurse coming in to do it but it costs $250. Per visit.  Please advise patient if we are able to do labs ordered by infectious disease control doctor.  [phone]  Patient states she has spoken to you about this via email.

## 2014-01-08 ENCOUNTER — Non-Acute Institutional Stay (HOSPITAL_COMMUNITY)
Admission: AD | Admit: 2014-01-08 | Payer: BC Managed Care – PPO | Source: Ambulatory Visit | Admitting: Internal Medicine

## 2014-01-08 NOTE — Telephone Encounter (Signed)
Please advise 

## 2014-01-09 LAB — CLOSTRIDIUM DIFFICILE BY PCR: Toxigenic C. Difficile by PCR: NOT DETECTED

## 2014-01-14 ENCOUNTER — Telehealth: Payer: Self-pay | Admitting: *Deleted

## 2014-01-14 NOTE — Telephone Encounter (Signed)
Called her back to discuss.  She is already seeing Dr. Drue Second in [place], and was able to get her labs arranged per home health.   She will come in to see me soon, faxed the [facility] note that I received to Dr. Drue Second as well

## 2014-01-14 NOTE — Telephone Encounter (Signed)
Left message for Mrs Brittney Sawyer to call me back in response to her my chart message per Dr. Patsy Lager.

## 2014-01-16 ENCOUNTER — Telehealth: Payer: Self-pay | Admitting: *Deleted

## 2014-01-16 NOTE — Telephone Encounter (Signed)
Knee pain - has changed over the past couple of days, radiating down her leg.  She has called the orthopedic surgeon and shared this same message.  She was offered an appointment at his office which she declined.  Pt was interested in her lab results from [facility].  Lab results were reviewed and related to the pt.  Pt decided to continue to monitor the pain and stated that she would call her orthopedic surgeon if symptoms worsen.

## 2014-01-21 ENCOUNTER — Other Ambulatory Visit: Payer: Self-pay | Admitting: Family Medicine

## 2014-01-28 ENCOUNTER — Ambulatory Visit: Payer: BC Managed Care – PPO | Admitting: Family Medicine

## 2014-02-05 ENCOUNTER — Encounter: Payer: Self-pay | Admitting: Family Medicine

## 2014-02-06 ENCOUNTER — Encounter: Payer: Self-pay | Admitting: Internal Medicine

## 2014-02-06 ENCOUNTER — Ambulatory Visit (INDEPENDENT_AMBULATORY_CARE_PROVIDER_SITE_OTHER): Payer: BC Managed Care – PPO | Admitting: Internal Medicine

## 2014-02-06 VITALS — BP 114/80 | HR 121 | Temp 98.3°F | Ht 60.5 in | Wt 188.2 lb

## 2014-02-06 DIAGNOSIS — T889XXS Complication of surgical and medical care, unspecified, sequela: Secondary | ICD-10-CM | POA: Diagnosis not present

## 2014-02-06 DIAGNOSIS — T8450XS Infection and inflammatory reaction due to unspecified internal joint prosthesis, sequela: Secondary | ICD-10-CM

## 2014-02-06 MED ORDER — CIPROFLOXACIN HCL 500 MG PO TABS: 500.0000 mg | ORAL_TABLET | Freq: Two times a day (BID) | ORAL | Status: DC

## 2014-02-06 MED ORDER — SULFAMETHOXAZOLE-TMP DS 800-160 MG PO TABS: 1.0000 | ORAL_TABLET | Freq: Two times a day (BID) | ORAL | Status: DC

## 2014-02-06 NOTE — Progress Notes (Signed)
RN received verbal order to discontinue the patient's PICC line.  Patient identified with name and date of birth. PICC dressing removed, site unremarkable.   PICC line removed using sterile procedure @ 1000. PICC length of 50 cm. Sterile petroleum gauze + sterile 4X4 applied to PICC site, pressure applied for 10 minutes and covered with Medipore tape as a pressure dressing. Patient tolerated procedure without complaints.  Patient instructed to limit use of arm for 1 hour. Patient instructed that the pressure dressing should remain in place for 24 hours. Patient verbalized understanding of these instructions.
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
## 2014-02-14 ENCOUNTER — Other Ambulatory Visit: Payer: Self-pay | Admitting: Internal Medicine

## 2014-02-14 NOTE — Telephone Encounter (Signed)
Pt calling; says her rx request has been sent in several times from her pharmacy, and has been having trouble getting a response from [place]

## 2014-02-21 ENCOUNTER — Encounter: Payer: Self-pay | Admitting: Physical Medicine & Rehabilitation

## 2014-02-27 ENCOUNTER — Telehealth: Payer: Self-pay | Admitting: *Deleted

## 2014-02-27 DIAGNOSIS — B3731 Acute candidiasis of vulva and vagina: Secondary | ICD-10-CM

## 2014-02-27 DIAGNOSIS — B373 Candidiasis of vulva and vagina: Secondary | ICD-10-CM

## 2014-02-27 NOTE — Telephone Encounter (Signed)
She should see a pcp or come in to be seen. We treated her for pji. Can give her fluconazole 200mg  po daily x 7 days

## 2014-02-27 NOTE — Telephone Encounter (Signed)
Pt complaining of oral thrush, and vaginal yeast infection since starting oral antibiotics.  Requesting treatment.  Uses [facility] [address] in [place].  MD please advise.

## 2014-02-28 MED ORDER — FLUCONAZOLE 200 MG PO TABS: 200.0000 mg | ORAL_TABLET | Freq: Every day | ORAL | Status: AC

## 2014-02-28 NOTE — Telephone Encounter (Signed)
Pt stated that she would call her PCP.  Pt accepted the fluconazole rx too.

## 2014-02-28 NOTE — Addendum Note (Signed)
Addended by: Jennet Maduro D on: 02/28/2014 12:07 PM   Modules accepted: Orders

## 2014-03-11 ENCOUNTER — Other Ambulatory Visit: Payer: Self-pay | Admitting: Family Medicine

## 2014-03-22 ENCOUNTER — Ambulatory Visit: Payer: BC Managed Care – PPO | Admitting: Physical Medicine & Rehabilitation

## 2014-04-01 ENCOUNTER — Other Ambulatory Visit: Payer: Self-pay | Admitting: Physician Assistant

## 2014-04-02 ENCOUNTER — Telehealth: Payer: Self-pay

## 2014-04-02 NOTE — Telephone Encounter (Signed)
Pt states husband lost employment therefore they have no insurance,please refill Trazadone rx until new insurance Dec 1  Best phone for pt is [phone]   Pharmacy [facility]

## 2014-04-03 MED ORDER — TRAZODONE HCL 100 MG PO TABS: 300.0000 mg | ORAL_TABLET | Freq: Every day | ORAL | Status: DC

## 2014-04-03 NOTE — Telephone Encounter (Signed)
Lm to advise pt script sent into pharmacy

## 2014-04-03 NOTE — Telephone Encounter (Signed)
Meds ordered this encounter  Medications  . traZODone (DESYREL) 100 MG tablet    Sig: Take 3 tablets (300 mg total) by mouth at bedtime. Visit required in January 2016.    Dispense:  90 tablet    Refill:  1    Order Specific Question:  Supervising Provider    Answer:  DOOLITTLE, ROBERT P [[id number]]

## 2014-04-09 ENCOUNTER — Ambulatory Visit: Payer: BC Managed Care – PPO | Admitting: Internal Medicine

## 2014-04-27 ENCOUNTER — Other Ambulatory Visit: Payer: Self-pay | Admitting: Physician Assistant

## 2014-05-05 ENCOUNTER — Other Ambulatory Visit: Payer: Self-pay | Admitting: Internal Medicine

## 2014-05-20 ENCOUNTER — Ambulatory Visit (INDEPENDENT_AMBULATORY_CARE_PROVIDER_SITE_OTHER): Payer: 59 | Admitting: Family Medicine

## 2014-05-20 ENCOUNTER — Telehealth: Payer: Self-pay

## 2014-05-20 ENCOUNTER — Encounter: Payer: Self-pay | Admitting: Family Medicine

## 2014-05-20 VITALS — BP 102/66 | HR 95 | Temp 98.7°F | Resp 18 | Ht 62.0 in | Wt 188.0 lb

## 2014-05-20 DIAGNOSIS — Z96651 Presence of right artificial knee joint: Secondary | ICD-10-CM

## 2014-05-20 DIAGNOSIS — G4700 Insomnia, unspecified: Secondary | ICD-10-CM

## 2014-05-20 DIAGNOSIS — E119 Type 2 diabetes mellitus without complications: Secondary | ICD-10-CM | POA: Insufficient documentation

## 2014-05-20 DIAGNOSIS — I10 Essential (primary) hypertension: Secondary | ICD-10-CM

## 2014-05-20 DIAGNOSIS — M792 Neuralgia and neuritis, unspecified: Secondary | ICD-10-CM

## 2014-05-20 DIAGNOSIS — F329 Major depressive disorder, single episode, unspecified: Secondary | ICD-10-CM

## 2014-05-20 DIAGNOSIS — R7302 Impaired glucose tolerance (oral): Secondary | ICD-10-CM

## 2014-05-20 DIAGNOSIS — F32A Depression, unspecified: Secondary | ICD-10-CM

## 2014-05-20 HISTORY — DX: Type 2 diabetes mellitus without complications: E11.9

## 2014-05-20 LAB — COMPREHENSIVE METABOLIC PANEL
ALK PHOS: 67 U/L (ref 39–117)
ALT: 12 U/L (ref 0–35)
AST: 14 U/L (ref 0–37)
Albumin: 4.1 g/dL (ref 3.5–5.2)
BUN: 20 mg/dL (ref 6–23)
CALCIUM: 9.2 mg/dL (ref 8.4–10.5)
CO2: 27 mEq/L (ref 19–32)
Chloride: 101 mEq/L (ref 96–112)
Creat: 0.84 mg/dL (ref 0.50–1.10)
Glucose, Bld: 163 mg/dL — ABNORMAL HIGH (ref 70–99)
Potassium: 4.8 mEq/L (ref 3.5–5.3)
Sodium: 136 mEq/L (ref 135–145)
Total Bilirubin: 0.3 mg/dL (ref 0.2–1.2)
Total Protein: 6.7 g/dL (ref 6.0–8.3)

## 2014-05-20 LAB — POCT GLYCOSYLATED HEMOGLOBIN (HGB A1C): HEMOGLOBIN A1C: 7.2

## 2014-05-20 MED ORDER — GABAPENTIN 800 MG PO TABS: 800.0000 mg | ORAL_TABLET | Freq: Four times a day (QID) | ORAL | Status: DC

## 2014-05-20 MED ORDER — TRAZODONE HCL 100 MG PO TABS: 300.0000 mg | ORAL_TABLET | Freq: Every day | ORAL | Status: DC

## 2014-05-20 MED ORDER — LISINOPRIL-HYDROCHLOROTHIAZIDE 20-25 MG PO TABS
1.0000 | ORAL_TABLET | Freq: Every day | ORAL | Status: DC
Start: 2014-05-20 — End: 2014-10-29

## 2014-05-20 MED ORDER — DULOXETINE HCL 60 MG PO CPEP: 60.0000 mg | ORAL_CAPSULE | Freq: Two times a day (BID) | ORAL | Status: DC

## 2014-05-20 MED ORDER — METFORMIN HCL 500 MG PO TABS: 500.0000 mg | ORAL_TABLET | Freq: Two times a day (BID) | ORAL | Status: DC

## 2014-05-20 NOTE — Patient Instructions (Addendum)
I will follow-up with the rest of your labs Please do schedule a mammogram!  You can call and set this up yourself.   [facility]   Address: [address], [place], [place] [address]  [phone]  Let's recheck in about 3 months for a diabetes visit- please come fasting to your next visit!   We can work on the list below at your next visit, but if you have an eye doctor go ahead and schedule an exam  Start with metformin once a day at bedtime and go to twice a day after a couple of weeks  Continue to exercise and review the ADA website  As a diabetic, there are several things you can do to monitor your condition and maintain your health.  1. Check your feet daily for any skin breakdown 2. Exercise and keep track of your diet 3. Let us know before you run out of your medications 4. Get your annual flu shot, and ask if you need a pneumonia shot 5. Ask if you are up to date on your labs; you should have an A1c every 6 months, a urine protein test annually, and a cholesterol test annually.  Your doctor may decide to do labs more often if indicated 6. Take off your shoes and socks at each visit.  Be sure your doctor examines your feet.   7. Ask about your blood pressure.  Your goal is 130/ 80 or less 8. Get an annual eye exam.  Please ask your ophthalmologist to send [place] your report 9. Keep up with your dental cleanings and exams.

## 2014-05-20 NOTE — Telephone Encounter (Signed)
Pt was at 102 seeing Dr Patsy Lager and advised Cymbalta needs a PA. She is completely out of med. I completed covermymeds and asked for expedited review. Pt has been on cymbalts since 01/02/12 for (mainly) neuropathic pain and also depression. She is currently also taking gabapentin which does not control the pain by itself. She also tried Lyrica in the past, as well as prozac, zoloft, wellbutrin, celexa and lexapro. Pending.

## 2014-05-20 NOTE — Progress Notes (Signed)
[facility] [address], [place] [address] [phone]  Date:  05/20/2014   Name:  Brittney Sawyer   DOB:  [birth year]   MRN:  [id number]  PCP:  Reginia Forts, MD    Chief Complaint: rx refills and a1c   History of Present Illness:  Brittney Sawyer is a 49 y.o. very pleasant female patient who presents with the following:  Here today seeking a medication refill.  She also requests an A1c "just to be sure"- she has a history of pre- diabetes  She had a right knee infection following surgery 01/2013- the next year the knee became infected. She is still on bactrim and cipro per her ID doctor, Carlyle Basques.  She needs RF of a few other medications as well. She has been on 120 mg of cymbalta daily for a few years now with good results   Patient Active Problem List   Diagnosis Date Noted  . Right knee DJD 01/27/2013  . Vaginal candidiasis 01/27/2013  . Metabolic syndrome 99/37/1696  . Obesity (BMI 35.0-39.9 without comorbidity) 12/19/2012  . History of substance abuse 01/07/2012  . Menopause 11/25/2011  . Tobacco user 11/25/2011  . DDD (degenerative disc disease), lumbar 11/25/2011  . HTN (hypertension) 10/21/2011  . Mood disorder 10/21/2011  . Chronic pain 10/21/2011    Past Medical History  Diagnosis Date  . Depression   . Anxiety   . Hyperlipidemia   . Hypertension   . Substance abuse     Alcohol - sober since 2006  . Neuropathy     post back surgery  . GERD (gastroesophageal reflux disease)     heartburn  . Right knee DJD 01/27/2013    Past Surgical History  Procedure Laterality Date  . Abdominal hysterectomy    . Spine surgery    . Back surgery  2009, July and Sept    L4, L5 Fusion,  second surgery L2-L% and S1  . Total knee arthroplasty Right 01/26/2013    Procedure: TOTAL KNEE ARTHROPLASTY;  Surgeon: Yvette Rack., MD;  Location: [facility];  Service: Orthopedics;  Laterality: Right;  . Bunionectomy      History  Substance Use Topics  .  Smoking status: Current Every Day Smoker -- 0.50 packs/day for 10 years    Types: Cigarettes  . Smokeless tobacco: Never Used  . Alcohol Use: Yes     Comment: consumed ETOH previous couple days.Pt is a recovering alcoholic and has been sober since ~2006.    Family History  Problem Relation Age of Onset  . Thyroid disease Mother   . Hypertension Father   . Anxiety disorder Sister   . Cancer Paternal Grandmother     prostate    Allergies  Allergen Reactions  . Celebrex [Celecoxib] Swelling  . Wellbutrin [Bupropion] Rash    Medication list has been reviewed and updated.  Current Outpatient Prescriptions on File Prior to Visit  Medication Sig Dispense Refill  . belladona alk-PHENObarbital (DONNATAL) 16.2 MG tablet Take 1 tablet by mouth as needed. 15 tablet 0  . ciprofloxacin (CIPRO) 500 MG tablet Take 1 tablet (500 mg total) by mouth 2 (two) times daily. 60 tablet 4  . DULoxetine (CYMBALTA) 60 MG capsule TAKE ONE CAPSULE BY MOUTH TWICE A DAY 60 capsule 2  . gabapentin (NEURONTIN) 800 MG tablet Take 1 tablet (800 mg total) by mouth 4 (four) times daily. 360 tablet 3  . ibuprofen (ADVIL,MOTRIN) 200 MG tablet Take 60 mg by mouth  every 6 (six) hours as needed.    Marland Kitchen lisinopril-hydrochlorothiazide (PRINZIDE,ZESTORETIC) 20-25 MG per tablet Take 1 tablet by mouth daily. NO MORE REFILLS WITHOUT OFFICE VISIT - 2ND NOTICE 15 tablet 0  . Multiple Vitamin (MULTIVITAMIN) tablet Take 1 tablet by mouth daily.    Marland Kitchen sulfamethoxazole-trimethoprim (BACTRIM DS) 800-160 MG per tablet Take 1 tablet by mouth 2 (two) times daily. 60 tablet 4  . traZODone (DESYREL) 100 MG tablet Take 3 tablets (300 mg total) by mouth at bedtime. Visit required in January 2016. 90 tablet 1  . HYDROcodone-acetaminophen (NORCO/VICODIN) 5-325 MG per tablet Take 1 tablet by mouth every 6 (six) hours as needed. (Patient not taking: Reported on 05/20/2014) 15 tablet 0  . ondansetron (ZOFRAN) 4 MG tablet Take 1 tablet (4 mg total) by  mouth every 6 (six) hours. (Patient not taking: Reported on 05/20/2014) 12 tablet 0   No current facility-administered medications on file prior to visit.    Review of Systems:  As per HPI- otherwise negative.   Physical Examination: Filed Vitals:   05/20/14 1435  BP: 102/66  Pulse: 95  Temp: 98.7 F (37.1 C)  Resp: 18   Filed Vitals:   05/20/14 1435  Height: 5' 2"  (1.575 m)  Weight: 188 lb (85.276 kg)   Body mass index is 34.38 kg/(m^2). Ideal Body Weight: Weight in (lb) to have BMI = 25: 136.4  GEN: WDWN, NAD, Non-toxic, A & O x 3, obese, looks well HEENT: Atraumatic, Normocephalic. Neck supple. No masses, No LAD. Ears and Nose: No external deformity. CV: RRR, No M/G/R. No JVD. No thrill. No extra heart sounds. PULM: CTA B, no wheezes, crackles, rhonchi. No retractions. No resp. distress. No accessory muscle use. EXTR: No c/c/e NEURO Normal gait.  PSYCH: Normally interactive. Conversant. Not depressed or anxious appearing.  Calm demeanor.   Results for orders placed or performed in visit on 05/20/14  POCT glycosylated hemoglobin (Hb A1C)  Result Value Ref Range   Hemoglobin A1C 7.2    She started "exercising for an hour a day and changing my diet" a month or two ago.  However her husband points out that they did indulge some during the holidays  Wt Readings from Last 3 Encounters:  05/20/14 188 lb (85.276 kg)  02/06/14 188 lb 4 oz (85.39 kg)  01/04/14 190 lb 4 oz (86.297 kg)      Assessment and Plan: Depression - Plan: DULoxetine (CYMBALTA) 60 MG capsule  Impaired glucose tolerance - Plan: POCT glycosylated hemoglobin (Hb A1C), CANCELED: Hemoglobin A1c  Neuropathic pain - Plan: gabapentin (NEURONTIN) 800 MG tablet  Insomnia - Plan: traZODone (DESYREL) 100 MG tablet  Essential hypertension - Plan: lisinopril-hydrochlorothiazide (PRINZIDE,ZESTORETIC) 20-25 MG per tablet, Comprehensive metabolic panel  Type 2 diabetes mellitus without complication - Plan:  metFORMIN (GLUCOPHAGE) 500 MG tablet  Explained that she does have DM. She understands and says she was expecting to hear this news.  Will start on metformin 500 at bedtime, then BID after 2 weeks.  She has a meter and will begin checking her glucose every couple of weeks, more often if she has any sx. She declines diabetes education classes but will review the ADA website.   Encouraged her to get an eye exam and a mammogram.  See patient instructions for more details.   Plan recheck in 3 months  Signed Lamar Blinks, MD

## 2014-05-23 ENCOUNTER — Other Ambulatory Visit: Payer: Self-pay | Admitting: Physician Assistant

## 2014-05-27 NOTE — Telephone Encounter (Signed)
PA approved through 05/22/15. Notified pt on VM.

## 2014-06-16 ENCOUNTER — Other Ambulatory Visit: Payer: Self-pay | Admitting: Family Medicine

## 2014-07-02 ENCOUNTER — Telehealth: Payer: Self-pay | Admitting: Licensed Clinical Social Worker

## 2014-07-02 NOTE — Telephone Encounter (Signed)
Patient states that since her last visit she has been diagnosed with diabetes, she couldn't make the visit in November because she lost insurance.  She wanted to know if diabetes would affect her infection's healing process, she has since finished her antibiotics earlier this month.

## 2014-07-22 ENCOUNTER — Encounter: Payer: Self-pay | Admitting: Family Medicine

## 2014-07-22 ENCOUNTER — Telehealth: Payer: Self-pay | Admitting: Family Medicine

## 2014-07-22 DIAGNOSIS — F32A Depression, unspecified: Secondary | ICD-10-CM

## 2014-07-22 DIAGNOSIS — F329 Major depressive disorder, single episode, unspecified: Secondary | ICD-10-CM

## 2014-07-22 MED ORDER — TRAZODONE HCL 100 MG PO TABS: 300.0000 mg | ORAL_TABLET | Freq: Every day | ORAL | Status: DC

## 2014-07-22 NOTE — Telephone Encounter (Signed)
Pt states she contacted her pharmacy in regard to her Trazadone,and they would not refill, States she is going out of town in the am and needs her meds   Best phone for pt is [phone]   [facility]acy [facility] [address]

## 2014-07-22 NOTE — Telephone Encounter (Signed)
Sent in 1 mos RF and notified pt of RF and need for f/up per Dr Cyndie Chime OV notes w/in the month. Pt agreed.

## 2014-08-07 ENCOUNTER — Ambulatory Visit: Payer: Self-pay | Admitting: Internal Medicine

## 2014-08-27 ENCOUNTER — Other Ambulatory Visit (HOSPITAL_COMMUNITY): Payer: Self-pay | Admitting: Orthopedic Surgery

## 2014-08-27 DIAGNOSIS — T847XXA Infection and inflammatory reaction due to other internal orthopedic prosthetic devices, implants and grafts, initial encounter: Secondary | ICD-10-CM

## 2014-09-04 ENCOUNTER — Encounter (HOSPITAL_COMMUNITY)
Admission: RE | Admit: 2014-09-04 | Discharge: 2014-09-04 | Disposition: A | Discharge status: Home / Self Care | Payer: 59 | Source: Ambulatory Visit | Attending: Orthopedic Surgery | Admitting: Orthopedic Surgery

## 2014-09-04 DIAGNOSIS — T847XXA Infection and inflammatory reaction due to other internal orthopedic prosthetic devices, implants and grafts, initial encounter: Secondary | ICD-10-CM | POA: Diagnosis present

## 2014-09-04 MED ORDER — TECHNETIUM TC 99M MEDRONATE IV KIT
25.0000 | PACK | Freq: Once | INTRAVENOUS | Status: AC | PRN
  Administered 2014-09-04: 25 via INTRAVENOUS

## 2014-09-22 ENCOUNTER — Other Ambulatory Visit: Payer: Self-pay | Admitting: Family Medicine

## 2014-10-21 ENCOUNTER — Other Ambulatory Visit: Payer: Self-pay | Admitting: Family Medicine

## 2014-10-29 ENCOUNTER — Other Ambulatory Visit: Payer: Self-pay

## 2014-10-29 ENCOUNTER — Telehealth: Payer: Self-pay

## 2014-10-29 DIAGNOSIS — I10 Essential (primary) hypertension: Secondary | ICD-10-CM

## 2014-10-29 MED ORDER — LISINOPRIL-HYDROCHLOROTHIAZIDE 20-25 MG PO TABS: 1.0000 | ORAL_TABLET | Freq: Every day | ORAL | Status: DC

## 2014-10-29 NOTE — Telephone Encounter (Signed)
Left message that pt has refills on her original Rx. If she really does not then I can send this in. Left message for pt to call back.

## 2014-10-29 NOTE — Telephone Encounter (Signed)
Pt would like a refill on her ;however, she was told she can't have a refill. I can't find why. Please advise at [phone]  lisinopril-hydrochlorothiazide (PRINZIDE,ZESTORETIC) 20-25 MG per tablet [[id number]]

## 2014-11-26 ENCOUNTER — Other Ambulatory Visit: Payer: Self-pay | Admitting: Family Medicine

## 2014-12-02 ENCOUNTER — Other Ambulatory Visit: Payer: Self-pay | Admitting: Family Medicine

## 2015-01-29 ENCOUNTER — Other Ambulatory Visit: Payer: Self-pay | Admitting: Family Medicine

## 2015-01-29 NOTE — Telephone Encounter (Signed)
Patient states the pharmacy sent a refill request for lisinopril a week ago and they never heard anything back from [place]. I informed that patient that the request didn't show up in Epic Patient is now out of medication and he blood pressure hasn't been normal.

## 2015-02-14 ENCOUNTER — Telehealth: Payer: Self-pay | Admitting: Family Medicine

## 2015-02-14 NOTE — Telephone Encounter (Signed)
Left a message for patient to return call to schedule appointment for med check and to close gaps in care

## 2015-02-18 ENCOUNTER — Telehealth: Payer: Self-pay | Admitting: Family Medicine

## 2015-02-18 NOTE — Telephone Encounter (Signed)
Left a message for patient to return call to schedule appointment. 

## 2015-02-20 ENCOUNTER — Other Ambulatory Visit: Payer: Self-pay | Admitting: Family Medicine

## 2015-03-05 ENCOUNTER — Telehealth: Payer: Self-pay | Admitting: Family Medicine

## 2015-03-05 NOTE — Telephone Encounter (Signed)
Spoke with patient and she is coming in on 03/19/15 at 11:30 for a CPE with Dr. Patsy Lager.  I have paperwork from [employer] that needs to be filled out at this time.

## 2015-03-07 ENCOUNTER — Encounter: Payer: Self-pay | Admitting: Family Medicine

## 2015-03-07 ENCOUNTER — Other Ambulatory Visit: Payer: Self-pay | Admitting: Family Medicine

## 2015-03-07 MED ORDER — GABAPENTIN 800 MG PO TABS: 800.0000 mg | ORAL_TABLET | Freq: Four times a day (QID) | ORAL | Status: DC

## 2015-03-19 ENCOUNTER — Encounter: Payer: Self-pay | Admitting: Family Medicine

## 2015-03-19 ENCOUNTER — Ambulatory Visit (INDEPENDENT_AMBULATORY_CARE_PROVIDER_SITE_OTHER): Payer: 59 | Admitting: Family Medicine

## 2015-03-19 VITALS — BP 128/78 | HR 81 | Temp 98.2°F | Resp 16 | Ht 61.0 in | Wt 186.6 lb

## 2015-03-19 DIAGNOSIS — B009 Herpesviral infection, unspecified: Secondary | ICD-10-CM | POA: Diagnosis not present

## 2015-03-19 DIAGNOSIS — F32A Depression, unspecified: Secondary | ICD-10-CM

## 2015-03-19 DIAGNOSIS — I10 Essential (primary) hypertension: Secondary | ICD-10-CM

## 2015-03-19 DIAGNOSIS — Z130 Encounter for screening for diseases of the blood and blood-forming organs and certain disorders involving the immune mechanism: Secondary | ICD-10-CM

## 2015-03-19 DIAGNOSIS — Z1211 Encounter for screening for malignant neoplasm of colon: Secondary | ICD-10-CM

## 2015-03-19 DIAGNOSIS — Z23 Encounter for immunization: Secondary | ICD-10-CM

## 2015-03-19 DIAGNOSIS — F17200 Nicotine dependence, unspecified, uncomplicated: Secondary | ICD-10-CM

## 2015-03-19 DIAGNOSIS — G894 Chronic pain syndrome: Secondary | ICD-10-CM

## 2015-03-19 DIAGNOSIS — E119 Type 2 diabetes mellitus without complications: Secondary | ICD-10-CM

## 2015-03-19 DIAGNOSIS — Z135 Encounter for screening for eye and ear disorders: Secondary | ICD-10-CM | POA: Diagnosis not present

## 2015-03-19 DIAGNOSIS — F329 Major depressive disorder, single episode, unspecified: Secondary | ICD-10-CM

## 2015-03-19 DIAGNOSIS — Z0000 Encounter for general adult medical examination without abnormal findings: Secondary | ICD-10-CM | POA: Diagnosis not present

## 2015-03-19 DIAGNOSIS — Z1239 Encounter for other screening for malignant neoplasm of breast: Secondary | ICD-10-CM

## 2015-03-19 LAB — POCT GLYCOSYLATED HEMOGLOBIN (HGB A1C): HEMOGLOBIN A1C: 5.8

## 2015-03-19 LAB — CBC
HEMATOCRIT: 42.4 % (ref 36.0–46.0)
HEMOGLOBIN: 14.4 g/dL (ref 12.0–15.0)
MCH: 32.6 pg (ref 26.0–34.0)
MCHC: 34 g/dL (ref 30.0–36.0)
MCV: 95.9 fL (ref 78.0–100.0)
MPV: 11.4 fL (ref 8.6–12.4)
Platelets: 329 10*3/uL (ref 150–400)
RBC: 4.42 MIL/uL (ref 3.87–5.11)
RDW: 12.9 % (ref 11.5–15.5)
WBC: 10.1 10*3/uL (ref 4.0–10.5)

## 2015-03-19 LAB — HEMOGLOBIN A1C: Hgb A1c MFr Bld: 5.8 % (ref 4.0–6.0)

## 2015-03-19 MED ORDER — DULOXETINE HCL 60 MG PO CPEP: 60.0000 mg | ORAL_CAPSULE | Freq: Two times a day (BID) | ORAL | Status: DC

## 2015-03-19 MED ORDER — VALACYCLOVIR HCL 1 G PO TABS
1000.0000 mg | ORAL_TABLET | Freq: Three times a day (TID) | ORAL | Status: DC
Start: 2015-03-19 — End: 2015-03-19

## 2015-03-19 MED ORDER — TRAZODONE HCL 100 MG PO TABS: 300.0000 mg | ORAL_TABLET | Freq: Every day | ORAL | Status: DC

## 2015-03-19 MED ORDER — VARENICLINE TARTRATE 0.5 MG X 11 & 1 MG X 42 PO MISC: ORAL | Status: DC

## 2015-03-19 MED ORDER — VARENICLINE TARTRATE 1 MG PO TABS: 1.0000 mg | ORAL_TABLET | Freq: Two times a day (BID) | ORAL | Status: DC

## 2015-03-19 MED ORDER — LISINOPRIL-HYDROCHLOROTHIAZIDE 20-25 MG PO TABS: 1.0000 | ORAL_TABLET | Freq: Every day | ORAL | Status: DC

## 2015-03-19 MED ORDER — VALACYCLOVIR HCL 1 G PO TABS: ORAL_TABLET | ORAL | Status: DC

## 2015-03-19 MED ORDER — METFORMIN HCL 500 MG PO TABS: ORAL_TABLET | ORAL | Status: DC

## 2015-03-19 MED ORDER — GABAPENTIN 800 MG PO TABS: 800.0000 mg | ORAL_TABLET | Freq: Four times a day (QID) | ORAL | Status: DC

## 2015-03-19 NOTE — Progress Notes (Signed)
[facility] [address], [place] [address] [phone]  Date:  03/19/2015   Name:  Brittney Sawyer   DOB:  Sep 14, 1965   MRN:  [id number]  PCP:  Abbe Amsterdam, MD    Chief Complaint: Annual Exam   History of Present Illness:  Brittney Sawyer is a 49 y.o. very pleasant female patient who presents with the following:  Here today for a CPE, needs a pap today.  However she did have a hysterectomy about 15 years ago.  This was done due to painful menses, she did not have cancer. She is not sure if she has a cervix still or if she needs to continue paps She is fasting today for labs  No recent eye exam- she just got vision coverage. Mammo is overdue  She is just taking 1 metformin right now as 2 daily made her sick to her stomach. She hopes her A1c will look ok  She would like to take valtrex to use as needed for occasional HSV 2outbreaks She also needs medication refills  She is interested in chantix.   Discussed use and she would like to try this to assist in quitting smoking   Lab Results  Component Value Date   HGBA1C 7.2 05/20/2014     Patient Active Problem List   Diagnosis Date Noted  . Diabetes mellitus type 2, controlled (HCC) 05/20/2014  . Right knee DJD 01/27/2013  . Vaginal candidiasis 01/27/2013  . Metabolic syndrome 12/20/2012  . Obesity (BMI 35.0-39.9 without comorbidity) (HCC) 12/19/2012  . History of substance abuse 01/07/2012  . Menopause 11/25/2011  . Tobacco user 11/25/2011  . DDD (degenerative disc disease), lumbar 11/25/2011  . HTN (hypertension) 10/21/2011  . Mood disorder (HCC) 10/21/2011  . Chronic pain 10/21/2011    Past Medical History  Diagnosis Date  . Depression   . Anxiety   . Hyperlipidemia   . Hypertension   . Substance abuse     Alcohol - sober since 2006  . Neuropathy (HCC)     post back surgery  . GERD (gastroesophageal reflux disease)     heartburn  . Right knee DJD 01/27/2013  . Diabetes mellitus  type 2, controlled (HCC) 05/20/2014    Past Surgical History  Procedure Laterality Date  . Abdominal hysterectomy    . Spine surgery    . Back surgery  2009, July and Sept    L4, L5 Fusion,  second surgery L2-L% and S1  . Total knee arthroplasty Right 01/26/2013    Procedure: TOTAL KNEE ARTHROPLASTY;  Surgeon: Thera Flake., MD;  Location: [facility] OR;  Service: Orthopedics;  Laterality: Right;  . Bunionectomy      Social History  Substance Use Topics  . Smoking status: Current Every Day Smoker -- 0.50 packs/day for 10 years    Types: Cigarettes  . Smokeless tobacco: Never Used  . Alcohol Use: Yes     Comment: consumed ETOH previous couple days.Pt is a recovering alcoholic and has been sober since ~2006.    Family History  Problem Relation Age of Onset  . Thyroid disease Mother   . Hypertension Father   . Anxiety disorder Sister   . Cancer Paternal Grandmother     prostate    Allergies  Allergen Reactions  . Celebrex [Celecoxib] Swelling  . Wellbutrin [Bupropion] Rash    Medication list has been reviewed and updated.  Current Outpatient Prescriptions on File Prior to Visit  Medication Sig Dispense Refill  .  DULoxetine (CYMBALTA) 60 MG capsule TAKE ONE CAPSULE BY MOUTH TWICE A DAY 60 capsule 4  . gabapentin (NEURONTIN) 800 MG tablet TAKE 1 TABLET BY MOUTH 4 TIMES A DAY 360 tablet 0  . gabapentin (NEURONTIN) 800 MG tablet Take 1 tablet (800 mg total) by mouth 4 (four) times daily. 120 tablet 0  . ibuprofen (ADVIL,MOTRIN) 200 MG tablet Take 60 mg by mouth every 6 (six) hours as needed.    Marland Kitchen lisinopril-hydrochlorothiazide (PRINZIDE,ZESTORETIC) 20-25 MG per tablet TAKE 1 TABLET BY MOUTH DAILY 90 tablet 0  . metFORMIN (GLUCOPHAGE) 500 MG tablet TAKE 1 TABLET BY MOUTH TWICE DAILY WITH FOOD  "NO MORE REFILLS NEEDS APPOINTMENT 30 tablet 0  . oxyCODONE-acetaminophen (PERCOCET) 10-325 MG per tablet Take 1 tablet by mouth every 4 (four) hours as needed for pain.    . traZODone  (DESYREL) 100 MG tablet Take 3 tablets (300 mg total) by mouth at bedtime. 90 tablet 6  . Multiple Vitamin (MULTIVITAMIN) tablet Take 1 tablet by mouth daily.    . ondansetron (ZOFRAN) 4 MG tablet Take 1 tablet (4 mg total) by mouth every 6 (six) hours. (Patient not taking: Reported on 05/20/2014) 12 tablet 0   No current facility-administered medications on file prior to visit.    Review of Systems:  As per HPI- otherwise negative.   Physical Examination: Filed Vitals:   03/19/15 1138  BP: 128/78  Pulse: 81  Temp: 98.2 F (36.8 C)  Resp: 16   Filed Vitals:   03/19/15 1138  Height:  (1.549 m)  Weight: 186 lb 9.6 oz (84.641 kg)   Body mass index is 35.28 kg/(m^2). Ideal Body Weight: Weight in (lb) to have BMI = 25: 132  GEN: WDWN, NAD, Non-toxic, A & O x 3, obese, looks well HEENT: Atraumatic, Normocephalic. Neck supple. No masses, No LAD. Ears and Nose: No external deformity. CV: RRR, No M/G/R. No JVD. No thrill. No extra heart sounds. PULM: CTA B, no wheezes, crackles, rhonchi. No retractions. No resp. distress. No accessory muscle use. ABD: S, NT, ND, +BS. No rebound. No HSM. EXTR: No c/c/e NEURO Normal gait.  PSYCH: Normally interactive. Conversant. Not depressed or anxious appearing.  Calm demeanor.  Did pelvic exam- she does NOT have a cervix- no pap needed today Normal breast exam- no masses, dimpling or discharge   Results for orders placed or performed in visit on 03/19/15  POCT glycosylated hemoglobin (Hb A1C)  Result Value Ref Range   Hemoglobin A1C 5.8     Assessment and Plan: Physical exam  Need for prophylactic vaccination and inoculation against influenza - Plan: Flu Vaccine QUAD 36+ mos IM  Screening for breast cancer - Plan: MM DIGITAL SCREENING BILATERAL  Screening for colon cancer - Plan: Ambulatory referral to Gastroenterology  Screening for deficiency anemia - Plan: CBC  Controlled type 2 diabetes mellitus without complication,  without long-term current use of insulin (HCC) - Plan: Comprehensive metabolic panel, POCT glycosylated hemoglobin (Hb A1C), Microalbumin, urine, Lipid panel  Screening for diabetic retinopathy - Plan: Ambulatory referral to Ophthalmology  Depression - Plan: DULoxetine (CYMBALTA) 60 MG capsule, traZODone (DESYREL) 100 MG tablet  Essential hypertension - Plan: lisinopril-hydrochlorothiazide (PRINZIDE,ZESTORETIC) 20-25 MG tablet  Tobacco use disorder - Plan: varenicline (CHANTIX STARTING MONTH PAK) 0.5 MG X 11 & 1 MG X 42 tablet, varenicline (CHANTIX CONTINUING MONTH PAK) 1 MG tablet  HSV (herpes simplex virus) infection - Plan: valACYclovir (VALTREX) 1000 MG tablet, DISCONTINUED: valACYclovir (VALTREX) 1000 MG tablet  Chronic  pain syndrome - Plan: DULoxetine (CYMBALTA) 60 MG capsule, gabapentin (NEURONTIN) 800 MG tablet, traZODone (DESYREL) 100 MG tablet  Her DM is well controlled on her metformin 500 mg a day Referrals for eye exam, mammo, colonoscopy as above Did all of her refills rx for chantix Will plan further follow- up pending labs.    Signed Abbe Amsterdam, MD

## 2015-03-19 NOTE — Patient Instructions (Addendum)
I will be in touch with your labs asap- we will see if any changes need to be made as far as your diabetes control  Start the starter pack of your chantix 1 week before your desired quit date- you then can use the continuation packs   Use valtrex as needed- take twice a day if needed for an outbreak We will refer you to eye doctor, and for a mammogram and colonoscopy

## 2015-03-20 ENCOUNTER — Other Ambulatory Visit: Payer: Self-pay

## 2015-03-20 DIAGNOSIS — Z1231 Encounter for screening mammogram for malignant neoplasm of breast: Secondary | ICD-10-CM

## 2015-03-20 LAB — COMPREHENSIVE METABOLIC PANEL
ALBUMIN: 4.4 g/dL (ref 3.6–5.1)
ALT: 13 U/L (ref 6–29)
AST: 14 U/L (ref 10–35)
Alkaline Phosphatase: 61 U/L (ref 33–130)
BUN: 17 mg/dL (ref 7–25)
CALCIUM: 9.4 mg/dL (ref 8.6–10.4)
CHLORIDE: 101 mmol/L (ref 98–110)
CO2: 21 mmol/L (ref 20–31)
Creat: 0.7 mg/dL (ref 0.50–1.05)
Glucose, Bld: 88 mg/dL (ref 65–99)
Potassium: 4.9 mmol/L (ref 3.5–5.3)
Sodium: 137 mmol/L (ref 135–146)
TOTAL PROTEIN: 6.9 g/dL (ref 6.1–8.1)
Total Bilirubin: 0.3 mg/dL (ref 0.2–1.2)

## 2015-03-20 LAB — LIPID PANEL
CHOL/HDL RATIO: 5.4 ratio — AB (ref <=5.0)
CHOLESTEROL: 217 mg/dL — AB (ref 125–200)
HDL: 40 mg/dL — ABNORMAL LOW (ref >=46)
TRIGLYCERIDES: 438 mg/dL — AB (ref <150)

## 2015-03-20 LAB — MICROALBUMIN, URINE: Microalb, Ur: 2.2 mg/dL

## 2015-03-24 ENCOUNTER — Telehealth: Payer: Self-pay

## 2015-03-24 DIAGNOSIS — B009 Herpesviral infection, unspecified: Secondary | ICD-10-CM

## 2015-03-24 MED ORDER — ACYCLOVIR 200 MG PO CAPS: ORAL_CAPSULE | ORAL | Status: DC

## 2015-03-24 NOTE — Telephone Encounter (Signed)
valACYclovir (VALTREX) 1000 MG tablet [[id number]]      Order Details    Dose, Route, Frequency: As Directed    Dispense Quantity:  60 tablet Refills:  0 Fills Remaining:  0          Sig: Take 1 twice a day for 5 days as needed for [employer] will no longer cover, we have to Rx Acyclovir.

## 2015-03-27 ENCOUNTER — Other Ambulatory Visit: Payer: Self-pay

## 2015-03-27 ENCOUNTER — Telehealth: Payer: Self-pay

## 2015-03-27 DIAGNOSIS — Z1231 Encounter for screening mammogram for malignant neoplasm of breast: Secondary | ICD-10-CM

## 2015-03-27 NOTE — Telephone Encounter (Signed)
Pt CB and reported that she has tried lozenges, patches and gum. She also has tried bupropion and is allergic to it. Completed covermymeds. Pending.

## 2015-03-27 NOTE — Telephone Encounter (Signed)
PA needed for Chantix starter pack. Started on covermymeds, but need to ask pt is she has ever tried Nicorette, Nicoderm, Thrive lozenges, or bupropion. LMOM for pt to CB.

## 2015-03-28 NOTE — Telephone Encounter (Signed)
Rx was sent for acyclovir.

## 2015-03-31 NOTE — Telephone Encounter (Signed)
PA approved through 03/26/16. Notified pharm.

## 2015-04-10 ENCOUNTER — Encounter: Payer: Self-pay | Admitting: Family Medicine

## 2015-04-15 NOTE — Telephone Encounter (Signed)
ERROR

## 2015-05-30 ENCOUNTER — Encounter: Payer: Self-pay | Admitting: Family Medicine

## 2015-06-04 ENCOUNTER — Encounter: Payer: Self-pay | Admitting: Family Medicine

## 2015-06-10 ENCOUNTER — Other Ambulatory Visit: Payer: Self-pay | Admitting: Family Medicine

## 2015-06-10 DIAGNOSIS — N6310 Unspecified lump in the right breast, unspecified quadrant: Secondary | ICD-10-CM

## 2015-06-28 ENCOUNTER — Other Ambulatory Visit: Payer: Self-pay | Admitting: Family Medicine

## 2015-08-08 ENCOUNTER — Other Ambulatory Visit: Payer: Self-pay | Admitting: Family Medicine

## 2015-08-08 DIAGNOSIS — Z1231 Encounter for screening mammogram for malignant neoplasm of breast: Secondary | ICD-10-CM

## 2015-08-22 ENCOUNTER — Other Ambulatory Visit: Payer: Self-pay | Admitting: Family Medicine

## 2015-08-28 ENCOUNTER — Ambulatory Visit: Payer: Self-pay

## 2015-09-08 ENCOUNTER — Other Ambulatory Visit: Payer: Self-pay | Admitting: Family Medicine

## 2015-09-25 ENCOUNTER — Other Ambulatory Visit (HOSPITAL_COMMUNITY): Payer: Self-pay | Admitting: Orthopedic Surgery

## 2015-09-25 ENCOUNTER — Ambulatory Visit (INDEPENDENT_AMBULATORY_CARE_PROVIDER_SITE_OTHER): Payer: BLUE CROSS/BLUE SHIELD

## 2015-09-25 ENCOUNTER — Ambulatory Visit (INDEPENDENT_AMBULATORY_CARE_PROVIDER_SITE_OTHER): Payer: Self-pay | Admitting: Physician Assistant

## 2015-09-25 VITALS — BP 110/70 | HR 91 | Temp 98.8°F | Resp 16 | Ht 61.0 in | Wt 178.0 lb

## 2015-09-25 DIAGNOSIS — I891 Lymphangitis: Secondary | ICD-10-CM

## 2015-09-25 DIAGNOSIS — L03031 Cellulitis of right toe: Secondary | ICD-10-CM | POA: Diagnosis not present

## 2015-09-25 DIAGNOSIS — M79674 Pain in right toe(s): Secondary | ICD-10-CM | POA: Diagnosis not present

## 2015-09-25 DIAGNOSIS — M86171 Other acute osteomyelitis, right ankle and foot: Secondary | ICD-10-CM | POA: Diagnosis not present

## 2015-09-25 DIAGNOSIS — M79604 Pain in right leg: Secondary | ICD-10-CM

## 2015-09-25 DIAGNOSIS — M7989 Other specified soft tissue disorders: Secondary | ICD-10-CM

## 2015-09-25 LAB — POCT CBC
GRANULOCYTE PERCENT: 80.8 % — AB (ref 37–80)
HEMATOCRIT: 32.8 % — AB (ref 37.7–47.9)
Hemoglobin: 11.5 g/dL — AB (ref 12.2–16.2)
Lymph, poc: 1.8 (ref 0.6–3.4)
MCH, POC: 33.1 pg — AB (ref 27–31.2)
MCHC: 34.9 g/dL (ref 31.8–35.4)
MCV: 94.8 fL (ref 80–97)
MID (cbc): 0.7 (ref 0–0.9)
MPV: 8.7 fL (ref 0–99.8)
POC Granulocyte: 10.4 — AB (ref 2–6.9)
POC LYMPH %: 13.8 % (ref 10–50)
POC MID %: 5.4 % (ref 0–12)
Platelet Count, POC: 254 10*3/uL (ref 142–424)
RBC: 3.46 M/uL — AB (ref 4.04–5.48)
RDW, POC: 12.7 %
WBC: 12.9 10*3/uL — AB (ref 4.6–10.2)

## 2015-09-25 LAB — GLUCOSE, POCT (MANUAL RESULT ENTRY): POC Glucose: 118 mg/dl — AB (ref 70–99)

## 2015-09-25 MED ORDER — CEFTRIAXONE SODIUM 1 G IJ SOLR
1.0000 g | Freq: Once | INTRAMUSCULAR | Status: AC
  Administered 2015-09-25: 1 g via INTRAMUSCULAR

## 2015-09-25 NOTE — Patient Instructions (Addendum)
     IF you received an x-ray today, you will receive an invoice from [facility]. Please contact [facility] at [phone] with questions or concerns regarding your invoice.   IF you received labwork today, you will receive an invoice from [employer]. Please contact [facility] at [phone] with questions or concerns regarding your invoice.   Our billing staff will not be able to assist you with questions regarding bills from these companies.  You will be contacted with the lab results as soon as they are available. The fastest way to get your results is to activate your My Chart account. Instructions are located on the last page of this paperwork. If you have not heard from [place] regarding the results in 2 weeks, please contact this office.    She will see Dr. Farris Has at [facility] at 4:15pm.  Please arrive at 4pm.

## 2015-09-25 NOTE — Progress Notes (Signed)
[facility] [address], [place] [address] [phone]  Date:  09/25/2015   Name:  Brittney Sawyer   DOB:  1965-12-10   MRN:  [id number]  PCP:  Abbe Amsterdam, MD    History of Present Illness:  Brittney Sawyer is a 50 y.o. female patient who presents to [facility] for cc of right middle toe pain.  Patient states that she noticed yesterday, that her toe was swollen, red and painful.  She had a cut on the bottom of her toe for months which she covers with a bandaid.  She noticed today that she had redness running up her shin.  She has had fever found last night with 101.  She took advil--last administration was around 3 hours ago.  She is diabetic, with a1c around 5.8.  She has stopped smoking about 6 months ago.         Patient Active Problem List   Diagnosis Date Noted  . Diabetes mellitus type 2, controlled (HCC) 05/20/2014  . Right knee DJD 01/27/2013  . Vaginal candidiasis 01/27/2013  . Metabolic syndrome 12/20/2012  . Obesity (BMI 35.0-39.9 without comorbidity) (HCC) 12/19/2012  . History of substance abuse 01/07/2012  . Menopause 11/25/2011  . Tobacco user 11/25/2011  . DDD (degenerative disc disease), lumbar 11/25/2011  . HTN (hypertension) 10/21/2011  . Mood disorder (HCC) 10/21/2011  . Chronic pain 10/21/2011    Past Medical History  Diagnosis Date  . Depression   . Anxiety   . Hyperlipidemia   . Hypertension   . Substance abuse     Alcohol - sober since 2006  . Neuropathy (HCC)     post back surgery  . GERD (gastroesophageal reflux disease)     heartburn  . Right knee DJD 01/27/2013  . Diabetes mellitus type 2, controlled (HCC) 05/20/2014    Past Surgical History  Procedure Laterality Date  . Abdominal hysterectomy    . Spine surgery    . Back surgery  2009, July and Sept    L4, L5 Fusion,  second surgery L2-L% and S1  . Total knee arthroplasty Right 01/26/2013    Procedure: TOTAL KNEE ARTHROPLASTY;  Surgeon: Thera Flake., MD;   Location: [facility] OR;  Service: Orthopedics;  Laterality: Right;  . Bunionectomy      Social History  Substance Use Topics  . Smoking status: Former Smoker -- 0.50 packs/day for 10 years    Types: Cigarettes  . Smokeless tobacco: Never Used  . Alcohol Use: 0.0 oz/week    0 Standard drinks or equivalent per week     Comment: consumed ETOH previous couple days.Pt is a recovering alcoholic and has been sober since ~2006.    Family History  Problem Relation Age of Onset  . Thyroid disease Mother   . Hypertension Father   . Anxiety disorder Sister   . Cancer Paternal Grandmother     prostate    Allergies  Allergen Reactions  . Celebrex [Celecoxib] Swelling  . Wellbutrin [Bupropion] Rash    Medication list has been reviewed and updated.  Current Outpatient Prescriptions on File Prior to Visit  Medication Sig Dispense Refill  . acyclovir (ZOVIRAX) 200 MG capsule TAKE 2 TABLETS BY MOUTH THREE TIMES A DAY FOR 5 DAYS AS NEEDED FOR OUTBREAK 60 capsule 0  . DULoxetine (CYMBALTA) 60 MG capsule Take 1 capsule (60 mg total) by mouth 2 (two) times daily. 180 capsule 3  . gabapentin (NEURONTIN) 800 MG tablet Take  1 tablet (800 mg total) by mouth 4 (four) times daily. 480 tablet 3  . ibuprofen (ADVIL,MOTRIN) 200 MG tablet Take 60 mg by mouth every 6 (six) hours as needed.    Marland Kitchen lisinopril-hydrochlorothiazide (PRINZIDE,ZESTORETIC) 20-25 MG tablet Take 1 tablet by mouth daily. 90 tablet 3  . metFORMIN (GLUCOPHAGE) 500 MG tablet Take 1 daily for diabetes 90 tablet 3  . Multiple Vitamin (MULTIVITAMIN) tablet Take 1 tablet by mouth daily.    . traZODone (DESYREL) 100 MG tablet Take 3 tablets (300 mg total) by mouth at bedtime. 270 tablet 3  . varenicline (CHANTIX STARTING MONTH PAK) 0.5 MG X 11 & 1 MG X 42 tablet Take one 0.5 mg tablet by mouth once daily for 3 days, then increase to one 0.5 mg tablet twice daily for 4 days, then increase to one 1 mg tablet twice daily. 53 tablet 0  . CHANTIX CONTINUING  MONTH PAK 1 MG tablet TAKE 1 TABLET BY MOUTH TWICE DAILY (Patient not taking: Reported on 09/25/2015) 56 tablet 0  . oxyCODONE-acetaminophen (PERCOCET) 10-325 MG per tablet Take 1 tablet by mouth every 4 (four) hours as needed for pain. Reported on 09/25/2015    . valACYclovir (VALTREX) 1000 MG tablet Take 1 twice a day for 5 days as needed for outbreak (Patient not taking: Reported on 09/25/2015) 60 tablet 0   No current facility-administered medications on file prior to visit.    ROS ROS otherwise unremarkable unless listed above.   Physical Examination: BP 110/70 mmHg  Pulse 91  Temp(Src) 98.8 F (37.1 C)  Resp 16  Ht 5\' 1"  (1.549 m)  Wt 178 lb (80.74 kg)  BMI 33.65 kg/m2  SpO2 96% Ideal Body Weight: Weight in (lb) to have BMI = 25: 132  Physical Exam  Constitutional: She is oriented to person, place, and time. She appears well-developed and well-nourished. No distress.  HENT:  Head: Normocephalic and atraumatic.  Right Ear: External ear normal.  Left Ear: External ear normal.  Eyes: Conjunctivae and EOM are normal. Pupils are equal, round, and reactive to light.  Cardiovascular: Normal rate.   Pulmonary/Chest: Effort normal. No respiratory distress.  Musculoskeletal:  3rd middle toe erythema and swelling.  Erythema stretches up the anterior lower leg, and stops mid shin.  Tender with palpation.  Deep pressure will insite sanguinous purulent draiange.  Distal plantar position of the 3rd toe is hardened but not erythematous.  Sensation is weakened near the open wound.    Neurological: She is alert and oriented to person, place, and time.  Skin: She is not diaphoretic.  Psychiatric: She has a normal mood and affect. Her behavior is normal.    Results for orders placed or performed in visit on 09/25/15  POCT CBC  Result Value Ref Range   WBC 12.9 (A) 4.6 - 10.2 K/uL   Lymph, poc 1.8 0.6 - 3.4   POC LYMPH PERCENT 13.8 10 - 50 %L   MID (cbc) 0.7 0 - 0.9   POC MID % 5.4 0 - 12  %M   POC Granulocyte 10.4 (A) 2 - 6.9   Granulocyte percent 80.8 (A) 37 - 80 %G   RBC 3.46 (A) 4.04 - 5.48 M/uL   Hemoglobin 11.5 (A) 12.2 - 16.2 g/dL   HCT, POC 44.4 (A) 58.4 - 47.9 %   MCV 94.8 80 - 97 fL   MCH, POC 33.1 (A) 27 - 31.2 pg   MCHC 34.9 31.8 - 35.4 g/dL   RDW, POC  12.7 %   Platelet Count, POC 254 142 - 424 K/uL   MPV 8.7 0 - 99.8 fL  POCT glucose (manual entry)  Result Value Ref Range   POC Glucose 118 (A) 70 - 99 mg/dl    Dg Toe 3rd Right  08/16/8117  CLINICAL DATA:  Toe pain, stiffness, redness, and swelling was noted yesterday in there is streaking up the shin. EXAM: RIGHT THIRD TOE COMPARISON:  None in PACs FINDINGS: There is soft tissue swelling of the third toe. There is ostial ice is of the tuft and distal shaft of the distal phalanx of the third toe. The DIP and PIP joint spaces are preserved. The proximal and middle phalanges are intact as is the third metatarsal. The other phalanges and metatarsals appear intact. IMPRESSION: Findings consistent with osteomyelitis of the distal phalanx of the third toe. There is soft tissue swelling consistent with cellulitis of the surrounding soft tissues. Electronically Signed   By: David  [place] M.D.   On: 09/25/2015 12:18    Assessment and Plan: Brittney Sawyer is a 50 y.o. female who is here today for cc of of 3rd toe pain and redness. This is possibly osteomyelitis at this time.  I have contacted [facility], with consult appreciated.  They have graciously agreed to see her at 4pm with dr. Farris Has.  Given rocephin shot.  Bandaged.  Diabetes well controlled, but likely pvd major factor in her healing process.   Acute osteomyelitis of right foot (HCC) - Plan: cefTRIAXone (ROCEPHIN) injection 1 g, Wound culture  Lymphangitis - Plan: POCT CBC, POCT glucose (manual entry), cefTRIAXone (ROCEPHIN) injection 1 g  Toe pain, right - Plan: DG Toe 3rd Right, cefTRIAXone (ROCEPHIN) injection 1 g  Cellulitis of toe of right foot - Plan:  cefTRIAXone (ROCEPHIN) injection 1 g   Trena Platt, PA-C [facility] 09/25/2015 12:18 PM

## 2015-09-26 ENCOUNTER — Inpatient Hospital Stay (HOSPITAL_COMMUNITY): Admission: RE | Admit: 2015-09-26 | Payer: Self-pay | Source: Ambulatory Visit

## 2015-09-28 LAB — WOUND CULTURE
Gram Stain: NONE SEEN
Gram Stain: NONE SEEN
Gram Stain: NONE SEEN
ORGANISM ID, BACTERIA: NORMAL

## 2015-09-30 ENCOUNTER — Other Ambulatory Visit (HOSPITAL_COMMUNITY): Payer: Self-pay | Admitting: Family

## 2015-10-02 ENCOUNTER — Encounter (HOSPITAL_COMMUNITY): Payer: Self-pay | Admitting: *Deleted

## 2015-10-02 MED ORDER — CEFAZOLIN SODIUM-DEXTROSE 2-4 GM/100ML-% IV SOLN
2.0000 g | INTRAVENOUS | Status: AC
  Administered 2015-10-03: 2 g via INTRAVENOUS
  Filled 2015-10-02: qty 100

## 2015-10-02 NOTE — Progress Notes (Signed)
Pt denies cardiac history, chest pain or sob. Pt is diabetic, but does not check her blood sugar. States last A1C about 3 months ago was 5.3

## 2015-10-03 ENCOUNTER — Encounter (HOSPITAL_COMMUNITY)
Admission: RE | Disposition: A | Discharge status: Home / Self Care | Payer: Self-pay | Source: Ambulatory Visit | Attending: Orthopedic Surgery

## 2015-10-03 ENCOUNTER — Encounter (HOSPITAL_COMMUNITY): Payer: Self-pay | Admitting: Anesthesiology

## 2015-10-03 ENCOUNTER — Ambulatory Visit (HOSPITAL_COMMUNITY): Payer: BLUE CROSS/BLUE SHIELD | Admitting: Anesthesiology

## 2015-10-03 ENCOUNTER — Ambulatory Visit (HOSPITAL_COMMUNITY)
Admission: RE | Admit: 2015-10-03 | Discharge: 2015-10-03 | Disposition: A | Discharge status: Home / Self Care | Payer: BLUE CROSS/BLUE SHIELD | Source: Ambulatory Visit | Attending: Orthopedic Surgery | Admitting: Orthopedic Surgery

## 2015-10-03 DIAGNOSIS — E1169 Type 2 diabetes mellitus with other specified complication: Secondary | ICD-10-CM | POA: Diagnosis not present

## 2015-10-03 DIAGNOSIS — Z6834 Body mass index (BMI) 34.0-34.9, adult: Secondary | ICD-10-CM | POA: Insufficient documentation

## 2015-10-03 DIAGNOSIS — M869 Osteomyelitis, unspecified: Secondary | ICD-10-CM | POA: Diagnosis not present

## 2015-10-03 DIAGNOSIS — E1142 Type 2 diabetes mellitus with diabetic polyneuropathy: Secondary | ICD-10-CM | POA: Insufficient documentation

## 2015-10-03 DIAGNOSIS — Z79899 Other long term (current) drug therapy: Secondary | ICD-10-CM | POA: Insufficient documentation

## 2015-10-03 DIAGNOSIS — E669 Obesity, unspecified: Secondary | ICD-10-CM | POA: Insufficient documentation

## 2015-10-03 DIAGNOSIS — Z96651 Presence of right artificial knee joint: Secondary | ICD-10-CM | POA: Insufficient documentation

## 2015-10-03 DIAGNOSIS — I10 Essential (primary) hypertension: Secondary | ICD-10-CM | POA: Diagnosis not present

## 2015-10-03 DIAGNOSIS — Z7984 Long term (current) use of oral hypoglycemic drugs: Secondary | ICD-10-CM | POA: Diagnosis not present

## 2015-10-03 DIAGNOSIS — M86671 Other chronic osteomyelitis, right ankle and foot: Secondary | ICD-10-CM

## 2015-10-03 DIAGNOSIS — Z87891 Personal history of nicotine dependence: Secondary | ICD-10-CM | POA: Diagnosis not present

## 2015-10-03 DIAGNOSIS — F329 Major depressive disorder, single episode, unspecified: Secondary | ICD-10-CM | POA: Diagnosis not present

## 2015-10-03 HISTORY — DX: Pneumonia, unspecified organism: J18.9

## 2015-10-03 HISTORY — PX: AMPUTATION: SHX166

## 2015-10-03 LAB — COMPREHENSIVE METABOLIC PANEL
ALBUMIN: 3.9 g/dL (ref 3.5–5.0)
ALK PHOS: 56 U/L (ref 38–126)
ALT: 16 U/L (ref 14–54)
AST: 20 U/L (ref 15–41)
Anion gap: 10 (ref 5–15)
BILIRUBIN TOTAL: 0.5 mg/dL (ref 0.3–1.2)
BUN: 18 mg/dL (ref 6–20)
CALCIUM: 9.4 mg/dL (ref 8.9–10.3)
CO2: 26 mmol/L (ref 22–32)
CREATININE: 0.85 mg/dL (ref 0.44–1.00)
Chloride: 101 mmol/L (ref 101–111)
GFR calc Af Amer: >60 mL/min (ref >60)
GLUCOSE: 104 mg/dL — AB (ref 65–99)
POTASSIUM: 4.6 mmol/L (ref 3.5–5.1)
Sodium: 137 mmol/L (ref 135–145)
TOTAL PROTEIN: 6.7 g/dL (ref 6.5–8.1)

## 2015-10-03 LAB — CBC
HEMATOCRIT: 35.2 % — AB (ref 36.0–46.0)
Hemoglobin: 11.4 g/dL — ABNORMAL LOW (ref 12.0–15.0)
MCH: 31.3 pg (ref 26.0–34.0)
MCHC: 32.4 g/dL (ref 30.0–36.0)
MCV: 96.7 fL (ref 78.0–100.0)
PLATELETS: 374 10*3/uL (ref 150–400)
RBC: 3.64 MIL/uL — ABNORMAL LOW (ref 3.87–5.11)
RDW: 12.2 % (ref 11.5–15.5)
WBC: 8.9 10*3/uL (ref 4.0–10.5)

## 2015-10-03 LAB — GLUCOSE, CAPILLARY
GLUCOSE-CAPILLARY: 96 mg/dL (ref 65–99)
Glucose-Capillary: 116 mg/dL — ABNORMAL HIGH (ref 65–99)
Glucose-Capillary: 113 mg/dL — ABNORMAL HIGH (ref 65–99)

## 2015-10-03 SURGERY — AMPUTATION, FOOT, RAY
Anesthesia: Monitor Anesthesia Care | Laterality: Right

## 2015-10-03 MED ORDER — PROPOFOL 500 MG/50ML IV EMUL
INTRAVENOUS | Status: DC | PRN
  Administered 2015-10-03: 50 ug/kg/min via INTRAVENOUS

## 2015-10-03 MED ORDER — CHLORHEXIDINE GLUCONATE 4 % EX LIQD: 60.0000 mL | Freq: Once | CUTANEOUS | Status: DC

## 2015-10-03 MED ORDER — PROPOFOL 500 MG/50ML IV EMUL
INTRAVENOUS | Status: AC
  Filled 2015-10-03: qty 50

## 2015-10-03 MED ORDER — 0.9 % SODIUM CHLORIDE (POUR BTL) OPTIME
TOPICAL | Status: DC | PRN
  Administered 2015-10-03: 1000 mL

## 2015-10-03 MED ORDER — BUPIVACAINE-EPINEPHRINE (PF) 0.5% -1:200000 IJ SOLN
INTRAMUSCULAR | Status: DC | PRN
  Administered 2015-10-03: 30 mL via PERINEURAL

## 2015-10-03 MED ORDER — LACTATED RINGERS IV SOLN
INTRAVENOUS | Status: DC
  Administered 2015-10-03: 14:00:00 via INTRAVENOUS

## 2015-10-03 MED ORDER — OXYCODONE-ACETAMINOPHEN 10-325 MG PO TABS: 1.0000 | ORAL_TABLET | ORAL | Status: DC | PRN

## 2015-10-03 MED ORDER — FENTANYL CITRATE (PF) 100 MCG/2ML IJ SOLN
INTRAMUSCULAR | Status: AC
  Administered 2015-10-03: 100 ug
  Filled 2015-10-03: qty 2

## 2015-10-03 MED ORDER — MIDAZOLAM HCL 2 MG/2ML IJ SOLN
INTRAMUSCULAR | Status: AC
  Administered 2015-10-03: 2 mg
  Filled 2015-10-03: qty 2

## 2015-10-03 SURGICAL SUPPLY — 30 items
BLADE SAW SGTL MED 73X18.5 STR (BLADE) IMPLANT
BNDG COHESIVE 4X5 TAN STRL (GAUZE/BANDAGES/DRESSINGS) ×2 IMPLANT
BNDG GAUZE ELAST 4 BULKY (GAUZE/BANDAGES/DRESSINGS) ×2 IMPLANT
COVER SURGICAL LIGHT HANDLE (MISCELLANEOUS) ×4 IMPLANT
DRAPE U-SHAPE 47X51 STRL (DRAPES) ×4 IMPLANT
DRSG ADAPTIC 3X8 NADH LF (GAUZE/BANDAGES/DRESSINGS) ×2 IMPLANT
DRSG PAD ABDOMINAL 8X10 ST (GAUZE/BANDAGES/DRESSINGS) ×4 IMPLANT
DURAPREP 26ML APPLICATOR (WOUND CARE) ×2 IMPLANT
ELECT REM PT RETURN 9FT ADLT (ELECTROSURGICAL) ×2
ELECTRODE REM PT RTRN 9FT ADLT (ELECTROSURGICAL) ×1 IMPLANT
GAUZE SPONGE 4X4 12PLY STRL (GAUZE/BANDAGES/DRESSINGS) ×2 IMPLANT
GLOVE BIOGEL PI IND STRL 9 (GLOVE) ×1 IMPLANT
GLOVE BIOGEL PI INDICATOR 9 (GLOVE) ×1
GLOVE SURG ORTHO 9.0 STRL STRW (GLOVE) ×2 IMPLANT
GOWN STRL REUS W/ TWL LRG LVL3 (GOWN DISPOSABLE) ×2 IMPLANT
GOWN STRL REUS W/ TWL XL LVL3 (GOWN DISPOSABLE) ×2 IMPLANT
GOWN STRL REUS W/TWL LRG LVL3 (GOWN DISPOSABLE) ×4
GOWN STRL REUS W/TWL XL LVL3 (GOWN DISPOSABLE) ×4
KIT BASIN OR (CUSTOM PROCEDURE TRAY) ×2 IMPLANT
KIT ROOM TURNOVER OR (KITS) ×2 IMPLANT
NS IRRIG 1000ML POUR BTL (IV SOLUTION) ×2 IMPLANT
PACK ORTHO EXTREMITY (CUSTOM PROCEDURE TRAY) ×2 IMPLANT
PAD ARMBOARD 7.5X6 YLW CONV (MISCELLANEOUS) ×4 IMPLANT
SPONGE LAP 18X18 X RAY DECT (DISPOSABLE) ×2 IMPLANT
STOCKINETTE IMPERVIOUS LG (DRAPES) IMPLANT
SUT ETHILON 2 0 PSLX (SUTURE) ×4 IMPLANT
TOWEL OR 17X24 6PK STRL BLUE (TOWEL DISPOSABLE) ×2 IMPLANT
TOWEL OR 17X26 10 PK STRL BLUE (TOWEL DISPOSABLE) ×2 IMPLANT
UNDERPAD 30X30 INCONTINENT (UNDERPADS AND DIAPERS) ×2 IMPLANT
WATER STERILE IRR 1000ML POUR (IV SOLUTION) ×2 IMPLANT

## 2015-10-03 NOTE — Transfer of Care (Signed)
Immediate Anesthesia Transfer of Care Note  Patient: Brittney Sawyer  Procedure(s) Performed: Procedure(s): Right Foot 3rd Ray Amputation (Right)  Patient Location: PACU  Anesthesia Type:MAC  Level of Consciousness: awake, alert  and oriented  Airway & Oxygen Therapy: Patient Spontanous Breathing  Post-op Assessment: Report given to RN  Post vital signs: Reviewed and stable  Last Vitals:  Filed Vitals:   10/03/15 1300 10/03/15 1304  BP: 87/48 109/54  Pulse: 61 66  Temp:    Resp: 19 17    Last Pain:  Filed Vitals:   10/03/15 1305  PainSc: 5       Patients Stated Pain Goal: 3 (10/03/15 1146)  Complications: No apparent anesthesia complications

## 2015-10-03 NOTE — Progress Notes (Signed)
Orthopedic Tech Progress Note Patient Details:  Brittney Sawyer 1965/10/07 [id number] Applied post-op shoe to RLE. Ortho Devices Type of Ortho Device: Postop shoe/boot Ortho Device/Splint Location: RLE Ortho Device/Splint Interventions: Application   Lesle Chris 10/03/2015, 2:53 PM

## 2015-10-03 NOTE — Op Note (Signed)
10/03/2015  2:04 PM  PATIENT:  Brittney Sawyer    PRE-OPERATIVE DIAGNOSIS:  Osteomyelitis Right Foot 3rd Toe  POST-OPERATIVE DIAGNOSIS:  Same  PROCEDURE:  Right Foot 3rd Ray Amputation Local tissue rearrangement for wound closure 7 x 3 cm  SURGEON:  Nadara Mustard, MD  PHYSICIAN ASSISTANT:None ANESTHESIA:   General  PREOPERATIVE INDICATIONS:  Brittney Sawyer is a  50 y.o. female with a diagnosis of Osteomyelitis Right Foot 3rd Toe who failed conservative measures and elected for surgical management.    The risks benefits and alternatives were discussed with the patient preoperatively including but not limited to the risks of infection, bleeding, nerve injury, cardiopulmonary complications, the need for revision surgery, among others, and the patient was willing to proceed.  OPERATIVE IMPLANTS: None  OPERATIVE FINDINGS: Good petechial bleeding  OPERATIVE PROCEDURE: Patient was brought to the operating room and underwent a regional anesthetic. After adequate levels anesthesia were obtained patient's right lower extremity was prepped using DuraPrep draped into a sterile field a timeout was called. A incision was made dorsally over these third Ray this was carried sharply down to bone the metatarsals each dissected through the midshaft the third toe and metatarsal were resected in 1 block of tissue. Electrocautery was used for hemostasis the wound was irrigated with normal saline. Local tissue rearrangement was used for wound closure for 3 x 7 cm. A sterile compressive dressing was applied patient was taken the PACU in stable condition prescription for Percocet 10 mg tablets for discharge.

## 2015-10-03 NOTE — Anesthesia Procedure Notes (Addendum)
Procedure Name: MAC Date/Time: 10/03/2015 1:45 PM Performed by: De Nurse Pre-anesthesia Checklist: Patient identified, Emergency Drugs available, Suction available, Patient being monitored and Timeout performed Patient Re-evaluated:Patient Re-evaluated prior to inductionOxygen Delivery Method: Simple face mask   Anesthesia Regional Block:  Popliteal block  Pre-Anesthetic Checklist: ,, timeout performed, Correct Patient, Correct Site, Correct Laterality, Correct Procedure, Correct Position, site marked, Risks and benefits discussed,  Surgical consent,  Pre-op evaluation,  At surgeon's request and post-op pain management  Laterality: Lower and Right  Prep: chloraprep       Needles:  Injection technique: Single-shot  Needle Type: Echogenic Stimulator Needle          Additional Needles:  Procedures: ultrasound guided (picture in chart) and nerve stimulator Popliteal block  Nerve Stimulator or Paresthesia:  Response: plantar, 0.5 mA,   Additional Responses:   Narrative:  Injection made incrementally with aspirations every 5 mL.  Performed by: Personally  Anesthesiologist: Kage Willmann  Additional Notes: H+P and labs reviewed, risks and benefits discussed with patient, procedure tolerated well without complications

## 2015-10-03 NOTE — H&P (Signed)
Brittney Sawyer is an 50 y.o. female.   Chief Complaint: Osteomyelitis ulceration right foot third toe HPI: Patient is a 50 year old woman diabetic insensate neuropathy with ulceration osteomyelitis right foot third toe  Past Medical History  Diagnosis Date  . Depression   . Anxiety   . Hyperlipidemia   . Hypertension   . Substance abuse     Alcohol - sober since 2006  . Neuropathy (HCC)     post back surgery  . GERD (gastroesophageal reflux disease)     heartburn  . Right knee DJD 01/27/2013  . Diabetes mellitus type 2, controlled (HCC) 05/20/2014  . Pneumonia     Past Surgical History  Procedure Laterality Date  . Abdominal hysterectomy    . Spine surgery    . Back surgery  2009, July and Sept    L4, L5 Fusion,  second surgery L2-L% and S1  . Total knee arthroplasty Right 01/26/2013    Procedure: TOTAL KNEE ARTHROPLASTY;  Surgeon: Thera Flake., MD;  Location: [facility] OR;  Service: Orthopedics;  Laterality: Right;  . Bunionectomy      Family History  Problem Relation Age of Onset  . Thyroid disease Mother   . Hypertension Father   . Anxiety disorder Sister   . Cancer Paternal Grandmother     prostate   Social History:  reports that she quit smoking about 5 months ago. Her smoking use included Cigarettes. She has a 5 pack-year smoking history. She has never used smokeless tobacco. She reports that she drinks alcohol. She reports that she does not use illicit drugs.  Allergies:  Allergies  Allergen Reactions  . Celebrex [Celecoxib] Swelling    UNSPECIFIED AREA  . Wellbutrin [Bupropion] Rash    No prescriptions prior to admission    No results found for this or any previous visit (from the past 48 hour(s)). No results found.  Review of Systems  All other systems reviewed and are negative.   There were no vitals taken for this visit. Physical Exam  On examination patient has palpable pulses. She has a history of tobacco use. Patient has swelling cellulitis  ulceration and osteomyelitis right foot third toe Assessment/Plan Assessment: Diabetic insensate neuropathy with ulceration cellulitis osteomyelitis right foot third toe.  Plan: We'll plan for right third Ray amputation. Risks and benefits were discussed including risk of infection neurovascular injury nonhealing of the wound need for additional surgery. Patient states she understands wish to proceed at this time.  Nadara Mustard, MD 10/03/2015, 6:26 AM

## 2015-10-03 NOTE — Anesthesia Preprocedure Evaluation (Addendum)
Anesthesia Evaluation  Patient identified by MRN, date of birth, ID band Patient awake    Reviewed: Allergy & Precautions, NPO status , Patient's Chart, lab work & pertinent test results  Airway Mallampati: II  TM Distance: >3 FB Neck ROM: Full    Dental  (+) Teeth Intact   Pulmonary pneumonia, resolved, former smoker,    breath sounds clear to auscultation       Cardiovascular hypertension, Pt. on medications (-) angina(-) CHF  Rhythm:Regular     Neuro/Psych PSYCHIATRIC DISORDERS Anxiety Depression Peripheral neuropathy    GI/Hepatic GERD  Medicated and Controlled,(+)     substance abuse  , Hx/o Narcotic abuse   Endo/Other  diabetes, Poorly Controlled, Type 2, Oral Hypoglycemic AgentsObesity  Renal/GU   negative genitourinary   Musculoskeletal  (+) Arthritis , DDD lumbar spine Osteomyelitis Right foot 3rd toe   Abdominal   Peds  Hematology   Anesthesia Other Findings   Reproductive/Obstetrics                            Anesthesia Physical Anesthesia Plan  ASA: III  Anesthesia Plan: MAC and Regional   Post-op Pain Management:    Induction: Intravenous  Airway Management Planned: Natural Airway, Nasal Cannula and Simple Face Mask  Additional Equipment: None  Intra-op Plan:   Post-operative Plan: Extubation in OR  Informed Consent: I have reviewed the patients History and Physical, chart, labs and discussed the procedure including the risks, benefits and alternatives for the proposed anesthesia with the patient or authorized representative who has indicated his/her understanding and acceptance.   Dental advisory given  Plan Discussed with: Anesthesiologist, CRNA and Surgeon  Anesthesia Plan Comments:        Anesthesia Quick Evaluation

## 2015-10-03 NOTE — Anesthesia Postprocedure Evaluation (Signed)
Anesthesia Post Note  Patient: KARMELLA RITCHISON  Procedure(s) Performed: Procedure(s) (LRB): Right Foot 3rd Ray Amputation (Right)  Patient location during evaluation: PACU Anesthesia Type: MAC and Regional Level of consciousness: awake Pain management: pain level controlled Vital Signs Assessment: post-procedure vital signs reviewed and stable Respiratory status: spontaneous breathing Cardiovascular status: stable Postop Assessment: no signs of nausea or vomiting Anesthetic complications: no    Last Vitals:  Filed Vitals:   10/03/15 1430 10/03/15 1445  BP: 110/67 107/65  Pulse: 73 71  Temp:  36.8 C  Resp: 16 11    Last Pain:  Filed Vitals:   10/03/15 1452  PainSc: 5                  Hasna Stefanik

## 2015-10-06 ENCOUNTER — Encounter (HOSPITAL_COMMUNITY): Payer: Self-pay | Admitting: Orthopedic Surgery

## 2016-02-19 ENCOUNTER — Ambulatory Visit: Payer: BLUE CROSS/BLUE SHIELD

## 2016-02-20 ENCOUNTER — Other Ambulatory Visit: Payer: Self-pay | Admitting: Family Medicine

## 2016-02-20 DIAGNOSIS — F329 Major depressive disorder, single episode, unspecified: Secondary | ICD-10-CM

## 2016-02-20 DIAGNOSIS — G894 Chronic pain syndrome: Secondary | ICD-10-CM

## 2016-02-20 DIAGNOSIS — E119 Type 2 diabetes mellitus without complications: Secondary | ICD-10-CM

## 2016-02-20 DIAGNOSIS — F32A Depression, unspecified: Secondary | ICD-10-CM

## 2016-02-20 DIAGNOSIS — I10 Essential (primary) hypertension: Secondary | ICD-10-CM

## 2016-02-24 ENCOUNTER — Other Ambulatory Visit: Payer: Self-pay | Admitting: Family Medicine

## 2016-02-24 DIAGNOSIS — F32A Depression, unspecified: Secondary | ICD-10-CM

## 2016-02-24 DIAGNOSIS — F329 Major depressive disorder, single episode, unspecified: Secondary | ICD-10-CM

## 2016-02-24 DIAGNOSIS — G894 Chronic pain syndrome: Secondary | ICD-10-CM

## 2016-02-26 ENCOUNTER — Other Ambulatory Visit: Payer: Self-pay | Admitting: Emergency Medicine

## 2016-02-26 ENCOUNTER — Encounter: Payer: Self-pay | Admitting: Family Medicine

## 2016-02-26 DIAGNOSIS — G894 Chronic pain syndrome: Secondary | ICD-10-CM

## 2016-02-26 DIAGNOSIS — F32A Depression, unspecified: Secondary | ICD-10-CM

## 2016-02-26 DIAGNOSIS — F329 Major depressive disorder, single episode, unspecified: Secondary | ICD-10-CM

## 2016-02-26 MED ORDER — TRAZODONE HCL 100 MG PO TABS: 200.0000 mg | ORAL_TABLET | Freq: Every day | ORAL | 1 refills | Status: DC

## 2016-02-26 NOTE — Telephone Encounter (Signed)
Received refill request for traZODone 100 MG TABLET. Last office visit and refill 03/19/2015. Is it ok to refill? Please advise.

## 2016-02-27 ENCOUNTER — Other Ambulatory Visit: Payer: Self-pay | Admitting: Family Medicine

## 2016-02-27 DIAGNOSIS — F329 Major depressive disorder, single episode, unspecified: Secondary | ICD-10-CM

## 2016-02-27 DIAGNOSIS — G894 Chronic pain syndrome: Secondary | ICD-10-CM

## 2016-02-27 DIAGNOSIS — F32A Depression, unspecified: Secondary | ICD-10-CM

## 2016-03-10 ENCOUNTER — Ambulatory Visit (INDEPENDENT_AMBULATORY_CARE_PROVIDER_SITE_OTHER): Payer: BLUE CROSS/BLUE SHIELD | Admitting: Physician Assistant

## 2016-03-10 VITALS — BP 140/92 | HR 92 | Temp 98.4°F | Resp 17 | Ht 60.0 in | Wt 183.0 lb

## 2016-03-10 DIAGNOSIS — G894 Chronic pain syndrome: Secondary | ICD-10-CM

## 2016-03-10 DIAGNOSIS — E119 Type 2 diabetes mellitus without complications: Secondary | ICD-10-CM | POA: Diagnosis not present

## 2016-03-10 DIAGNOSIS — F329 Major depressive disorder, single episode, unspecified: Secondary | ICD-10-CM | POA: Diagnosis not present

## 2016-03-10 DIAGNOSIS — G4700 Insomnia, unspecified: Secondary | ICD-10-CM

## 2016-03-10 DIAGNOSIS — Z23 Encounter for immunization: Secondary | ICD-10-CM | POA: Diagnosis not present

## 2016-03-10 DIAGNOSIS — N912 Amenorrhea, unspecified: Secondary | ICD-10-CM

## 2016-03-10 DIAGNOSIS — Z716 Tobacco abuse counseling: Secondary | ICD-10-CM

## 2016-03-10 DIAGNOSIS — Z720 Tobacco use: Secondary | ICD-10-CM

## 2016-03-10 DIAGNOSIS — I10 Essential (primary) hypertension: Secondary | ICD-10-CM

## 2016-03-10 DIAGNOSIS — F32A Depression, unspecified: Secondary | ICD-10-CM

## 2016-03-10 LAB — COMPLETE METABOLIC PANEL WITH GFR
ALBUMIN: 4.4 g/dL (ref 3.6–5.1)
ALT: 16 U/L (ref 6–29)
AST: 18 U/L (ref 10–35)
Alkaline Phosphatase: 67 U/L (ref 33–130)
BUN: 11 mg/dL (ref 7–25)
CHLORIDE: 102 mmol/L (ref 98–110)
CO2: 28 mmol/L (ref 20–31)
Calcium: 9.3 mg/dL (ref 8.6–10.4)
Creat: 0.81 mg/dL (ref 0.50–1.05)
GFR, EST NON AFRICAN AMERICAN: 84 mL/min (ref >=60)
GFR, Est African American: >89 mL/min (ref >=60)
GLUCOSE: 77 mg/dL (ref 65–99)
POTASSIUM: 4.8 mmol/L (ref 3.5–5.3)
SODIUM: 139 mmol/L (ref 135–146)
Total Bilirubin: 0.4 mg/dL (ref 0.2–1.2)
Total Protein: 6.7 g/dL (ref 6.1–8.1)

## 2016-03-10 MED ORDER — LISINOPRIL-HYDROCHLOROTHIAZIDE 20-25 MG PO TABS: 1.0000 | ORAL_TABLET | Freq: Every day | ORAL | 1 refills | Status: DC

## 2016-03-10 MED ORDER — METFORMIN HCL 500 MG PO TABS: 500.0000 mg | ORAL_TABLET | Freq: Every day | ORAL | 1 refills | Status: DC

## 2016-03-10 MED ORDER — DULOXETINE HCL 60 MG PO CPEP: 60.0000 mg | ORAL_CAPSULE | Freq: Two times a day (BID) | ORAL | 1 refills | Status: DC

## 2016-03-10 MED ORDER — TRAZODONE HCL 100 MG PO TABS: 300.0000 mg | ORAL_TABLET | Freq: Every day | ORAL | 1 refills | Status: DC

## 2016-03-10 MED ORDER — VARENICLINE TARTRATE 0.5 MG PO TABS: 0.5000 mg | ORAL_TABLET | Freq: Two times a day (BID) | ORAL | 5 refills | Status: DC

## 2016-03-10 MED ORDER — GABAPENTIN 800 MG PO TABS: 800.0000 mg | ORAL_TABLET | Freq: Four times a day (QID) | ORAL | 1 refills | Status: DC

## 2016-03-10 NOTE — Patient Instructions (Addendum)
Please restart your blood pressure at this time.  1.800.quit.now hotline for extra assistance to your smoking cessation.  Steps to Quit Smoking  Smoking tobacco can be harmful to your health and can affect almost every organ in your body. Smoking puts you, and those around you, at risk for developing many serious chronic diseases. Quitting smoking is difficult, but it is one of the best things that you can do for your health. It is never too late to quit. WHAT ARE THE BENEFITS OF QUITTING SMOKING? When you quit smoking, you lower your risk of developing serious diseases and conditions, such as:  Lung cancer or lung disease, such as COPD.  Heart disease.  Stroke.  Heart attack.  Infertility.  Osteoporosis and bone fractures. Additionally, symptoms such as coughing, wheezing, and shortness of breath may get better when you quit. You may also find that you get sick less often because your body is stronger at fighting off colds and infections. If you are pregnant, quitting smoking can help to reduce your chances of having a baby of low birth weight. HOW DO I GET READY TO QUIT? When you decide to quit smoking, create a plan to make sure that you are successful. Before you quit:  Pick a date to quit. Set a date within the next two weeks to give you time to prepare.  Write down the reasons why you are quitting. Keep this list in places where you will see it often, such as on your bathroom mirror or in your car or wallet.  Identify the people, places, things, and activities that make you want to smoke (triggers) and avoid them. Make sure to take these actions:  Throw away all cigarettes at home, at work, and in your car.  Throw away smoking accessories, such as Set designer.  Clean your car and make sure to empty the ashtray.  Clean your home, including curtains and carpets.  Tell your family, friends, and coworkers that you are quitting. Support from your loved ones can make  quitting easier.  Talk with your health care provider about your options for quitting smoking.  Find out what treatment options are covered by your health insurance. WHAT STRATEGIES CAN I USE TO QUIT SMOKING?  Talk with your healthcare provider about different strategies to quit smoking. Some strategies include:  Quitting smoking altogether instead of gradually lessening how much you smoke over a period of time. Research shows that quitting "cold [place]" is more successful than gradually quitting.  Attending in-person counseling to help you build problem-solving skills. You are more likely to have success in quitting if you attend several counseling sessions. Even short sessions of 10 minutes can be effective.  Finding resources and support systems that can help you to quit smoking and remain smoke-free after you quit. These resources are most helpful when you use them often. They can include:  Online chats with a Veterinary surgeon.  Telephone quitlines.  Printed Materials engineer.  Support groups or group counseling.  Text messaging programs.  Mobile phone applications.  Taking medicines to help you quit smoking. (If you are pregnant or breastfeeding, talk with your health care provider first.) Some medicines contain nicotine and some do not. Both types of medicines help with cravings, but the medicines that include nicotine help to relieve withdrawal symptoms. Your health care provider may recommend:  Nicotine patches, gum, or lozenges.  Nicotine inhalers or sprays.  Non-nicotine medicine that is taken by mouth. Talk with your health care provider about  combining strategies, such as taking medicines while you are also receiving in-person counseling. Using these two strategies together makes you more likely to succeed in quitting than if you used either strategy on its own. If you are pregnant or breastfeeding, talk with your health care provider about finding counseling or other support  strategies to quit smoking. Do not take medicine to help you quit smoking unless told to do so by your health care provider. WHAT THINGS CAN I DO TO MAKE IT EASIER TO QUIT? Quitting smoking might feel overwhelming at first, but there is a lot that you can do to make it easier. Take these important actions:  Reach out to your family and friends and ask that they support and encourage you during this time. Call telephone quitlines, reach out to support groups, or work with a counselor for support.  Ask people who smoke to avoid smoking around you.  Avoid places that trigger you to smoke, such as bars, parties, or smoke-break areas at work.  Spend time around people who do not smoke.  Lessen stress in your life, because stress can be a smoking trigger for some people. To lessen stress, try:  Exercising regularly.  Deep-breathing exercises.  Yoga.  Meditating.  Performing a body scan. This involves closing your eyes, scanning your body from head to toe, and noticing which parts of your body are particularly tense. Purposefully relax the muscles in those areas.  Download or purchase mobile phone or tablet apps (applications) that can help you stick to your quit plan by providing reminders, tips, and encouragement. There are many free apps, such as QuitGuide from the [employer] developer(Centers for Disease Control and Prevention). You can find other support for quitting smoking (smoking cessation) through smokefree.gov and other websites. HOW WILL I FEEL WHEN I QUIT SMOKING? Within the first 24 hours of quitting smoking, you may start to feel some withdrawal symptoms. These symptoms are usually most noticeable 2-3 days after quitting, but they usually do not last beyond 2-3 weeks. Changes or symptoms that you might experience include:  Mood swings.  Restlessness, anxiety, or irritation.  Difficulty concentrating.  Dizziness.  Strong cravings for sugary foods in addition to nicotine.  Mild weight  gain.  Constipation.  Nausea.  Coughing or a sore throat.  Changes in how your medicines work in your body.  A depressed mood.  Difficulty sleeping (insomnia). After the first 2-3 weeks of quitting, you may start to notice more positive results, such as:  Improved sense of smell and taste.  Decreased coughing and sore throat.  Slower heart rate.  Lower blood pressure.  Clearer skin.  The ability to breathe more easily.  Fewer sick days. Quitting smoking is very challenging for most people. Do not get discouraged if you are not successful the first time. Some people need to make many attempts to quit before they achieve long-term success. Do your best to stick to your quit plan, and talk with your health care provider if you have any questions or concerns.   This information is not intended to replace advice given to you by your health care provider. Make sure you discuss any questions you have with your health care provider.   Document Released: 04/20/2001 Document Revised: 09/10/2014 Document Reviewed: 09/10/2014 Elsevier Interactive Patient Education 2016 [employer]       IF you received an x-ray today, you will receive an invoice from [facility]. Please contact [facility] at [phone] with questions or concerns regarding your  invoice.   IF you received labwork today, you will receive an invoice from [employer]. Please contact [facility] at [phone] with questions or concerns regarding your invoice.   Our billing staff will not be able to assist you with questions regarding bills from these companies.  You will be contacted with the lab results as soon as they are available. The fastest way to get your results is to activate your My Chart account. Instructions are located on the last page of this paperwork. If you have not heard from [place] regarding the results in 2 weeks, please contact this office.     We  recommend that you schedule a mammogram for breast cancer screening. Typically, you do not need a referral to do this. Please contact a local imaging center to schedule your mammogram.  [facility] - [phone]  *ask for the Radiology Department [facility]) - [phone] or [phone]  [facility] - [phone] [facility] - [phone] [facility] - [phone]  *ask for the Radiology Department [facility] - [phone]  *ask for the Radiology Department [facility] - [phone]  *ask for the Mammography Department [facility] - [phone]

## 2016-03-11 LAB — HEMOGLOBIN A1C
Hgb A1c MFr Bld: 4.9 % (ref <5.7)
Mean Plasma Glucose: 94 mg/dL

## 2016-03-11 NOTE — Progress Notes (Signed)
[facility] [address], [place] [address] [phone]  Date:  03/10/2016   Name:  Brittney DeterLaura C Sawyer   DOB:  May 31, 1965   MRN:  [id number]  PCP:  Trena PlattStephanie Laquida Cotrell, PA    History of Present Illness:  Brittney Sawyer is a 50 y.o. female patient who [facility].   Chronic pain-patient states that her pain has been well controlled on the Cymbalta. She said she also asked only as an antidepressant which has worked out well for her. She denies any current pains.  She is currently also taking the gabapentin 4 times a day for nerve pain. She states that this is working well for her as well. She has been without her antihypertensive, lisinopril-hydrochlorothiazide for 1 week. She has noticed her blood pressure elevating. She denies any chest pain, palpitations, shortness of breath, leg swelling, vision changes. She is trying to eat well and avoid sodium and fried foods. She would like to quit smoking at this time. She would prefer to be restarted on Chantix. She has used this medication before without any issue. One year ago she had stopped smoking for 6 months. She restarted after having several tests including her stepfather, mother-in-law, grandmother. She states that she recalls feeling much better with her breathing and overall energy. She would also like a refill of the trazodone. She takes 3 tablets at night. This works well for her and keeps her in sleep.    Patient Active Problem List   Diagnosis Date Noted  . Diabetes mellitus type 2, controlled (HCC) 05/20/2014  . Right knee DJD 01/27/2013  . Vaginal candidiasis 01/27/2013  . Metabolic syndrome 12/20/2012  . Obesity (BMI 35.0-39.9 without comorbidity) 12/19/2012  . History of substance abuse 01/07/2012  . Menopause 11/25/2011  . Tobacco user 11/25/2011  . DDD (degenerative disc disease), lumbar 11/25/2011  . HTN (hypertension) 10/21/2011  . Mood disorder (HCC) 10/21/2011  . Chronic  pain 10/21/2011    Past Medical History:  Diagnosis Date  . Anxiety   . Depression   . Diabetes mellitus type 2, controlled (HCC) 05/20/2014  . GERD (gastroesophageal reflux disease)    heartburn  . Hyperlipidemia   . Hypertension   . Neuropathy (HCC)    post back surgery  . Pneumonia   . Right knee DJD 01/27/2013  . Substance abuse    Alcohol - sober since 2006    Past Surgical History:  Procedure Laterality Date  . ABDOMINAL HYSTERECTOMY    . AMPUTATION Right 10/03/2015   Procedure: Right Foot 3rd Ray Amputation;  Surgeon: Nadara MustardMarcus Duda V, MD;  [facility];  Service: Orthopedics;  Laterality: Right;  . BACK SURGERY  2009, July and Sept   L4, L5 Fusion,  second surgery L2-L% and S1  . BUNIONECTOMY    . SPINE SURGERY    . TOTAL KNEE ARTHROPLASTY Right 01/26/2013   Procedure: TOTAL KNEE ARTHROPLASTY;  Surgeon: Thera FlakeW D Caffrey Jr., MD;  Location: [facility] OR;  Service: Orthopedics;  Laterality: Right;    Social History  Substance Use Topics  . Smoking status: Current Every Day Smoker    Packs/day: 0.50    Years: 10.00    Types: Cigarettes  . Smokeless tobacco: Never Used  . Alcohol use 0.0 oz/week     Comment: consumed ETOH previous couple days.Pt is a recovering alcoholic and has been sober since ~2006.    Family History  Problem Relation Age of Onset  .  Thyroid disease Mother   . Hypertension Father   . Anxiety disorder Sister   . Cancer Paternal Grandmother     prostate    Allergies  Allergen Reactions  . Celebrex [Celecoxib] Swelling    UNSPECIFIED AREA  . Wellbutrin [Bupropion] Rash    Medication list has been reviewed and updated.  Current Outpatient Prescriptions on File Prior to Visit  Medication Sig Dispense Refill  . acyclovir (ZOVIRAX) 200 MG capsule TAKE 2 TABLETS BY MOUTH THREE TIMES A DAY FOR 5 DAYS AS NEEDED FOR OUTBREAK 60 capsule 0  . ibuprofen (ADVIL,MOTRIN) 200 MG tablet Take 800 mg by mouth 2 (two) times daily as needed for mild pain.     .  Multiple Vitamin (MULTIVITAMIN) tablet Take 1 tablet by mouth daily.    Marland Kitchen oxyCODONE-acetaminophen (PERCOCET) 10-325 MG tablet Take 1 tablet by mouth every 4 (four) hours as needed for pain. 30 tablet 0   No current facility-administered medications on file prior to visit.     ROS ROS otherwise unremarkable unless listed above.  Physical Examination: BP (!) 140/92 (BP Location: Left Arm, Patient Position: Sitting, Cuff Size: Large)   Pulse 92   Temp 98.4 F (36.9 C) (Oral)   Resp 17   Ht 5' (1.524 m)   Wt 183 lb (83 kg)   SpO2 96%   BMI 35.74 kg/m  Ideal Body Weight: Weight in (lb) to have BMI = 25: 127.7  Physical Exam  Constitutional: She is oriented to person, place, and time. She appears well-developed and well-nourished. No distress.  HENT:  Head: Normocephalic and atraumatic.  Right Ear: External ear normal.  Left Ear: External ear normal.  Eyes: Conjunctivae and EOM are normal. Pupils are equal, round, and reactive to light.  Cardiovascular: Normal rate and regular rhythm.  Exam reveals no gallop and no distant heart sounds.   No murmur heard. Pulmonary/Chest: Effort normal. No respiratory distress. She has no decreased breath sounds. She has no wheezes. She has no rhonchi.  Neurological: She is alert and oriented to person, place, and time.  Skin: She is not diaphoretic.  Psychiatric: She has a normal mood and affect. Her behavior is normal.     Assessment and Plan: Brittney Sawyer is a 50 y.o. female who is here today for medication refill of her Cymbalta, gabapentin, HCTZ-lisinopril, trazodone, and metformin. She would also like to be started on a medication to improve her smoking cessation. 1. Essential hypertension We will restart the lisinopril/hydrochlorothiazide. I've advised her to return in 6 months following. Metabolic panel has been obtained today. We will also check an A1c. - COMPLETE METABOLIC PANEL WITH GFR - Hemoglobin A1c -  lisinopril-hydrochlorothiazide (PRINZIDE,ZESTORETIC) 20-25 MG tablet; Take 1 tablet by mouth daily.  Dispense: 90 tablet; Refill: 1  2. Depression, unspecified depression type Stable refilling medications of the trazodone and Cymbalta at this time. - traZODone (DESYREL) 100 MG tablet; Take 3 tablets (300 mg total) by mouth at bedtime.  Dispense: 270 tablet; Refill: 1  3. Chronic pain syndrome Stable - traZODone (DESYREL) 100 MG tablet; Take 3 tablets (300 mg total) by mouth at bedtime.  Dispense: 270 tablet; Refill: 1 - DULoxetine (CYMBALTA) 60 MG capsule; Take 1 capsule (60 mg total) by mouth 2 (two) times daily.  Dispense: 180 capsule; Refill: 1 - gabapentin (NEURONTIN) 800 MG tablet; Take 1 tablet (800 mg total) by mouth 4 (four) times daily.  Dispense: 480 tablet; Refill: 1  4. Controlled type 2 diabetes mellitus without  complication, without long-term current use of insulin (HCC) Refilling metformin at this time. A1c obtained. Patient has generally been well controlled. I will refill this for 6 months. - metFORMIN (GLUCOPHAGE) 500 MG tablet; Take 1 tablet (500 mg total) by mouth at bedtime.  Dispense: 90 tablet; Refill: 1  5. Flu vaccine need Given - Flu Vaccine QUAD 36+ mos IM   Trena Platt, PA-C [facility] 03/11/2016 8:54 AM

## 2016-04-27 ENCOUNTER — Other Ambulatory Visit: Payer: Self-pay | Admitting: Physician Assistant

## 2016-04-30 NOTE — Telephone Encounter (Signed)
03/10/16 last ov 

## 2016-05-20 ENCOUNTER — Ambulatory Visit: Payer: BLUE CROSS/BLUE SHIELD

## 2016-05-22 ENCOUNTER — Ambulatory Visit (INDEPENDENT_AMBULATORY_CARE_PROVIDER_SITE_OTHER): Payer: BLUE CROSS/BLUE SHIELD

## 2016-05-22 ENCOUNTER — Ambulatory Visit (INDEPENDENT_AMBULATORY_CARE_PROVIDER_SITE_OTHER): Payer: BLUE CROSS/BLUE SHIELD | Admitting: Physician Assistant

## 2016-05-22 VITALS — BP 104/70 | HR 90 | Temp 98.6°F | Resp 18 | Ht 60.0 in | Wt 170.2 lb

## 2016-05-22 DIAGNOSIS — R42 Dizziness and giddiness: Secondary | ICD-10-CM | POA: Diagnosis not present

## 2016-05-22 DIAGNOSIS — R0602 Shortness of breath: Secondary | ICD-10-CM | POA: Diagnosis not present

## 2016-05-22 DIAGNOSIS — J22 Unspecified acute lower respiratory infection: Secondary | ICD-10-CM

## 2016-05-22 DIAGNOSIS — H65112 Acute and subacute allergic otitis media (mucoid) (sanguinous) (serous), left ear: Secondary | ICD-10-CM

## 2016-05-22 DIAGNOSIS — R05 Cough: Secondary | ICD-10-CM

## 2016-05-22 DIAGNOSIS — R058 Other specified cough: Secondary | ICD-10-CM

## 2016-05-22 DIAGNOSIS — H6982 Other specified disorders of Eustachian tube, left ear: Secondary | ICD-10-CM | POA: Diagnosis not present

## 2016-05-22 LAB — POCT CBC
Granulocyte percent: 80.7 %G — AB (ref 37–80)
HEMATOCRIT: 37.9 % (ref 37.7–47.9)
HEMOGLOBIN: 13 g/dL (ref 12.2–16.2)
Lymph, poc: 2.6 (ref 0.6–3.4)
MCH: 32.4 pg — AB (ref 27–31.2)
MCHC: 34.3 g/dL (ref 31.8–35.4)
MCV: 94.3 fL (ref 80–97)
MID (cbc): 0.2 (ref 0–0.9)
MPV: 8.2 fL (ref 0–99.8)
POC GRANULOCYTE: 11.9 — AB (ref 2–6.9)
POC LYMPH PERCENT: 17.7 %L (ref 10–50)
POC MID %: 1.6 % (ref 0–12)
Platelet Count, POC: 356 10*3/uL (ref 142–424)
RBC: 4.01 M/uL — AB (ref 4.04–5.48)
RDW, POC: 12 %
WBC: 14.7 10*3/uL — AB (ref 4.6–10.2)

## 2016-05-22 MED ORDER — GUAIFENESIN ER 1200 MG PO TB12: 1.0000 | ORAL_TABLET | Freq: Two times a day (BID) | ORAL | 1 refills | Status: DC | PRN

## 2016-05-22 MED ORDER — HYDROCOD POLST-CPM POLST ER 10-8 MG/5ML PO SUER: 5.0000 mL | Freq: Every evening | ORAL | 0 refills | Status: DC | PRN

## 2016-05-22 MED ORDER — AZITHROMYCIN 250 MG PO TABS: ORAL_TABLET | ORAL | 0 refills | Status: DC

## 2016-05-22 NOTE — Progress Notes (Deleted)
     IF you received an x-ray today, you will receive an invoice from [facility]. Please contact [facility] at [phone] with questions or concerns regarding your invoice.   IF you received labwork today, you will receive an invoice from [facility]. Please contact [facility] at [phone] with questions or concerns regarding your invoice.   Our billing staff will not be able to assist you with questions regarding bills from these companies.  You will be contacted with the lab results as soon as they are available. The fastest way to get your results is to activate your My Chart account. Instructions are located on the last page of this paperwork. If you have not heard from us regarding the results in 2 weeks, please contact this office.     

## 2016-05-22 NOTE — Patient Instructions (Signed)
Please hydrate appropriately with 64 oz if not more. Continue the nasacort.  Please use the mucinex as prescribed. Take antibiotic to completion.   Await contact for CT scan.

## 2016-05-22 NOTE — Progress Notes (Deleted)
[facility] [address], [place] [address] [phone]  Date:  05/22/2016   Name:  Brittney Sawyer   DOB:  Mar 29, 1966   MRN:  [id number]  PCP:  Trena Platt, PA    History of Present Illness:  Brittney Sawyer is a 51 y.o. female patient who presents to [facility]  For cc of  Chief Complaint  Patient presents with  . Headache    x 11 days  . Otalgia  . Sore Throat  . Chest Pain  . Shortness of Breath    Started with cold-like symptoms.  She was having a productive cough of green sputum, throughout the day.  The sinus pain.  The motrin or tylenol will help.  Mucinex, nasal allergy medication.  She has had subjective fever and chills.  She is having dizziness.  Felt some pain in her cheeks.  Today, she has some sob.   Intentional weight loss--eating better, and stopped smoking.  Husband and her are both making these lifestyle modifications.  Uses chantix at this time. Denies sob, long-standing fatigue, or nightsweats.   Patient Active Problem List   Diagnosis Date Noted  . Diabetes mellitus type 2, controlled (HCC) 05/20/2014  . Right knee DJD 01/27/2013  . Vaginal candidiasis 01/27/2013  . Metabolic syndrome 12/20/2012  . Obesity (BMI 35.0-39.9 without comorbidity) 12/19/2012  . History of substance abuse 01/07/2012  . Menopause 11/25/2011  . Tobacco user 11/25/2011  . DDD (degenerative disc disease), lumbar 11/25/2011  . HTN (hypertension) 10/21/2011  . Mood disorder (HCC) 10/21/2011  . Chronic pain 10/21/2011    Past Medical History:  Diagnosis Date  . Anxiety   . Depression   . Diabetes mellitus type 2, controlled (HCC) 05/20/2014  . GERD (gastroesophageal reflux disease)    heartburn  . Hyperlipidemia   . Hypertension   . Neuropathy (HCC)    post back surgery  . Pneumonia   . Right knee DJD 01/27/2013  . Substance abuse    Alcohol - sober since 2006    Past Surgical History:  Procedure Laterality Date  . ABDOMINAL HYSTERECTOMY    .  AMPUTATION Right 10/03/2015   Procedure: Right Foot 3rd Ray Amputation;  Surgeon: Nadara Mustard, MD;  Location: [facility] OR;  Service: Orthopedics;  Laterality: Right;  . BACK SURGERY  2009, July and Sept   L4, L5 Fusion,  second surgery L2-L% and S1  . BUNIONECTOMY    . JOINT REPLACEMENT    . SPINE SURGERY    . TOTAL KNEE ARTHROPLASTY Right 01/26/2013   Procedure: TOTAL KNEE ARTHROPLASTY;  Surgeon: Thera Flake., MD;  Location: [facility] OR;  Service: Orthopedics;  Laterality: Right;    Social History  Substance Use Topics  . Smoking status: Former Smoker    Packs/day: 0.50    Years: 10.00    Types: Cigarettes    Quit date: 03/10/2016  . Smokeless tobacco: Never Used  . Alcohol use 0.0 oz/week     Comment: consumed ETOH previous couple days.Pt is a recovering alcoholic and has been sober since ~2006.    Family History  Problem Relation Age of Onset  . Thyroid disease Mother   . Hypertension Father   . Anxiety disorder Sister   . Cancer Paternal Grandmother     prostate    Allergies  Allergen Reactions  . Celebrex [Celecoxib] Swelling    UNSPECIFIED AREA  . Wellbutrin [Bupropion] Rash    Medication list has been reviewed  and updated.  Current Outpatient Prescriptions on File Prior to Visit  Medication Sig Dispense Refill  . acyclovir (ZOVIRAX) 200 MG capsule TAKE 2 CAPSULES BY MOUTH THREE TIMES A DAY FOR 5 DAYS AS NEEDED FOR OUTBREAK 60 capsule 0  . DULoxetine (CYMBALTA) 60 MG capsule Take 1 capsule (60 mg total) by mouth 2 (two) times daily. 180 capsule 1  . gabapentin (NEURONTIN) 800 MG tablet Take 1 tablet (800 mg total) by mouth 4 (four) times daily. 480 tablet 1  . ibuprofen (ADVIL,MOTRIN) 200 MG tablet Take 800 mg by mouth 2 (two) times daily as needed for mild pain.     Marland Kitchen lisinopril-hydrochlorothiazide (PRINZIDE,ZESTORETIC) 20-25 MG tablet Take 1 tablet by mouth daily. 90 tablet 1  . metFORMIN (GLUCOPHAGE) 500 MG tablet Take 1 tablet (500 mg total) by mouth at bedtime. 90  tablet 1  . Multiple Vitamin (MULTIVITAMIN) tablet Take 1 tablet by mouth daily.    . traZODone (DESYREL) 100 MG tablet Take 3 tablets (300 mg total) by mouth at bedtime. 270 tablet 1  . varenicline (CHANTIX) 0.5 MG tablet Take 1 tablet (0.5 mg total) by mouth 2 (two) times daily. Start 1 tablet daily for 3 days, then take 1 tablet twice per day for four days.  Maximum dose of 2mg  per day 60 tablet 5   No current facility-administered medications on file prior to visit.     ROS ROS otherwise unremarkable unless listed above.   Physical Examination: BP 104/70   Pulse 90   Temp 98.6 F (37 C) (Oral)   Resp 18   Ht 5' (1.524 m)   Wt 170 lb 4 oz (77.2 kg)   SpO2 99%   BMI 33.25 kg/m  Ideal Body Weight: Weight in (lb) to have BMI = 25: 127.7  Physical Exam  Constitutional: She is oriented to person, place, and time. She appears well-developed and well-nourished. No distress.  HENT:  Head: Normocephalic and atraumatic.  Right Ear: External ear and ear canal normal. Tympanic membrane is injected and retracted. A middle ear effusion is present.  Left Ear: Tympanic membrane, external ear and ear canal normal.  Nose: Mucosal edema and rhinorrhea present. Right sinus exhibits no maxillary sinus tenderness and no frontal sinus tenderness. Left sinus exhibits no maxillary sinus tenderness and no frontal sinus tenderness.  Mouth/Throat: No uvula swelling. No oropharyngeal exudate, posterior oropharyngeal edema or posterior oropharyngeal erythema.  Eyes: Conjunctivae and EOM are normal. Pupils are equal, round, and reactive to light.  Cardiovascular: Normal rate and regular rhythm.  Exam reveals no gallop, no distant heart sounds and no friction rub.   No murmur heard. Pulmonary/Chest: Effort normal. No respiratory distress. She has no decreased breath sounds. She has no wheezes. She has no rhonchi.  Lymphadenopathy:       Head (right side): No submandibular, no tonsillar, no preauricular and  no posterior auricular adenopathy present.       Head (left side): No submandibular, no tonsillar, no preauricular and no posterior auricular adenopathy present.  Neurological: She is alert and oriented to person, place, and time.  Skin: She is not diaphoretic.  Psychiatric: She has a normal mood and affect. Her behavior is normal.   Results for orders placed or performed in visit on 05/22/16  POCT CBC  Result Value Ref Range   WBC 14.7 (A) 4.6 - 10.2 K/uL   Lymph, poc 2.6 0.6 - 3.4   POC LYMPH PERCENT 17.7 10 - 50 %L   MID (cbc)  0.2 0 - 0.9   POC MID % 1.6 0 - 12 %M   POC Granulocyte 11.9 (A) 2 - 6.9   Granulocyte percent 80.7 (A) 37 - 80 %G   RBC 4.01 (A) 4.04 - 5.48 M/uL   Hemoglobin 13.0 12.2 - 16.2 g/dL   HCT, POC 16.1 09.6 - 47.9 %   MCV 94.3 80 - 97 fL   MCH, POC 32.4 (A) 27 - 31.2 pg   MCHC 34.3 31.8 - 35.4 g/dL   RDW, POC 04.5 %   Platelet Count, POC 356 142 - 424 K/uL   MPV 8.2 0 - 99.8 fL    Dg Chest 2 View  Result Date: 05/22/2016 CLINICAL DATA:  productive cough EXAM: CHEST  2 VIEW COMPARISON:  01/04/2013 FINDINGS: Moderate thoracic spondylosis. Midline trachea. Normal heart size and mediastinal contours. No pleural effusion or pneumothorax. Relatively well-circumscribed left upper lobe nodule measures 1.5 cm. IMPRESSION: No acute cardiopulmonary disease. 1.5 cm left upper lobe pulmonary nodule. Although this may represent a benign entity, given its well-circumscribed appearance, further evaluation with chest CT is recommended. These results will be called to the ordering clinician or representative by the Radiologist Assistant, and communication documented in the PACS or zVision Dashboard. Electronically Signed   By: Jeronimo Greaves M.D.   On: 05/22/2016 15:04     Assessment and Plan: CARMINA WALLE is a 51 y.o. female who is here today for chief complaint of congestion, sinus pressure, sore throat, cough, and chest tightness. I'm treating for lower respiratory infection  and ear infection.  Advised to continue Nasacort. Incidental finding of a solitary pulmonary nodule at the left. We will follow-up with a CT without contrast at this time. After review, and in read, this is likely benign.. Lower respiratory infection (e.g., bronchitis, pneumonia, pneumonitis, pulmonitis)  Acute mucoid otitis media of left ear - Plan: azithromycin (ZITHROMAX) 250 MG tablet  Productive cough - Plan: POCT CBC, DG Chest 2 View, Guaifenesin (MUCINEX MAXIMUM STRENGTH) 1200 MG TB12, chlorpheniramine-HYDROcodone (TUSSIONEX PENNKINETIC ER) 10-8 MG/5ML SUER  SOB (shortness of breath) - Plan: POCT CBC, DG Chest 2 View, Guaifenesin (MUCINEX MAXIMUM STRENGTH) 1200 MG TB12, chlorpheniramine-HYDROcodone (TUSSIONEX PENNKINETIC ER) 10-8 MG/5ML SUER  Dizziness - Plan: POCT CBC, DG Chest 2 View, Guaifenesin (MUCINEX MAXIMUM STRENGTH) 1200 MG TB12, chlorpheniramine-HYDROcodone (TUSSIONEX PENNKINETIC ER) 10-8 MG/5ML SUER  Dysfunction of left eustachian tube - Plan: Guaifenesin (MUCINEX MAXIMUM STRENGTH) 1200 MG TB12, chlorpheniramine-HYDROcodone (TUSSIONEX PENNKINETIC ER) 10-8 MG/5ML SUER  Trena Platt, PA-C [facility] 1/21/20185:49 PM

## 2016-05-22 NOTE — Progress Notes (Signed)
[facility] [address] [place] [address] [phone]  Date:  05/22/2016   Name:  Brittney Sawyer   DOB:  12/08/1965   MRN:  [id number]  PCP:  Trena Platt, PA    History of Present Illness:  Brittney Sawyer is a 51 y.o. female patient who presents to [facility] for cc of headache, cough, sinus pressure, and sore throat for about 11 days.    Patient reports that she has cough that has progressively worsened.  Now has subjective fever and chills.  There is chest tightness though no sob.  Feels fatigue with doing daily activities.  Cough is productive of white and yellow sputum.  Facial pain along her cheeks.  Mucus same color.  No results found.  Intentional weight loss.  Wt Readings from Last 3 Encounters:  05/22/16 170 lb 4 oz (77.2 kg)  03/10/16 183 lb (83 kg)  10/03/15 175 lb (79.4 kg)      Patient Active Problem List   Diagnosis Date Noted  . Diabetes mellitus type 2, controlled (HCC) 05/20/2014  . Right knee DJD 01/27/2013  . Vaginal candidiasis 01/27/2013  . Metabolic syndrome 12/20/2012  . Obesity (BMI 35.0-39.9 without comorbidity) 12/19/2012  . History of substance abuse 01/07/2012  . Menopause 11/25/2011  . Tobacco user 11/25/2011  . DDD (degenerative disc disease), lumbar 11/25/2011  . HTN (hypertension) 10/21/2011  . Mood disorder (HCC) 10/21/2011  . Chronic pain 10/21/2011    Past Medical History:  Diagnosis Date  . Anxiety   . Depression   . Diabetes mellitus type 2, controlled (HCC) 05/20/2014  . GERD (gastroesophageal reflux disease)    heartburn  . Hyperlipidemia   . Hypertension   . Neuropathy (HCC)    post back surgery  . Pneumonia   . Right knee DJD 01/27/2013  . Substance abuse    Alcohol - sober since 2006    Past Surgical History:  Procedure Laterality Date  . ABDOMINAL HYSTERECTOMY    . AMPUTATION Right 10/03/2015   Procedure: Right Foot 3rd Ray Amputation;  Surgeon: Nadara Mustard, MD;  Location: [facility] OR;  Service:  Orthopedics;  Laterality: Right;  . BACK SURGERY  2009, July and Sept   L4, L5 Fusion,  second surgery L2-L% and S1  . BUNIONECTOMY    . JOINT REPLACEMENT    . SPINE SURGERY    . TOTAL KNEE ARTHROPLASTY Right 01/26/2013   Procedure: TOTAL KNEE ARTHROPLASTY;  Surgeon: Thera Flake., MD;  Location: [facility] OR;  Service: Orthopedics;  Laterality: Right;    Social History  Substance Use Topics  . Smoking status: Former Smoker    Packs/day: 0.50    Years: 10.00    Types: Cigarettes    Quit date: 03/10/2016  . Smokeless tobacco: Never Used  . Alcohol use 0.0 oz/week     Comment: consumed ETOH previous couple days.Pt is a recovering alcoholic and has been sober since ~2006.    Family History  Problem Relation Age of Onset  . Thyroid disease Mother   . Hypertension Father   . Anxiety disorder Sister   . Cancer Paternal Grandmother     prostate    Allergies  Allergen Reactions  . Celebrex [Celecoxib] Swelling    UNSPECIFIED AREA  . Wellbutrin [Bupropion] Rash    Medication list has been reviewed and updated.  Current Outpatient Prescriptions on File Prior to Visit  Medication Sig Dispense Refill  . acyclovir (ZOVIRAX) 200 MG capsule  TAKE 2 CAPSULES BY MOUTH THREE TIMES A DAY FOR 5 DAYS AS NEEDED FOR OUTBREAK 60 capsule 0  . DULoxetine (CYMBALTA) 60 MG capsule Take 1 capsule (60 mg total) by mouth 2 (two) times daily. 180 capsule 1  . gabapentin (NEURONTIN) 800 MG tablet Take 1 tablet (800 mg total) by mouth 4 (four) times daily. 480 tablet 1  . ibuprofen (ADVIL,MOTRIN) 200 MG tablet Take 800 mg by mouth 2 (two) times daily as needed for mild pain.     Marland Kitchen lisinopril-hydrochlorothiazide (PRINZIDE,ZESTORETIC) 20-25 MG tablet Take 1 tablet by mouth daily. 90 tablet 1  . metFORMIN (GLUCOPHAGE) 500 MG tablet Take 1 tablet (500 mg total) by mouth at bedtime. 90 tablet 1  . Multiple Vitamin (MULTIVITAMIN) tablet Take 1 tablet by mouth daily.    . traZODone (DESYREL) 100 MG tablet Take 3  tablets (300 mg total) by mouth at bedtime. 270 tablet 1  . varenicline (CHANTIX) 0.5 MG tablet Take 1 tablet (0.5 mg total) by mouth 2 (two) times daily. Start 1 tablet daily for 3 days, then take 1 tablet twice per day for four days.  Maximum dose of 2mg  per day 60 tablet 5   No current facility-administered medications on file prior to visit.     ROS ROS otherwise unremarkable unless listed above.   Physical Examination: BP 104/70   Pulse 90   Temp 98.6 F (37 C) (Oral)   Resp 18   Ht 5' (1.524 m)   Wt 170 lb 4 oz (77.2 kg)   SpO2 99%   BMI 33.25 kg/m  Ideal Body Weight: Weight in (lb) to have BMI = 25: 127.7  Physical Exam  Constitutional: She is oriented to person, place, and time. She appears well-developed and well-nourished. No distress.  HENT:  Head: Normocephalic and atraumatic.  Right Ear: External ear and ear canal normal. There is tenderness. Tympanic membrane is injected and bulging. A middle ear effusion is present.  Left Ear: Tympanic membrane, external ear and ear canal normal.  Nose: Mucosal edema and rhinorrhea present. Right sinus exhibits maxillary sinus tenderness. Right sinus exhibits no frontal sinus tenderness. Left sinus exhibits no maxillary sinus tenderness and no frontal sinus tenderness.  Mouth/Throat: No uvula swelling. No oropharyngeal exudate, posterior oropharyngeal edema or posterior oropharyngeal erythema.  Eyes: Conjunctivae and EOM are normal. Pupils are equal, round, and reactive to light.  Cardiovascular: Normal rate and regular rhythm.  Exam reveals no gallop, no distant heart sounds and no friction rub.   No murmur heard. Pulmonary/Chest: Effort normal. No respiratory distress. She has no decreased breath sounds. She has no wheezes. She has no rhonchi.  Lymphadenopathy:       Head (right side): No submandibular, no tonsillar, no preauricular and no posterior auricular adenopathy present.       Head (left side): No submandibular, no  tonsillar, no preauricular and no posterior auricular adenopathy present.  Neurological: She is alert and oriented to person, place, and time.  Skin: She is not diaphoretic.  Psychiatric: She has a normal mood and affect. Her behavior is normal.   Results for orders placed or performed in visit on 05/22/16  POCT CBC  Result Value Ref Range   WBC 14.7 (A) 4.6 - 10.2 K/uL   Lymph, poc 2.6 0.6 - 3.4   POC LYMPH PERCENT 17.7 10 - 50 %L   MID (cbc) 0.2 0 - 0.9   POC MID % 1.6 0 - 12 %M   POC Granulocyte 11.9 (  A) 2 - 6.9   Granulocyte percent 80.7 (A) 37 - 80 %G   RBC 4.01 (A) 4.04 - 5.48 M/uL   Hemoglobin 13.0 12.2 - 16.2 g/dL   HCT, POC 16.1 09.6 - 47.9 %   MCV 94.3 80 - 97 fL   MCH, POC 32.4 (A) 27 - 31.2 pg   MCHC 34.3 31.8 - 35.4 g/dL   RDW, POC 04.5 %   Platelet Count, POC 356 142 - 424 K/uL   MPV 8.2 0 - 99.8 fL   Dg Chest 2 View  Result Date: 05/22/2016 CLINICAL DATA:  productive cough EXAM: CHEST  2 VIEW COMPARISON:  01/04/2013 FINDINGS: Moderate thoracic spondylosis. Midline trachea. Normal heart size and mediastinal contours. No pleural effusion or pneumothorax. Relatively well-circumscribed left upper lobe nodule measures 1.5 cm. IMPRESSION: No acute cardiopulmonary disease. 1.5 cm left upper lobe pulmonary nodule. Although this may represent a benign entity, given its well-circumscribed appearance, further evaluation with chest CT is recommended. These results will be called to the ordering clinician or representative by the Radiologist Assistant, and communication documented in the PACS or zVision Dashboard. Electronically Signed   By: Jeronimo Greaves M.D.   On: 05/22/2016 15:04    Assessment and Plan: Brittney Sawyer is a 51 y.o. female who is here today for cc of ear pain, sinus pressure, cough, and sore throat. Given azithromycin Advised mucinex Follow up Monday if no improvement. Will order ct for pulmonary nodule.  Productive cough - Plan: POCT CBC, DG Chest 2 View,  Guaifenesin (MUCINEX MAXIMUM STRENGTH) 1200 MG TB12, chlorpheniramine-HYDROcodone (TUSSIONEX PENNKINETIC ER) 10-8 MG/5ML SUER  SOB (shortness of breath) - Plan: POCT CBC, DG Chest 2 View, Guaifenesin (MUCINEX MAXIMUM STRENGTH) 1200 MG TB12, chlorpheniramine-HYDROcodone (TUSSIONEX PENNKINETIC ER) 10-8 MG/5ML SUER  Dizziness - Plan: POCT CBC, DG Chest 2 View, Guaifenesin (MUCINEX MAXIMUM STRENGTH) 1200 MG TB12, chlorpheniramine-HYDROcodone (TUSSIONEX PENNKINETIC ER) 10-8 MG/5ML SUER  Dysfunction of left eustachian tube - Plan: Guaifenesin (MUCINEX MAXIMUM STRENGTH) 1200 MG TB12, chlorpheniramine-HYDROcodone (TUSSIONEX PENNKINETIC ER) 10-8 MG/5ML SUER  Acute mucoid otitis media of left ear - Plan: azithromycin (ZITHROMAX) 250 MG tablet  Trena Platt, PA-C [facility] 05/22/2016 2:01 PM

## 2016-05-28 ENCOUNTER — Other Ambulatory Visit: Payer: Self-pay | Admitting: Physician Assistant

## 2016-05-28 ENCOUNTER — Telehealth: Payer: Self-pay

## 2016-05-28 DIAGNOSIS — R911 Solitary pulmonary nodule: Secondary | ICD-10-CM

## 2016-05-28 NOTE — Telephone Encounter (Signed)
ordered

## 2016-05-28 NOTE — Telephone Encounter (Signed)
Pt called inquiring about her CT Scan. States that her x-ray from her last visit was abnormal and she is supposed to be getting a CT scan to determine further. No orders have been placed, so I let her know that I would contact Judeth Cornfield English and see what needs to happen next.

## 2016-05-31 ENCOUNTER — Telehealth: Payer: Self-pay

## 2016-05-31 NOTE — Telephone Encounter (Signed)
CT has been approved by insurance and the order has been sent to [facility] for scheduling.

## 2016-05-31 NOTE — Telephone Encounter (Signed)
Patient called and stated that she can still not hear out of her right ear and she is starting to get pain in her left out, and she is finished with her antibiotics.   She is also waiting to hear about a referral for a CT of her lungs.  Her call back number is [phone]

## 2016-06-08 ENCOUNTER — Ambulatory Visit
Admission: RE | Admit: 2016-06-08 | Discharge: 2016-06-08 | Disposition: A | Discharge status: Home / Self Care | Payer: BLUE CROSS/BLUE SHIELD | Source: Ambulatory Visit | Attending: Physician Assistant | Admitting: Physician Assistant

## 2016-06-08 ENCOUNTER — Other Ambulatory Visit: Payer: Self-pay | Admitting: Physician Assistant

## 2016-06-08 DIAGNOSIS — E079 Disorder of thyroid, unspecified: Secondary | ICD-10-CM

## 2016-06-08 DIAGNOSIS — R911 Solitary pulmonary nodule: Secondary | ICD-10-CM

## 2016-06-08 DIAGNOSIS — E0789 Other specified disorders of thyroid: Secondary | ICD-10-CM

## 2016-06-14 ENCOUNTER — Other Ambulatory Visit: Payer: BLUE CROSS/BLUE SHIELD

## 2016-06-21 ENCOUNTER — Other Ambulatory Visit: Payer: BLUE CROSS/BLUE SHIELD

## 2016-06-25 ENCOUNTER — Telehealth: Payer: Self-pay

## 2016-06-25 NOTE — Telephone Encounter (Signed)
Pt request a paper copy of her notes from the CT imaging, she had done on 06/08/16.   She would like the copy mailed to the address on file.

## 2016-06-26 NOTE — Telephone Encounter (Signed)
mailed

## 2016-06-30 ENCOUNTER — Other Ambulatory Visit: Payer: BLUE CROSS/BLUE SHIELD

## 2016-07-02 ENCOUNTER — Other Ambulatory Visit: Payer: BLUE CROSS/BLUE SHIELD

## 2016-07-09 ENCOUNTER — Other Ambulatory Visit: Payer: BLUE CROSS/BLUE SHIELD

## 2016-08-26 ENCOUNTER — Ambulatory Visit (INDEPENDENT_AMBULATORY_CARE_PROVIDER_SITE_OTHER): Payer: BLUE CROSS/BLUE SHIELD | Admitting: Family Medicine

## 2016-08-26 ENCOUNTER — Encounter: Payer: Self-pay | Admitting: Family Medicine

## 2016-08-26 VITALS — BP 103/70 | HR 100 | Temp 98.0°F | Ht 61.81 in | Wt 175.2 lb

## 2016-08-26 DIAGNOSIS — Q859 Phakomatosis, unspecified: Secondary | ICD-10-CM | POA: Diagnosis not present

## 2016-08-26 DIAGNOSIS — E041 Nontoxic single thyroid nodule: Secondary | ICD-10-CM | POA: Diagnosis not present

## 2016-08-26 DIAGNOSIS — M7541 Impingement syndrome of right shoulder: Secondary | ICD-10-CM | POA: Diagnosis not present

## 2016-08-26 DIAGNOSIS — Z720 Tobacco use: Secondary | ICD-10-CM | POA: Diagnosis not present

## 2016-08-26 DIAGNOSIS — Z5181 Encounter for therapeutic drug level monitoring: Secondary | ICD-10-CM

## 2016-08-26 DIAGNOSIS — Z87891 Personal history of nicotine dependence: Secondary | ICD-10-CM | POA: Diagnosis not present

## 2016-08-26 MED ORDER — ACETAMINOPHEN 500 MG PO TABS: 1000.0000 mg | ORAL_TABLET | Freq: Three times a day (TID) | ORAL | 0 refills | Status: AC | PRN

## 2016-08-26 MED ORDER — CYCLOBENZAPRINE HCL 10 MG PO TABS: 10.0000 mg | ORAL_TABLET | Freq: Every day | ORAL | 1 refills | Status: DC

## 2016-08-26 MED ORDER — IBUPROFEN 800 MG PO TABS: 800.0000 mg | ORAL_TABLET | Freq: Three times a day (TID) | ORAL | 0 refills | Status: DC | PRN

## 2016-08-26 NOTE — Progress Notes (Signed)
Subjective:    Patient ID: Brittney Sawyer, female    DOB: 08/21/1965, 51 y.o.   MRN: [id number] Chief Complaint  Patient presents with  . Follow-up    X-ray/lab consult  . Shoulder Pain    X 3 weeks    HPI  Brittney Sawyer is a 51 year old woman here to follow-up on her chronic medical conditions.  I saw pt once about 3.5 yrs prior, and her PCP is [place], PA-C - colleague here at PCP.  HTN: On lisinopril HCTZ 20-25 daily  DMII: On immediate release metformin 500 mg daily at bedtime??? Last a1c 6 mos prior 4.9  Neuropathy: On Cymbalta 60 mg twice a day, Neurontin 800 mg 4 times a day, and trazodone 300 mg daily at bedtime  Tobacco abuse: Did trial of Chantix 6 months prior.  Quit smoking with that and is now weaning off of chantix down to 1/2 tab bid!!!!!  Mood d/o and Insomnia: stable on current regimen.  Is here to discuss follow-up on an abnml chest CT done 06/08/2016 (result copied below). She was told to have repeat imaing in 3 mos which is what she is here for today.  The CT was done after a CXR in office on 05/22/16 done for a cough in a smoker shows a 1.5 cm LUL pulm nodule. Well-circumscribed rounded partially calcified lesion in the left upper lobe as described. This likely represents a hamartoma. Consider one of the following in 3 months for both low-risk and high-risk individuals: (a) repeat chest CT, (b) follow-up PET-CT, or (c) tissue sampling. This recommendation follows the consensus statement: Guidelines for Management of Incidental Pulmonary Nodules Detected on CT Images: From the Fleischner Society 2017; Radiology 2017; 284:228-243.  Has increased exercise and weight training and 3 wks ago she developed severe right shoulder pain.  She can't abduct arm and pain has steadily worsened, esp worse at night. Can't lift anyhting.  Radiates down outside of arm, no neck issues. No numbness, tingling, weakness, hand/elbow ok.  Weight and flexion make pain the worst. No prior h/o  injuries. Has been taking ibuprofen 400-800mg  bid - helps some, does not bother stomach.  Past Medical History:  Diagnosis Date  . Anxiety   . Depression   . Diabetes mellitus type 2, controlled (HCC) 05/20/2014  . GERD (gastroesophageal reflux disease)    heartburn  . Hyperlipidemia   . Hypertension   . Neuropathy    post back surgery  . Pneumonia   . Right knee DJD 01/27/2013  . Substance abuse    Alcohol - sober since 2006   Past Surgical History:  Procedure Laterality Date  . ABDOMINAL HYSTERECTOMY    . AMPUTATION Right 10/03/2015   Procedure: Right Foot 3rd Ray Amputation;  Surgeon: Nadara Mustard, MD;  Location: [facility] OR;  Service: Orthopedics;  Laterality: Right;  . BACK SURGERY  2009, July and Sept   L4, L5 Fusion,  second surgery L2-L% and S1  . BUNIONECTOMY    . JOINT REPLACEMENT    . SPINE SURGERY    . TOTAL KNEE ARTHROPLASTY Right 01/26/2013   Procedure: TOTAL KNEE ARTHROPLASTY;  Surgeon: Thera Flake., MD;  Location: [facility] OR;  Service: Orthopedics;  Laterality: Right;   Current Outpatient Prescriptions on File Prior to Visit  Medication Sig Dispense Refill  . acyclovir (ZOVIRAX) 200 MG capsule TAKE 2 CAPSULES BY MOUTH THREE TIMES A DAY FOR 5 DAYS AS NEEDED FOR OUTBREAK 60 capsule 0  . DULoxetine (CYMBALTA) 60 MG  capsule Take 1 capsule (60 mg total) by mouth 2 (two) times daily. 180 capsule 1  . gabapentin (NEURONTIN) 800 MG tablet Take 1 tablet (800 mg total) by mouth 4 (four) times daily. 480 tablet 1  . lisinopril-hydrochlorothiazide (PRINZIDE,ZESTORETIC) 20-25 MG tablet Take 1 tablet by mouth daily. 90 tablet 1  . metFORMIN (GLUCOPHAGE) 500 MG tablet Take 1 tablet (500 mg total) by mouth at bedtime. 90 tablet 1  . Multiple Vitamin (MULTIVITAMIN) tablet Take 1 tablet by mouth daily.    . traZODone (DESYREL) 100 MG tablet Take 3 tablets (300 mg total) by mouth at bedtime. 270 tablet 1  . varenicline (CHANTIX) 0.5 MG tablet Take 1 tablet (0.5 mg total) by mouth 2 (two)  times daily. Start 1 tablet daily for 3 days, then take 1 tablet twice per day for four days.  Maximum dose of 2mg  per day 60 tablet 5   No current facility-administered medications on file prior to visit.    Allergies  Allergen Reactions  . Celebrex [Celecoxib] Swelling    UNSPECIFIED AREA  . Wellbutrin [Bupropion] Rash   Family History  Problem Relation Age of Onset  . Thyroid disease Mother   . Hypertension Father   . Anxiety disorder Sister   . Cancer Paternal Grandmother     prostate   Social History   Social History  . Marital status: Married    Spouse name: N/A  . Number of children: N/A  . Years of education: N/A   Social History Main Topics  . Smoking status: Former Smoker    Packs/day: 0.50    Years: 10.00    Types: Cigarettes    Quit date: 03/10/2016  . Smokeless tobacco: Never Used  . Alcohol use No     Comment: Pt is a recovering alcoholic and has been sober since ~2006.  . Drug use: No     Comment: smoke marijuana in AUgust- x 1 2016  . Sexual activity: Yes    Birth control/ protection: Surgical   Other Topics Concern  . None   Social History Narrative  . None   Depression screen [facility] 2/9 08/26/2016 05/22/2016 03/10/2016 09/25/2015 03/19/2015  Decreased Interest 0 0 0 0 0  Down, Depressed, Hopeless 1 0 0 0 0  PHQ - 2 Score 1 0 0 0 0    Review of Systems See hpi    Objective:   Physical Exam  Constitutional: She is oriented to person, place, and time. She appears well-developed and well-nourished. No distress.  HENT:  Head: Normocephalic and atraumatic.  Right Ear: External ear normal.  Left Ear: External ear normal.  Eyes: Conjunctivae are normal. No scleral icterus.  Neck: Normal range of motion. Neck supple. No thyromegaly present.  Cardiovascular: Normal rate, regular rhythm, normal heart sounds and intact distal pulses.   Pulmonary/Chest: Effort normal and breath sounds normal. No respiratory distress.  Musculoskeletal: She exhibits no  edema.       Right shoulder: She exhibits pain and decreased strength. She exhibits normal range of motion, no tenderness, no bony tenderness, no swelling, no effusion and no crepitus.       Left shoulder: She exhibits normal range of motion, no tenderness, no bony tenderness, no swelling, no effusion, no crepitus, no pain, no spasm and normal strength.  Sig pain with resisted external rotation > resisten internal rotation. + Neers, Neg Hawkins. Normal empty can test  Lymphadenopathy:    She has no cervical adenopathy.  Neurological: She is alert  and oriented to person, place, and time.  Skin: Skin is warm and dry. She is not diaphoretic. No erythema.  Psychiatric: She has a normal mood and affect. Her behavior is normal.    Pain worse tieh resisted ext rotatyion > interal     BP 103/70 (BP Location: Right Arm, Patient Position: Sitting, Cuff Size: Small)   Pulse 100   Temp 98 F (36.7 C) (Oral)   Ht 5' 1.81" (1.57 m)   Wt 175 lb 3.2 oz (79.5 kg)   SpO2 100%   BMI 32.24 kg/m   Assessment & Plan:  Needs fasting lipid panel. Last check 18 months prior with triglycerides 438 so unable to calculate LDL. Non-HDL was 177. a1c microalb cmp, due for flp w direct ldl  1. Hamartoma of left lung (HCC)   2. Medication monitoring encounter   3. Thyroid nodule - incidentally seen on CT   4. History of tobacco use - quit 5 mos prior, weaning self off of chantix now. Doing great  5. Impingement syndrome of right shoulder - ice, home exercises.  6. Tobacco user     Orders Placed This Encounter  Procedures  . CT Chest Wo Contrast    EPIC ORDER/ [id number]/NO NEEDS/INS-BCBS/CLC/PT    Standing Status:   Future    Standing Expiration Date:   10/26/2017    Scheduling Instructions:     Please schedule for no sooner/earlier than 09/07/2016    Order Specific Question:   Reason for Exam (SYMPTOM  OR DIAGNOSIS REQUIRED)    Answer:   3 mo f/u of suspected hamartoma in the left upper lobe seen on CT  06/08/16    Order Specific Question:   Is patient pregnant?    Answer:   Yes    Order Specific Question:   Preferred imaging location?    Answer:   [facility]    Order Specific Question:   Radiology Contrast Protocol - do NOT remove file path    Answer:   [email or web address].[email or web address]  . Thyroid Panel With TSH  . Comprehensive metabolic panel  . Comprehensive metabolic panel    Meds ordered this encounter  Medications  . ibuprofen (ADVIL,MOTRIN) 800 MG tablet    Sig: Take 1 tablet (800 mg total) by mouth every 8 (eight) hours as needed.    Dispense:  90 tablet    Refill:  0  . acetaminophen (TYLENOL) 500 MG tablet    Sig: Take 2 tablets (1,000 mg total) by mouth every 8 (eight) hours as needed.    Dispense:  30 tablet    Refill:  0  . cyclobenzaprine (FLEXERIL) 10 MG tablet    Sig: Take 1 tablet (10 mg total) by mouth at bedtime.    Dispense:  30 tablet    Refill:  1     Norberto Sorenson, M.D.  Primary Care at [facility] [address] [place], [place] [address] [phone] phone [phone] fax  08/31/16 2:41 PM

## 2016-08-26 NOTE — Patient Instructions (Addendum)
IF you received an x-ray today, you will receive an invoice from [facility] Radiology. Please contact [facility] Radiology at [phone] with questions or concerns regarding your invoice.   IF you received labwork today, you will receive an invoice from [facility]. Please contact [facility] at [phone] with questions or concerns regarding your invoice.   Our billing staff will not be able to assist you with questions regarding bills from these companies.  You will be contacted with the lab results as soon as they are available. The fastest way to get your results is to activate your My Chart account. Instructions are located on the last page of this paperwork. If you have not heard from [place] regarding the results in 2 weeks, please contact this office.     Shoulder Impingement Syndrome Shoulder impingement syndrome is a condition that causes pain when connective tissues (tendons) surrounding the shoulder joint become pinched. These tendons are part of the group of muscles and tissues that help to stabilize the shoulder (rotator cuff). Beneath the rotator cuff is a fluid-filled sac (bursa) that allows the muscles and tendons to glide smoothly. The bursa may become swollen or irritated (bursitis). Bursitis, swelling in the rotator cuff tendons, or both conditions can decrease how much space is under a bone in the shoulder joint (acromion), resulting in impingement. What are the causes? Shoulder impingement syndrome can be caused by bursitis or swelling of the rotator cuff tendons, which may result from:  Repetitive overhead arm movements.  Falling onto the shoulder.  Weakness in the shoulder muscles. What increases the risk? You may be more likely to develop this condition if you are an athlete who participates in:  Sports that involve throwing, such as baseball.  Tennis.  Swimming.  Volleyball. Some people are also more likely to develop impingement syndrome because of the shape  of their acromion bone. What are the signs or symptoms? The main symptom of this condition is pain on the front or side of the shoulder. Pain may:  Get worse when lifting or raising the arm.  Get worse at night.  Wake you up from sleeping.  Feel sharp when the shoulder is moved, and then fade to an ache. Other signs and symptoms may include:  Tenderness.  Stiffness.  Inability to raise the arm above shoulder level or behind the body.  Weakness. How is this diagnosed? This condition may be diagnosed based on:  Your symptoms.  Your medical history.  A physical exam.  Imaging tests, such as:  X-rays.  MRI.  Ultrasound. How is this treated? Treatment for this condition may include:  Resting your shoulder and avoiding all activities that cause pain or put stress on the shoulder.  Icing your shoulder.  NSAIDs to help reduce pain and swelling.  One or more injections of medicines to numb the area and reduce inflammation.  Physical therapy.  Surgery. This may be needed if nonsurgical treatments have not helped. Surgery may involve repairing the rotator cuff, reshaping the acromion, or removing the bursa. Follow these instructions at home: Managing pain, stiffness, and swelling   If directed, apply ice to the injured area.  Put ice in a plastic bag.  Place a towel between your skin and the bag.  Leave the ice on for 20 minutes, 2-3 times a day. Activity   Rest and return to your normal activities as told by your health care provider. Ask your health care provider what activities are safe for you.  Do exercises  as told by your health care provider. General instructions   Do not use any tobacco products, including cigarettes, chewing tobacco, or e-cigarettes. Tobacco can delay healing. If you need help quitting, ask your health care provider.  Ask your health care provider when it is safe for you to drive.  Take over-the-counter and prescription medicines  only as told by your health care provider.  Keep all follow-up visits as told by your health care provider. This is important. How is this prevented?  Give your body time to rest between periods of activity.  Be safe and responsible while being active to avoid falls.  Maintain physical fitness, including strength and flexibility. Contact a health care provider if:  Your symptoms have not improved after 1-2 months of treatment and rest.  You cannot lift your arm away from your body. This information is not intended to replace advice given to you by your health care provider. Make sure you discuss any questions you have with your health care provider. Document Released: 04/26/2005 Document Revised: 01/01/2016 Document Reviewed: 03/29/2015 Elsevier Interactive Patient Education  2017 [employer] Inc[employer]   Shoulder Impingement Syndrome Rehab Ask your health care provider which exercises are safe for you. Do exercises exactly as told by your health care provider and adjust them as directed. It is normal to feel mild stretching, pulling, tightness, or discomfort as you do these exercises, but you should stop right away if you feel sudden pain or your pain gets worse.Do not begin these exercises until told by your health care provider. Stretching and range of motion exercise This exercise warms up your muscles and joints and improves the movement and flexibility of your shoulder. This exercise also helps to relieve pain and stiffness. Exercise A: Passive horizontal adduction   1. Sit or stand and pull your left / right elbow across your chest, toward your other shoulder. Stop when you feel a gentle stretch in the back of your shoulder and upper arm.  Keep your arm at shoulder height.  Keep your arm as close to your body as you comfortably can. 2. Hold for __________ seconds. 3. Slowly return to the starting position. Repeat __________ times. Complete this exercise __________ times a  day. Strengthening exercises These exercises build strength and endurance in your shoulder. Endurance is the ability to use your muscles for a long time, even after they get tired. Exercise B: External rotation, isometric  1. Stand or sit in a doorway, facing the door frame. 2. Bend your left / right elbow and place the back of your wrist against the door frame. Only your wrist should be touching the frame. Keep your upper arm at your side. 3. Gently press your wrist against the door frame, as if you are trying to push your arm away from your abdomen.  Avoid shrugging your shoulder while you press your hand against the door frame. Keep your shoulder blade tucked down toward the middle of your back. 4. Hold for __________ seconds. 5. Slowly release the tension, and relax your muscles completely before you do the exercise again. Repeat __________ times. Complete this exercise __________ times a day. Exercise C: Internal rotation, isometric   1. Stand or sit in a doorway, facing the door frame. 2. Bend your left / right elbow and place the inside of your wrist against the door frame. Only your wrist should be touching the frame. Keep your upper arm at your side. 3. Gently press your wrist against the door frame, as if  you are trying to push your arm toward your abdomen.  Avoid shrugging your shoulder while you press your hand against the door frame. Keep your shoulder blade tucked down toward the middle of your back. 4. Hold for __________ seconds. 5. Slowly release the tension, and relax your muscles completely before you do the exercise again. Repeat __________ times. Complete this exercise __________ times a day. Exercise D: Scapular protraction, supine   1. Lie on your back on a firm surface. Hold a __________ weight in your left / right hand. 2. Raise your left / right arm straight into the air so your hand is directly above your shoulder joint. 3. Push the weight into the air so your  shoulder lifts off of the surface that you are lying on. Do not move your head, neck, or back. 4. Hold for __________ seconds. 5. Slowly return to the starting position. Let your muscles relax completely before you repeat this exercise. Repeat __________ times. Complete this exercise __________ times a day. Exercise E: Scapular retraction   1. Sit in a stable chair without armrests, or stand. 2. Secure an exercise band to a stable object in front of you so the band is at shoulder height. 3. Hold one end of the exercise band in each hand. Your palms should face down. 4. Squeeze your shoulder blades together and move your elbows slightly behind you. Do not shrug your shoulders while you do this. 5. Hold for __________ seconds. 6. Slowly return to the starting position. Repeat __________ times. Complete this exercise __________ times a day. Exercise F: Shoulder extension   1. Sit in a stable chair without armrests, or stand. 2. Secure an exercise band to a stable object in front of you where the band is above shoulder height. 3. Hold one end of the exercise band in each hand. 4. Straighten your elbows and lift your hands up to shoulder height. 5. Squeeze your shoulder blades together and pull your hands down to the sides of your thighs. Stop when your hands are straight down by your sides. Do not let your hands go behind your body. 6. Hold for __________ seconds. 7. Slowly return to the starting position. Repeat __________ times. Complete this exercise __________ times a day. This information is not intended to replace advice given to you by your health care provider. Make sure you discuss any questions you have with your health care provider. Document Released: 04/26/2005 Document Revised: 01/01/2016 Document Reviewed: 03/29/2015 Elsevier Interactive Patient Education  2017 [employer].

## 2016-08-27 LAB — THYROID PANEL WITH TSH
FREE THYROXINE INDEX: 1.7 (ref 1.2–4.9)
T3 Uptake Ratio: 23 % — ABNORMAL LOW (ref 24–39)
T4, Total: 7.4 ug/dL (ref 4.5–12.0)
TSH: 2.29 u[IU]/mL (ref 0.450–4.500)

## 2016-08-27 LAB — COMPREHENSIVE METABOLIC PANEL
ALBUMIN: 4.9 g/dL (ref 3.5–5.5)
ALT: 20 IU/L (ref 0–32)
AST: 16 IU/L (ref 0–40)
Albumin/Globulin Ratio: 2.1 (ref 1.2–2.2)
Alkaline Phosphatase: 80 IU/L (ref 39–117)
BUN / CREAT RATIO: 29 — AB (ref 9–23)
BUN: 27 mg/dL — AB (ref 6–24)
Bilirubin Total: <0.2 mg/dL (ref 0.0–1.2)
CO2: 24 mmol/L (ref 18–29)
CREATININE: 0.92 mg/dL (ref 0.57–1.00)
Calcium: 9.9 mg/dL (ref 8.7–10.2)
Chloride: 97 mmol/L (ref 96–106)
GFR calc non Af Amer: 72 mL/min/{1.73_m2} (ref >59)
GFR, EST AFRICAN AMERICAN: 83 mL/min/{1.73_m2} (ref >59)
Globulin, Total: 2.3 g/dL (ref 1.5–4.5)
Glucose: 83 mg/dL (ref 65–99)
Potassium: 5.4 mmol/L — ABNORMAL HIGH (ref 3.5–5.2)
Sodium: 139 mmol/L (ref 134–144)
TOTAL PROTEIN: 7.2 g/dL (ref 6.0–8.5)

## 2016-08-31 ENCOUNTER — Encounter: Payer: Self-pay | Admitting: Family Medicine

## 2016-08-31 DIAGNOSIS — Z87891 Personal history of nicotine dependence: Secondary | ICD-10-CM | POA: Insufficient documentation

## 2016-09-03 ENCOUNTER — Other Ambulatory Visit: Payer: Self-pay | Admitting: Physician Assistant

## 2016-09-03 DIAGNOSIS — E119 Type 2 diabetes mellitus without complications: Secondary | ICD-10-CM

## 2016-09-03 DIAGNOSIS — I10 Essential (primary) hypertension: Secondary | ICD-10-CM

## 2016-09-06 ENCOUNTER — Other Ambulatory Visit: Payer: BLUE CROSS/BLUE SHIELD

## 2016-09-06 ENCOUNTER — Ambulatory Visit
Admission: RE | Admit: 2016-09-06 | Discharge: 2016-09-06 | Disposition: A | Discharge status: Home / Self Care | Payer: BLUE CROSS/BLUE SHIELD | Source: Ambulatory Visit | Attending: Family Medicine | Admitting: Family Medicine

## 2016-09-06 DIAGNOSIS — Z87891 Personal history of nicotine dependence: Secondary | ICD-10-CM

## 2016-09-06 DIAGNOSIS — E041 Nontoxic single thyroid nodule: Secondary | ICD-10-CM

## 2016-09-06 DIAGNOSIS — Q859 Phakomatosis, unspecified: Secondary | ICD-10-CM

## 2016-09-20 ENCOUNTER — Ambulatory Visit: Payer: BLUE CROSS/BLUE SHIELD | Admitting: Family Medicine

## 2016-09-29 ENCOUNTER — Other Ambulatory Visit: Payer: Self-pay | Admitting: Physician Assistant

## 2016-09-29 DIAGNOSIS — F329 Major depressive disorder, single episode, unspecified: Secondary | ICD-10-CM

## 2016-09-29 DIAGNOSIS — G4700 Insomnia, unspecified: Secondary | ICD-10-CM

## 2016-09-29 DIAGNOSIS — F32A Depression, unspecified: Secondary | ICD-10-CM

## 2016-09-29 DIAGNOSIS — G894 Chronic pain syndrome: Secondary | ICD-10-CM

## 2016-10-15 ENCOUNTER — Telehealth: Payer: Self-pay | Admitting: Physician Assistant

## 2016-10-15 NOTE — Telephone Encounter (Signed)
PATIENT WOULD LIKE Brittney Sawyer TO KNOW THAT THE GABAPENTIN 800 MG HAS GONE UP TO A $63.00 CO-PAY. THE PHARMACIST TOLD HER THAT THE GENERIC (CARBAMAZEPINE) WILL BE COVERED BY HER BCBS AT 100%. SHE WOULD LIKE TO GET IT CHANGED. SHE SAID SHE ONLY HAS A FEW PILLS LEFT. BEST PHONE [phone] (CELL) PHARMACY CHOICE IS [facility] [address]. MBC

## 2016-10-15 NOTE — Telephone Encounter (Signed)
Carbamazepine is not something we generally use to treat neuropathic pain.  There are a host of side effects to this medicine. We can send for 300mg  gabapentin, taking 3 (900mg ), 4 times per day, or 300mg  take 3 (900mg )  four times per day, three times per day--and see if this is a cheaper option?

## 2016-10-16 NOTE — Telephone Encounter (Signed)
Spoke with patient she is going to stick with original prescription.

## 2016-11-11 ENCOUNTER — Other Ambulatory Visit: Payer: Self-pay | Admitting: Physician Assistant

## 2016-12-30 ENCOUNTER — Other Ambulatory Visit: Payer: Self-pay | Admitting: Physician Assistant

## 2016-12-30 DIAGNOSIS — G894 Chronic pain syndrome: Secondary | ICD-10-CM

## 2016-12-30 DIAGNOSIS — G4700 Insomnia, unspecified: Secondary | ICD-10-CM

## 2016-12-30 DIAGNOSIS — F32A Depression, unspecified: Secondary | ICD-10-CM

## 2016-12-30 DIAGNOSIS — F329 Major depressive disorder, single episode, unspecified: Secondary | ICD-10-CM

## 2017-01-08 HISTORY — PX: SHOULDER ARTHROSCOPY W/ ROTATOR CUFF REPAIR: SHX2400

## 2017-01-10 ENCOUNTER — Other Ambulatory Visit: Payer: Self-pay | Admitting: Physician Assistant

## 2017-01-10 DIAGNOSIS — F32A Depression, unspecified: Secondary | ICD-10-CM

## 2017-01-10 DIAGNOSIS — G4700 Insomnia, unspecified: Secondary | ICD-10-CM

## 2017-01-10 DIAGNOSIS — G894 Chronic pain syndrome: Secondary | ICD-10-CM

## 2017-01-10 DIAGNOSIS — F329 Major depressive disorder, single episode, unspecified: Secondary | ICD-10-CM

## 2017-01-20 ENCOUNTER — Other Ambulatory Visit: Payer: Self-pay

## 2017-01-20 ENCOUNTER — Other Ambulatory Visit: Payer: Self-pay | Admitting: Physician Assistant

## 2017-01-20 DIAGNOSIS — F32A Depression, unspecified: Secondary | ICD-10-CM

## 2017-01-20 DIAGNOSIS — G894 Chronic pain syndrome: Secondary | ICD-10-CM

## 2017-01-20 DIAGNOSIS — G4700 Insomnia, unspecified: Secondary | ICD-10-CM

## 2017-01-20 DIAGNOSIS — F329 Major depressive disorder, single episode, unspecified: Secondary | ICD-10-CM

## 2017-01-20 MED ORDER — TRAZODONE HCL 100 MG PO TABS: 100.0000 mg | ORAL_TABLET | Freq: Three times a day (TID) | ORAL | 0 refills | Status: DC

## 2017-01-20 NOTE — Telephone Encounter (Signed)
Call --- Trazodone approved for one month only as pt will be due for six month follow-up in October 2018. Please schedule OV with PA English for follow-up.

## 2017-01-20 NOTE — Telephone Encounter (Signed)
mychart message sent to pt about making an apt for more refills °

## 2017-01-30 ENCOUNTER — Other Ambulatory Visit: Payer: Self-pay | Admitting: Physician Assistant

## 2017-01-30 DIAGNOSIS — G894 Chronic pain syndrome: Secondary | ICD-10-CM

## 2017-02-01 NOTE — Telephone Encounter (Signed)
Mychart message sent to patient about making an apt for more refills

## 2017-02-15 ENCOUNTER — Other Ambulatory Visit: Payer: Self-pay | Admitting: Family Medicine

## 2017-02-15 DIAGNOSIS — G4700 Insomnia, unspecified: Secondary | ICD-10-CM

## 2017-02-15 DIAGNOSIS — F32A Depression, unspecified: Secondary | ICD-10-CM

## 2017-02-15 DIAGNOSIS — F329 Major depressive disorder, single episode, unspecified: Secondary | ICD-10-CM

## 2017-02-15 DIAGNOSIS — G894 Chronic pain syndrome: Secondary | ICD-10-CM

## 2017-03-14 ENCOUNTER — Other Ambulatory Visit: Payer: Self-pay | Admitting: Physician Assistant

## 2017-03-14 DIAGNOSIS — G894 Chronic pain syndrome: Secondary | ICD-10-CM

## 2017-04-03 ENCOUNTER — Other Ambulatory Visit: Payer: Self-pay | Admitting: Physician Assistant

## 2017-04-03 DIAGNOSIS — G894 Chronic pain syndrome: Secondary | ICD-10-CM

## 2017-05-02 ENCOUNTER — Other Ambulatory Visit: Payer: Self-pay | Admitting: Physician Assistant

## 2017-05-02 DIAGNOSIS — G894 Chronic pain syndrome: Secondary | ICD-10-CM

## 2017-05-13 ENCOUNTER — Other Ambulatory Visit: Payer: Self-pay | Admitting: Physician Assistant

## 2017-05-13 DIAGNOSIS — G894 Chronic pain syndrome: Secondary | ICD-10-CM

## 2017-05-13 NOTE — Telephone Encounter (Signed)
See attached for Neurontin

## 2017-06-09 ENCOUNTER — Inpatient Hospital Stay (HOSPITAL_COMMUNITY)
Admission: EM | Admit: 2017-06-09 | Discharge: 2017-06-09 | DRG: 561 | Disposition: A | Discharge status: Home / Self Care | Payer: Commercial Managed Care - PPO | Attending: Orthopedic Surgery | Admitting: Orthopedic Surgery

## 2017-06-09 ENCOUNTER — Encounter (HOSPITAL_COMMUNITY): Payer: Self-pay

## 2017-06-09 ENCOUNTER — Emergency Department (HOSPITAL_COMMUNITY): Payer: Commercial Managed Care - PPO

## 2017-06-09 ENCOUNTER — Other Ambulatory Visit: Payer: Self-pay

## 2017-06-09 DIAGNOSIS — E119 Type 2 diabetes mellitus without complications: Secondary | ICD-10-CM | POA: Diagnosis present

## 2017-06-09 DIAGNOSIS — Z7982 Long term (current) use of aspirin: Secondary | ICD-10-CM

## 2017-06-09 DIAGNOSIS — F17210 Nicotine dependence, cigarettes, uncomplicated: Secondary | ICD-10-CM | POA: Diagnosis present

## 2017-06-09 DIAGNOSIS — Z809 Family history of malignant neoplasm, unspecified: Secondary | ICD-10-CM

## 2017-06-09 DIAGNOSIS — Z96651 Presence of right artificial knee joint: Secondary | ICD-10-CM | POA: Diagnosis present

## 2017-06-09 DIAGNOSIS — Z7984 Long term (current) use of oral hypoglycemic drugs: Secondary | ICD-10-CM

## 2017-06-09 DIAGNOSIS — I10 Essential (primary) hypertension: Secondary | ICD-10-CM | POA: Diagnosis present

## 2017-06-09 DIAGNOSIS — Z981 Arthrodesis status: Secondary | ICD-10-CM | POA: Diagnosis not present

## 2017-06-09 DIAGNOSIS — Z8349 Family history of other endocrine, nutritional and metabolic diseases: Secondary | ICD-10-CM

## 2017-06-09 DIAGNOSIS — E785 Hyperlipidemia, unspecified: Secondary | ICD-10-CM | POA: Diagnosis present

## 2017-06-09 DIAGNOSIS — T8453XA Infection and inflammatory reaction due to internal right knee prosthesis, initial encounter: Secondary | ICD-10-CM | POA: Diagnosis not present

## 2017-06-09 DIAGNOSIS — K219 Gastro-esophageal reflux disease without esophagitis: Secondary | ICD-10-CM | POA: Diagnosis present

## 2017-06-09 DIAGNOSIS — Z888 Allergy status to other drugs, medicaments and biological substances status: Secondary | ICD-10-CM

## 2017-06-09 DIAGNOSIS — Z818 Family history of other mental and behavioral disorders: Secondary | ICD-10-CM | POA: Diagnosis not present

## 2017-06-09 DIAGNOSIS — Z8249 Family history of ischemic heart disease and other diseases of the circulatory system: Secondary | ICD-10-CM | POA: Diagnosis not present

## 2017-06-09 DIAGNOSIS — T8453XS Infection and inflammatory reaction due to internal right knee prosthesis, sequela: Secondary | ICD-10-CM

## 2017-06-09 DIAGNOSIS — Z90710 Acquired absence of both cervix and uterus: Secondary | ICD-10-CM

## 2017-06-09 DIAGNOSIS — M25561 Pain in right knee: Secondary | ICD-10-CM | POA: Diagnosis not present

## 2017-06-09 DIAGNOSIS — M009 Pyogenic arthritis, unspecified: Secondary | ICD-10-CM | POA: Diagnosis present

## 2017-06-09 LAB — CBC WITH DIFFERENTIAL/PLATELET
Basophils Absolute: 0 10*3/uL (ref 0.0–0.1)
Basophils Relative: 0 %
EOS ABS: 0.1 10*3/uL (ref 0.0–0.7)
EOS PCT: 1 %
HCT: 33.8 % — ABNORMAL LOW (ref 36.0–46.0)
Hemoglobin: 11 g/dL — ABNORMAL LOW (ref 12.0–15.0)
LYMPHS ABS: 1.5 10*3/uL (ref 0.7–4.0)
LYMPHS PCT: 9 %
MCH: 32.4 pg (ref 26.0–34.0)
MCHC: 32.5 g/dL (ref 30.0–36.0)
MCV: 99.7 fL (ref 78.0–100.0)
MONO ABS: 1.7 10*3/uL — AB (ref 0.1–1.0)
MONOS PCT: 10 %
Neutro Abs: 14.1 10*3/uL — ABNORMAL HIGH (ref 1.7–7.7)
Neutrophils Relative %: 80 %
PLATELETS: 265 10*3/uL (ref 150–400)
RBC: 3.39 MIL/uL — AB (ref 3.87–5.11)
RDW: 12.6 % (ref 11.5–15.5)
WBC: 17.5 10*3/uL — AB (ref 4.0–10.5)

## 2017-06-09 LAB — SYNOVIAL CELL COUNT + DIFF, W/ CRYSTALS
Crystals, Fluid: NONE SEEN
Eosinophils-Synovial: 0 % (ref 0–1)
Lymphocytes-Synovial Fld: 2 % (ref 0–20)
Monocyte-Macrophage-Synovial Fluid: 8 % — ABNORMAL LOW (ref 50–90)
Neutrophil, Synovial: 90 % — ABNORMAL HIGH (ref 0–25)
WBC, Synovial: 155000 /mm3 — ABNORMAL HIGH (ref 0–200)

## 2017-06-09 LAB — BASIC METABOLIC PANEL
Anion gap: 6 (ref 5–15)
BUN: 20 mg/dL (ref 6–20)
CALCIUM: 9.6 mg/dL (ref 8.9–10.3)
CO2: 27 mmol/L (ref 22–32)
CREATININE: 0.79 mg/dL (ref 0.44–1.00)
Chloride: 102 mmol/L (ref 101–111)
GFR calc Af Amer: >60 mL/min (ref >60)
GLUCOSE: 95 mg/dL (ref 65–99)
Potassium: 4.4 mmol/L (ref 3.5–5.1)
SODIUM: 135 mmol/L (ref 135–145)

## 2017-06-09 LAB — C-REACTIVE PROTEIN: CRP: 28.9 mg/dL — ABNORMAL HIGH (ref <1.0)

## 2017-06-09 LAB — SEDIMENTATION RATE: SED RATE: 58 mm/h — AB (ref 0–22)

## 2017-06-09 MED ORDER — OXYCODONE-ACETAMINOPHEN 5-325 MG PO TABS
1.0000 | ORAL_TABLET | ORAL | Status: DC | PRN
  Administered 2017-06-09: 1 via ORAL
  Filled 2017-06-09: qty 1

## 2017-06-09 MED ORDER — HYDROMORPHONE HCL 1 MG/ML IJ SOLN
1.0000 mg | Freq: Once | INTRAMUSCULAR | Status: AC
  Administered 2017-06-09: 1 mg via INTRAVENOUS
  Filled 2017-06-09: qty 1

## 2017-06-09 MED ORDER — OXYCODONE-ACETAMINOPHEN 10-325 MG PO TABS: 1.0000 | ORAL_TABLET | ORAL | 0 refills | Status: AC | PRN

## 2017-06-09 MED ORDER — DOXYCYCLINE HYCLATE 100 MG PO CAPS: 100.0000 mg | ORAL_CAPSULE | Freq: Two times a day (BID) | ORAL | 0 refills | Status: AC

## 2017-06-09 MED ORDER — VANCOMYCIN HCL IN DEXTROSE 1-5 GM/200ML-% IV SOLN
1000.0000 mg | INTRAVENOUS | Status: DC
Start: 2017-06-10 — End: 2017-06-10

## 2017-06-09 MED ORDER — MORPHINE SULFATE (PF) 2 MG/ML IV SOLN: 1.0000 mg | INTRAVENOUS | Status: DC | PRN

## 2017-06-09 MED ORDER — LORAZEPAM 2 MG/ML IJ SOLN
1.0000 mg | Freq: Once | INTRAMUSCULAR | Status: AC
  Administered 2017-06-09: 1 mg via INTRAVENOUS
  Filled 2017-06-09: qty 1

## 2017-06-09 MED ORDER — AMOXICILLIN-POT CLAVULANATE 875-125 MG PO TABS: 1.0000 | ORAL_TABLET | Freq: Two times a day (BID) | ORAL | 0 refills | Status: DC

## 2017-06-09 MED ORDER — POTASSIUM CHLORIDE IN NACL 20-0.9 MEQ/L-% IV SOLN: INTRAVENOUS | Status: DC

## 2017-06-09 MED ORDER — ACETAMINOPHEN 325 MG PO TABS: 650.0000 mg | ORAL_TABLET | Freq: Four times a day (QID) | ORAL | Status: DC | PRN

## 2017-06-09 MED ORDER — ACETAMINOPHEN 650 MG RE SUPP: 650.0000 mg | Freq: Four times a day (QID) | RECTAL | Status: DC | PRN

## 2017-06-09 MED ORDER — HYDROCODONE-ACETAMINOPHEN 5-325 MG PO TABS: 1.0000 | ORAL_TABLET | ORAL | Status: DC | PRN

## 2017-06-09 MED ORDER — PIPERACILLIN-TAZOBACTAM 3.375 G IVPB: 3.3750 g | Freq: Three times a day (TID) | INTRAVENOUS | Status: DC

## 2017-06-09 MED ORDER — NICOTINE 21 MG/24HR TD PT24
21.0000 mg | MEDICATED_PATCH | Freq: Once | TRANSDERMAL | Status: DC
  Administered 2017-06-09: 21 mg via TRANSDERMAL
  Filled 2017-06-09: qty 1

## 2017-06-09 MED ORDER — LIDOCAINE-EPINEPHRINE (PF) 2 %-1:200000 IJ SOLN
20.0000 mL | Freq: Once | INTRAMUSCULAR | Status: AC
  Administered 2017-06-09: 20 mL
  Filled 2017-06-09: qty 20

## 2017-06-09 MED ORDER — POLYETHYLENE GLYCOL 3350 17 G PO PACK: 17.0000 g | PACK | Freq: Every day | ORAL | Status: DC

## 2017-06-09 MED ORDER — HYDROCODONE-ACETAMINOPHEN 5-325 MG PO TABS
1.0000 | ORAL_TABLET | Freq: Once | ORAL | Status: AC
  Administered 2017-06-09: 1 via ORAL
  Filled 2017-06-09: qty 1

## 2017-06-09 MED ORDER — DOCUSATE SODIUM 100 MG PO CAPS: 100.0000 mg | ORAL_CAPSULE | Freq: Two times a day (BID) | ORAL | Status: DC

## 2017-06-09 MED ORDER — PIPERACILLIN-TAZOBACTAM 3.375 G IVPB 30 MIN
3.3750 g | Freq: Once | INTRAVENOUS | Status: AC
  Administered 2017-06-09: 3.375 g via INTRAVENOUS
  Filled 2017-06-09: qty 50

## 2017-06-09 MED ORDER — VANCOMYCIN HCL 10 G IV SOLR
1500.0000 mg | Freq: Once | INTRAVENOUS | Status: AC
  Administered 2017-06-09: 1500 mg via INTRAVENOUS
  Filled 2017-06-09: qty 1500

## 2017-06-09 NOTE — Progress Notes (Signed)
Pharmacy Antibiotic Note  Brittney Sawyer is a 52 y.o. female admitted on 06/09/2017 with infected TKA.  Pharmacy has been consulted for vancomycin and zosyn dosing.  Afebrile here WBC 17.5 SCr 0.79, CrCl 77 ml/min ESR 58, CRP 28.9 R synovial fluid cx pending  Plan: Zosyn 3.375gm IV q8h (4hr extended infusions) Vancomycin 1564m IV x 1, then 1g IV q24h (estimated AUC 474) Check vancomycin peak and trough at steady state to calculate AUC (goal 400-500 Follow up renal function & cultures  Height: 5' 0.25" (153 cm) Weight: 175 lb (79.4 kg) IBW/kg (Calculated) : 46.08  Temp (24hrs), Avg:98.4 F (36.9 C), Min:98.4 F (36.9 C), Max:98.4 F (36.9 C)  Recent Labs  Lab 06/09/17 1228  WBC 17.5*  CREATININE 0.79    Estimated Creatinine Clearance: 77.1 mL/min (by C-G formula based on SCr of 0.79 mg/dL).    Allergies  Allergen Reactions  . Celebrex [Celecoxib] Swelling    UNSPECIFIED AREA  . Wellbutrin [Bupropion] Rash    Antimicrobials this admission:  1/31 Vanc >> 1/31 Zosyn >>  Dose adjustments this admission:    Microbiology results:  1/31 R knee fluid:  Thank you for allowing pharmacy to be a part of this patient's care.  EPeggyann Juba PharmD, BCPS Pager: [phone]/31/2019 7:19 PM

## 2017-06-09 NOTE — Discharge Instructions (Signed)
Call Dr. Tobin Chad office first thing tomorrow morning to schedule an appointment on 06/14/2016.  Take 1 tablet of Augmentin every 12 hours until you are seen by Dr. Sherlean Foot.  Take 1 tablet of doxycycline every 12 hours.  I have written you for 1 week of each of these medications.  When he see Dr. Sherlean Foot, please make sure that he writes you a new prescription for the length of time that he wants you to be on these antibiotics.    For severe pain, take 1 tablet of Percocet every 4 hours.  Please note, this  medication is a narcotic and you should not work or drive while taking this medication.  It can also be addicting.   You can also take 650 mg of Tylenol every 6 hours for mild to moderate pain or 600 mg of ibuprofen with food every 8 hours for mild to moderate pain.  Please note that every tablet of Percocet also has 325 mg of Tylenol in it.  Please do not take more than 4000 mg of Tylenol in a 24-hour.  If you develop new or worsening symptoms, including significantly worsening pain in the right knee, a fever despite taking Tylenol, or if the knee becomes red and hot to the touch, or other concerning symptoms, please return to the emergency department immediately.

## 2017-06-09 NOTE — ED Provider Notes (Signed)
With complaint of right knee pain onset 2 days ago accompanied by subjective fever pain is worse with weightbearing and improved with nonweightbearing no trauma.  On exam patient is alert Glasgow Coma Score 15.  Nontoxic appearing right lower extremity swollen reddened and tender at knee.  There is a surgical scar at the anterior knee.  DP pulse 2+.  No redness proximal to the knee.   Doug Sou, MD 06/09/17 [phone]

## 2017-06-09 NOTE — ED Notes (Signed)
Upon starting patient's vancomycin, patient made aware antibiotic would take approximately two hours. Patient states "I cannot stay two more hours. I cannot do it. You can take this out of my hand right now." PA called to bedside.

## 2017-06-09 NOTE — Progress Notes (Signed)
I was asked to see the patient at the request of Dr. Carola Frost as he was busy operating.  The patient had a total joint procedure done by Dr. Madelon Lips in [place] about 4 years ago.  She did well for about a year and then was up in [place] where she had sudden onset pain in the knee and swelling.  Aspiration look cloudy and ultimately had 80,000 white cells with a left shift.  Culture never grew but she was taken to the operating room for irrigation and debridement and poly-swap.  She did well for some time after this and was followed in [place] by infectious disease for long-term antibiotic therapy of 6 weeks.  She ultimately had her PICC line pulled and for reasons that are unknown underwent a bone scan in 2016.  The bone scan showed increased uptake around all 3 components consistent with loosening or infection.  At that point the decision was made to watch this.  She had done well up until Tuesday when she was feeling febrile at home and had some increased pain in her knee.  She began having increasing swelling her knee and she presented to the emergency room tonight for evaluation.  At the request of Dr. Carola Frost I evaluated the patient who does not look septic.  She is having pain with range of motion of the knee and difficulty walking on the leg.  She had a white count of 17 5 and a sed rate of 50.  I aspirated the knee for 70 cc of cloudy fluid which will be sent for Gram stain and culture.  The emergency room will get in touch with Dr. Carola Frost for admission and/or treatment options from this point forward.

## 2017-06-09 NOTE — ED Provider Notes (Signed)
[facility]-EMERGENCY DEPT Provider Note   CSN: [id number] Arrival date & time: 06/09/17  1136     History   Chief Complaint Chief Complaint  Patient presents with  . Knee Pain  . Joint Swelling    HPI Brittney Sawyer is a 52 y.o. female with a h/o of DM Type II and right knee DJD s/p TKA who presents to the emergency department with a chief complaint of atraumatic right knee pain and swelling that began suddenly 2 nights ago with associated tactile fever.  She states "this feels exactly like the last time my knee was infected."  She characterizes the pain as sharp and it has been constant since onset.  She states that she has been able to put less and less weight on the right foot and her range of motion has gradually decreased since onset of symptoms.  She has treated her fever with Tylenol and Motrin last dose at 11 AM of 600 mg of Motrin and 1000 mg of Tylenol.  She reports that the right knee felt warm to the touch last night, but has improved at this morning.  She denies erythema, right ankle or hip pain, numbness, weakness, pain to the calf or thigh, chills, chest pain, or shortness of breath.  No new exercises, activities, falls, or injuries.  She reports that she does an hour long low impact workout daily, but has been performing the same workout for months.  Surgical history includes right TKA on 01/26/13 by Dr. Madelon Lips secondary to DJD. She also had an I&D and poly exchange of the right knee at [facility] in 08/15 after aspirate from the joint showed cloudy purulent fluid from 50 mLs with a cell count of 87,000 and 82% neutrophils.  Negative Gram stain, negative crystals.  She was started empirically on vancomycin and cefepime.  Cultures did not isolate any bacteria.  She had a PICC line placed on 8/19 with a regimen of daptomycin plus ceftriaxone for 42 days. She had an amputation of the 3rd ray on the right foot secondary to chronic osteomyelitis  by Dr. Lajoyce Corners on 10/03/15.  She was last seen for her right knee by Dr. Sherlean Foot on 09/12/14.  She had recently had a bone scan on 04/27/16by Dr. Madelon Lips that did show some loosening, but labs were normal with a WBC 3800. No CRP or sed rate was available at that time.  She was advised to let Dr. Madelon Lips continue with her pain medication and watch for any signs of infection. Last A1C was 4.9 on 03/10/16.  She is a current every day smoker and had previously quit for 1 year, and is interested in smoking cessation.  The history is provided by the patient. No language interpreter was used.    Past Medical History:  Diagnosis Date  . Anxiety   . Depression   . Diabetes mellitus type 2, controlled (HCC) 05/20/2014  . GERD (gastroesophageal reflux disease)    heartburn  . Hyperlipidemia   . Hypertension   . Neuropathy    post back surgery  . Pneumonia   . Right knee DJD 01/27/2013  . Substance abuse (HCC)    Alcohol - sober since 2006    Patient Active Problem List   Diagnosis Date Noted  . Septic joint (HCC) 06/09/2017  . History of tobacco use 08/31/2016  . Diabetes mellitus type 2, controlled (HCC) 05/20/2014  . Right knee DJD 01/27/2013  . Vaginal candidiasis 01/27/2013  . Metabolic  syndrome 12/20/2012  . Obesity (BMI 35.0-39.9 without comorbidity) 12/19/2012  . History of substance use disorder 01/07/2012  . Menopause 11/25/2011  . DDD (degenerative disc disease), lumbar 11/25/2011  . HTN (hypertension) 10/21/2011  . Mood disorder (HCC) 10/21/2011  . Chronic pain 10/21/2011    Past Surgical History:  Procedure Laterality Date  . ABDOMINAL HYSTERECTOMY    . AMPUTATION Right 10/03/2015   Procedure: Right Foot 3rd Ray Amputation;  Surgeon: Nadara Mustard, MD;  Location: [facility] OR;  Service: Orthopedics;  Laterality: Right;  . BACK SURGERY  2009, July and Sept   L4, L5 Fusion,  second surgery L2-L% and S1  . BUNIONECTOMY    . JOINT REPLACEMENT    . SHOULDER SURGERY Left   . SPINE  SURGERY    . TOTAL KNEE ARTHROPLASTY Right 01/26/2013   Procedure: TOTAL KNEE ARTHROPLASTY;  Surgeon: Thera Flake., MD;  Location: [facility] OR;  Service: Orthopedics;  Laterality: Right;    OB History    No data available       Home Medications    Prior to Admission medications   Medication Sig Start Date End Date Taking? Authorizing Provider  acetaminophen (TYLENOL) 500 MG tablet Take 2 tablets (1,000 mg total) by mouth every 8 (eight) hours as needed. 08/26/16  Yes Sherren Mocha, MD  acyclovir (ZOVIRAX) 200 MG capsule TAKE TWO CAPSULES BY MOUTH THREE TIMES A DAY FOR 5 DAYS AS NEEDED FOR OUTBREAK 11/13/16  Yes English, Stephanie D, PA  aspirin EC 81 MG tablet Take 81 mg by mouth daily.   Yes [provider]  DULoxetine (CYMBALTA) 60 MG capsule TAKE ONE CAPSULE BY MOUTH TWICE A DAY **MUST CALL MD FOR APPOINTMENT 03/16/17  Yes English, Judeth Cornfield D, PA  gabapentin (NEURONTIN) 800 MG tablet Take 1 tablet (800 mg total) by mouth 4 (four) times daily. Office visit needed 05/02/17  Yes English, [place] D, PA  ibuprofen (ADVIL,MOTRIN) 200 MG tablet Take 600 mg by mouth every 6 (six) hours as needed for moderate pain.   Yes [provider]  lisinopril-hydrochlorothiazide (PRINZIDE,ZESTORETIC) 20-25 MG tablet TAKE ONE TABLET BY MOUTH DAILY 09/03/16  Yes Sherren Mocha, MD  metFORMIN (GLUCOPHAGE) 500 MG tablet TAKE ONE TABLET BY MOUTH EVERY NIGHT AT BEDTIME 09/03/16  Yes Sherren Mocha, MD  Multiple Vitamin (MULTIVITAMIN) tablet Take 1 tablet by mouth daily.   Yes [provider]  traZODone (DESYREL) 100 MG tablet TAKE ONE TABLET BY MOUTH THREE TIMES A DAY -- NEED OFFICE VISIT Patient taking differently: TAKE 200MG  BY MOUTH AT BEDTIME 01/20/17  Yes Ethelda Chick, MD  amoxicillin-clavulanate (AUGMENTIN) 875-125 MG tablet Take 1 tablet by mouth every 12 (twelve) hours. 06/09/17   McDonald, Mia A, PA-C  doxycycline (VIBRAMYCIN) 100 MG capsule Take 1 capsule (100 mg total) by mouth 2 (two)  times daily for 7 days. 06/09/17 06/16/17  McDonald, Mia A, PA-C  oxyCODONE-acetaminophen (PERCOCET) 10-325 MG tablet Take 1 tablet by mouth every 4 (four) hours as needed for up to 5 days for pain. 06/09/17 06/14/17  McDonald, Coral Else, PA-C    Family History Family History  Problem Relation Age of Onset  . Thyroid disease Mother   . Hypertension Father   . Anxiety disorder Sister   . Cancer Paternal Grandmother        prostate    Social History Social History   Tobacco Use  . Smoking status: Current Every Day Smoker    Packs/day: 0.50  Years: 10.00    Pack years: 5.00    Types: Cigarettes    Last attempt to quit: 03/10/2016    Years since quitting: 1.2  . Smokeless tobacco: Never Used  Substance Use Topics  . 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  . Drug use: Yes    Types: Marijuana, Oxycodone, Hydrocodone    Comment: occasionally     Allergies   Celebrex [celecoxib] and Wellbutrin [bupropion]   Review of Systems Review of Systems  Constitutional: Positive for fever. Negative for activity change and chills.  Eyes: Negative for visual disturbance.  Respiratory: Negative for shortness of breath.   Cardiovascular: Negative for chest pain.  Gastrointestinal: Negative for abdominal pain, diarrhea, nausea and vomiting.  Genitourinary: Negative for dysuria.  Musculoskeletal: Positive for arthralgias, gait problem and joint swelling. Negative for back pain, myalgias, neck pain and neck stiffness.  Skin: Negative for rash.  Allergic/Immunologic: Positive for immunocompromised state.  Neurological: Negative for weakness.  Hematological: Does not bruise/bleed easily.     Physical Exam Updated Vital Signs BP (!) 104/46   Pulse 82   Temp 98.4 F (36.9 C) (Oral)   Resp 18   Ht 5' 0.25" (1.53 m)   Wt 79.4 kg (175 lb)   SpO2 98%   BMI 33.89 kg/m   Physical Exam  Constitutional: No distress.    HENT:  Head: Normocephalic.  Eyes: Conjunctivae are normal.  Neck: Neck supple.  Cardiovascular: Normal rate, regular rhythm and intact distal pulses. Exam reveals no gallop and no friction rub.  No murmur heard. Pulmonary/Chest: Effort normal. No stridor. No respiratory distress. She has no wheezes. She exhibits no tenderness.  Abdominal: Soft. She exhibits no distension.  Musculoskeletal: She exhibits edema and tenderness. She exhibits no deformity.  The right knee is edematous.  No warmth or erythema.  Well-healed vertical surgical scar without dehiscence or drainage.  Exquisitely tender to palpation diffusely to the right knee right hip and ankle are nontender to palpation with full range of motion.  She is able to fully extend the knee, but it is extremely painful.   5 out of 5 strength against resistance with dorsiflexion plantarflexion.  DP and PT pulses are 2+ and symmetric.  Sensation is intact throughout the bilateral lower extremities.  No edema to the bilateral lower extremities.  Neurological: She is alert.  Skin: Skin is warm. No rash noted.  Psychiatric: Her behavior is normal.  Nursing note and vitals reviewed.    ED Treatments / Results  Labs (all labs ordered are listed, but only abnormal results are displayed) Labs Reviewed  CBC WITH DIFFERENTIAL/PLATELET - Abnormal; Notable for the following components:      Result Value   WBC 17.5 (*)    RBC 3.39 (*)    Hemoglobin 11.0 (*)    HCT 33.8 (*)    Neutro Abs 14.1 (*)    Monocytes Absolute 1.7 (*)    All other components within normal limits  SEDIMENTATION RATE - Abnormal; Notable for the following components:   Sed Rate 58 (*)    All other components within normal limits  C-REACTIVE PROTEIN - Abnormal; Notable for the following components:   CRP 28.9 (*)    All other components within normal limits  SYNOVIAL CELL COUNT + DIFF, W/ CRYSTALS - Abnormal; Notable for the following components:   Appearance-Synovial  TURBID (*)    WBC,  Synovial 155,000 (*)    Neutrophil, Synovial 90 (*)    Monocyte-Macrophage-Synovial Fluid 8 (*)    All other components within normal limits  BODY FLUID CULTURE  BASIC METABOLIC PANEL  GLUCOSE, BODY FLUID OTHER  PROTEIN, BODY FLUID (OTHER)  CREATININE, SERUM    EKG  EKG Interpretation None       Radiology Dg Knee Complete 4 Views Right  Result Date: 06/09/2017 CLINICAL DATA:  Swelling and pain of the knee EXAM: RIGHT KNEE - COMPLETE 4+ VIEW COMPARISON:  None. FINDINGS: The right knee demonstrates a total knee arthroplasty without evidence of hardware failure complication. There is a large joint effusion. There is no fracture or dislocation. The alignment is anatomic. IMPRESSION: 1.  No acute osseous injury of the right knee. 2. Large right knee joint effusion. Electronically Signed   By: Elige Ko   On: 06/09/2017 12:56    Procedures Procedures (including critical care time)  Medications Ordered in ED Medications  acetaminophen (TYLENOL) tablet 650 mg (not administered)    Or  acetaminophen (TYLENOL) suppository 650 mg (not administered)  HYDROcodone-acetaminophen (NORCO/VICODIN) 5-325 MG per tablet 1-2 tablet (not administered)  morphine 2 MG/ML injection 1-2 mg (not administered)  docusate sodium (COLACE) capsule 100 mg (not administered)  vancomycin (VANCOCIN) 1,500 mg in sodium chloride 0.9 % 500 mL IVPB (1,500 mg Intravenous New Bag/Given 06/09/17 2021)  piperacillin-tazobactam (ZOSYN) IVPB 3.375 g (not administered)  vancomycin (VANCOCIN) IVPB 1000 mg/200 mL premix (not administered)  nicotine (NICODERM CQ - dosed in mg/24 hours) patch 21 mg (21 mg Transdermal Patch Applied 06/09/17 2031)  HYDROcodone-acetaminophen (NORCO/VICODIN) 5-325 MG per tablet 1 tablet (1 tablet Oral Given 06/09/17 1234)  lidocaine-EPINEPHrine (XYLOCAINE W/EPI) 2 %-1:200000 (PF) injection 20 mL (20 mLs Infiltration Given 06/09/17 1532)  HYDROmorphone (DILAUDID)  injection 1 mg (1 mg Intravenous Given 06/09/17 1837)  piperacillin-tazobactam (ZOSYN) IVPB 3.375 g (0 g Intravenous Stopped 06/09/17 2022)  LORazepam (ATIVAN) injection 1 mg (1 mg Intravenous Given 06/09/17 2046)     Initial Impression / Assessment and Plan / ED Course  I have reviewed the triage vital signs and the nursing notes.  Pertinent labs & imaging results that were available during my care of the patient were reviewed by me and considered in my medical decision making (see chart for details).     52 year old female with a h/o of DM Type II and right knee DJD s/p TKA who presents to the emergency department with a chief complaint of atraumatic right knee pain and swelling that began suddenly 2 nights ago with associated tactile fever.  She reports that her symptoms feel just like the last time that she had an emergent I&D with poly-exchange in 2016.  The patient was seen and evaluated with Dr. Rennis Chris, attending physician. On physical exam she is exquisitely tender to palpation, the right knee is edematous, but no warmth or erythema is noted.  She is able to fully extend the knee, but it is exquisitely painful.  WBC 17.5.  Sed rate 58 and a CRP 28.9.  Right knee x-ray with large joint effusion. Consulted orthopedic surgery and spoke with Josh, Dr. Candise Bowens PA, who initially performed the patient's right TKA in 2014 who recommended consulting Dr. Carola Frost who was the on-call physician for [facility].  Phone call was returned by Dale Scio, PA-C, who recommended arthrocentesis and to run the patient by Dr. Luiz Blare who is on-call for [facility] for orthopedics.  Spoke with Dr. Luiz Blare PA Gus Puma denies that  Dr. Delorise Shiner was currently in the OR and also recommended an arthrocentesis.  The patient was seen by Dr. Luiz Blare who performed arthrocentesis.  Synovial fluid appeared cloudy and yellow with a small amount of blood noted.  Spoke with Montez Morita, PA-C after the arthrocentesis who  initially planned to admit the patient with an I&D at [facility] tomorrow morning, but called back after speaking with the Dr. Sherlean Foot who felt that she was stable enough to follow up with him in his office on Tuesday.  He recommended 1 dose of IV vancomycin and Zosyn in the ED and to discharge the patient to home with Augmentin and doxycycline and to have the patient follow-up with Dr. Sherlean Foot on 06/14/2017.  Gram stain with a predominantly WBCs and PMNs.  Synovial WBC 155K and 90 neutrophils.  The patient was given extremely strict return precautions if anything worsened with the right needs to return to the emergency department.  At this time, she is in no acute distress and is hemodynamically stable we will discharge the patient on her previous.  Dose of Percocet after her initial surgery with enough medication until she is seen by Dr. Sherlean Foot. A 57-month prescription history query was performed using the [place] CSRS prior to discharge.  The patient is safe for discharge to home at this time.   Final Clinical Impressions(s) / ED Diagnoses   Final diagnoses:  Infection associated with internal right knee prosthesis, sequela    ED Discharge Orders        Ordered    amoxicillin-clavulanate (AUGMENTIN) 875-125 MG tablet  Every 12 hours     06/09/17 1938    doxycycline (VIBRAMYCIN) 100 MG capsule  2 times daily     06/09/17 1938    oxyCODONE-acetaminophen (PERCOCET) 10-325 MG tablet  Every 4 hours PRN     06/09/17 1941       McDonald, Mia A, PA-C 06/09/17 2132    Doug Sou, MD 06/10/17 [phone]

## 2017-06-09 NOTE — ED Triage Notes (Signed)
Patient c/o right knee swelling, pain, and fever yesterday. Patient has increased pain with weight bearing.

## 2017-06-10 LAB — PROTEIN, BODY FLUID (OTHER): TOTAL PROTEIN, BODY FLUID OTHER: 5.1 g/dL

## 2017-06-10 LAB — GLUCOSE, BODY FLUID OTHER

## 2017-06-13 LAB — BODY FLUID CULTURE: Culture: NO GROWTH

## 2017-06-15 ENCOUNTER — Other Ambulatory Visit: Payer: Self-pay | Admitting: Orthopedic Surgery

## 2017-06-16 NOTE — Pre-Procedure Instructions (Signed)
Brittney Sawyer  06/16/2017      Brittney Sawyer [facility] [address], [place] - [address] [place] [address] Phone: [phone] Fax: [phone]    Your procedure is scheduled on June 20, 2017.  Report to [facility] Admitting at 10:00 A.M.  Call this number if you have problems the morning of surgery:  [phone]   Remember:   Do not eat food or drink liquids after midnight.   Take these medicines the morning of surgery with A SIP OF WATER :  Duloxetine (Cymbalta) Gabapentin (Neurontin)  DO NOT take your Metformin the morning of surgery.  7 days prior to surgery STOP taking any Aspirin (unless otherwise instructed by your surgeon), Aleve, Naproxen, Ibuprofen, Motrin, Advil, Goody's, BC's, all herbal medications, fish oil, and all vitamins.     How to Manage Your Diabetes Before and After Surgery  Why is it important to control my blood sugar before and after surgery? . Improving blood sugar levels before and after surgery helps healing and can limit problems. . A way of improving blood sugar control is eating a healthy diet by: o  Eating less sugar and carbohydrates o  Increasing activity/exercise o  Talking with your doctor about reaching your blood sugar goals . High blood sugars (greater than 180 mg/dL) can raise your risk of infections and slow your recovery, so you will need to focus on controlling your diabetes during the weeks before surgery. . Make sure that the doctor who takes care of your diabetes knows about your planned surgery including the date and location.  How do I manage my blood sugar before surgery? . Check your blood sugar at least 4 times a day, starting 2 days before surgery, to make sure that the level is not too high or low. o Check your blood sugar the morning of your surgery when you wake up and every 2 hours until you get to the Short Stay unit. . If your blood sugar is less than 70  mg/dL, you will need to treat for low blood sugar: o Do not take insulin. o Treat a low blood sugar (less than 70 mg/dL) with  cup of clear juice (cranberry or apple), 4 glucose tablets, OR glucose gel. Recheck blood sugar in 15 minutes after treatment (to make sure it is greater than 70 mg/dL). If your blood sugar is not greater than 70 mg/dL on recheck, call [phone] o  for further instructions. . Report your blood sugar to the short stay nurse when you get to Short Stay.  . If you are admitted to the hospital after surgery: o Your blood sugar will be checked by the staff and you will probably be given insulin after surgery (instead of oral diabetes medicines) to make sure you have good blood sugar levels. o The goal for blood sugar control after surgery is 80-180 mg/dL.       Do not wear jewelry, make-up or nail polish.  Do not wear lotions, powders, or perfumes, or deodorant.  Do not shave 48 hours prior to surgery.    Do not bring valuables to the hospital.   [facility] is not responsible for any belongings or valuables.  Contacts, dentures or bridgework may not be worn into surgery.  Leave your suitcase in the car.  After surgery it may be brought to your room.  For patients admitted to the hospital, discharge time will be determined by your  treatment team.  Patients discharged the day of surgery will not be allowed to drive home.   Special instructions:   [place]- Preparing For Surgery  Before surgery, you can play an important role. Because skin is not sterile, your skin needs to be as free of germs as possible. You can reduce the number of germs on your skin by washing with CHG (chlorahexidine gluconate) Soap before surgery.  CHG is an antiseptic cleaner which kills germs and bonds with the skin to continue killing germs even after washing.  Please do not use if you have an allergy to CHG or antibacterial soaps. If your skin becomes reddened/irritated stop using  the CHG.  Do not shave (including legs and underarms) for at least 48 hours prior to first CHG shower. It is OK to shave your face.  Please follow these instructions carefully.   1. Shower the NIGHT BEFORE SURGERY and the MORNING OF SURGERY with CHG.   2. If you chose to wash your hair, wash your hair first as usual with your normal shampoo.  3. After you shampoo, rinse your hair and body thoroughly to remove the shampoo.  4. Use CHG as you would any other liquid soap. You can apply CHG directly to the skin and wash gently with a scrungie or a clean washcloth.   5. Apply the CHG Soap to your body ONLY FROM THE NECK DOWN.  Do not use on open wounds or open sores. Avoid contact with your eyes, ears, mouth and genitals (private parts). Wash Face and genitals (private parts)  with your normal soap.  6. Wash thoroughly, paying special attention to the area where your surgery will be performed.  7. Thoroughly rinse your body with warm water from the neck down.  8. DO NOT shower/wash with your normal soap after using and rinsing off the CHG Soap.  9. Pat yourself dry with a CLEAN TOWEL.  10. Wear CLEAN PAJAMAS to bed the night before surgery, wear comfortable clothes the morning of surgery  11. Place CLEAN SHEETS on your bed the night of your first shower and DO NOT SLEEP WITH PETS.    Day of Surgery: Do not apply any deodorants/lotions. Please wear clean clothes to the hospital/surgery center.      Please read over the following fact sheets that you were given.

## 2017-06-17 ENCOUNTER — Other Ambulatory Visit: Payer: Self-pay

## 2017-06-17 ENCOUNTER — Encounter (HOSPITAL_COMMUNITY): Payer: Self-pay

## 2017-06-17 ENCOUNTER — Encounter (HOSPITAL_COMMUNITY)
Admission: RE | Admit: 2017-06-17 | Discharge: 2017-06-17 | Disposition: A | Discharge status: Home / Self Care | Payer: Commercial Managed Care - PPO | Source: Ambulatory Visit | Attending: Orthopedic Surgery | Admitting: Orthopedic Surgery

## 2017-06-17 LAB — CBC WITH DIFFERENTIAL/PLATELET
BASOS PCT: 1 %
Basophils Absolute: 0.1 10*3/uL (ref 0.0–0.1)
EOS ABS: 0.2 10*3/uL (ref 0.0–0.7)
EOS PCT: 1 %
HCT: 36.4 % (ref 36.0–46.0)
HEMOGLOBIN: 11.9 g/dL — AB (ref 12.0–15.0)
Lymphocytes Relative: 20 %
Lymphs Abs: 2.1 10*3/uL (ref 0.7–4.0)
MCH: 32.7 pg (ref 26.0–34.0)
MCHC: 32.7 g/dL (ref 30.0–36.0)
MCV: 100 fL (ref 78.0–100.0)
Monocytes Absolute: 0.5 10*3/uL (ref 0.1–1.0)
Monocytes Relative: 5 %
NEUTROS PCT: 73 %
Neutro Abs: 7.7 10*3/uL (ref 1.7–7.7)
PLATELETS: 446 10*3/uL — AB (ref 150–400)
RBC: 3.64 MIL/uL — ABNORMAL LOW (ref 3.87–5.11)
RDW: 12.2 % (ref 11.5–15.5)
WBC: 10.5 10*3/uL (ref 4.0–10.5)

## 2017-06-17 LAB — COMPREHENSIVE METABOLIC PANEL
ALBUMIN: 3.8 g/dL (ref 3.5–5.0)
ALK PHOS: 70 U/L (ref 38–126)
ALT: 17 U/L (ref 14–54)
ANION GAP: 13 (ref 5–15)
AST: 21 U/L (ref 15–41)
BUN: 19 mg/dL (ref 6–20)
CHLORIDE: 100 mmol/L — AB (ref 101–111)
CO2: 26 mmol/L (ref 22–32)
Calcium: 9.6 mg/dL (ref 8.9–10.3)
Creatinine, Ser: 0.88 mg/dL (ref 0.44–1.00)
GFR calc non Af Amer: >60 mL/min (ref >60)
GLUCOSE: 92 mg/dL (ref 65–99)
POTASSIUM: 4.5 mmol/L (ref 3.5–5.1)
SODIUM: 139 mmol/L (ref 135–145)
Total Bilirubin: 0.5 mg/dL (ref 0.3–1.2)
Total Protein: 6.9 g/dL (ref 6.5–8.1)

## 2017-06-17 LAB — GLUCOSE, CAPILLARY: Glucose-Capillary: 118 mg/dL — ABNORMAL HIGH (ref 65–99)

## 2017-06-17 LAB — SURGICAL PCR SCREEN
MRSA, PCR: NEGATIVE
Staphylococcus aureus: NEGATIVE

## 2017-06-17 LAB — HEMOGLOBIN A1C
HEMOGLOBIN A1C: 6.3 % — AB (ref 4.8–5.6)
MEAN PLASMA GLUCOSE: 134.11 mg/dL

## 2017-06-17 MED ORDER — BUPIVACAINE LIPOSOME 1.3 % IJ SUSP
20.0000 mL | INTRAMUSCULAR | Status: AC
  Administered 2017-06-20: 20 mL
  Filled 2017-06-17: qty 20

## 2017-06-17 MED ORDER — ACETAMINOPHEN 500 MG PO TABS
1000.0000 mg | ORAL_TABLET | Freq: Once | ORAL | Status: AC
  Administered 2017-06-20: 1000 mg via ORAL
  Filled 2017-06-17: qty 2

## 2017-06-17 MED ORDER — TRANEXAMIC ACID 1000 MG/10ML IV SOLN
1000.0000 mg | INTRAVENOUS | Status: AC
  Filled 2017-06-17: qty 10

## 2017-06-17 MED ORDER — GABAPENTIN 300 MG PO CAPS
300.0000 mg | ORAL_CAPSULE | Freq: Once | ORAL | Status: DC
  Filled 2017-06-17: qty 1

## 2017-06-17 MED ORDER — CEFAZOLIN SODIUM-DEXTROSE 2-4 GM/100ML-% IV SOLN
2.0000 g | INTRAVENOUS | Status: DC
  Filled 2017-06-17: qty 100

## 2017-06-17 NOTE — Progress Notes (Signed)
Anesthesia Chart Review:  Pt is a 52 year old female scheduled for excision R total knee arthroplasty with antibiotic spacers on 06/20/2017 with Quintella Reichert, MD.    PMH includes:  HTN, DM, hyperlipidemia, , GERD. Hx alcohol abuse. Current smoker. BMI 37. S/p R foot 3rd ray amputation 10/03/15. S/p R TKA 01/26/13.    Medications include: Augmentin, ASA 81 mg, lisinopril-HCTZ, metformin  BP 123/60   Pulse 81   Resp 18   Ht 5' 0.25" (1.53 m)   Wt 189 lb 9.6 oz (86 kg)   SpO2 97%   BMI 36.72 kg/m   Preoperative labs reviewed.   - HbA1c 6.3, glucose 92  EKG 06/17/17: Sinus rhythm with 1st degree A-V block. Non-specific intra-ventricular conduction block. Cannot rule out Septal infarct , age undetermined  CT chest 09/06/16:  1. Fat density lesion in the left upper lobe is unchanged from 06/08/2016 and consistent with a benign hamartoma. 2. Heterogeneous thyroid nodule, as before. Consider further evaluation with thyroid ultrasound. If patient is clinically hyperthyroid, consider nuclear medicine thyroid uptake and scan. 3. Bilateral adrenal adenomas.  If no changes, I anticipate pt can proceed with surgery as scheduled.   Rica Mast, FNP-BC [facility] Short Stay [facility]/Anesthesiology Phone: [phone] 06/17/2017 4:46 PM

## 2017-06-17 NOTE — Progress Notes (Signed)
PCP is Dr. Devra Dopp Denies seeing a cardiologist. Brittney Sawyer ever having a card cath, stress test, or echo. Denies chest pain, fever, or cough  Reports that she doesn't check her CBG's, doesn't have a meter.

## 2017-06-17 NOTE — Progress Notes (Signed)
Eligah East, FNP called to review EKG.

## 2017-06-20 ENCOUNTER — Inpatient Hospital Stay (HOSPITAL_COMMUNITY)
Admission: RE | Admit: 2017-06-20 | Discharge: 2017-06-22 | DRG: 468 | Disposition: A | Discharge status: Home Health | Payer: Commercial Managed Care - PPO | Source: Ambulatory Visit | Attending: Orthopedic Surgery | Admitting: Orthopedic Surgery

## 2017-06-20 ENCOUNTER — Encounter (HOSPITAL_COMMUNITY)
Admission: RE | Disposition: A | Discharge status: Home Health | Payer: Self-pay | Source: Ambulatory Visit | Attending: Orthopedic Surgery

## 2017-06-20 ENCOUNTER — Inpatient Hospital Stay (HOSPITAL_COMMUNITY): Payer: Commercial Managed Care - PPO | Admitting: Emergency Medicine

## 2017-06-20 ENCOUNTER — Other Ambulatory Visit: Payer: Self-pay

## 2017-06-20 ENCOUNTER — Encounter (HOSPITAL_COMMUNITY): Payer: Self-pay

## 2017-06-20 ENCOUNTER — Inpatient Hospital Stay (HOSPITAL_COMMUNITY): Payer: Commercial Managed Care - PPO | Admitting: Anesthesiology

## 2017-06-20 DIAGNOSIS — I10 Essential (primary) hypertension: Secondary | ICD-10-CM | POA: Diagnosis present

## 2017-06-20 DIAGNOSIS — F329 Major depressive disorder, single episode, unspecified: Secondary | ICD-10-CM | POA: Diagnosis present

## 2017-06-20 DIAGNOSIS — M71161 Other infective bursitis, right knee: Secondary | ICD-10-CM

## 2017-06-20 DIAGNOSIS — F17210 Nicotine dependence, cigarettes, uncomplicated: Secondary | ICD-10-CM | POA: Diagnosis present

## 2017-06-20 DIAGNOSIS — M7051 Other bursitis of knee, right knee: Secondary | ICD-10-CM | POA: Diagnosis present

## 2017-06-20 DIAGNOSIS — T8453XS Infection and inflammatory reaction due to internal right knee prosthesis, sequela: Secondary | ICD-10-CM | POA: Diagnosis not present

## 2017-06-20 DIAGNOSIS — E669 Obesity, unspecified: Secondary | ICD-10-CM | POA: Diagnosis present

## 2017-06-20 DIAGNOSIS — Z96659 Presence of unspecified artificial knee joint: Secondary | ICD-10-CM

## 2017-06-20 DIAGNOSIS — Z8249 Family history of ischemic heart disease and other diseases of the circulatory system: Secondary | ICD-10-CM

## 2017-06-20 DIAGNOSIS — M24461 Recurrent dislocation, right knee: Secondary | ICD-10-CM | POA: Diagnosis not present

## 2017-06-20 DIAGNOSIS — Z6836 Body mass index (BMI) 36.0-36.9, adult: Secondary | ICD-10-CM | POA: Diagnosis not present

## 2017-06-20 DIAGNOSIS — M25561 Pain in right knee: Secondary | ICD-10-CM | POA: Diagnosis not present

## 2017-06-20 DIAGNOSIS — E785 Hyperlipidemia, unspecified: Secondary | ICD-10-CM | POA: Diagnosis present

## 2017-06-20 DIAGNOSIS — Z8619 Personal history of other infectious and parasitic diseases: Secondary | ICD-10-CM | POA: Diagnosis not present

## 2017-06-20 DIAGNOSIS — Z888 Allergy status to other drugs, medicaments and biological substances status: Secondary | ICD-10-CM | POA: Diagnosis not present

## 2017-06-20 DIAGNOSIS — Z8739 Personal history of other diseases of the musculoskeletal system and connective tissue: Secondary | ICD-10-CM | POA: Diagnosis not present

## 2017-06-20 DIAGNOSIS — Z89421 Acquired absence of other right toe(s): Secondary | ICD-10-CM

## 2017-06-20 DIAGNOSIS — B9689 Other specified bacterial agents as the cause of diseases classified elsewhere: Secondary | ICD-10-CM

## 2017-06-20 DIAGNOSIS — E119 Type 2 diabetes mellitus without complications: Secondary | ICD-10-CM | POA: Diagnosis present

## 2017-06-20 DIAGNOSIS — Y831 Surgical operation with implant of artificial internal device as the cause of abnormal reaction of the patient, or of later complication, without mention of misadventure at the time of the procedure: Secondary | ICD-10-CM | POA: Diagnosis present

## 2017-06-20 DIAGNOSIS — T8450XA Infection and inflammatory reaction due to unspecified internal joint prosthesis, initial encounter: Secondary | ICD-10-CM

## 2017-06-20 DIAGNOSIS — Z8349 Family history of other endocrine, nutritional and metabolic diseases: Secondary | ICD-10-CM

## 2017-06-20 DIAGNOSIS — M1990 Unspecified osteoarthritis, unspecified site: Secondary | ICD-10-CM | POA: Diagnosis not present

## 2017-06-20 DIAGNOSIS — Z981 Arthrodesis status: Secondary | ICD-10-CM

## 2017-06-20 DIAGNOSIS — T8453XA Infection and inflammatory reaction due to internal right knee prosthesis, initial encounter: Secondary | ICD-10-CM | POA: Diagnosis present

## 2017-06-20 DIAGNOSIS — T8453XD Infection and inflammatory reaction due to internal right knee prosthesis, subsequent encounter: Secondary | ICD-10-CM | POA: Diagnosis not present

## 2017-06-20 DIAGNOSIS — Z7984 Long term (current) use of oral hypoglycemic drugs: Secondary | ICD-10-CM | POA: Diagnosis not present

## 2017-06-20 DIAGNOSIS — Z95828 Presence of other vascular implants and grafts: Secondary | ICD-10-CM | POA: Diagnosis not present

## 2017-06-20 HISTORY — PX: EXCISIONAL TOTAL KNEE ARTHROPLASTY WITH ANTIBIOTIC SPACERS: SHX5827

## 2017-06-20 HISTORY — DX: Unspecified osteoarthritis, unspecified site: M19.90

## 2017-06-20 LAB — GLUCOSE, CAPILLARY
GLUCOSE-CAPILLARY: 123 mg/dL — AB (ref 65–99)
Glucose-Capillary: 116 mg/dL — ABNORMAL HIGH (ref 65–99)

## 2017-06-20 SURGERY — REMOVAL, TOTAL ARTHROPLASTY HARDWARE, KNEE, WITH ANTIBIOTIC SPACER INSERTION
Anesthesia: General | Site: Knee | Laterality: Right

## 2017-06-20 MED ORDER — MIDAZOLAM HCL 2 MG/2ML IJ SOLN
2.0000 mg | Freq: Once | INTRAMUSCULAR | Status: AC
  Administered 2017-06-20: 2 mg via INTRAVENOUS

## 2017-06-20 MED ORDER — SODIUM CHLORIDE 0.9 % IJ SOLN
INTRAMUSCULAR | Status: DC | PRN
  Administered 2017-06-20: 20 mL

## 2017-06-20 MED ORDER — BISACODYL 5 MG PO TBEC: 5.0000 mg | DELAYED_RELEASE_TABLET | Freq: Every day | ORAL | Status: DC | PRN

## 2017-06-20 MED ORDER — ONDANSETRON HCL 4 MG/2ML IJ SOLN
INTRAMUSCULAR | Status: AC
  Filled 2017-06-20: qty 2

## 2017-06-20 MED ORDER — FLEET ENEMA 7-19 GM/118ML RE ENEM: 1.0000 | ENEMA | Freq: Once | RECTAL | Status: DC | PRN

## 2017-06-20 MED ORDER — ONDANSETRON HCL 4 MG PO TABS: 4.0000 mg | ORAL_TABLET | Freq: Four times a day (QID) | ORAL | Status: DC | PRN

## 2017-06-20 MED ORDER — PHENOL 1.4 % MT LIQD: 1.0000 | OROMUCOSAL | Status: DC | PRN

## 2017-06-20 MED ORDER — ACETAMINOPHEN 325 MG PO TABS: 650.0000 mg | ORAL_TABLET | ORAL | Status: DC | PRN

## 2017-06-20 MED ORDER — BUPIVACAINE-EPINEPHRINE (PF) 0.5% -1:200000 IJ SOLN
INTRAMUSCULAR | Status: AC
  Filled 2017-06-20: qty 30

## 2017-06-20 MED ORDER — ACETAMINOPHEN 650 MG RE SUPP: 650.0000 mg | RECTAL | Status: DC | PRN

## 2017-06-20 MED ORDER — BUPIVACAINE-EPINEPHRINE 0.5% -1:200000 IJ SOLN
INTRAMUSCULAR | Status: DC | PRN
  Administered 2017-06-20: 20 mL

## 2017-06-20 MED ORDER — PROPOFOL 10 MG/ML IV BOLUS
INTRAVENOUS | Status: AC
  Filled 2017-06-20: qty 40

## 2017-06-20 MED ORDER — ROPIVACAINE HCL 7.5 MG/ML IJ SOLN
INTRAMUSCULAR | Status: DC | PRN
  Administered 2017-06-20: 30 mL via PERINEURAL
  Administered 2017-06-20: 20 mL via PERINEURAL

## 2017-06-20 MED ORDER — KETAMINE HCL 10 MG/ML IJ SOLN
INTRAMUSCULAR | Status: DC | PRN
  Administered 2017-06-20: 10 mg via INTRAVENOUS
  Administered 2017-06-20: 40 mg via INTRAVENOUS

## 2017-06-20 MED ORDER — DULOXETINE HCL 60 MG PO CPEP
60.0000 mg | ORAL_CAPSULE | Freq: Every day | ORAL | Status: DC
  Administered 2017-06-21 – 2017-06-22 (×2): 60 mg via ORAL
  Filled 2017-06-20 (×2): qty 1

## 2017-06-20 MED ORDER — METFORMIN HCL 500 MG PO TABS
500.0000 mg | ORAL_TABLET | Freq: Every day | ORAL | Status: DC
  Administered 2017-06-20 – 2017-06-21 (×2): 500 mg via ORAL
  Filled 2017-06-20 (×2): qty 1

## 2017-06-20 MED ORDER — LISINOPRIL-HYDROCHLOROTHIAZIDE 20-25 MG PO TABS: 1.0000 | ORAL_TABLET | Freq: Every day | ORAL | Status: DC

## 2017-06-20 MED ORDER — CEFAZOLIN SODIUM-DEXTROSE 2-4 GM/100ML-% IV SOLN
2.0000 g | Freq: Four times a day (QID) | INTRAVENOUS | Status: AC
  Administered 2017-06-20 – 2017-06-21 (×2): 2 g via INTRAVENOUS
  Filled 2017-06-20 (×2): qty 100

## 2017-06-20 MED ORDER — METOCLOPRAMIDE HCL 5 MG/ML IJ SOLN: 5.0000 mg | Freq: Three times a day (TID) | INTRAMUSCULAR | Status: DC | PRN

## 2017-06-20 MED ORDER — MIDAZOLAM HCL 2 MG/2ML IJ SOLN: 2.0000 mg | Freq: Once | INTRAMUSCULAR | Status: DC

## 2017-06-20 MED ORDER — TRANEXAMIC ACID 1000 MG/10ML IV SOLN
1000.0000 mg | INTRAVENOUS | Status: AC
  Administered 2017-06-20: 1000 mg via INTRAVENOUS
  Filled 2017-06-20: qty 1100

## 2017-06-20 MED ORDER — PROPOFOL 500 MG/50ML IV EMUL
INTRAVENOUS | Status: DC | PRN
  Administered 2017-06-20: 50 ug/kg/min via INTRAVENOUS

## 2017-06-20 MED ORDER — PROPOFOL 10 MG/ML IV BOLUS
INTRAVENOUS | Status: DC | PRN
  Administered 2017-06-20: 120 mg via INTRAVENOUS

## 2017-06-20 MED ORDER — PHENYLEPHRINE HCL 10 MG/ML IJ SOLN
INTRAVENOUS | Status: DC | PRN
  Administered 2017-06-20: 50 ug/min via INTRAVENOUS

## 2017-06-20 MED ORDER — LACTATED RINGERS IV SOLN
INTRAVENOUS | Status: DC | PRN
  Administered 2017-06-20 (×2): via INTRAVENOUS

## 2017-06-20 MED ORDER — DEXMEDETOMIDINE HCL IN NACL 400 MCG/100ML IV SOLN
0.4000 ug/kg/h | INTRAVENOUS | Status: AC
  Administered 2017-06-20: 1 ug/kg/h via INTRAVENOUS
  Filled 2017-06-20: qty 100

## 2017-06-20 MED ORDER — ZOLPIDEM TARTRATE 5 MG PO TABS: 5.0000 mg | ORAL_TABLET | Freq: Every evening | ORAL | Status: DC | PRN

## 2017-06-20 MED ORDER — MIDAZOLAM HCL 2 MG/2ML IJ SOLN
INTRAMUSCULAR | Status: AC
  Administered 2017-06-20: 2 mg via INTRAVENOUS
  Filled 2017-06-20: qty 2

## 2017-06-20 MED ORDER — HYDROMORPHONE HCL 1 MG/ML IJ SOLN
1.0000 mg | INTRAMUSCULAR | Status: DC | PRN
  Administered 2017-06-20 – 2017-06-22 (×13): 1 mg via INTRAVENOUS
  Filled 2017-06-20 (×13): qty 1

## 2017-06-20 MED ORDER — HYDROCODONE-ACETAMINOPHEN 7.5-325 MG PO TABS
1.0000 | ORAL_TABLET | Freq: Four times a day (QID) | ORAL | Status: DC
  Administered 2017-06-20 – 2017-06-22 (×7): 1 via ORAL
  Filled 2017-06-20 (×7): qty 1

## 2017-06-20 MED ORDER — ONDANSETRON HCL 4 MG/2ML IJ SOLN
INTRAMUSCULAR | Status: DC | PRN
  Administered 2017-06-20: 4 mg via INTRAVENOUS

## 2017-06-20 MED ORDER — LIDOCAINE 2% (20 MG/ML) 5 ML SYRINGE
INTRAMUSCULAR | Status: AC
  Filled 2017-06-20: qty 5

## 2017-06-20 MED ORDER — DOCUSATE SODIUM 100 MG PO CAPS
100.0000 mg | ORAL_CAPSULE | Freq: Two times a day (BID) | ORAL | Status: DC
  Administered 2017-06-20 – 2017-06-22 (×4): 100 mg via ORAL
  Filled 2017-06-20 (×4): qty 1

## 2017-06-20 MED ORDER — HYDROMORPHONE HCL 1 MG/ML IJ SOLN
INTRAMUSCULAR | Status: AC
  Administered 2017-06-20: 0.5 mg via INTRAVENOUS
  Filled 2017-06-20: qty 1

## 2017-06-20 MED ORDER — METHOCARBAMOL 500 MG PO TABS
500.0000 mg | ORAL_TABLET | Freq: Four times a day (QID) | ORAL | Status: DC | PRN
  Administered 2017-06-20 – 2017-06-21 (×5): 500 mg via ORAL
  Filled 2017-06-20 (×4): qty 1

## 2017-06-20 MED ORDER — METOCLOPRAMIDE HCL 5 MG PO TABS: 5.0000 mg | ORAL_TABLET | Freq: Three times a day (TID) | ORAL | Status: DC | PRN

## 2017-06-20 MED ORDER — METHOCARBAMOL 1000 MG/10ML IJ SOLN: 500.0000 mg | Freq: Four times a day (QID) | INTRAVENOUS | Status: DC | PRN

## 2017-06-20 MED ORDER — LACTATED RINGERS IV SOLN
INTRAVENOUS | Status: DC
  Administered 2017-06-20: 11:00:00 via INTRAVENOUS

## 2017-06-20 MED ORDER — HYDROMORPHONE HCL 1 MG/ML IJ SOLN
0.2500 mg | INTRAMUSCULAR | Status: DC | PRN
  Administered 2017-06-20 (×4): 0.5 mg via INTRAVENOUS

## 2017-06-20 MED ORDER — FENTANYL CITRATE (PF) 100 MCG/2ML IJ SOLN
INTRAMUSCULAR | Status: AC
  Filled 2017-06-20: qty 2

## 2017-06-20 MED ORDER — DEXAMETHASONE SODIUM PHOSPHATE 10 MG/ML IJ SOLN
10.0000 mg | Freq: Once | INTRAMUSCULAR | Status: AC
  Administered 2017-06-21: 10 mg via INTRAVENOUS
  Filled 2017-06-20: qty 1

## 2017-06-20 MED ORDER — KETAMINE HCL-SODIUM CHLORIDE 100-0.9 MG/10ML-% IV SOSY
PREFILLED_SYRINGE | INTRAVENOUS | Status: AC
Start: 2017-06-20 — End: 2017-06-20
  Filled 2017-06-20: qty 10

## 2017-06-20 MED ORDER — PHENYLEPHRINE 40 MCG/ML (10ML) SYRINGE FOR IV PUSH (FOR BLOOD PRESSURE SUPPORT)
PREFILLED_SYRINGE | INTRAVENOUS | Status: AC
  Filled 2017-06-20: qty 20

## 2017-06-20 MED ORDER — FENTANYL CITRATE (PF) 100 MCG/2ML IJ SOLN: 100.0000 ug | Freq: Once | INTRAMUSCULAR | Status: DC

## 2017-06-20 MED ORDER — ACETAMINOPHEN 500 MG PO TABS
1000.0000 mg | ORAL_TABLET | Freq: Four times a day (QID) | ORAL | Status: AC
  Administered 2017-06-20 – 2017-06-21 (×3): 1000 mg via ORAL
  Filled 2017-06-20 (×3): qty 2

## 2017-06-20 MED ORDER — SENNOSIDES-DOCUSATE SODIUM 8.6-50 MG PO TABS: 1.0000 | ORAL_TABLET | Freq: Every evening | ORAL | Status: DC | PRN

## 2017-06-20 MED ORDER — MIDAZOLAM HCL 2 MG/2ML IJ SOLN
INTRAMUSCULAR | Status: AC
  Filled 2017-06-20: qty 2

## 2017-06-20 MED ORDER — SODIUM CHLORIDE 0.9 % IR SOLN
Status: DC | PRN
  Administered 2017-06-20: 3000 mL

## 2017-06-20 MED ORDER — DIPHENHYDRAMINE HCL 12.5 MG/5ML PO ELIX: 12.5000 mg | ORAL_SOLUTION | ORAL | Status: DC | PRN

## 2017-06-20 MED ORDER — POVIDONE-IODINE 10 % EX SWAB: 2.0000 "application " | Freq: Once | CUTANEOUS | Status: DC

## 2017-06-20 MED ORDER — OXYCODONE HCL 5 MG PO TABS
5.0000 mg | ORAL_TABLET | ORAL | Status: DC | PRN
  Administered 2017-06-20 – 2017-06-22 (×9): 5 mg via ORAL
  Filled 2017-06-20 (×9): qty 1

## 2017-06-20 MED ORDER — LIDOCAINE HCL (CARDIAC) 20 MG/ML IV SOLN: INTRAVENOUS | Status: DC | PRN

## 2017-06-20 MED ORDER — HYDROCHLOROTHIAZIDE 25 MG PO TABS
25.0000 mg | ORAL_TABLET | Freq: Every day | ORAL | Status: DC
  Administered 2017-06-20 – 2017-06-22 (×3): 25 mg via ORAL
  Filled 2017-06-20 (×3): qty 1

## 2017-06-20 MED ORDER — FENTANYL CITRATE (PF) 250 MCG/5ML IJ SOLN
INTRAMUSCULAR | Status: AC
  Filled 2017-06-20: qty 5

## 2017-06-20 MED ORDER — TRAZODONE HCL 100 MG PO TABS
300.0000 mg | ORAL_TABLET | Freq: Every day | ORAL | Status: DC
  Administered 2017-06-20 – 2017-06-21 (×2): 300 mg via ORAL
  Filled 2017-06-20 (×2): qty 3

## 2017-06-20 MED ORDER — ALUM & MAG HYDROXIDE-SIMETH 200-200-20 MG/5ML PO SUSP: 30.0000 mL | ORAL | Status: DC | PRN

## 2017-06-20 MED ORDER — MENTHOL 3 MG MT LOZG: 1.0000 | LOZENGE | OROMUCOSAL | Status: DC | PRN

## 2017-06-20 MED ORDER — GABAPENTIN 400 MG PO CAPS
800.0000 mg | ORAL_CAPSULE | Freq: Four times a day (QID) | ORAL | Status: DC
  Administered 2017-06-20 – 2017-06-22 (×8): 800 mg via ORAL
  Filled 2017-06-20 (×8): qty 2

## 2017-06-20 MED ORDER — CHLORHEXIDINE GLUCONATE 4 % EX LIQD: 60.0000 mL | Freq: Once | CUTANEOUS | Status: DC

## 2017-06-20 MED ORDER — LISINOPRIL 20 MG PO TABS
20.0000 mg | ORAL_TABLET | Freq: Every day | ORAL | Status: DC
  Administered 2017-06-20 – 2017-06-22 (×3): 20 mg via ORAL
  Filled 2017-06-20 (×3): qty 1

## 2017-06-20 MED ORDER — TRAZODONE HCL 100 MG PO TABS
100.0000 mg | ORAL_TABLET | Freq: Three times a day (TID) | ORAL | Status: DC
  Administered 2017-06-20: 100 mg via ORAL
  Filled 2017-06-20: qty 1

## 2017-06-20 MED ORDER — FENTANYL CITRATE (PF) 100 MCG/2ML IJ SOLN
INTRAMUSCULAR | Status: DC | PRN
  Administered 2017-06-20 (×2): 50 ug via INTRAVENOUS

## 2017-06-20 MED ORDER — ASPIRIN EC 325 MG PO TBEC
325.0000 mg | DELAYED_RELEASE_TABLET | Freq: Two times a day (BID) | ORAL | Status: DC
  Administered 2017-06-20 – 2017-06-22 (×4): 325 mg via ORAL
  Filled 2017-06-20 (×4): qty 1

## 2017-06-20 MED ORDER — ONDANSETRON HCL 4 MG/2ML IJ SOLN: 4.0000 mg | Freq: Four times a day (QID) | INTRAMUSCULAR | Status: DC | PRN

## 2017-06-20 MED ORDER — TRANEXAMIC ACID 1000 MG/10ML IV SOLN
1000.0000 mg | Freq: Once | INTRAVENOUS | Status: AC
  Administered 2017-06-20: 1000 mg via INTRAVENOUS
  Filled 2017-06-20: qty 10

## 2017-06-20 SURGICAL SUPPLY — 58 items
BANDAGE ELASTIC 6 VELCRO ST LF (GAUZE/BANDAGES/DRESSINGS) ×2 IMPLANT
BANDAGE ESMARK 6X9 LF (GAUZE/BANDAGES/DRESSINGS) ×1 IMPLANT
BLADE SAW SAG 9X24 (BLADE) ×2 IMPLANT
BNDG CMPR 9X6 STRL LF SNTH (GAUZE/BANDAGES/DRESSINGS) ×1
BNDG ESMARK 6X9 LF (GAUZE/BANDAGES/DRESSINGS) ×2
BOWL SMART MIX CTS (DISPOSABLE) ×2 IMPLANT
CEMENT BONE REFOBACIN R1X40 US (Cement) ×12 IMPLANT
CONT SPEC 4OZ CLIKSEAL STRL BL (MISCELLANEOUS) ×2 IMPLANT
COVER SURGICAL LIGHT HANDLE (MISCELLANEOUS) ×2 IMPLANT
CUFF TOURNIQUET SINGLE 34IN LL (TOURNIQUET CUFF) ×2 IMPLANT
DRAPE HALF SHEET 40X57 (DRAPES) ×4 IMPLANT
DRAPE IMP U-DRAPE 54X76 (DRAPES) ×2 IMPLANT
DRAPE INCISE IOBAN 66X45 STRL (DRAPES) ×4 IMPLANT
DRAPE U-SHAPE 47X51 STRL (DRAPES) ×2 IMPLANT
DRSG ADAPTIC 3X8 NADH LF (GAUZE/BANDAGES/DRESSINGS) ×2 IMPLANT
DRSG AQUACEL AG ADV 3.5X10 (GAUZE/BANDAGES/DRESSINGS) ×2 IMPLANT
DURAPREP 26ML APPLICATOR (WOUND CARE) ×4 IMPLANT
ELECT REM PT RETURN 9FT ADLT (ELECTROSURGICAL) ×2
ELECTRODE REM PT RTRN 9FT ADLT (ELECTROSURGICAL) ×1 IMPLANT
EVACUATOR 1/8 PVC DRAIN (DRAIN) IMPLANT
GLOVE BIOGEL PI IND STRL 8.5 (GLOVE) ×1 IMPLANT
GLOVE BIOGEL PI INDICATOR 8.5 (GLOVE) ×1
GLOVE BIOGEL PI ORTHO PRO SZ8 (GLOVE) ×1
GLOVE PI ORTHO PRO STRL SZ8 (GLOVE) ×1 IMPLANT
GLOVE SURG ORTHO 8.0 STRL STRW (GLOVE) ×4 IMPLANT
GOWN STRL REUS W/ TWL LRG LVL3 (GOWN DISPOSABLE) ×2 IMPLANT
GOWN STRL REUS W/ TWL XL LVL3 (GOWN DISPOSABLE) ×2 IMPLANT
GOWN STRL REUS W/TWL 2XL LVL3 (GOWN DISPOSABLE) ×2 IMPLANT
GOWN STRL REUS W/TWL LRG LVL3 (GOWN DISPOSABLE) ×4
GOWN STRL REUS W/TWL XL LVL3 (GOWN DISPOSABLE) ×4
HANDPIECE INTERPULSE COAX TIP (DISPOSABLE) ×2
HOOD PEEL AWAY FACE SHEILD DIS (HOOD) ×6 IMPLANT
IMMOBILIZER KNEE 22 UNIV (SOFTGOODS) ×2 IMPLANT
KIT BASIN OR (CUSTOM PROCEDURE TRAY) ×2 IMPLANT
KIT ROOM TURNOVER OR (KITS) ×2 IMPLANT
MANIFOLD NEPTUNE II (INSTRUMENTS) ×2 IMPLANT
MOLD CEMENT FEMORAL 60MM (DISPOSABLE) ×2 IMPLANT
MOLD CEMENT TIBIAL 65MM (DISPOSABLE) ×2 IMPLANT
NS IRRIG 1000ML POUR BTL (IV SOLUTION) ×2 IMPLANT
PACK TOTAL JOINT (CUSTOM PROCEDURE TRAY) ×2 IMPLANT
PACK UNIVERSAL I (CUSTOM PROCEDURE TRAY) ×2 IMPLANT
PAD ARMBOARD 7.5X6 YLW CONV (MISCELLANEOUS) ×4 IMPLANT
SET HNDPC FAN SPRY TIP SCT (DISPOSABLE) ×1 IMPLANT
STAPLER VISISTAT 35W (STAPLE) ×2 IMPLANT
SUCTION FRAZIER HANDLE 10FR (MISCELLANEOUS) ×1
SUCTION TUBE FRAZIER 10FR DISP (MISCELLANEOUS) ×1 IMPLANT
SUT PDS AB 1 CTXB1 36 (SUTURE) ×2 IMPLANT
SUT VIC AB 0 CTB1 27 (SUTURE) ×4 IMPLANT
SUT VIC AB 1 CT1 27 (SUTURE) ×2
SUT VIC AB 1 CT1 27XBRD ANBCTR (SUTURE) ×1 IMPLANT
SUT VIC AB 2-0 CT1 27 (SUTURE) ×4
SUT VIC AB 2-0 CT1 TAPERPNT 27 (SUTURE) ×2 IMPLANT
SUT VLOC 180 0 24IN GS25 (SUTURE) ×2 IMPLANT
SYR 10ML LL (SYRINGE) IMPLANT
SYSTEM VACUUM CEMENT MIXING (MISCELLANEOUS) ×4 IMPLANT
TOWEL OR 17X24 6PK STRL BLUE (TOWEL DISPOSABLE) ×2 IMPLANT
TOWEL OR 17X26 10 PK STRL BLUE (TOWEL DISPOSABLE) ×2 IMPLANT
WATER STERILE IRR 1000ML POUR (IV SOLUTION) ×6 IMPLANT

## 2017-06-20 NOTE — Anesthesia Procedure Notes (Signed)
Anesthesia Regional Block: Adductor canal block   Pre-Anesthetic Checklist: ,, timeout performed, Correct Patient, Correct Site, Correct Laterality, Correct Procedure, Correct Position, site marked, Risks and benefits discussed, pre-op evaluation,  At surgeon's request and post-op pain management  Laterality: Right  Prep: Maximum Sterile Barrier Precautions used, chloraprep       Needles:  Injection technique: Single-shot  Needle Type: Echogenic Stimulator Needle     Needle Length: 9cm  Needle Gauge: 21     Additional Needles:   Procedures:,,,, ultrasound used (permanent image in chart),,,,  Narrative:  Start time: 06/20/2017 11:19 AM End time: 06/20/2017 11:29 AM Injection made incrementally with aspirations every 5 mL.  Performed by: Personally  Anesthesiologist: Gaynelle Adu, MD  Additional Notes: 2% Lidocaine skin wheel.

## 2017-06-20 NOTE — Anesthesia Preprocedure Evaluation (Addendum)
Anesthesia Evaluation  Patient identified by MRN, date of birth, ID band Patient awake    Reviewed: Allergy & Precautions, H&P , NPO status , Patient's Chart, lab work & pertinent test results  Airway Mallampati: III  TM Distance: >3 FB Neck ROM: Full    Dental no notable dental hx. (+) Teeth Intact, Dental Advisory Given   Pulmonary Current Smoker,    Pulmonary exam normal breath sounds clear to auscultation       Cardiovascular hypertension, Pt. on medications  Rhythm:Regular Rate:Normal     Neuro/Psych Anxiety Depression negative neurological ROS     GI/Hepatic Neg liver ROS, GERD  Medicated and Controlled,  Endo/Other  diabetes, Type 2, Oral Hypoglycemic AgentsMorbid obesity  Renal/GU negative Renal ROS  negative genitourinary   Musculoskeletal   Abdominal   Peds  Hematology negative hematology ROS (+)   Anesthesia Other Findings   Reproductive/Obstetrics negative OB ROS                            Anesthesia Physical Anesthesia Plan  ASA: III  Anesthesia Plan: General   Post-op Pain Management:  Regional for Post-op pain   Induction: Intravenous  PONV Risk Score and Plan: 2 and Propofol infusion, Ondansetron and Midazolam  Airway Management Planned: LMA  Additional Equipment:   Intra-op Plan:   Post-operative Plan:   Informed Consent: I have reviewed the patients History and Physical, chart, labs and discussed the procedure including the risks, benefits and alternatives for the proposed anesthesia with the patient or authorized representative who has indicated his/her understanding and acceptance.   Dental advisory given  Plan Discussed with: CRNA  Anesthesia Plan Comments:        Anesthesia Quick Evaluation

## 2017-06-20 NOTE — Transfer of Care (Signed)
Immediate Anesthesia Transfer of Care Note  Patient: Brittney Sawyer  Procedure(s) Performed: EXCISION RIGHT TOTAL KNEE ARTHROPLASTY WITH ANTIBIOTIC SPACERS (Right Knee)  Patient Location: PACU  Anesthesia Type:GA combined with regional for post-op pain  Level of Consciousness: awake and alert   Airway & Oxygen Therapy: Patient Spontanous Breathing and Patient connected to nasal cannula oxygen  Post-op Assessment: Report given to RN and Post -op Vital signs reviewed and stable  Post vital signs: Reviewed and stable  Last Vitals:  Vitals:   06/20/17 1134 06/20/17 1501  BP: (!) 113/58   Pulse: 72   Resp: 14   Temp:  36.5 C  SpO2: 97%     Last Pain:  Vitals:   06/20/17 1501  TempSrc:   PainSc: Asleep      Patients Stated Pain Goal: 2 (06/20/17 1036)  Complications: No apparent anesthesia complications

## 2017-06-20 NOTE — Evaluation (Addendum)
Physical Therapy Evaluation Patient Details Name: Brittney Sawyer MRN: [id number] DOB: 1966-04-04 Today's Date: 06/20/2017   History of Present Illness  Pt is a 52 y/o female s/p R exicision of TKA with antibiotic spacer placement. PMH inlcudes HTN, R TKA, R third toe amputation, back surgery, DM, and smoker.  Clinical Impression  Pt s/p surgery above with deficits below. Pt limited by pain this session, performing stand pivot X 2 with min guard assist and RW. Reviewed knee precautions with pt. Anticipate pt will progress well. Will continue to follow acutely to maximize functional mobility independence and safety.     Follow Up Recommendations DC plan and follow up therapy as arranged by surgeon;Supervision for mobility/OOB    Equipment Recommendations  3in1 (PT)    Recommendations for Other Services       Precautions / Restrictions Precautions Precautions: Knee;Other (comment) Precaution Booklet Issued: No Precaution Comments: Reviewed knee precautions. Did not perform knee ROM with pt as not ROM orders.  Required Braces or Orthoses: Knee Immobilizer - Right Knee Immobilizer - Right: Other (comment)(when weightbearing ) Restrictions Weight Bearing Restrictions: Yes RLE Weight Bearing: Weight bearing as tolerated(when in KI )      Mobility  Bed Mobility Overal bed mobility: Needs Assistance Bed Mobility: Supine to Sit;Sit to Supine     Supine to sit: Min assist Sit to supine: Min assist   General bed mobility comments: Min A for RLE management. Required increased time and use of bed rails.   Transfers Overall transfer level: Needs assistance Equipment used: Rolling walker (2 wheeled) Transfers: Sit to/from [employer] Sit to Stand: Min guard Stand pivot transfers: Min guard       General transfer comment: Min guard for safety to stand and during stand pivot to [facility] and then back to bed. Pt requesting to go back to bed after completing toileting.  Further distance limited by pain. Verbal cues for sequencing with RW.   Ambulation/Gait             General Gait Details: Deferred  Stairs            Wheelchair Mobility    Modified Rankin (Stroke Patients Only)       Balance Overall balance assessment: Needs assistance Sitting-balance support: No upper extremity supported;Feet supported Sitting balance-Leahy Scale: Good     Standing balance support: Bilateral upper extremity supported;During functional activity Standing balance-Leahy Scale: Poor Standing balance comment: Reliant on BUE support.                              Pertinent Vitals/Pain Pain Assessment: 0-10 Pain Score: 3  Pain Location: R knee  Pain Descriptors / Indicators: Aching;Operative site guarding Pain Intervention(s): Limited activity within patient's tolerance;Monitored during session;Repositioned    Home Living Family/patient expects to be discharged to:: Private residence Living Arrangements: Spouse/significant other Available Help at Discharge: Family;Available 24 hours/day Type of Home: House Home Access: Stairs to enter Entrance Stairs-Rails: Doctor, general practice of Steps: 5 Home Layout: Two level;Able to live on main level with bedroom/bathroom Home Equipment: Dan Humphreys - 2 wheels      Prior Function Level of Independence: Independent               Hand Dominance   Dominant Hand: Right    Extremity/Trunk Assessment   Upper Extremity Assessment Upper Extremity Assessment: Defer to OT evaluation    Lower Extremity Assessment Lower Extremity Assessment: RLE deficits/detail RLE  Deficits / Details: Sensory in tact. Deficits consistent with post op pain and weakness.     Cervical / Trunk Assessment Cervical / Trunk Assessment: Normal  Communication   Communication: No difficulties  Cognition Arousal/Alertness: Awake/alert Behavior During Therapy: WFL for tasks assessed/performed Overall  Cognitive Status: Within Functional Limits for tasks assessed                                        General Comments      Exercises Total Joint Exercises Ankle Circles/Pumps: AROM;Both;20 reps   Assessment/Plan    PT Assessment Patient needs continued PT services  PT Problem List Decreased strength;Decreased range of motion;Decreased activity tolerance;Decreased balance;Decreased mobility;Decreased knowledge of use of DME;Decreased knowledge of precautions;Pain       PT Treatment Interventions DME instruction;Gait training;Functional mobility training;Stair training;Therapeutic activities;Therapeutic exercise;Balance training;Neuromuscular re-education;Patient/family education    PT Goals (Current goals can be found in the Care Plan section)  Acute Rehab PT Goals Patient Stated Goal: to go home  PT Goal Formulation: With patient Time For Goal Achievement: 06/27/17 Potential to Achieve Goals: Good    Frequency 7X/week   Barriers to discharge        Co-evaluation               AM-PAC PT "6 Clicks" Daily Activity  Outcome Measure Difficulty turning over in bed (including adjusting bedclothes, sheets and blankets)?: A Little Difficulty moving from lying on back to sitting on the side of the bed? : Unable Difficulty sitting down on and standing up from a chair with arms (e.g., wheelchair, bedside commode, etc,.)?: Unable Help needed moving to and from a bed to chair (including a wheelchair)?: A Little Help needed walking in hospital room?: A Lot Help needed climbing 3-5 steps with a railing? : A Lot 6 Click Score: 12    End of Session Equipment Utilized During Treatment: Gait belt;Right knee immobilizer Activity Tolerance: Patient limited by pain Patient left: in bed;with call bell/phone within reach Nurse Communication: Mobility status PT Visit Diagnosis: Other abnormalities of gait and mobility (R26.89);Pain Pain - Right/Left: Right Pain - part  of body: Knee    Time: 6122-4497 PT Time Calculation (min) (ACUTE ONLY): 26 min   Charges:   PT Evaluation $PT Eval Low Complexity: 1 Low PT Treatments $Therapeutic Activity: 8-22 mins   PT G Codes:        Gladys Damme, PT, DPT  Acute Rehabilitation Services  Pager: [phone]   Lehman Prom 06/20/2017, 6:56 PM

## 2017-06-20 NOTE — Progress Notes (Signed)
Orthopedic Tech Progress Note Patient Details:  Brittney Sawyer [birth year] [id number]  Ortho Devices Type of Ortho Device: Bone foam zero knee Ortho Device/Splint Location: RLE Ortho Device/Splint Interventions: Casandra Doffing 06/20/2017, 3:25 PM

## 2017-06-20 NOTE — H&P (Signed)
Brittney Sawyer MRN:  [id number] DOB/SEX:  1965/07/30/female  CHIEF COMPLAINT:  Painful right Knee  HISTORY: Patient is a 52 y.o. female presented with a history of pain in the right knee. Onset of symptoms was gradual starting a few months ago with gradually worsening course since that time. Prior procedures on the knee include arthroplasty. Patient has been treated conservatively with over-the-counter NSAIDs and activity modification. Patient currently rates pain in the knee at 10 out of 10 with activity. There is pain at night.  PAST MEDICAL HISTORY: Patient Active Problem List   Diagnosis Date Noted  . Septic joint (HCC) 06/09/2017  . History of tobacco use 08/31/2016  . Diabetes mellitus type 2, controlled (HCC) 05/20/2014  . Right knee DJD 01/27/2013  . Vaginal candidiasis 01/27/2013  . Metabolic syndrome 12/20/2012  . Obesity (BMI 35.0-39.9 without comorbidity) 12/19/2012  . History of substance use disorder 01/07/2012  . Menopause 11/25/2011  . DDD (degenerative disc disease), lumbar 11/25/2011  . HTN (hypertension) 10/21/2011  . Mood disorder (HCC) 10/21/2011  . Chronic pain 10/21/2011   Past Medical History:  Diagnosis Date  . Anxiety   . Depression   . Diabetes mellitus type 2, controlled (HCC) 05/20/2014  . GERD (gastroesophageal reflux disease)    heartburn  . Hyperlipidemia   . Hypertension   . Neuropathy    post back surgery  . Pneumonia   . Right knee DJD 01/27/2013  . Substance abuse (HCC)    Alcohol - sober since 2006   Past Surgical History:  Procedure Laterality Date  . ABDOMINAL HYSTERECTOMY    . AMPUTATION Right 10/03/2015   Procedure: Right Foot 3rd Ray Amputation;  Surgeon: Nadara Mustard, MD;  Location: [facility] OR;  Service: Orthopedics;  Laterality: Right;  . BACK SURGERY  2009, July and Sept   L4, L5 Fusion,  second surgery L2-L% and S1  . BUNIONECTOMY    . JOINT REPLACEMENT    . SHOULDER SURGERY Left   . SPINE SURGERY    . TOTAL KNEE ARTHROPLASTY  Right 01/26/2013   Procedure: TOTAL KNEE ARTHROPLASTY;  Surgeon: Thera Flake., MD;  Location: [facility] OR;  Service: Orthopedics;  Laterality: Right;     MEDICATIONS:   No medications prior to admission.    ALLERGIES:   Allergies  Allergen Reactions  . Celebrex [Celecoxib] Swelling    UNSPECIFIED AREA  . Wellbutrin [Bupropion] Rash    REVIEW OF SYSTEMS:  A comprehensive review of systems was negative except for: Musculoskeletal: positive for arthralgias, bone pain and stiff joints   FAMILY HISTORY:   Family History  Problem Relation Age of Onset  . Thyroid disease Mother   . Hypertension Father   . Anxiety disorder Sister   . Cancer Paternal Grandmother        prostate    SOCIAL HISTORY:   Social History   Tobacco Use  . Smoking status: Current Every Day Smoker    Packs/day: 0.50    Years: 10.00    Pack years: 5.00    Types: Cigarettes    Last attempt to quit: 03/10/2016    Years since quitting: 1.2  . Smokeless tobacco: Never Used  Substance Use Topics  . 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     EXAMINATION:  Vital signs in last 24 hours:    There were no vitals taken for this visit.  General  Appearance:    Alert, cooperative, no distress, appears stated age  Head:    Normocephalic, without obvious abnormality, atraumatic  Eyes:    PERRL, conjunctiva/corneas clear, EOM's intact, fundi    benign, both eyes  Ears:    Normal TM's and external ear canals, both ears  Nose:   Nares normal, septum midline, mucosa normal, no drainage    or sinus tenderness  Throat:   Lips, mucosa, and tongue normal; teeth and gums normal  Neck:   Supple, symmetrical, trachea midline, no adenopathy;    thyroid:  no enlargement/tenderness/nodules; no carotid   bruit or JVD  Back:     Symmetric, no curvature, ROM normal, no CVA tenderness  Lungs:     Clear to auscultation bilaterally, respirations unlabored   Chest Wall:    No tenderness or deformity   Heart:    Regular rate and rhythm, S1 and S2 normal, no murmur, rub   or gallop  Breast Exam:    No tenderness, masses, or nipple abnormality  Abdomen:     Soft, non-tender, bowel sounds active all four quadrants,    no masses, no organomegaly  Genitalia:    Normal female without lesion, discharge or tenderness  Rectal:    Normal tone, no masses or tenderness;   guaiac negative stool  Extremities:   Extremities normal, atraumatic, no cyanosis or edema  Pulses:   2+ and symmetric all extremities  Skin:   Skin color, texture, turgor normal, no rashes or lesions  Lymph nodes:   Cervical, supraclavicular, and axillary nodes normal  Neurologic:   CNII-XII intact, normal strength, sensation and reflexes    throughout    Musculoskeletal:  ROM 0-120, Ligaments intact,  Imaging Review Plain radiographs demonstrate s/p tka of the right knee. The overall alignment is neutral. The bone quality appears to be good for age and reported activity level.  Assessment/Plan:s/p tka right knee, infected tka  The patient history, physical examination and imaging studies are consistent with infected tka of the right knee. The patient has failed conservative treatment.  The clearance notes were reviewed.  After discussion with the patient it was felt that a 2 step Total Knee revision was indicated. The procedure,  risks, and benefits of total knee revision were presented and reviewed. The risks including but not limited to aseptic loosening, infection, blood clots, vascular injury, stiffness, patella tracking problems complications among others were discussed. The patient acknowledged the explanation, agreed to proceed with the plan.  Guy Sandifer 06/20/2017, 9:34 AM

## 2017-06-20 NOTE — Anesthesia Procedure Notes (Signed)
Performed by: Myndi Wamble, CRNA       

## 2017-06-20 NOTE — Anesthesia Procedure Notes (Signed)
Procedure Name: LMA Insertion Date/Time: 06/20/2017 1:05 PM Performed by: Jodell Cipro, CRNA Pre-anesthesia Checklist: Patient identified, Emergency Drugs available, Suction available and Patient being monitored Patient Re-evaluated:Patient Re-evaluated prior to induction Oxygen Delivery Method: Circle System Utilized Preoxygenation: Pre-oxygenation with 100% oxygen Induction Type: IV induction Ventilation: Mask ventilation without difficulty LMA: LMA inserted LMA Size: 4.0 Number of attempts: 1 Airway Equipment and Method: Bite block Placement Confirmation: positive ETCO2 Tube secured with: Tape Dental Injury: Teeth and Oropharynx as per pre-operative assessment

## 2017-06-20 NOTE — Progress Notes (Signed)
Wasted 66ml of Precedex infusion pulled by Dr.Joslin from OR pharm. Witnessed by Henrietta Dine RN

## 2017-06-21 ENCOUNTER — Encounter (HOSPITAL_COMMUNITY): Payer: Self-pay | Admitting: Orthopedic Surgery

## 2017-06-21 DIAGNOSIS — M24461 Recurrent dislocation, right knee: Secondary | ICD-10-CM

## 2017-06-21 DIAGNOSIS — F17210 Nicotine dependence, cigarettes, uncomplicated: Secondary | ICD-10-CM

## 2017-06-21 DIAGNOSIS — Z8739 Personal history of other diseases of the musculoskeletal system and connective tissue: Secondary | ICD-10-CM

## 2017-06-21 DIAGNOSIS — Z8249 Family history of ischemic heart disease and other diseases of the circulatory system: Secondary | ICD-10-CM

## 2017-06-21 DIAGNOSIS — T8453XS Infection and inflammatory reaction due to internal right knee prosthesis, sequela: Secondary | ICD-10-CM

## 2017-06-21 DIAGNOSIS — Z8619 Personal history of other infectious and parasitic diseases: Secondary | ICD-10-CM

## 2017-06-21 DIAGNOSIS — E669 Obesity, unspecified: Secondary | ICD-10-CM

## 2017-06-21 DIAGNOSIS — M71161 Other infective bursitis, right knee: Secondary | ICD-10-CM

## 2017-06-21 DIAGNOSIS — T8450XA Infection and inflammatory reaction due to unspecified internal joint prosthesis, initial encounter: Secondary | ICD-10-CM

## 2017-06-21 DIAGNOSIS — B9689 Other specified bacterial agents as the cause of diseases classified elsewhere: Secondary | ICD-10-CM

## 2017-06-21 DIAGNOSIS — M25561 Pain in right knee: Secondary | ICD-10-CM

## 2017-06-21 LAB — GLUCOSE, CAPILLARY
GLUCOSE-CAPILLARY: 104 mg/dL — AB (ref 65–99)
GLUCOSE-CAPILLARY: 148 mg/dL — AB (ref 65–99)
Glucose-Capillary: 113 mg/dL — ABNORMAL HIGH (ref 65–99)
Glucose-Capillary: 181 mg/dL — ABNORMAL HIGH (ref 65–99)

## 2017-06-21 LAB — CK: CK TOTAL: 93 U/L (ref 38–234)

## 2017-06-21 MED ORDER — SODIUM CHLORIDE 0.9 % IV SOLN
2.0000 g | INTRAVENOUS | Status: DC
  Administered 2017-06-21 – 2017-06-22 (×2): 2 g via INTRAVENOUS
  Filled 2017-06-21 (×3): qty 20

## 2017-06-21 MED ORDER — OXYCODONE HCL 10 MG PO TABS: 10.0000 mg | ORAL_TABLET | ORAL | 0 refills | Status: DC | PRN

## 2017-06-21 MED ORDER — SODIUM CHLORIDE 0.9% FLUSH: 10.0000 mL | INTRAVENOUS | Status: DC | PRN

## 2017-06-21 MED ORDER — HYDROCODONE-ACETAMINOPHEN 7.5-325 MG PO TABS: 1.0000 | ORAL_TABLET | Freq: Four times a day (QID) | ORAL | Status: DC | PRN

## 2017-06-21 MED ORDER — ASPIRIN 325 MG PO TBEC: 325.0000 mg | DELAYED_RELEASE_TABLET | Freq: Two times a day (BID) | ORAL | 0 refills | Status: DC

## 2017-06-21 MED ORDER — METHOCARBAMOL 500 MG PO TABS: 500.0000 mg | ORAL_TABLET | Freq: Four times a day (QID) | ORAL | 0 refills | Status: DC | PRN

## 2017-06-21 MED ORDER — SODIUM CHLORIDE 0.9 % IV SOLN
500.0000 mg | INTRAVENOUS | Status: DC
  Administered 2017-06-21: 500 mg via INTRAVENOUS
  Filled 2017-06-21 (×2): qty 10

## 2017-06-21 NOTE — Consult Note (Addendum)
INFECTIOUS DISEASE ATTENDING ADDENDUM:   Date: 06/21/2017  Patient name: Brittney Sawyer  Medical record number: [id number]  Date of birth: [birth year]   This patient has been seen and discussed with the house staff. Please see the resident's note for complete details. I have reviewed the pertinent HPI, Past medical, surgical, family, social history, ROS, allergies, medications, pertinent laboratory and radiographic data and examined the patient independently.  I concur with their findings with the following additions/corrections:  52 year old lady with complicated surgical history /o DJD with total knee replacement in 2014 with episode of septic joint - first in 2015 treated initially in [place] with polyswap and Dapto/ceftriaxone x6wks then bactrim/cipro for remainder of course for 6mos total abx duration  This last time no informative culture data. Patient had been doing well until about 2 weeks ago when she noticed increased joint pain and swelling; she was seen in ED and had joint aspiration of cloudy fluid - analysis showed 115K WBC, neutrophil predominant; no growth. At the ED visit, she received Vanc and Zosyn (after aspiration) and was sent home with augmentin and doxycycline. At f/u with ortho, she had worsening pain and swelling so she was admitted. She is s/p excision of total right knee with placement of antibiotic spacer.  Exam the patient is fairly ready colored complexion she is complaining of significant pain particular at her operative site.  Operative site dressed  Exam otherwise unremarkable  Cultures are incubating from this surgery but may unfortunately not organism yet again due to the fact that she was on antecedent antibiotics.  We will try to treat her with daptomycin which she was treated with before along with ceftriaxone.  If this is going to be cost prohibitive will change to vancomycin and ceftriaxone.  We will plan on treating her for SIX weeks with IV  antibiotics  We have placed an O Pat consult as well as case management consult.           Acey Lav 06/21/2017, 4:19 PM       Date of Admission:  06/20/2017          Reason for Consult: septic arthritis    Referring Provider: Dr. Sherlean Foot   Assessment: Ms. Bowdish is a 52 yo female with PMH of DJD, T2DM, MDD, presenting with recurrent septic joint in R knee. Plan is for 2 step arthroplasty and she is s/p excision of prosthetic right knee joint 2/11. Joint fluid analysis is consistent with septic joint though no growth has been observed (1st aspiration was prior to antibiotic administration).   Plan: 1. Start ceftriaxone 2g daily and daptomycin per pharmacy x 6 wks 2. Will f/u with RICD 3. Consult placed to CM to assess whether daptomycin will be covered by patient's insurance; if not can use vanc 4. Consult placed for OPAT; will place note with orders once we know status of abx coverage by insurance 5. Recommend at least 1 month post excision prior to hardware placement 6. F/u wound culture 2/11  Active Problems:   S/P revision of total knee  Scheduled Meds: . acetaminophen  1,000 mg Oral Q6H  . aspirin EC  325 mg Oral BID  . docusate sodium  100 mg Oral BID  . DULoxetine  60 mg Oral Daily  . gabapentin  800 mg Oral QID  . hydrochlorothiazide  25 mg Oral Daily  . HYDROcodone-acetaminophen  1 tablet Oral Q6H  . lisinopril  20 mg Oral Daily  .  metFORMIN  500 mg Oral Q supper  . traZODone  300 mg Oral QHS   Continuous Infusions: . cefTRIAXone (ROCEPHIN)  IV    . DAPTOmycin (CUBICIN)  IV 500 mg (06/21/17 1159)  . methocarbamol (ROBAXIN)  IV     PRN Meds:.acetaminophen **OR** acetaminophen, alum & mag hydroxide-simeth, bisacodyl, diphenhydrAMINE, HYDROcodone-acetaminophen, HYDROmorphone (DILAUDID) injection, menthol-cetylpyridinium **OR** phenol, methocarbamol **OR** methocarbamol (ROBAXIN)  IV, metoCLOPramide **OR** metoCLOPramide (REGLAN) injection,  ondansetron **OR** ondansetron (ZOFRAN) IV, oxyCODONE, senna-docusate, sodium phosphate, zolpidem  HPI: Brittney Sawyer is a 52 y.o. female w/ PMH of obesity, HTN, DJD, MDD, T2DM presenting to [facility] with worsening right knee pain, swelling and subjective fevers. Patient has h/o DJD with total knee replacement in 2014 with episode of septic joint - first in 2015 treated initially in [place] with polyswap and Dapto/ceftriaxone x6wks then bactrim/cipro for remainder of course for 31mos total abx duration - cultures were not positive at the time. Patient had been doing well until about 2 weeks ago when she noticed increased joint pain and swelling; she was seen in ED and had joint aspiration of cloudy fluid - analysis showed 115K WBC, neutrophil predominant; no growth. At the ED visit, she received Vanc and Zosyn (after aspiration) and was sent home with augmentin and doxycycline. At f/u with ortho, she had worsening pain and swelling so she was admitted. She is s/p excision of total right knee with placement of antibiotic spacer. Plan is to perform 2 step arthoplasty. Cultures were again obtained intraoperatively.   Review of Systems: Review of Systems  Constitutional: Negative for chills and fever.  Respiratory: Negative for shortness of breath.   Cardiovascular: Positive for leg swelling. Negative for chest pain.  Gastrointestinal: Negative for abdominal pain, nausea and vomiting.  Musculoskeletal: Positive for joint pain. Negative for falls.  Skin: Negative for rash.  Neurological: Negative for headaches.    Past Medical History:  Diagnosis Date  . Anxiety   . Arthritis    "all my joints" (06/20/2017)  . Depression   . Diabetes mellitus type 2, controlled (HCC) 05/20/2014  . GERD (gastroesophageal reflux disease)    "gone w/diet revision" (06/20/2017)  . Hyperlipidemia   . Hypertension   . Neuropathy    post back surgery  . Pneumonia X 1  . Right knee DJD 01/27/2013  . Substance abuse (HCC)     Alcohol - sober since 1996    Social History   Tobacco Use  . Smoking status: Current Every Day Smoker    Packs/day: 0.50    Years: 15.00    Pack years: 7.50    Types: Cigarettes  . Smokeless tobacco: Never Used  Substance Use Topics  . 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  . Drug use: Yes    Types: Marijuana, Oxycodone, Hydrocodone    Comment: 2/11//2019 "nothing in the last year"    Family History  Problem Relation Age of Onset  . Thyroid disease Mother   . Hypertension Father   . Anxiety disorder Sister   . Cancer Paternal Grandmother        prostate   Allergies  Allergen Reactions  . Celebrex [Celecoxib] Swelling    UNSPECIFIED AREA  . Wellbutrin [Bupropion] Rash    OBJECTIVE: Blood pressure 95/60, pulse 82, temperature 99 F (37.2 C), temperature source Oral, resp. rate 16, height 5' 0.25" (1.53 m), weight 189 lb (85.7 kg), SpO2  95 %.  Physical Exam  Constitutional: NAD HEENT: anicteric CV: RRR, no M/R/G Resp: No increased work of breathing, CTAB Ext: Right LE in bandage, LLE and UE move freely; DP pulses intact bil Skin: no rashes Psych: not depressed or anxious appearing Neuro: CN 2-12 grossly intact  Lab Results Lab Results  Component Value Date   WBC 10.5 06/17/2017   HGB 11.9 (L) 06/17/2017   HCT 36.4 06/17/2017   MCV 100.0 06/17/2017   PLT 446 (H) 06/17/2017    Lab Results  Component Value Date   CREATININE 0.88 06/17/2017   BUN 19 06/17/2017   NA 139 06/17/2017   K 4.5 06/17/2017   CL 100 (L) 06/17/2017   CO2 26 06/17/2017    Lab Results  Component Value Date   ALT 17 06/17/2017   AST 21 06/17/2017   ALKPHOS 70 06/17/2017   BILITOT 0.5 06/17/2017     Microbiology: Recent Results (from the past 240 hour(s))  Surgical pcr screen     Status: None   Collection Time: 06/17/17 11:26 AM  Result Value Ref Range Status   MRSA, PCR NEGATIVE NEGATIVE Final    Staphylococcus aureus NEGATIVE NEGATIVE Final    Comment: (NOTE) The Xpert SA Assay (FDA approved for NASAL specimens in patients 50 years of age and older), is one component of a comprehensive surveillance program. It is not intended to diagnose infection nor to guide or monitor treatment. Performed at [facility], [address] [place], [place] [address]   Aerobic/Anaerobic Culture (surgical/deep wound)     Status: None (Preliminary result)   Collection Time: 06/20/17  1:24 PM  Result Value Ref Range Status   Specimen Description WOUND  Final   Special Requests RIGHT KNEE  Final   Gram Stain   Final    NO WBC SEEN NO ORGANISMS SEEN Performed at [facility], [address], [place], [place] [address]    Culture PENDING  Incomplete   Report Status PENDING  Incomplete  Aerobic/Anaerobic Culture (surgical/deep wound)     Status: None (Preliminary result)   Collection Time: 06/20/17  1:26 PM  Result Value Ref Range Status   Specimen Description TISSUE  Final   Special Requests RIGHT KNEE  Final   Gram Stain   Final    RARE WBC PRESENT,BOTH PMN AND MONONUCLEAR NO ORGANISMS SEEN Performed at [facility], [address] [place], [place] [address]    Culture PENDING  Incomplete   Report Status PENDING  Incomplete    Nyra Market, MD PGY - 2 [facility] [phone] pager  06/21/2017, 1:06 PM

## 2017-06-21 NOTE — Progress Notes (Signed)
Physical Therapy Treatment Patient Details Name: Brittney Sawyer MRN: [id number] DOB: Oct 01, 1965 Today's Date: 06/21/2017    History of Present Illness Pt is a 52 y/o female s/p R exicision of TKA with antibiotic spacer placement. PMH inlcudes HTN, R TKA, R third toe amputation, back surgery, DM, and smoker.    PT Comments    Pt hesitant to ambulate due to increased pain after ambulation this morning, however, agreeable to ambulating to hallway and back and then walking again later when her husband visits. Pt cleared for gentle ROM 0-40, education given on this positioning and reinforced that she still needs KI for ambulation. PT will continue to follow.    Follow Up Recommendations  Follow surgeon's recommendation for DC plan and follow-up therapies     Equipment Recommendations  3in1 (PT)    Recommendations for Other Services       Precautions / Restrictions Precautions Precautions: Knee;Other (comment) Precaution Booklet Issued: No Precaution Comments: per Laurier Nancy, PA, pt can do gentle ROM 0-40. Educated pt on this and performed in sitting without KI. Pt replaced for ambulation Required Braces or Orthoses: Knee Immobilizer - Right Knee Immobilizer - Right: Other (comment)(when weight bearing) Restrictions Weight Bearing Restrictions: Yes RLE Weight Bearing: Weight bearing as tolerated    Mobility  Bed Mobility Overal bed mobility: Needs Assistance Bed Mobility: Supine to Sit;Sit to Supine     Supine to sit: Min assist Sit to supine: Min assist   General bed mobility comments: pt received in chair  Transfers Overall transfer level: Needs assistance Equipment used: Rolling walker (2 wheeled) Transfers: Sit to/from Stand Sit to Stand: Supervision Stand pivot transfers: Min guard       General transfer comment: Pt safe with use of walker  Ambulation/Gait Ambulation/Gait assistance: Supervision Ambulation Distance (Feet): 60 Feet Assistive device: Rolling  walker (2 wheeled) Gait Pattern/deviations: Step-to pattern;Decreased weight shift to right;Antalgic Gait velocity: decreased Gait velocity interpretation: Below normal speed for age/gender General Gait Details: Limited walking distance in afternoon for pain control but instructed pt to ambulate again in evening with family or nursing.    Stairs            Wheelchair Mobility    Modified Rankin (Stroke Patients Only)       Balance Overall balance assessment: Needs assistance Sitting-balance support: No upper extremity supported;Feet supported Sitting balance-Leahy Scale: Good     Standing balance support: Bilateral upper extremity supported;During functional activity Standing balance-Leahy Scale: Poor Standing balance comment: Pt must have walker in standing and relies heavily on sink when grooming in standing.  Pt could not tolerate for long before having to sit.                            Cognition Arousal/Alertness: Awake/alert Behavior During Therapy: WFL for tasks assessed/performed Overall Cognitive Status: Within Functional Limits for tasks assessed                                 General Comments: Pt very upset today and in great amount of pain. Pt in tears throughout session.  Session focused more on education on less on movement due to pts pain.        Exercises Total Joint Exercises Ankle Circles/Pumps: AROM;Both;20 reps Quad Sets: AROM;Both;10 reps;Seated    General Comments General comments (skin integrity, edema, etc.): Pt in tears most of session.  Pertinent Vitals/Pain Pain Assessment: Faces Pain Score: 10-Worst pain ever Pain Location: R knee  Pain Descriptors / Indicators: Aching Pain Intervention(s): Limited activity within patient's tolerance;Monitored during session;Premedicated before session    Home Living Family/patient expects to be discharged to:: Private residence Living Arrangements:  Spouse/significant other Available Help at Discharge: Family;Available 24 hours/day Type of Home: House Home Access: Stairs to enter Entrance Stairs-Rails: Right;Left Home Layout: Two level;Able to live on main level with bedroom/bathroom Home Equipment: Walker - 2 wheels Additional Comments: needs 3:1    Prior Function Level of Independence: Independent          PT Goals (current goals can now be found in the care plan section) Acute Rehab PT Goals Patient Stated Goal: to go home  PT Goal Formulation: With patient Time For Goal Achievement: 06/27/17 Potential to Achieve Goals: Good Progress towards PT goals: Progressing toward goals    Frequency    7X/week      PT Plan Current plan remains appropriate    Co-evaluation              AM-PAC PT "6 Clicks" Daily Activity  Outcome Measure  Difficulty turning over in bed (including adjusting bedclothes, sheets and blankets)?: A Little Difficulty moving from lying on back to sitting on the side of the bed? : A Little Difficulty sitting down on and standing up from a chair with arms (e.g., wheelchair, bedside commode, etc,.)?: A Little Help needed moving to and from a bed to chair (including a wheelchair)?: A Little Help needed walking in hospital room?: A Little Help needed climbing 3-5 steps with a railing? : A Little 6 Click Score: 18    End of Session Equipment Utilized During Treatment: Gait belt;Right knee immobilizer Activity Tolerance: Patient tolerated treatment well Patient left: in chair;with call bell/phone within reach;with family/visitor present Nurse Communication: Mobility status PT Visit Diagnosis: Other abnormalities of gait and mobility (R26.89);Pain Pain - Right/Left: Right Pain - part of body: Knee     Time: 1415-1436 PT Time Calculation (min) (ACUTE ONLY): 21 min  Charges:  $Gait Training: 8-22 mins                    G Codes:       Lyanne Co, PT  Acute Rehab Services   [phone]    Lawana Chambers Rolfe Hartsell 06/21/2017, 2:58 PM

## 2017-06-21 NOTE — Progress Notes (Signed)
Physical Therapy Treatment Patient Details Name: Brittney Sawyer MRN: [id number] DOB: 1965/10/08 Today's Date: 06/21/2017    History of Present Illness Pt is a 52 y/o female s/p R exicision of TKA with antibiotic spacer placement. PMH inlcudes HTN, R TKA, R third toe amputation, back surgery, DM, and smoker.    PT Comments    Pt painful this morning but able to tolerate 150' ambulation with RW and supervision. PT will continue to follow.    Follow Up Recommendations  DC plan and follow up therapy as arranged by surgeon;Supervision for mobility/OOB     Equipment Recommendations  3in1 (PT)    Recommendations for Other Services       Precautions / Restrictions Precautions Precautions: Knee;Other (comment) Precaution Booklet Issued: No Precaution Comments: Reviewed knee precautions. Did not perform knee ROM with pt as not ROM orders.  Required Braces or Orthoses: Knee Immobilizer - Right Knee Immobilizer - Right: Other (comment)(when WB'ing) Restrictions Weight Bearing Restrictions: Yes RLE Weight Bearing: Weight bearing as tolerated    Mobility  Bed Mobility               General bed mobility comments: pt received in chair  Transfers Overall transfer level: Needs assistance Equipment used: Rolling walker (2 wheeled) Transfers: Sit to/from Stand Sit to Stand: Supervision         General transfer comment: pt stood safely to RW. Upon return to chair, vc's for backing all the way to chair before reaching back  Ambulation/Gait Ambulation/Gait assistance: Supervision Ambulation Distance (Feet): 150 Feet Assistive device: Rolling walker (2 wheeled) Gait Pattern/deviations: Step-to pattern;Decreased weight shift to right;Antalgic Gait velocity: decreased Gait velocity interpretation: Below normal speed for age/gender General Gait Details: pain with ambulation but pt tolerated well. vc's for use of triceps with RW and minimizing shoulder shrug.   Stairs             Wheelchair Mobility    Modified Rankin (Stroke Patients Only)       Balance Overall balance assessment: Needs assistance Sitting-balance support: No upper extremity supported;Feet supported Sitting balance-Leahy Scale: Good     Standing balance support: Bilateral upper extremity supported;During functional activity Standing balance-Leahy Scale: Poor Standing balance comment: Reliant on BUE support.                             Cognition Arousal/Alertness: Awake/alert Behavior During Therapy: WFL for tasks assessed/performed Overall Cognitive Status: Within Functional Limits for tasks assessed                                        Exercises Total Joint Exercises Ankle Circles/Pumps: AROM;Both;20 reps Quad Sets: AROM;Both;10 reps;Seated    General Comments        Pertinent Vitals/Pain Pain Assessment: Faces Faces Pain Scale: Hurts even more Pain Location: R knee  Pain Descriptors / Indicators: Aching;Operative site guarding Pain Intervention(s): Limited activity within patient's tolerance;Monitored during session;Premedicated before session    Home Living                      Prior Function            PT Goals (current goals can now be found in the care plan section) Acute Rehab PT Goals Patient Stated Goal: to go home  PT Goal Formulation: With patient Time For Goal Achievement:  06/27/17 Potential to Achieve Goals: Good Progress towards PT goals: Progressing toward goals    Frequency    7X/week      PT Plan Current plan remains appropriate    Co-evaluation              AM-PAC PT "6 Clicks" Daily Activity  Outcome Measure  Difficulty turning over in bed (including adjusting bedclothes, sheets and blankets)?: A Little Difficulty moving from lying on back to sitting on the side of the bed? : A Little Difficulty sitting down on and standing up from a chair with arms (e.g., wheelchair, bedside commode,  etc,.)?: A Little Help needed moving to and from a bed to chair (including a wheelchair)?: A Little Help needed walking in hospital room?: A Little Help needed climbing 3-5 steps with a railing? : A Little 6 Click Score: 18    End of Session Equipment Utilized During Treatment: Gait belt;Right knee immobilizer Activity Tolerance: Patient tolerated treatment well Patient left: in chair;with call bell/phone within reach Nurse Communication: Mobility status PT Visit Diagnosis: Other abnormalities of gait and mobility (R26.89);Pain Pain - Right/Left: Right Pain - part of body: Knee     Time: 6579-0383 PT Time Calculation (min) (ACUTE ONLY): 27 min  Charges:  $Gait Training: 23-37 mins                    G Codes:       Lyanne Co, PT  Acute Rehab Services  [phone]    Lawana Chambers Lus Kriegel 06/21/2017, 10:32 AM

## 2017-06-21 NOTE — Progress Notes (Signed)
Peripherally Inserted Central Catheter/Midline Placement  The IV Nurse has discussed with the patient and/or persons authorized to consent for the patient, the purpose of this procedure and the potential benefits and risks involved with this procedure.  The benefits include less needle sticks, lab draws from the catheter, and the patient may be discharged home with the catheter. Risks include, but not limited to, infection, bleeding, blood clot (thrombus formation), and puncture of an artery; nerve damage and irregular heartbeat and possibility to perform a PICC exchange if needed/ordered by physician.  Alternatives to this procedure were also discussed.  Bard Power PICC patient education guide, fact sheet on infection prevention and patient information card has been provided to patient /or left at bedside.    PICC/Midline Placement Documentation  PICC Single Lumen 06/21/17 PICC Right Brachial 39 cm 0 cm (Active)  Indication for Insertion or Continuance of Line Home intravenous therapies (PICC only) 06/21/2017  9:22 PM  Exposed Catheter (cm) 0 cm 06/21/2017  9:22 PM  Site Assessment Clean;Dry;Intact 06/21/2017  9:22 PM  Line Status Flushed;Saline locked;Blood return noted 06/21/2017  9:22 PM  Dressing Type Transparent 06/21/2017  9:22 PM  Dressing Status Clean;Dry;Intact;Antimicrobial disc in place 06/21/2017  9:22 PM  Dressing Change Due 06/28/17 06/21/2017  9:22 PM       Noreene Boreman, Lajean Manes 06/21/2017, 9:22 PM

## 2017-06-21 NOTE — Plan of Care (Signed)
  Progressing Education: Knowledge of the prescribed therapeutic regimen will improve 06/21/2017 1258 - Progressing by Darreld Mclean, RN Activity: Ability to avoid complications of mobility impairment will improve 06/21/2017 1258 - Progressing by Darreld Mclean, RN Range of joint motion will improve 06/21/2017 1258 - Progressing by Darreld Mclean, RN Clinical Measurements: Postoperative complications will be avoided or minimized 06/21/2017 1258 - Progressing by Darreld Mclean, RN Pain Management: Pain level will decrease with appropriate interventions 06/21/2017 1258 - Progressing by Darreld Mclean, RN Skin Integrity: Signs of wound healing will improve 06/21/2017 1258 - Progressing by Darreld Mclean, RN Health Behavior/Discharge Planning: Ability to manage health-related needs will improve 06/21/2017 1258 - Progressing by Darreld Mclean, RN Clinical Measurements: Ability to maintain clinical measurements within normal limits will improve 06/21/2017 1258 - Progressing by Darreld Mclean, RN Will remain free from infection 06/21/2017 1258 - Progressing by Darreld Mclean, RN Diagnostic test results will improve 06/21/2017 1258 - Progressing by Darreld Mclean, RN Respiratory complications will improve 06/21/2017 1258 - Progressing by Darreld Mclean, RN Cardiovascular complication will be avoided 06/21/2017 1258 - Progressing by Darreld Mclean, RN Coping: Level of anxiety will decrease 06/21/2017 1258 - Progressing by Darreld Mclean, RN Elimination: Will not experience complications related to bowel motility 06/21/2017 1258 - Progressing by Darreld Mclean, RN Will not experience complications related to urinary retention 06/21/2017 1258 - Progressing by Darreld Mclean, RN Safety: Ability to remain free from injury will improve 06/21/2017 1258 - Progressing by Darreld Mclean, RN Education: Knowledge of General Education information will improve 06/21/2017 1258 - Progressing by Darreld Mclean, RN

## 2017-06-21 NOTE — Care Management Note (Signed)
Case Management Note  Patient Details  Name: Brittney Sawyer MRN: [id number] Date of Birth: Mar 21, 1966  Subjective/Objective:   Right TKA                 Action/Plan: Spoke to pt at bedside. Offered choice for [facility]. Pt agreeable to [facility] for [facility]. Contacted [facility] with new referral. Contacted [facility] for 3n1 bedside commode for home. Has RW at home.    Expected Discharge Date:                 Expected Discharge Plan:  Home w Home Health Services  In-House Referral:  NA  Discharge planning Services  CM Consult  Post Acute Care Choice:  Home Health Choice offered to:  Patient  DME Arranged:  3-N-1 DME Agency:  [facility]  HH Arranged:  PT HH Agency:  [facility] (formerly [facility])  Status of Service:  Completed, signed off  If discussed at [employer] of Stay Meetings, dates discussed:    Additional Comments:  Elliot Cousin, RN 06/21/2017, 3:13 PM

## 2017-06-21 NOTE — Progress Notes (Signed)
[facility]  [facility] will provide Home Infusion Pharmacy services for pt in partnership with [facility] who will provide [facility] services as ordered, PT and HHRN.  [facility] Infusion Coordinator will provide in hospital teaching for the IV ABX administration with the pt/family to support independence at home.  If patient discharges after hours, please call [phone].   Sedalia Muta 06/21/2017, 5:30 PM

## 2017-06-21 NOTE — Evaluation (Signed)
Occupational Therapy Evaluation Patient Details Name: Brittney Sawyer MRN: [id number] DOB: 03-29-1966 Today's Date: 06/21/2017    History of Present Illness Pt is a 52 y/o female s/p R exicision of TKA with antibiotic spacer placement. PMH inlcudes HTN, R TKA, R third toe amputation, back surgery, DM, and smoker.   Clinical Impression   Pt admitted with the above diagnosis and has the deficits listed below. Pt would benefit from cont OT to increase safety and independence with basic adls after this repeat knee surgery. Pt very tired and tearful during session because of pain but pt still moving well.  Pt is very familiar with adl routine post knee surgery and do not feel she will need post acute rehab.  Will continue to see acutely.    Follow Up Recommendations  DC plan and follow up therapy as arranged by surgeon    Equipment Recommendations  3 in 1 bedside commode    Recommendations for Other Services       Precautions / Restrictions Precautions Precautions: Knee;Other (comment) Precaution Booklet Issued: No Precaution Comments: Reviewed knee precautions. Did not perform knee ROM with pt as not ROM orders.  Required Braces or Orthoses: Knee Immobilizer - Right Knee Immobilizer - Right: Other (comment)(when weight bearing) Restrictions Weight Bearing Restrictions: Yes RLE Weight Bearing: Weight bearing as tolerated      Mobility Bed Mobility Overal bed mobility: Needs Assistance Bed Mobility: Supine to Sit;Sit to Supine     Supine to sit: Min assist Sit to supine: Min assist   General bed mobility comments: pt received in chair  Transfers Overall transfer level: Needs assistance Equipment used: Rolling walker (2 wheeled) Transfers: Sit to/from Stand Sit to Stand: Supervision Stand pivot transfers: Min guard       General transfer comment: Pt safe with use of walker    Balance Overall balance assessment: Needs assistance Sitting-balance support: No upper  extremity supported;Feet supported Sitting balance-Leahy Scale: Good     Standing balance support: Bilateral upper extremity supported;During functional activity Standing balance-Leahy Scale: Poor Standing balance comment: Pt must have walker in standing and relies heavily on sink when grooming in standing.  Pt could not tolerate for long before having to sit.                           ADL either performed or assessed with clinical judgement   ADL Overall ADL's : Needs assistance/impaired Eating/Feeding: Independent;Sitting   Grooming: Set up;Standing   Upper Body Bathing: Set up;Sitting   Lower Body Bathing: Minimal assistance;Sit to/from stand   Upper Body Dressing : Set up;Sitting   Lower Body Dressing: Minimal assistance;Sit to/from stand   Toilet Transfer: Minimal assistance;Ambulation;Comfort height toilet;Grab bars;RW   Toileting- Clothing Manipulation and Hygiene: Minimal assistance;Sit to/from stand       Functional mobility during ADLs: Min guard General ADL Comments: Pt does well with adls but is just in a lot of pain with any mobility.  Pt requires min assist with LE adls only due to knee immobilizer.     Vision Baseline Vision/History: No visual deficits Patient Visual Report: No change from baseline Vision Assessment?: No apparent visual deficits     Perception     Praxis      Pertinent Vitals/Pain Pain Assessment: 0-10 Pain Score: 10-Worst pain ever Faces Pain Scale: Hurts even more Pain Location: R knee  Pain Descriptors / Indicators: Aching;Operative site guarding Pain Intervention(s): Monitored during session;Limited activity within patient's  tolerance;Premedicated before session;RN gave pain meds during session;Repositioned;Ice applied;Relaxation;Utilized relaxation techniques     Hand Dominance Right   Extremity/Trunk Assessment Upper Extremity Assessment Upper Extremity Assessment: Overall WFL for tasks assessed   Lower  Extremity Assessment Lower Extremity Assessment: Defer to PT evaluation   Cervical / Trunk Assessment Cervical / Trunk Assessment: Normal   Communication Communication Communication: No difficulties   Cognition Arousal/Alertness: Awake/alert Behavior During Therapy: WFL for tasks assessed/performed Overall Cognitive Status: Within Functional Limits for tasks assessed                                 General Comments: Pt very upset today and in great amount of pain. Pt in tears throughout session.  Session focused more on education on less on movement due to pts pain.     General Comments  Pt in tears most of session.     Exercises Exercises: Total Joint Total Joint Exercises Ankle Circles/Pumps: AROM;Both;20 reps Quad Sets: AROM;Both;10 reps;Seated   Shoulder Instructions      Home Living Family/patient expects to be discharged to:: Private residence Living Arrangements: Spouse/significant other Available Help at Discharge: Family;Available 24 hours/day Type of Home: House Home Access: Stairs to enter [employer] of Steps: 5 Entrance Stairs-Rails: Right;Left Home Layout: Two level;Able to live on main level with bedroom/bathroom     Bathroom Shower/Tub: Tub/shower unit;Curtain;Other (comment)(shower is upstairs)   Firefighter: Standard     Home Equipment: Walker - 2 wheels   Additional Comments: needs 3:1      Prior Functioning/Environment Level of Independence: Independent                 OT Problem List: Decreased activity tolerance;Impaired balance (sitting and/or standing);Pain      OT Treatment/Interventions: Self-care/ADL training;Therapeutic activities    OT Goals(Current goals can be found in the care plan section) Acute Rehab OT Goals Patient Stated Goal: to go home  OT Goal Formulation: With patient Time For Goal Achievement: 07/05/17 Potential to Achieve Goals: Good ADL Goals Pt Will Perform Lower Body Bathing:  with set-up;sit to/from stand Pt Will Perform Lower Body Dressing: with set-up;sit to/from stand Pt Will Perform Tub/Shower Transfer: Tub transfer;with min assist;ambulating;rolling walker Additional ADL Goal #1: Pt will walk to bathroom with a walker and complete all toileting on 3:1 commode over toilet with S  OT Frequency: Min 2X/week   Barriers to D/C:            Co-evaluation              AM-PAC PT "6 Clicks" Daily Activity     Outcome Measure Help from another person eating meals?: None Help from another person taking care of personal grooming?: None Help from another person toileting, which includes using toliet, bedpan, or urinal?: A Little Help from another person bathing (including washing, rinsing, drying)?: A Little Help from another person to put on and taking off regular upper body clothing?: None Help from another person to put on and taking off regular lower body clothing?: A Little 6 Click Score: 21   End of Session Equipment Utilized During Treatment: Rolling walker;Right knee immobilizer Nurse Communication: Mobility status;Other (comment)(pain level)  Activity Tolerance: Patient limited by pain Patient left: in chair;with call bell/phone within reach  OT Visit Diagnosis: Pain;Unsteadiness on feet (R26.81) Pain - Right/Left: Right Pain - part of body: Knee  Time: 5366-4403 OT Time Calculation (min): 14 min Charges:  OT General Charges $OT Visit: 1 Visit OT Evaluation $OT Eval Moderate Complexity: 1 Mod G-Codes:     Tory Emerald, OTR/L [phone]  Hope Budds 06/21/2017, 11:47 AM

## 2017-06-21 NOTE — Progress Notes (Signed)
SPORTS MEDICINE AND JOINT REPLACEMENT  Georgena Spurling, MD    Laurier Nancy, PA-C [address] [place], [place], [place]  [address]                             [phone]   PROGRESS NOTE  Subjective:  negative for Chest Pain  negative for Shortness of Breath  negative for Nausea/Vomiting   negative for Calf Pain  negative for Bowel Movement   Tolerating Diet: yes         Patient reports pain as 5 on 0-10 scale.    Objective: Vital signs in last 24 hours:    Patient Vitals for the past 24 hrs:  BP Temp Temp src Pulse Resp SpO2 Height Weight  06/21/17 0432 95/60 99 F (37.2 C) Oral 82 16 95 % - -  06/21/17 0003 107/60 99.3 F (37.4 C) Oral 77 16 98 % - -  06/20/17 2037 90/60 97.9 F (36.6 C) Oral 87 16 100 % - -  06/20/17 1620 - 98 F (36.7 C) - - - - - -  06/20/17 1616 (!) 80/51 - - (!) 57 19 93 % - -  06/20/17 1611 (!) 80/48 - - (!) 59 14 96 % - -  06/20/17 1601 (!) 78/45 - - (!) 58 16 94 % - -  06/20/17 1548 (!) 77/49 - - 62 13 91 % - -  06/20/17 1531 (!) 104/55 - - 73 20 97 % - -  06/20/17 1522 117/73 - - 75 11 96 % - -  06/20/17 1502 (!) 142/83 - - 79 20 93 % - -  06/20/17 1501 - 97.7 F (36.5 C) - - - - - -  06/20/17 1134 (!) 113/58 - - 72 14 97 % - -  06/20/17 1124 (!) 165/117 - - 77 13 99 % - -  06/20/17 1036 - - - - - - 5' 0.25" (1.53 m) 85.7 kg (189 lb)  06/20/17 1026 (!) 137/47 97.8 F (36.6 C) Oral - (!) 80 97 % - -    @flow {1959:LAST@   Intake/Output from previous day:   02/11 0701 - 02/12 0700 In: 1700 [P.O.:600; I.V.:1100] Out: 30    Intake/Output this shift:   02/11 1901 - 02/12 0700 In: 480 [P.O.:480] Out: -    Intake/Output      02/11 0701 - 02/12 0700   P.O. 600   I.V. (mL/kg) 1100 (12.8)   Total Intake(mL/kg) 1700 (19.8)   Urine (mL/kg/hr) 0   Blood 30   Total Output 30   Net +1670       Urine Occurrence 3 x      LABORATORY DATA: Recent Labs    06/17/17 1157  WBC 10.5  HGB 11.9*  HCT 36.4  PLT 446*   Recent Labs   06/17/17 1157  NA 139  K 4.5  CL 100*  CO2 26  BUN 19  CREATININE 0.88  GLUCOSE 92  CALCIUM 9.6   Lab Results  Component Value Date   INR 0.98 01/26/2013   INR 0.93 01/04/2013   INR 1.0 12/09/2007    Examination:  General appearance: alert, cooperative and no distress Extremities: extremities normal, atraumatic, no cyanosis or edema  Wound Exam: clean, dry, intact   Drainage:  None: wound tissue dry  Motor Exam: Quadriceps and Hamstrings Intact  Sensory Exam: Superficial Peroneal, Deep Peroneal and Tibial normal   Assessment:    1  Day Post-Op  Procedure(s) (LRB): EXCISION RIGHT TOTAL KNEE ARTHROPLASTY WITH ANTIBIOTIC SPACERS (Right)  ADDITIONAL DIAGNOSIS:  Active Problems:   S/P revision of total knee     Plan: Physical Therapy as ordered Weight Bearing as Tolerated (WBAT) in knee immobilizer  DVT Prophylaxis:  Aspirin  DISCHARGE PLAN: Home  Patient progressing well from an orthopedic standpoint. Will consult infectious disease today and once cleared may D/C home.          Guy Sandifer 06/21/2017, 6:44 AM

## 2017-06-21 NOTE — Progress Notes (Signed)
Pharmacy Antibiotic Note  REVE WAREING is a 52 y.o. female admitted on 06/20/2017 with infected right TKA s/p excision with antibiotic spacers on 2/11.  Pharmacy has been consulted for daptomycin dosing for prosthetic joint infection; also on ceftriaxone. Plan is for 2 weeks of IV antibiotics. Renal function is normal.  PHARMACY CONSULT NOTE FOR:  OUTPATIENT  PARENTERAL ANTIBIOTIC THERAPY (OPAT)  Indication: prosthetic joint infection Regimen: ceftriaxone 2 g IV q24h + daptomycin 500 mg IV q24h End date: 07/04/17  IV antibiotic discharge orders are pended. To discharging provider:  please sign these orders via discharge navigator,  Select New Orders & click on the button choice - Manage This Unsigned Work.    Plan: Daptomycin 500 mg IV q24h (~6 mg/kg/day) CK now and weekly Monitor renal function   Height: 5' 0.25" (153 cm) Weight: 189 lb (85.7 kg) IBW/kg (Calculated) : 46.08  Temp (24hrs), Avg:98.4 F (36.9 C), Min:97.7 F (36.5 C), Max:99.3 F (37.4 C)  Recent Labs  Lab 06/17/17 1157  WBC 10.5  CREATININE 0.88    Estimated Creatinine Clearance: 73.1 mL/min (by C-G formula based on SCr of 0.88 mg/dL).    Allergies  Allergen Reactions  . Celebrex [Celecoxib] Swelling    UNSPECIFIED AREA  . Wellbutrin [Bupropion] Rash     Thank you for allowing pharmacy to be a part of this patient's care.  Loura Back, PharmD, BCPS Clinical Pharmacist Phone for today [phone] Main pharmacy - [phone] 06/21/2017 11:08 AM

## 2017-06-22 DIAGNOSIS — T8453XD Infection and inflammatory reaction due to internal right knee prosthesis, subsequent encounter: Secondary | ICD-10-CM

## 2017-06-22 DIAGNOSIS — F329 Major depressive disorder, single episode, unspecified: Secondary | ICD-10-CM

## 2017-06-22 DIAGNOSIS — Z888 Allergy status to other drugs, medicaments and biological substances status: Secondary | ICD-10-CM

## 2017-06-22 DIAGNOSIS — E119 Type 2 diabetes mellitus without complications: Secondary | ICD-10-CM

## 2017-06-22 DIAGNOSIS — Z95828 Presence of other vascular implants and grafts: Secondary | ICD-10-CM

## 2017-06-22 DIAGNOSIS — M1990 Unspecified osteoarthritis, unspecified site: Secondary | ICD-10-CM

## 2017-06-22 LAB — HIV ANTIBODY (ROUTINE TESTING W REFLEX): HIV SCREEN 4TH GENERATION: NONREACTIVE

## 2017-06-22 LAB — HCV AB W REFLEX TO QUANT PCR

## 2017-06-22 LAB — GLUCOSE, CAPILLARY
GLUCOSE-CAPILLARY: 160 mg/dL — AB (ref 65–99)
GLUCOSE-CAPILLARY: 169 mg/dL — AB (ref 65–99)

## 2017-06-22 LAB — HCV INTERPRETATION

## 2017-06-22 MED ORDER — CEFTRIAXONE IV (FOR PTA / DISCHARGE USE ONLY): 2.0000 g | INTRAVENOUS | 0 refills | Status: DC

## 2017-06-22 MED ORDER — VANCOMYCIN IV (FOR PTA / DISCHARGE USE ONLY): 1000.0000 mg | Freq: Two times a day (BID) | INTRAVENOUS | 0 refills | Status: DC

## 2017-06-22 MED ORDER — DAPTOMYCIN IV (FOR PTA / DISCHARGE USE ONLY): 500.0000 mg | INTRAVENOUS | 0 refills | Status: DC

## 2017-06-22 MED ORDER — VANCOMYCIN HCL IN DEXTROSE 1-5 GM/200ML-% IV SOLN
1000.0000 mg | Freq: Two times a day (BID) | INTRAVENOUS | Status: DC
  Administered 2017-06-22: 1000 mg via INTRAVENOUS
  Filled 2017-06-22: qty 200

## 2017-06-22 NOTE — Progress Notes (Signed)
Physical Therapy Treatment Patient Details Name: Brittney Sawyer MRN: [id number] DOB: 01-Oct-1965 Today's Date: 06/22/2017    History of Present Illness Pt is a 52 y/o female s/p R exicision of TKA with antibiotic spacer placement. PMH inlcudes HTN, R TKA, R third toe amputation, back surgery, DM, and smoker.    PT Comments    Patient in less pain this session and able to participate more in therapy. Session focused on stair training, pt supervision level for stairs and reports her husband will be helping her today. Pt educated on safety considerations for home and has no further questions or concerns at this time. Pt has met all functional goals and will benefit from skilled home health PT when medically cleared for d/c.     Follow Up Recommendations  Home health PT;Supervision for mobility/OOB;Follow surgeon's recommendation for DC plan and follow-up therapies     Equipment Recommendations  3in1 (PT)    Recommendations for Other Services       Precautions / Restrictions Precautions Precautions: Knee;Other (comment) Precaution Booklet Issued: No Precaution Comments: per Carlyon Shadow, PA, pt can do gentle ROM 0-40. Educated pt on this and performed in sitting without KI. Pt replaced for ambulation Required Braces or Orthoses: Knee Immobilizer - Right Knee Immobilizer - Right: Other (comment)(when weight bearing) Restrictions Weight Bearing Restrictions: Yes RLE Weight Bearing: Weight bearing as tolerated(with knee immobilzer )    Mobility  Bed Mobility Overal bed mobility: Needs Assistance             General bed mobility comments: pt received in chair  Transfers Overall transfer level: Needs assistance Equipment used: Rolling walker (2 wheeled) Transfers: Sit to/from Stand Sit to Stand: Supervision Stand pivot transfers: Supervision       General transfer comment: Pt safe with use of walker  Ambulation/Gait Ambulation/Gait assistance: Supervision Ambulation  Distance (Feet): 150 Feet Assistive device: Rolling walker (2 wheeled) Gait Pattern/deviations: Step-to pattern;Decreased weight shift to right;Antalgic Gait velocity: decreased   General Gait Details: pt ambulating with KI with slow antalgic gait with short step length. althoug slow, patient is very stable with no buckle or LOB.   Stairs Stairs: Yes   Stair Management: One rail Right;Step to pattern;Sideways Number of Stairs: 16 General stair comments: cues for sequencing and technique, as well as education on husband guarding and positioning. pt able to ambulate stairs with supervision and BUE support on rail sideways. pt reports she feels ready to do at home today.   Wheelchair Mobility    Modified Rankin (Stroke Patients Only)       Balance Overall balance assessment: Needs assistance Sitting-balance support: No upper extremity supported;Feet supported Sitting balance-Leahy Scale: Good     Standing balance support: Bilateral upper extremity supported;During functional activity Standing balance-Leahy Scale: Fair Standing balance comment: pt can stand static without UE support, RW for dynamic mobility.                             Cognition Arousal/Alertness: Awake/alert Behavior During Therapy: WFL for tasks assessed/performed Overall Cognitive Status: Within Functional Limits for tasks assessed                                 General Comments: pt reports she is in less pain today feeling great.       Exercises      General Comments  Pertinent Vitals/Pain Pain Assessment: Faces Faces Pain Scale: Hurts little more Pain Location: R knee  Pain Descriptors / Indicators: Aching Pain Intervention(s): Limited activity within patient's tolerance;Monitored during session;Premedicated before session;Repositioned    Home Living                      Prior Function            PT Goals (current goals can now be found in the  care plan section) Acute Rehab PT Goals Patient Stated Goal: to go home  PT Goal Formulation: With patient Time For Goal Achievement: 06/27/17 Potential to Achieve Goals: Good Progress towards PT goals: Goals met/education completed, patient discharged from PT    Frequency           PT Plan Current plan remains appropriate    Co-evaluation              AM-PAC PT "6 Clicks" Daily Activity  Outcome Measure  Difficulty turning over in bed (including adjusting bedclothes, sheets and blankets)?: A Little Difficulty moving from lying on back to sitting on the side of the bed? : A Little Difficulty sitting down on and standing up from a chair with arms (e.g., wheelchair, bedside commode, etc,.)?: A Little Help needed moving to and from a bed to chair (including a wheelchair)?: A Little Help needed walking in hospital room?: A Little Help needed climbing 3-5 steps with a railing? : A Little 6 Click Score: 18    End of Session Equipment Utilized During Treatment: Gait belt;Right knee immobilizer Activity Tolerance: Patient tolerated treatment well Patient left: in chair;with call bell/phone within reach;with family/visitor present Nurse Communication: Mobility status PT Visit Diagnosis: Other abnormalities of gait and mobility (R26.89);Pain Pain - Right/Left: Right Pain - part of body: Knee     Time: 2671-2458 PT Time Calculation (min) (ACUTE ONLY): 35 min  Charges:  $Gait Training: 23-37 mins                    G Codes:       Reinaldo Berber, PT, DPT Acute Rehab Services Pager: [phone]     Reinaldo Berber 06/22/2017, 2:07 PM

## 2017-06-22 NOTE — Progress Notes (Signed)
PHARMACY CONSULT NOTE FOR:  OUTPATIENT  PARENTERAL ANTIBIOTIC THERAPY (OPAT)  Indication: Septic Joint Regimen: Ceftriaxone 2g IV Q24h and Vancomycin 1g IV Q12h End date: 08/01/17   Orders signed under Dr. Daiva Eves as will use above regimen instead of daptomycin and ceftriaxone due to cost. Would recommend advanced homecare check vanc level soon.  Thank you for allowing pharmacy to be a part of this patient's care.  Armandina Stammer 06/22/2017, 12:57 PM

## 2017-06-22 NOTE — Plan of Care (Signed)
  Progressing Education: Knowledge of the prescribed therapeutic regimen will improve 06/22/2017 1220 - Progressing by Zimal Weisensel, Arline Asp, RN Activity: Ability to avoid complications of mobility impairment will improve 06/22/2017 1220 - Progressing by Weslie Pretlow, Arline Asp, RN Range of joint motion will improve 06/22/2017 1220 - Progressing by Delano Scardino, Arline Asp, RN Clinical Measurements: Postoperative complications will be avoided or minimized 06/22/2017 1220 - Progressing by Andy Gauss, RN Pain Management: Pain level will decrease with appropriate interventions 06/22/2017 1220 - Progressing by Andy Gauss, RN Skin Integrity: Signs of wound healing will improve 06/22/2017 1220 - Progressing by Andy Gauss, RN Health Behavior/Discharge Planning: Ability to manage health-related needs will improve 06/22/2017 1220 - Progressing by Zamire Whitehurst, Arline Asp, RN Clinical Measurements: Ability to maintain clinical measurements within normal limits will improve 06/22/2017 1220 - Progressing by Andy Gauss, RN Will remain free from infection 06/22/2017 1220 - Progressing by Andy Gauss, RN Diagnostic test results will improve 06/22/2017 1220 - Progressing by Andy Gauss, RN Respiratory complications will improve 06/22/2017 1220 - Progressing by Andy Gauss, RN Cardiovascular complication will be avoided 06/22/2017 1220 - Progressing by Andy Gauss, RN Coping: Level of anxiety will decrease 06/22/2017 1220 - Progressing by Andy Gauss, RN Elimination: Will not experience complications related to bowel motility 06/22/2017 1220 - Progressing by Andy Gauss, RN Will not experience complications related to urinary retention 06/22/2017 1220 - Progressing by Andy Gauss, RN Safety: Ability to remain free from injury will improve 06/22/2017 1220 - Progressing by Tamanna Whitson,  Arline Asp, RN Education: Knowledge of General Education information will improve 06/22/2017 1220 - Progressing by Kristian Mogg, Arline Asp, RN

## 2017-06-22 NOTE — Progress Notes (Signed)
Occupational Therapy Treatment Patient Details Name: Brittney Sawyer MRN: [id number] DOB: [birth year] Today's Date: 06/22/2017    History of present illness Pt is a 52 y/o female s/p R exicision of TKA with antibiotic spacer placement. PMH inlcudes HTN, R TKA, R third toe amputation, back surgery, DM, and smoker.   OT comments  Pt making steady progress towards goals; completing room level functional mobility and tub transfer at RW level, UB/LB bathing and dressing with overall MinGuard assist throughout, light MinA during tub transfer. Pt reports will be returning home with spouse assist who is able to assist with ADLs PRN. Pt eager to d/c home later today. Feel Pt is safe to return home with spouse assist once medically ready; will continue to follow acutely to progress Pt towards established OT goals prior to discharge home.    Follow Up Recommendations  Follow surgeon's recommendation for DC plan and follow-up therapies;Supervision - Intermittent    Equipment Recommendations  3 in 1 bedside commode          Precautions / Restrictions Precautions Precautions: Knee;Other (comment) Precaution Booklet Issued: No Precaution Comments: per Laurier Nancy, PA, pt can do gentle ROM 0-40. Educated pt on this and performed in sitting without KI. Pt replaced for ambulation Required Braces or Orthoses: Knee Immobilizer - Right Knee Immobilizer - Right: Other (comment)(when wt bearing ) Restrictions Weight Bearing Restrictions: Yes RLE Weight Bearing: Weight bearing as tolerated(with knee immobilizer )       Mobility Bed Mobility Overal bed mobility: Needs Assistance             General bed mobility comments: pt received in chair  Transfers Overall transfer level: Needs assistance Equipment used: Rolling walker (2 wheeled) Transfers: Sit to/from Stand Sit to Stand: Supervision Stand pivot transfers: Supervision       General transfer comment: Pt safe with use of walker     Balance Overall balance assessment: Needs assistance Sitting-balance support: No upper extremity supported;Feet supported Sitting balance-Leahy Scale: Good     Standing balance support: Bilateral upper extremity supported;During functional activity Standing balance-Leahy Scale: Fair Standing balance comment: pt can stand static without UE support, RW for dynamic mobility.                            ADL either performed or assessed with clinical judgement   ADL Overall ADL's : Needs assistance/impaired     Grooming: Set up;Sitting;Wash/dry face;Oral care;Brushing hair   Upper Body Bathing: Set up;Sitting   Lower Body Bathing: Min guard;Sit to/from stand Lower Body Bathing Details (indicate cue type and reason): MinGuard for safety during standing  Upper Body Dressing : Sitting;Minimal assistance Upper Body Dressing Details (indicate cue type and reason): assist to pull sports bra over back  Lower Body Dressing: Min guard;Sit to/from stand Lower Body Dressing Details (indicate cue type and reason): Pt donning underwear/pants with MinGuard for safety          Tub/ Shower Transfer: Tub transfer;Minimal assistance;Min guard;Ambulation;3 in 1;Rolling walker Tub/Shower Transfer Details (indicate cue type and reason): pt with good carryover of technique  Functional mobility during ADLs: Min guard;Rolling walker General ADL Comments: Pt completing room level functional mobility; UB/LB bathing and dressing, seated grooming ADLs and tub transfer to 3:1                        Cognition Arousal/Alertness: Awake/alert Behavior During Therapy: WFL for tasks assessed/performed Overall  Cognitive Status: Within Functional Limits for tasks assessed                                 General Comments: pt reports she is in less pain today feeling great.                       Pertinent Vitals/ Pain       Pain Assessment: Faces Faces Pain Scale: Hurts  even more Pain Location: R knee  Pain Descriptors / Indicators: Aching Pain Intervention(s): Limited activity within patient's tolerance;Monitored during session;Premedicated before session;Ice applied                                                          Frequency  Min 2X/week        Progress Toward Goals  OT Goals(current goals can now be found in the care plan section)  Progress towards OT goals: Progressing toward goals  Acute Rehab OT Goals Patient Stated Goal: to go home  OT Goal Formulation: With patient Time For Goal Achievement: 07/05/17 Potential to Achieve Goals: Good  Plan Discharge plan remains appropriate                    AM-PAC PT "6 Clicks" Daily Activity     Outcome Measure   Help from another person eating meals?: None Help from another person taking care of personal grooming?: None Help from another person toileting, which includes using toliet, bedpan, or urinal?: A Little Help from another person bathing (including washing, rinsing, drying)?: A Little Help from another person to put on and taking off regular upper body clothing?: None Help from another person to put on and taking off regular lower body clothing?: A Little 6 Click Score: 21    End of Session Equipment Utilized During Treatment: Rolling walker;Right knee immobilizer;Gait belt  OT Visit Diagnosis: Pain;Unsteadiness on feet (R26.81) Pain - Right/Left: Right Pain - part of body: Knee   Activity Tolerance Patient tolerated treatment well   Patient Left in chair;with call bell/phone within reach   Nurse Communication Mobility status        Time: 4037-5436 OT Time Calculation (min): 44 min  Charges: OT General Charges $OT Visit: 1 Visit OT Treatments $Self Care/Home Management : 38-52 mins  Marcy Siren, [place] Pager [phone] 06/22/2017   [name] 06/22/2017, 4:19 PM

## 2017-06-22 NOTE — Care Management (Signed)
Case manager spoke with patient concerning discharge plan. She will have [facility] for physical therapy and RN for PICC line Care. . Patient will need IV antibiotics and they will be provided through [facility]. She will have family support at discharge.

## 2017-06-22 NOTE — Progress Notes (Signed)
Subjective: Pain better controlled today. She denies fevers, chills, nausea, vomiting.  Antibiotics:  Anti-infectives (From admission, onward)   Start     Dose/Rate Route Frequency Ordered Stop   06/21/17 1200  DAPTOmycin (CUBICIN) 500 mg in sodium chloride 0.9 % IVPB     500 mg 220 mL/hr over 30 Minutes Intravenous Every 24 hours 06/21/17 1117     06/21/17 1145  cefTRIAXone (ROCEPHIN) 2 g in sodium chloride 0.9 % 100 mL IVPB     2 g 200 mL/hr over 30 Minutes Intravenous Every 24 hours 06/21/17 1104     06/20/17 1745  ceFAZolin (ANCEF) IVPB 2g/100 mL premix     2 g 200 mL/hr over 30 Minutes Intravenous Every 6 hours 06/20/17 1642 06/21/17 0040   06/20/17 1045  ceFAZolin (ANCEF) IVPB 2g/100 mL premix  Status:  Discontinued     2 g 200 mL/hr over 30 Minutes Intravenous To [facility] 06/17/17 1026 06/20/17 1633      Medications: Scheduled Meds: . aspirin EC  325 mg Oral BID  . docusate sodium  100 mg Oral BID  . DULoxetine  60 mg Oral Daily  . gabapentin  800 mg Oral QID  . hydrochlorothiazide  25 mg Oral Daily  . HYDROcodone-acetaminophen  1 tablet Oral Q6H  . lisinopril  20 mg Oral Daily  . metFORMIN  500 mg Oral Q supper  . traZODone  300 mg Oral QHS   Continuous Infusions: . cefTRIAXone (ROCEPHIN)  IV Stopped (06/21/17 1433)  . DAPTOmycin (CUBICIN)  IV Stopped (06/21/17 1245)  . methocarbamol (ROBAXIN)  IV     PRN Meds:.acetaminophen **OR** acetaminophen, alum & mag hydroxide-simeth, bisacodyl, diphenhydrAMINE, HYDROmorphone (DILAUDID) injection, menthol-cetylpyridinium **OR** phenol, methocarbamol **OR** methocarbamol (ROBAXIN)  IV, metoCLOPramide **OR** metoCLOPramide (REGLAN) injection, ondansetron **OR** ondansetron (ZOFRAN) IV, oxyCODONE, senna-docusate, sodium chloride flush, sodium phosphate, zolpidem  Objective: Weight change:   Intake/Output Summary (Last 24 hours) at 06/22/2017 1136 Last data filed at 06/22/2017 0400 Gross per 24 hour    Intake 1650 ml  Output -  Net 1650 ml   Blood pressure 90/60, pulse 75, temperature 98.3 F (36.8 C), temperature source Oral, resp. rate 16, height 5' 0.25" (1.53 m), weight 189 lb (85.7 kg), SpO2 95 %. Temp:  [98.3 F (36.8 C)-99.5 F (37.5 C)] 98.3 F (36.8 C) (02/13 0403) Pulse Rate:  [75-92] 75 (02/13 0403) Resp:  [16] 16 (02/13 0403) BP: (90-120)/(60-72) 90/60 (02/13 0403) SpO2:  [95 %-98 %] 95 % (02/13 0403)  Physical Exam: General: Alert and awake, oriented x3, not in any acute distress. CV: RRR, no M/R/G Resp: No increased work of breathing, CTAB Ext: Right LE in bandage, LLE and UE move freely; DP pulses intact bil Skin: no rashes  CBC: CBC Latest Ref Rng & Units 06/17/2017 06/09/2017 05/22/2016  WBC 4.0 - 10.5 K/uL 10.5 17.5(H) 14.7(A)  Hemoglobin 12.0 - 15.0 g/dL 11.9(L) 11.0(L) 13.0  Hematocrit 36.0 - 46.0 % 36.4 33.8(L) 37.9  Platelets 150 - 400 K/uL 446(H) 265 -   BMET No results for input(s): NA, K, CL, CO2, GLUCOSE, BUN, CREATININE, CALCIUM in the last 72 hours.   Liver Panel  No results for input(s): PROT, ALBUMIN, AST, ALT, ALKPHOS, BILITOT, BILIDIR, IBILI in the last 72 hours.   Sedimentation Rate No results for input(s): ESRSEDRATE in the last 72 hours. C-Reactive Protein No results for input(s): CRP in the last 72 hours.  Micro Results: Recent Results (from the past 720 hour(s))  Body fluid culture     Status: None   Collection Time: 06/09/17  6:07 PM  Result Value Ref Range Status   Specimen Description   Final    KNEE RIGHT Performed at [place] [address] [place], [place] [address]    Special Requests   Final    Immunocompromised Performed at [facility], [place] [address] [place], [place] [address]    Gram Stain   Final    WBC PRESENT, PREDOMINANTLY PMN NO ORGANISMS SEEN CYTOSPIN Gram Stain Report Called to,Read Back By and Verified With: Annabell Howells [id number] @ 2008 BY J  SCOTTON Performed at [facility], [place] [address], [place], [place] [address]    Culture   Final    NO GROWTH 3 DAYS Performed at [facility], [place] [address] [place], [place] [address]    Report Status 06/13/2017 FINAL  Final  Surgical pcr screen     Status: None   Collection Time: 06/17/17 11:26 AM  Result Value Ref Range Status   MRSA, PCR NEGATIVE NEGATIVE Final   Staphylococcus aureus NEGATIVE NEGATIVE Final    Comment: (NOTE) The Xpert SA Assay (FDA approved for NASAL specimens in patients 33 years of age and older), is one component of a comprehensive surveillance program. It is not intended to diagnose infection nor to guide or monitor treatment. Performed at [facility], [place] [address] [place], [place] [address]   Aerobic/Anaerobic Culture (surgical/deep wound)     Status: None (Preliminary result)   Collection Time: 06/20/17  1:24 PM  Result Value Ref Range Status   Specimen Description WOUND  Final   Special Requests RIGHT KNEE  Final   Gram Stain NO WBC SEEN NO ORGANISMS SEEN   Final   Culture   Final    NO GROWTH 1 DAY Performed at [facility], [place] [address], [place], [place] [address]    Report Status PENDING  Incomplete  Aerobic/Anaerobic Culture (surgical/deep wound)     Status: None (Preliminary result)   Collection Time: 06/20/17  1:26 PM  Result Value Ref Range Status   Specimen Description TISSUE  Final   Special Requests RIGHT KNEE  Final   Gram Stain   Final    RARE WBC PRESENT,BOTH PMN AND MONONUCLEAR NO ORGANISMS SEEN    Culture   Final    NO GROWTH 1 DAY Performed at [facility], [place] [address] [place], [place] [address]    Report Status PENDING  Incomplete    Studies/Results: No results found.    Assessment/Plan:  INTERVAL HISTORY:  Wound culture with no growth  Active Problems:   S/P revision of total knee   Prosthetic joint infection (HCC)   Septic infrapatellar bursitis of  right knee   Brittney Sawyer is a 52 y.o. female with PMH of DJD, T2DM, MDD, presenting with recurrent septic joint in R knee. Plan is for 2 step arthroplasty and she is s/p excision of prosthetic right knee joint 2/11. Joint fluid analysis is consistent with septic joint though no growth has been observed (1st aspiration was prior to antibiotic administration).   Plan: 1. Ceftriaxone 2g daily and daptomycin per pharmacy x 6 wks 2. Will f/u with [facility] 3. Consult placed to CM to assess whether daptomycin will be covered by patient's insurance; if not can use vanc 4. Consult placed for OPAT 5. Recommend 4wks post excision prior to hardware placement 6.  F/u wound culture 2/11   Diagnosis: Right knee septic arthritis  Culture Result: pending  Allergies  Allergen Reactions  . Celebrex [Celecoxib] Swelling    UNSPECIFIED AREA  . Wellbutrin [Bupropion] Rash    OPAT Orders Discharge antibiotics: Daptomycin and ceftriaxone Per pharmacy protocol  Duration: 6wks End Date: August 01, 2017  [facility] Care Per Protocol:  Labs weekly while on IV antibiotics: _x_ CBC with differential _x_ BMP __ CMP __ CRP __ ESR _x_ CK  _x_ Please pull PIC at completion of IV antibiotics __ Please leave PIC in place until doctor has seen patient or been notified  Fax weekly labs to [phone]  Clinic Follow Up Appt: 4wks   LOS: 2 days   Alphonzo Grieve  PGY - 2 Infectious Disease [phone] 06/22/2017, 11:36 AM

## 2017-06-22 NOTE — Plan of Care (Signed)
  Adequate for Discharge Education: Knowledge of the prescribed therapeutic regimen will improve 06/22/2017 1606 - Adequate for Discharge by Andy Gauss, RN 06/22/2017 1220 - Progressing by Andy Gauss, RN Activity: Ability to avoid complications of mobility impairment will improve 06/22/2017 1606 - Adequate for Discharge by Andy Gauss, RN 06/22/2017 1220 - Progressing by Andy Gauss, RN Range of joint motion will improve 06/22/2017 1606 - Adequate for Discharge by Andy Gauss, RN 06/22/2017 1220 - Progressing by Andy Gauss, RN Clinical Measurements: Postoperative complications will be avoided or minimized 06/22/2017 1606 - Adequate for Discharge by Andy Gauss, RN 06/22/2017 1220 - Progressing by Andy Gauss, RN Pain Management: Pain level will decrease with appropriate interventions 06/22/2017 1606 - Adequate for Discharge by Andy Gauss, RN 06/22/2017 1220 - Progressing by Andy Gauss, RN Skin Integrity: Signs of wound healing will improve 06/22/2017 1606 - Adequate for Discharge by Andy Gauss, RN 06/22/2017 1220 - Progressing by Andy Gauss, RN Health Behavior/Discharge Planning: Ability to manage health-related needs will improve 06/22/2017 1606 - Adequate for Discharge by Andy Gauss, RN 06/22/2017 1220 - Progressing by Andy Gauss, RN Clinical Measurements: Ability to maintain clinical measurements within normal limits will improve 06/22/2017 1606 - Adequate for Discharge by Andy Gauss, RN 06/22/2017 1220 - Progressing by Andy Gauss, RN Will remain free from infection 06/22/2017 1606 - Adequate for Discharge by Andy Gauss, RN 06/22/2017 1220 - Progressing by Andy Gauss, RN Diagnostic test results will improve 06/22/2017 1606 - Adequate for Discharge by Andy Gauss, RN 06/22/2017  1220 - Progressing by Andy Gauss, RN Respiratory complications will improve 06/22/2017 1606 - Adequate for Discharge by Andy Gauss, RN 06/22/2017 1220 - Progressing by Andy Gauss, RN Cardiovascular complication will be avoided 06/22/2017 1606 - Adequate for Discharge by Andy Gauss, RN 06/22/2017 1220 - Progressing by Andy Gauss, RN Coping: Level of anxiety will decrease 06/22/2017 1606 - Adequate for Discharge by Andy Gauss, RN 06/22/2017 1220 - Progressing by Andy Gauss, RN Elimination: Will not experience complications related to bowel motility 06/22/2017 1606 - Adequate for Discharge by Andy Gauss, RN 06/22/2017 1220 - Progressing by Andy Gauss, RN Will not experience complications related to urinary retention 06/22/2017 1606 - Adequate for Discharge by Andy Gauss, RN 06/22/2017 1220 - Progressing by Andy Gauss, RN Safety: Ability to remain free from injury will improve 06/22/2017 1606 - Adequate for Discharge by Andy Gauss, RN 06/22/2017 1220 - Progressing by Andy Gauss, RN Education: Knowledge of General Education information will improve 06/22/2017 1606 - Adequate for Discharge by Andy Gauss, RN 06/22/2017 1220 - Progressing by Andy Gauss, RN

## 2017-06-22 NOTE — Discharge Summary (Signed)
[place]   Lara Mulch, MD   Carlyon Shadow, PA-C [address], [place], [place]  [id number]                             [phone]  PATIENT ID: Brittney Sawyer        MRN:  [id number]          DOB/AGE: [birth year] / 52 y.o.    DISCHARGE SUMMARY  ADMISSION DATE:    06/20/2017 DISCHARGE DATE:   06/22/2017   ADMISSION DIAGNOSIS: Infected right total knee arthroplasty    DISCHARGE DIAGNOSIS:  Infected right total knee arthroplasty    ADDITIONAL DIAGNOSIS: Active Problems:   S/P revision of total knee   Prosthetic joint infection (HCC)   Septic infrapatellar bursitis of right knee  Past Medical History:  Diagnosis Date  . Anxiety   . Arthritis    "all my joints" (06/20/2017)  . Depression   . Diabetes mellitus type 2, controlled ([place]) 05/20/2014  . GERD (gastroesophageal reflux disease)    "gone w/diet revision" (06/20/2017)  . Hyperlipidemia   . Hypertension   . Neuropathy    post back surgery  . Pneumonia X 1  . Right knee DJD 01/27/2013  . Substance abuse ([place])    Alcohol - sober since 1996    PROCEDURE: Procedure(s): EXCISION RIGHT TOTAL KNEE ARTHROPLASTY WITH ANTIBIOTIC SPACERS on 06/20/2017  CONSULTS:    HISTORY:  See H&P in chart  HOSPITAL COURSE:  Brittney Sawyer is a 52 y.o. admitted on 06/20/2017 and found to have a diagnosis of Infected right total knee arthroplasty.  After appropriate laboratory studies were obtained  they were taken to the operating room on 06/20/2017 and underwent Procedure(s): EXCISION RIGHT TOTAL KNEE ARTHROPLASTY WITH ANTIBIOTIC SPACERS.   They were given perioperative antibiotics:  Anti-infectives (From admission, onward)   Start     Dose/Rate Route Frequency Ordered Stop   06/22/17 1300  vancomycin (VANCOCIN) IVPB 1000 mg/200 mL premix     1,000 mg 200 mL/hr over 60 Minutes Intravenous Every 12 hours 06/22/17 1251     06/22/17 0000  daptomycin (CUBICIN) IVPB  Status:  Discontinued     500 mg Intravenous  Every 24 hours 06/22/17 1202 06/22/17    06/22/17 0000  cefTRIAXone (ROCEPHIN) IVPB  Status:  Discontinued     2 g Intravenous Every 24 hours 06/22/17 1202 06/22/17    06/22/17 0000  cefTRIAXone (ROCEPHIN) IVPB     2 g Intravenous Every 24 hours 06/22/17 1256     06/22/17 0000  vancomycin IVPB     1,000 mg Intravenous Every 12 hours 06/22/17 1256     06/21/17 1200  DAPTOmycin (CUBICIN) 500 mg in sodium chloride 0.9 % IVPB  Status:  Discontinued     500 mg 220 mL/hr over 30 Minutes Intravenous Every 24 hours 06/21/17 1117 06/22/17 1234   06/21/17 1145  cefTRIAXone (ROCEPHIN) 2 g in sodium chloride 0.9 % 100 mL IVPB     2 g 200 mL/hr over 30 Minutes Intravenous Every 24 hours 06/21/17 1104     06/20/17 1745  ceFAZolin (ANCEF) IVPB 2g/100 mL premix     2 g 200 mL/hr over 30 Minutes Intravenous Every 6 hours 06/20/17 1642 06/21/17 0040   06/20/17 1045  ceFAZolin (ANCEF) IVPB 2g/100 mL premix  Status:  Discontinued     2 g 200 mL/hr over 30 Minutes Intravenous To [employer]  Surgical 06/17/17 1026 06/20/17 1633    .  Patient given tranexamic acid IV or topical and exparel intra-operatively.  Tolerated the procedure well.    POD# 1: Vital signs were stable.  Patient denied Chest pain, shortness of breath, or calf pain.  Patient was started on Lovenox 30 mg subcutaneously twice daily at 8am.  Consults to PT, OT, and care management were made.  The patient was weight bearing as tolerated.  CPM was placed on the operative leg 0-90 degrees for 6-8 hours a day. When out of the CPM, patient was placed in the foam block to achieve full extension. Incentive spirometry was taught.  Dressing was changed.       POD #2, Continued  PT for ambulation and exercise program.  IV saline locked.  O2 discontinued.    The remainder of the hospital course was dedicated to ambulation and strengthening.   The patient was discharged on 2 Days Post-Op in  Good condition.  Blood products given:none  DIAGNOSTIC  STUDIES: Recent vital signs:  Patient Vitals for the past 24 hrs:  BP Temp Temp src Pulse Resp SpO2  06/22/17 0403 90/60 98.3 F (36.8 C) Oral 75 16 95 %  06/21/17 1936 120/60 99.5 F (37.5 C) Oral 92 16 98 %       Recent laboratory studies: Recent Labs    06/17/17 1157  WBC 10.5  HGB 11.9*  HCT 36.4  PLT 446*   Recent Labs    06/17/17 1157  NA 139  K 4.5  CL 100*  CO2 26  BUN 19  CREATININE 0.88  GLUCOSE 92  CALCIUM 9.6   Lab Results  Component Value Date   INR 0.98 01/26/2013   INR 0.93 01/04/2013   INR 1.0 12/09/2007     Recent Radiographic Studies :  Dg Knee Complete 4 Views Right  Result Date: 06/09/2017 CLINICAL DATA:  Swelling and pain of the knee EXAM: RIGHT KNEE - COMPLETE 4+ VIEW COMPARISON:  None. FINDINGS: The right knee demonstrates a total knee arthroplasty without evidence of hardware failure complication. There is a large joint effusion. There is no fracture or dislocation. The alignment is anatomic. IMPRESSION: 1.  No acute osseous injury of the right knee. 2. Large right knee joint effusion. Electronically Signed   By: Kathreen Devoid   On: 06/09/2017 12:56    DISCHARGE INSTRUCTIONS: Discharge Instructions    Call MD / Call 911   Complete by:  As directed    If you experience chest pain or shortness of breath, CALL 911 and be transported to the hospital emergency room.  If you develope a fever above 101 F, pus (white drainage) or increased drainage or redness at the wound, or calf pain, call your surgeon's office.   Constipation Prevention   Complete by:  As directed    Drink plenty of fluids.  Prune juice may be helpful.  You may use a stool softener, such as Colace (over the counter) 100 mg twice a day.  Use MiraLax (over the counter) for constipation as needed.   Diet - low sodium heart healthy   Complete by:  As directed    Discharge instructions   Complete by:  As directed    INSTRUCTIONS AFTER JOINT REPLACEMENT   Remove items at home which  could result in a fall. This includes throw rugs or furniture in walking pathways ICE to the affected joint every three hours while awake for 30 minutes at a time, for at  least the first 3-5 days, and then as needed for pain and swelling.  Continue to use ice for pain and swelling. You may notice swelling that will progress down to the foot and ankle.  This is normal after surgery.  Elevate your leg when you are not up walking on it.   Continue to use the breathing machine you got in the hospital (incentive spirometer) which will help keep your temperature down.  It is common for your temperature to cycle up and down following surgery, especially at night when you are not up moving around and exerting yourself.  The breathing machine keeps your lungs expanded and your temperature down.   DIET:  As you were doing prior to hospitalization, we recommend a well-balanced diet.  DRESSING / WOUND CARE / SHOWERING  Keep the surgical dressing until follow up.  The dressing is water proof, so you can shower without any extra covering.  IF THE DRESSING FALLS OFF or the wound gets wet inside, change the dressing with sterile gauze.  Please use good hand washing techniques before changing the dressing.  Do not use any lotions or creams on the incision until instructed by your surgeon.    ACTIVITY  Increase activity slowly as tolerated, but follow the weight bearing instructions below.   No driving for 6 weeks or until further direction given by your physician.  You cannot drive while taking narcotics.  No lifting or carrying greater than 10 lbs. until further directed by your surgeon. Avoid periods of inactivity such as sitting longer than an hour when not asleep. This helps prevent blood clots.  You may return to work once you are authorized by your doctor.     WEIGHT BEARING   Weight bearing as tolerated with assist device (walker, cane, etc) as directed, use it as long as suggested by your surgeon or  therapist, typically at least 4-6 weeks.   EXERCISES  Results after joint replacement surgery are often greatly improved when you follow the exercise, range of motion and muscle strengthening exercises prescribed by your doctor. Safety measures are also important to protect the joint from further injury. Any time any of these exercises cause you to have increased pain or swelling, decrease what you are doing until you are comfortable again and then slowly increase them. If you have problems or questions, call your caregiver or physical therapist for advice.   Rehabilitation is important following a joint replacement. After just a few days of immobilization, the muscles of the leg can become weakened and shrink (atrophy).  These exercises are designed to build up the tone and strength of the thigh and leg muscles and to improve motion. Often times heat used for twenty to thirty minutes before working out will loosen up your tissues and help with improving the range of motion but do not use heat for the first two weeks following surgery (sometimes heat can increase post-operative swelling).   These exercises can be done on a training (exercise) mat, on the floor, on a table or on a bed. Use whatever works the best and is most comfortable for you.    Use music or television while you are exercising so that the exercises are a pleasant break in your day. This will make your life better with the exercises acting as a break in your routine that you can look forward to.   Perform all exercises about fifteen times, three times per day or as directed.  You should exercise both  the operative leg and the other leg as well.   Exercises include:   Quad Sets - Tighten up the muscle on the front of the thigh (Quad) and hold for 5-10 seconds.   Straight Leg Raises - With your knee straight (if you were given a brace, keep it on), lift the leg to 60 degrees, hold for 3 seconds, and slowly lower the leg.  Perform this  exercise against resistance later as your leg gets stronger.  Leg Slides: Lying on your back, slowly slide your foot toward your buttocks, bending your knee up off the floor (only go as far as is comfortable). Then slowly slide your foot back down until your leg is flat on the floor again.  Angel Wings: Lying on your back spread your legs to the side as far apart as you can without causing discomfort.  Hamstring Strength:  Lying on your back, push your heel against the floor with your leg straight by tightening up the muscles of your buttocks.  Repeat, but this time bend your knee to a comfortable angle, and push your heel against the floor.  You may put a pillow under the heel to make it more comfortable if necessary.   A rehabilitation program following joint replacement surgery can speed recovery and prevent re-injury in the future due to weakened muscles. Contact your doctor or a physical therapist for more information on knee rehabilitation.    CONSTIPATION  Constipation is defined medically as fewer than three stools per week and severe constipation as less than one stool per week.  Even if you have a regular bowel pattern at home, your normal regimen is likely to be disrupted due to multiple reasons following surgery.  Combination of anesthesia, postoperative narcotics, change in appetite and fluid intake all can affect your bowels.   YOU MUST use at least one of the following options; they are listed in order of increasing strength to get the job done.  They are all available over the counter, and you may need to use some, POSSIBLY even all of these options:    Drink plenty of fluids (prune juice may be helpful) and high fiber foods Colace 100 mg by mouth twice a day  Senokot for constipation as directed and as needed Dulcolax (bisacodyl), take with full glass of water  Miralax (polyethylene glycol) once or twice a day as needed.  If you have tried all these things and are unable to have a  bowel movement in the first 3-4 days after surgery call either your surgeon or your primary doctor.    If you experience loose stools or diarrhea, hold the medications until you stool forms back up.  If your symptoms do not get better within 1 week or if they get worse, check with your doctor.  If you experience "the worst abdominal pain ever" or develop nausea or vomiting, please contact the office immediately for further recommendations for treatment.   ITCHING:  If you experience itching with your medications, try taking only a single pain pill, or even half a pain pill at a time.  You can also use Benadryl over the counter for itching or also to help with sleep.   TED HOSE STOCKINGS:  Use stockings on both legs until for at least 2 weeks or as directed by physician office. They may be removed at night for sleeping.  MEDICATIONS:  See your medication summary on the "After Visit Summary" that nursing will review with you.  You may  have some home medications which will be placed on hold until you complete the course of blood thinner medication.  It is important for you to complete the blood thinner medication as prescribed.  PRECAUTIONS:  If you experience chest pain or shortness of breath - call 911 immediately for transfer to the hospital emergency department.   If you develop a fever greater that 101 F, purulent drainage from wound, increased redness or drainage from wound, foul odor from the wound/dressing, or calf pain - CONTACT YOUR SURGEON.                                                   FOLLOW-UP APPOINTMENTS:  If you do not already have a post-op appointment, please call the office for an appointment to be seen by your surgeon.  Guidelines for how soon to be seen are listed in your "After Visit Summary", but are typically between 1-4 weeks after surgery.  OTHER INSTRUCTIONS:   Knee Replacement:  Do not place pillow under knee, focus on keeping the knee straight while resting. CPM  instructions: 0-90 degrees, 2 hours in the morning, 2 hours in the afternoon, and 2 hours in the evening. Place foam block, curve side up under heel at all times except when in CPM or when walking.  DO NOT modify, tear, cut, or change the foam block in any way.  MAKE SURE YOU:  Understand these instructions.  Get help right away if you are not doing well or get worse.    Thank you for letting us be a part of your medical care team.  It is a privilege we respect greatly.  We hope these instructions will help you stay on track for a fast and full recovery!   Home infusion instructions [facility] May follow [place] Dosing Protocol; May administer Cathflo as needed to maintain patency of vascular access device.; Flushing of vascular access device: per [facility] Protocol: 0.9% NaCl pre/post medica...   Complete by:  As directed    Instructions:  May follow [place] Dosing Protocol   Instructions:  May administer Cathflo as needed to maintain patency of vascular access device.   Instructions:  Flushing of vascular access device: per [facility] Protocol: 0.9% NaCl pre/post medication administration and prn patency; Heparin 100 u/ml, 52m for implanted ports and Heparin 10u/ml, 53mfor all other central venous catheters.   Instructions:  May follow [facility] Anaphylaxis Protocol for First Dose Administration in the home: 0.9% NaCl at 25-50 ml/hr to maintain IV access for protocol meds. Epinephrine 0.3 ml IV/IM PRN and Benadryl 25-50 IV/IM PRN s/s of anaphylaxis.   Instructions:  [facility] Coordinator (RN) to assist per patient IV care needs in the home PRN.   Home infusion instructions [facility] May follow [place] Protocol; May administer Cathflo as needed to maintain patency of vascular access device.; Flushing of vascular access device: per [facility]: 0.9% NaCl pre/post medica...   Complete by:  As directed    Instructions:  May follow [place] Protocol    Instructions:  May administer Cathflo as needed to maintain patency of vascular access device.   Instructions:  Flushing of vascular access device: per [facility]: 0.9% NaCl pre/post medication administration and prn patency; Heparin 100 u/ml, 55m26mor implanted ports and Heparin 10u/ml, 55ml11m  for all other central venous catheters.   Instructions:  May follow [facility] Anaphylaxis Protocol for First Dose Administration in the home: 0.9% NaCl at 25-50 ml/hr to maintain IV access for protocol meds. Epinephrine 0.3 ml IV/IM PRN and Benadryl 25-50 IV/IM PRN s/s of anaphylaxis.   Instructions:  [place] Infusion Coordinator (RN) to assist per patient IV care needs in the home PRN.   Increase activity slowly as tolerated   Complete by:  As directed       DISCHARGE MEDICATIONS:   Allergies as of 06/22/2017      Reactions   Celebrex [celecoxib] Swelling   UNSPECIFIED AREA   Wellbutrin [bupropion] Rash      Medication List    STOP taking these medications   amoxicillin-clavulanate 875-125 MG tablet Commonly known as:  AUGMENTIN   ibuprofen 200 MG tablet Commonly known as:  ADVIL,MOTRIN     TAKE these medications   acetaminophen 500 MG tablet Commonly known as:  TYLENOL Take 2 tablets (1,000 mg total) by mouth every 8 (eight) hours as needed.   acyclovir 200 MG capsule Commonly known as:  ZOVIRAX TAKE TWO CAPSULES BY MOUTH THREE TIMES A DAY FOR 5 DAYS AS NEEDED FOR OUTBREAK   aspirin 325 MG EC tablet Take 1 tablet (325 mg total) by mouth 2 (two) times daily. What changed:    medication strength  how much to take  when to take this   B-complex with vitamin C tablet Take 1 tablet by mouth daily.   cefTRIAXone IVPB Commonly known as:  ROCEPHIN Inject 2 g into the vein daily. Indication: Septic Joint Last Day of Therapy:  3/25 Labs - Once weekly:  CBC/D and BMP, Labs - Every other week:  ESR and CRP   DULoxetine 60 MG capsule Commonly known as:  CYMBALTA TAKE ONE  CAPSULE BY MOUTH TWICE A DAY **MUST CALL MD FOR APPOINTMENT   gabapentin 800 MG tablet Commonly known as:  NEURONTIN Take 1 tablet (800 mg total) by mouth 4 (four) times daily. Office visit needed   lisinopril-hydrochlorothiazide 20-25 MG tablet Commonly known as:  PRINZIDE,ZESTORETIC TAKE ONE TABLET BY MOUTH DAILY   metFORMIN 500 MG tablet Commonly known as:  GLUCOPHAGE TAKE ONE TABLET BY MOUTH EVERY NIGHT AT BEDTIME   methocarbamol 500 MG tablet Commonly known as:  ROBAXIN Take 1-2 tablets (500-1,000 mg total) by mouth every 6 (six) hours as needed for muscle spasms.   multivitamin tablet Take 1 tablet by mouth daily.   Oxycodone HCl 10 MG Tabs Take 1 tablet (10 mg total) by mouth every 4 (four) hours as needed for moderate pain ((score 4 to 6)).   traZODone 100 MG tablet Commonly known as:  DESYREL TAKE ONE TABLET BY MOUTH THREE TIMES A DAY -- NEED OFFICE VISIT What changed:  See the new instructions.   vancomycin IVPB Inject 1,000 mg into the vein every 12 (twelve) hours. Indication: Septic Joint Last Day of Therapy: 08/01/17 Labs - Sunday/Monday:  CBC/D, BMP, and vancomycin trough. Labs - Thursday:  BMP and vancomycin trough Labs - Every other week:  ESR and CRP            Home Infusion Instuctions  (From admission, onward)        Start     Ordered   06/22/17 0000  Home infusion instructions [facility] May follow [place] Dosing Protocol; May administer Cathflo as needed to maintain patency of vascular access device.; Flushing of vascular access device:  per [facility] Protocol: 0.9% NaCl pre/post medica...    Question Answer Comment  Instructions May follow [place] Dosing Protocol   Instructions May administer Cathflo as needed to maintain patency of vascular access device.   Instructions Flushing of vascular access device: per [facility] Protocol: 0.9% NaCl pre/post medication administration and prn patency; Heparin 100 u/ml, 60m for implanted ports and  Heparin 10u/ml, 542mfor all other central venous catheters.   Instructions May follow [facility] Anaphylaxis Protocol for First Dose Administration in the home: 0.9% NaCl at 25-50 ml/hr to maintain IV access for protocol meds. Epinephrine 0.3 ml IV/IM PRN and Benadryl 25-50 IV/IM PRN s/s of anaphylaxis.   Instructions [facility] Infusion Coordinator (RN) to assist per patient IV care needs in the home PRN.      06/22/17 1202   06/22/17 0000  Home infusion instructions [facility] May follow [place] [place] Protocol; May administer Cathflo as needed to maintain patency of vascular access device.; Flushing of vascular access device: per [facility]: 0.9% NaCl pre/post medica...    Question Answer Comment  Instructions May follow [place] Protocol   Instructions May administer Cathflo as needed to maintain patency of vascular access device.   Instructions Flushing of vascular access device: per [facility]: 0.9% NaCl pre/post medication administration and prn patency; Heparin 100 u/ml, 54m8mor implanted ports and Heparin 10u/ml, 54ml554mr all other central venous catheters.   Instructions May follow [facility] Anaphylaxis Protocol for First Dose Administration in the home: 0.9% NaCl at 25-50 ml/hr to maintain IV access for protocol meds. Epinephrine 0.3 ml IV/IM PRN and Benadryl 25-50 IV/IM PRN s/s of anaphylaxis.   Instructions [facility] Infusion Coordinator (RN) to assist per patient IV care needs in the home PRN.      06/22/17 1256       Durable Medical Equipment  (From admission, onward)        Start     Ordered   06/20/17 1643  DME Walker rolling  Once    Question:  Patient needs a walker to treat with the following condition  Answer:  S/P revision of total knee   06/20/17 1642   06/20/17 1643  DME 3 n 1  Once     06/20/17 1642   06/20/17 1643  DME Bedside commode  Once    Question:  Patient needs a bedside commode to treat with the following condition  Answer:   S/P revision of total knee   06/20/17 1642      FOLLOW UP VISIT:   Follow-up Information    [facility] Follow up.   Specialty:  Home[place]:  [place] call to arrange initial visit Contact information: [id number] [phone]       DISPOSITION: HOME VS. SNF  CONDITION:  GoodKeenan Bachelor3/2019, 2:02 PM

## 2017-06-23 NOTE — Anesthesia Postprocedure Evaluation (Signed)
Anesthesia Post Note  Patient: SUNJAI BRUEGGEN  Procedure(s) Performed: EXCISION RIGHT TOTAL KNEE ARTHROPLASTY WITH ANTIBIOTIC SPACERS (Right Knee)     Patient location during evaluation: PACU Anesthesia Type: General Level of consciousness: awake and alert Pain management: pain level controlled Vital Signs Assessment: post-procedure vital signs reviewed and stable Respiratory status: spontaneous breathing, nonlabored ventilation, respiratory function stable and patient connected to nasal cannula oxygen Cardiovascular status: blood pressure returned to baseline and stable Postop Assessment: no apparent nausea or vomiting Anesthetic complications: no    Last Vitals:  Vitals:   06/21/17 1936 06/22/17 0403  BP: 120/60 90/60  Pulse: 92 75  Resp: 16 16  Temp: 37.5 C 36.8 C  SpO2: 98% 95%    Last Pain:  Vitals:   06/22/17 1642  TempSrc:   PainSc: 7                  Britini Garcilazo COKER

## 2017-06-24 ENCOUNTER — Telehealth: Payer: Self-pay | Admitting: *Deleted

## 2017-06-24 NOTE — Telephone Encounter (Signed)
Cordelia Pen, RN at [facility], called for advice. Patient's husband needs to adjust dosing schedule so he can get them completed before he leaves for work in the morning. RN asked Cordelia Pen to contact [facility] for advice in dose rescheduling/nurse visits for accurate vanc trough measurement. Andree Coss, RN

## 2017-06-25 LAB — AEROBIC/ANAEROBIC CULTURE W GRAM STAIN (SURGICAL/DEEP WOUND)
Culture: NO GROWTH
Gram Stain: NONE SEEN

## 2017-06-25 LAB — AEROBIC/ANAEROBIC CULTURE (SURGICAL/DEEP WOUND): CULTURE: NO GROWTH

## 2017-06-27 ENCOUNTER — Encounter: Payer: Self-pay | Admitting: Infectious Disease

## 2017-06-27 NOTE — Telephone Encounter (Signed)
Thanks Michelle

## 2017-06-28 NOTE — Op Note (Signed)
NAMEBRENN, EVANGELISTA NO.:  [id number]  MEDICAL RECORD NO.:  [id number]  LOCATION:  ED                           FACILITY:  [facility]  PHYSICIAN:  Mila Homer. Sherlean Foot, M.D. DATE OF BIRTH:  [birth year]  DATE OF PROCEDURE:  06/20/2017 DATE OF DISCHARGE:                              OPERATIVE REPORT   SURGEON:  Mila Homer. Sherlean Foot, MD.  ASSISTANT:  Laurier Nancy, PA-C.  ANESTHESIA:  General.  PREOPERATIVE DIAGNOSIS:  Right total knee infection.  POSTOPERATIVE DIAGNOSIS:  Right total knee infection.  PROCEDURE:  Right excision arthroplasty of an infected total knee with placement of antibiotic cement spacer, irrigation and debridement.  INDICATION FOR PROCEDURE:  The patient is a 52 year old, who is several years status post a right total knee arthroplasty by Dr. Kristeen Miss. She has had several bouts of potential infection and then was documented with infection recently.  Dr. Madelon Lips asked me to take over her care and I agreed.  DESCRIPTION OF PROCEDURE:  The patient was taken to the operating room and administered general anesthesia.  The right leg was prepped and draped in usual fashion.  The extremity was not exsanguinated, but the tourniquet was inflated.  A midline incision was made with a #10 blade. New blade was used to make a median parapatellar arthrotomy.  I did culture the tissue as well as the fluid.  I then performed an aggressive synovectomy with a series of #10 blade and scalpel and forceps.  I then used a micro saw to remove the femoral component.  I then used curettes and rongeurs to remove all questionable bone and soft tissue.  There was a piece of bone distally that I do not think will need augment at the revision stage.  However, there were several milliliters of deteriorated purulent bone that were debrided.  I then was able to remove the tibia easily with one hit with my mallet and bone tamp.  I then used 0.25-inch osteotome to remove the  cement.  I then used curettes and rongeurs to aggressively debride the bone and soft tissues.  I then irrigated with 6000 mL of LR in the knee.  I then mixed up 3 batches of antibiotic- impregnated cement, fashioned the articulating spacer into the knee and once hard, I let tourniquet down and obtained hemostasis.  The knee was easily able to go through her arc of motion 0-45 degrees that was very stable in extension.  I then closed the arthrotomy with a running V-Loc stitch which was monofilament and then some monofilament deep soft tissue and subcuticular stitches and staples.  TOURNIQUET TIME:  37 minutes.  COMPLICATIONS:  None.  DRAINS:  None.          ______________________________ Mila Homer. Sherlean Foot, M.D.     SDL/MEDQ  D:  06/28/2017  T:  06/28/2017  Job:  [id number]

## 2017-06-28 NOTE — Op Note (Signed)
Dictation Number:  [id number]

## 2017-07-01 ENCOUNTER — Other Ambulatory Visit: Payer: Self-pay | Admitting: Pharmacist

## 2017-07-04 ENCOUNTER — Encounter: Payer: Self-pay | Admitting: Infectious Disease

## 2017-07-09 ENCOUNTER — Other Ambulatory Visit: Payer: Self-pay | Admitting: Physician Assistant

## 2017-07-09 DIAGNOSIS — G894 Chronic pain syndrome: Secondary | ICD-10-CM

## 2017-07-11 ENCOUNTER — Other Ambulatory Visit: Payer: Self-pay | Admitting: Orthopedic Surgery

## 2017-07-11 ENCOUNTER — Encounter: Payer: Self-pay | Admitting: Infectious Disease

## 2017-07-12 NOTE — Telephone Encounter (Signed)
It appears she established care with [facility].  Contact patient and advise she will need to have this refilled with Enrique Sack, PCP.

## 2017-07-15 ENCOUNTER — Other Ambulatory Visit: Payer: Self-pay | Admitting: Pharmacist

## 2017-07-18 ENCOUNTER — Encounter: Payer: Self-pay | Admitting: Infectious Disease

## 2017-07-18 NOTE — Progress Notes (Signed)
I was called re this patient's BG of over 600. Apparently she just take metformin for BG this high which is clearly not going to get job done w re to control of her DM. I recommended she see PCP vs ED for better control. She is due to see me on Wednesday in clinic.

## 2017-07-20 ENCOUNTER — Inpatient Hospital Stay: Payer: Commercial Managed Care - PPO | Admitting: Infectious Disease

## 2017-07-20 ENCOUNTER — Inpatient Hospital Stay (HOSPITAL_COMMUNITY)
Admission: EM | Admit: 2017-07-20 | Discharge: 2017-07-22 | DRG: 638 | Disposition: A | Discharge status: Home Health | Payer: Commercial Managed Care - PPO | Attending: Internal Medicine | Admitting: Internal Medicine

## 2017-07-20 ENCOUNTER — Encounter (HOSPITAL_COMMUNITY): Payer: Self-pay | Admitting: Emergency Medicine

## 2017-07-20 ENCOUNTER — Other Ambulatory Visit: Payer: Self-pay

## 2017-07-20 DIAGNOSIS — F1021 Alcohol dependence, in remission: Secondary | ICD-10-CM | POA: Diagnosis present

## 2017-07-20 DIAGNOSIS — Z888 Allergy status to other drugs, medicaments and biological substances status: Secondary | ICD-10-CM

## 2017-07-20 DIAGNOSIS — F17210 Nicotine dependence, cigarettes, uncomplicated: Secondary | ICD-10-CM | POA: Diagnosis present

## 2017-07-20 DIAGNOSIS — Z96659 Presence of unspecified artificial knee joint: Secondary | ICD-10-CM

## 2017-07-20 DIAGNOSIS — Z90710 Acquired absence of both cervix and uterus: Secondary | ICD-10-CM

## 2017-07-20 DIAGNOSIS — Z79899 Other long term (current) drug therapy: Secondary | ICD-10-CM

## 2017-07-20 DIAGNOSIS — M1991 Primary osteoarthritis, unspecified site: Secondary | ICD-10-CM | POA: Diagnosis present

## 2017-07-20 DIAGNOSIS — E860 Dehydration: Secondary | ICD-10-CM | POA: Diagnosis present

## 2017-07-20 DIAGNOSIS — I10 Essential (primary) hypertension: Secondary | ICD-10-CM | POA: Diagnosis present

## 2017-07-20 DIAGNOSIS — E1110 Type 2 diabetes mellitus with ketoacidosis without coma: Principal | ICD-10-CM | POA: Diagnosis present

## 2017-07-20 DIAGNOSIS — Z981 Arthrodesis status: Secondary | ICD-10-CM

## 2017-07-20 DIAGNOSIS — Z8249 Family history of ischemic heart disease and other diseases of the circulatory system: Secondary | ICD-10-CM

## 2017-07-20 DIAGNOSIS — Z7982 Long term (current) use of aspirin: Secondary | ICD-10-CM

## 2017-07-20 DIAGNOSIS — N179 Acute kidney failure, unspecified: Secondary | ICD-10-CM | POA: Diagnosis present

## 2017-07-20 DIAGNOSIS — T8453XA Infection and inflammatory reaction due to internal right knee prosthesis, initial encounter: Secondary | ICD-10-CM | POA: Diagnosis present

## 2017-07-20 DIAGNOSIS — G8929 Other chronic pain: Secondary | ICD-10-CM | POA: Diagnosis present

## 2017-07-20 DIAGNOSIS — E119 Type 2 diabetes mellitus without complications: Secondary | ICD-10-CM

## 2017-07-20 DIAGNOSIS — E871 Hypo-osmolality and hyponatremia: Secondary | ICD-10-CM | POA: Diagnosis present

## 2017-07-20 DIAGNOSIS — Z881 Allergy status to other antibiotic agents status: Secondary | ICD-10-CM

## 2017-07-20 DIAGNOSIS — Z89421 Acquired absence of other right toe(s): Secondary | ICD-10-CM

## 2017-07-20 DIAGNOSIS — M009 Pyogenic arthritis, unspecified: Secondary | ICD-10-CM | POA: Diagnosis present

## 2017-07-20 LAB — URINALYSIS, ROUTINE W REFLEX MICROSCOPIC
BACTERIA UA: NONE SEEN
BILIRUBIN URINE: NEGATIVE
Glucose, UA: >=500 mg/dL — AB
Hgb urine dipstick: NEGATIVE
Ketones, ur: 80 mg/dL — AB
Leukocytes, UA: NEGATIVE
NITRITE: NEGATIVE
Protein, ur: 30 mg/dL — AB
SPECIFIC GRAVITY, URINE: 1.02 (ref 1.005–1.030)
pH: 6 (ref 5.0–8.0)

## 2017-07-20 LAB — HEPATIC FUNCTION PANEL
ALT: 5 U/L — AB (ref 14–54)
AST: 18 U/L (ref 15–41)
Albumin: 4.5 g/dL (ref 3.5–5.0)
Alkaline Phosphatase: 103 U/L (ref 38–126)
Bilirubin, Direct: <0.1 mg/dL — ABNORMAL LOW (ref 0.1–0.5)
TOTAL PROTEIN: 7.8 g/dL (ref 6.5–8.1)
Total Bilirubin: 1.4 mg/dL — ABNORMAL HIGH (ref 0.3–1.2)

## 2017-07-20 LAB — BASIC METABOLIC PANEL
ANION GAP: 21 — AB (ref 5–15)
BUN: 17 mg/dL (ref 6–20)
CHLORIDE: 91 mmol/L — AB (ref 101–111)
CO2: 17 mmol/L — ABNORMAL LOW (ref 22–32)
Calcium: 9.8 mg/dL (ref 8.9–10.3)
Creatinine, Ser: 1.17 mg/dL — ABNORMAL HIGH (ref 0.44–1.00)
GFR calc Af Amer: >60 mL/min (ref >60)
GFR calc non Af Amer: 53 mL/min — ABNORMAL LOW (ref >60)
GLUCOSE: 437 mg/dL — AB (ref 65–99)
POTASSIUM: 4.2 mmol/L (ref 3.5–5.1)
Sodium: 129 mmol/L — ABNORMAL LOW (ref 135–145)

## 2017-07-20 LAB — CBG MONITORING, ED
GLUCOSE-CAPILLARY: 171 mg/dL — AB (ref 65–99)
GLUCOSE-CAPILLARY: 437 mg/dL — AB (ref 65–99)
GLUCOSE-CAPILLARY: 490 mg/dL — AB (ref 65–99)
Glucose-Capillary: 306 mg/dL — ABNORMAL HIGH (ref 65–99)

## 2017-07-20 LAB — CBC
HEMATOCRIT: 41.2 % (ref 36.0–46.0)
HEMOGLOBIN: 14.1 g/dL (ref 12.0–15.0)
MCH: 32.5 pg (ref 26.0–34.0)
MCHC: 34.2 g/dL (ref 30.0–36.0)
MCV: 94.9 fL (ref 78.0–100.0)
Platelets: 297 10*3/uL (ref 150–400)
RBC: 4.34 MIL/uL (ref 3.87–5.11)
RDW: 12.3 % (ref 11.5–15.5)
WBC: 12.1 10*3/uL — ABNORMAL HIGH (ref 4.0–10.5)

## 2017-07-20 LAB — LIPASE, BLOOD: Lipase: 87 U/L — ABNORMAL HIGH (ref 11–51)

## 2017-07-20 LAB — VANCOMYCIN, TROUGH: Vancomycin Tr: 22 ug/mL (ref 15–20)

## 2017-07-20 MED ORDER — METHOCARBAMOL 500 MG PO TABS: 500.0000 mg | ORAL_TABLET | Freq: Four times a day (QID) | ORAL | Status: DC | PRN

## 2017-07-20 MED ORDER — ASPIRIN EC 81 MG PO TBEC
81.0000 mg | DELAYED_RELEASE_TABLET | Freq: Every day | ORAL | Status: DC
  Administered 2017-07-21 – 2017-07-22 (×2): 81 mg via ORAL
  Filled 2017-07-20 (×3): qty 1

## 2017-07-20 MED ORDER — DEXTROSE-NACL 5-0.45 % IV SOLN: INTRAVENOUS | Status: DC

## 2017-07-20 MED ORDER — TRAZODONE HCL 50 MG PO TABS
300.0000 mg | ORAL_TABLET | Freq: Every day | ORAL | Status: DC
  Administered 2017-07-21 (×2): 300 mg via ORAL
  Filled 2017-07-20: qty 6
  Filled 2017-07-20 (×2): qty 3

## 2017-07-20 MED ORDER — INSULIN ASPART 100 UNIT/ML ~~LOC~~ SOLN
8.0000 [IU] | Freq: Once | SUBCUTANEOUS | Status: AC
  Administered 2017-07-20: 8 [IU] via SUBCUTANEOUS
  Filled 2017-07-20: qty 1

## 2017-07-20 MED ORDER — GABAPENTIN 400 MG PO CAPS
800.0000 mg | ORAL_CAPSULE | Freq: Four times a day (QID) | ORAL | Status: DC
  Administered 2017-07-21 – 2017-07-22 (×6): 800 mg via ORAL
  Filled 2017-07-20 (×6): qty 2

## 2017-07-20 MED ORDER — VANCOMYCIN IV (FOR PTA / DISCHARGE USE ONLY): 1000.0000 mg | Freq: Two times a day (BID) | INTRAVENOUS | Status: DC

## 2017-07-20 MED ORDER — CEFTRIAXONE IV (FOR PTA / DISCHARGE USE ONLY): 2.0000 g | INTRAVENOUS | Status: DC

## 2017-07-20 MED ORDER — SODIUM CHLORIDE 0.9 % IV SOLN
INTRAVENOUS | Status: DC
  Administered 2017-07-20: 22:00:00 via INTRAVENOUS

## 2017-07-20 MED ORDER — SODIUM CHLORIDE 0.9 % IV SOLN
INTRAVENOUS | Status: DC
  Administered 2017-07-20: 3.4 [IU]/h via INTRAVENOUS
  Filled 2017-07-20: qty 1

## 2017-07-20 MED ORDER — SODIUM CHLORIDE 0.9 % IV SOLN: INTRAVENOUS | Status: DC

## 2017-07-20 MED ORDER — SODIUM CHLORIDE 0.9 % IV SOLN
INTRAVENOUS | Status: AC
  Administered 2017-07-20: 21:00:00 via INTRAVENOUS

## 2017-07-20 MED ORDER — HYDRALAZINE HCL 20 MG/ML IJ SOLN: 10.0000 mg | INTRAMUSCULAR | Status: DC | PRN

## 2017-07-20 MED ORDER — OXYCODONE HCL 5 MG PO TABS
10.0000 mg | ORAL_TABLET | ORAL | Status: DC | PRN
  Administered 2017-07-21 – 2017-07-22 (×6): 10 mg via ORAL
  Filled 2017-07-20 (×6): qty 2

## 2017-07-20 MED ORDER — CEFTRIAXONE SODIUM 2 G IJ SOLR
2.0000 g | INTRAMUSCULAR | Status: DC
  Administered 2017-07-20 – 2017-07-21 (×2): 2 g via INTRAVENOUS
  Filled 2017-07-20 (×2): qty 20
  Filled 2017-07-20: qty 2

## 2017-07-20 MED ORDER — SODIUM CHLORIDE 0.9 % IV BOLUS (SEPSIS)
1000.0000 mL | Freq: Once | INTRAVENOUS | Status: AC
  Administered 2017-07-20: 1000 mL via INTRAVENOUS

## 2017-07-20 MED ORDER — SODIUM CHLORIDE 0.9 % IV SOLN
INTRAVENOUS | Status: DC
  Administered 2017-07-20: 2.5 [IU]/h via INTRAVENOUS
  Filled 2017-07-20: qty 1

## 2017-07-20 MED ORDER — ENOXAPARIN SODIUM 40 MG/0.4ML ~~LOC~~ SOLN
40.0000 mg | SUBCUTANEOUS | Status: DC
  Administered 2017-07-21 – 2017-07-22 (×2): 40 mg via SUBCUTANEOUS
  Filled 2017-07-20 (×3): qty 0.4

## 2017-07-20 MED ORDER — DULOXETINE HCL 60 MG PO CPEP
60.0000 mg | ORAL_CAPSULE | Freq: Two times a day (BID) | ORAL | Status: DC
  Administered 2017-07-21 – 2017-07-22 (×4): 60 mg via ORAL
  Filled 2017-07-20 (×3): qty 1
  Filled 2017-07-20: qty 2

## 2017-07-20 NOTE — ED Notes (Signed)
Called lab and they added on lipase and HFP. Will notify RN

## 2017-07-20 NOTE — H&P (Addendum)
History and Physical    Brittney Sawyer BTD:[id number] DOB: [birth year] DOA: 07/20/2017  PCP: Helane Rima, MD  Patient coming from: Home.  Chief Complaint: Elevated blood sugar.  HPI: Brittney Sawyer is a 52 y.o. female with history of diabetes mellitus type 2, hypertension who has been treated for septic arthritis with ceftriaxone and vancomycin presents to the ER with complaints of persistently elevated blood sugar.  Patient states over the last 3 weeks patient is found to have elevated blood sugar which has been persistently elevated.  Patient over the last 1 week also has benign exam upper abdominal discomfort with nausea vomiting.  Denies any diarrhea.  Denies any chest pain specifically or any shortness of breath.  ED Course: In the ER patient's blood sugar was found to be 437 with anion gap of 21.  Urine shows ketones.  Lipase is mildly elevated.  Patient was started on fluid bolus for DKA and insulin infusion started.  On exam patient has mild epigastric tenderness.  Review of Systems: As per HPI, rest all negative.   Past Medical History:  Diagnosis Date  . Anxiety   . Arthritis    "all my joints" (06/20/2017)  . Depression   . Diabetes mellitus type 2, controlled ([place]) 05/20/2014  . GERD (gastroesophageal reflux disease)    "gone w/diet revision" (06/20/2017)  . Hyperlipidemia   . Hypertension   . Neuropathy    post back surgery  . Pneumonia X 1  . Right knee DJD 01/27/2013  . Substance abuse ([place])    Alcohol - sober since 1996    Past Surgical History:  Procedure Laterality Date  . AMPUTATION Right 10/03/2015   Procedure: Right Foot 3rd Ray Amputation;  Surgeon: Newt Minion, MD;  Location: [place];  Service: Orthopedics;  Laterality: Right;  . BACK SURGERY    . BUNIONECTOMY WITH HAMMERTOE RECONSTRUCTION Bilateral   . EXCISIONAL TOTAL KNEE ARTHROPLASTY WITH ANTIBIOTIC SPACERS Right 06/20/2017  . EXCISIONAL TOTAL KNEE ARTHROPLASTY WITH ANTIBIOTIC SPACERS Right 06/20/2017    Procedure: EXCISION RIGHT TOTAL KNEE ARTHROPLASTY WITH ANTIBIOTIC SPACERS;  Surgeon: Vickey Huger, MD;  Location: [place];  Service: Orthopedics;  Laterality: Right;  . JOINT REPLACEMENT    . POSTERIOR LUMBAR FUSION  11/2007   L4, L5   . POSTERIOR LUMBAR FUSION  01/2008     second surgery L2-L5 and S1  . SHOULDER ARTHROSCOPY W/ ROTATOR CUFF REPAIR Left 01/2017  . TOTAL KNEE ARTHROPLASTY Right 01/26/2013   Procedure: TOTAL KNEE ARTHROPLASTY;  Surgeon: Yvette Rack., MD;  Location: [place];  Service: Orthopedics;  Laterality: Right;  . TUBAL LIGATION    . VAGINAL HYSTERECTOMY       reports that she has been smoking cigarettes.  She has a 7.50 pack-year smoking history. she has never used smokeless tobacco. She reports that she uses drugs. Drugs: Marijuana, Oxycodone, and Hydrocodone. She reports that she does not drink alcohol.  Allergies  Allergen Reactions  . Celebrex [Celecoxib] Swelling    UNSPECIFIED AREA  . Wellbutrin [Bupropion] Rash    Family History  Problem Relation Age of Onset  . Thyroid disease Mother   . Hypertension Father   . Anxiety disorder Sister   . Cancer Paternal Grandmother        prostate    Prior to Admission medications   Medication Sig Start Date End Date Taking? Authorizing Provider  acetaminophen (TYLENOL) 500 MG tablet Take 2 tablets (1,000 mg total) by mouth every 8 (eight)  hours as needed. 08/26/16  Yes Shawnee Knapp, MD  acyclovir (ZOVIRAX) 200 MG capsule TAKE TWO CAPSULES BY MOUTH THREE TIMES A DAY FOR 5 DAYS AS NEEDED FOR OUTBREAK 11/13/16  Yes English, Stephanie D, PA  aspirin EC 325 MG EC tablet Take 1 tablet (325 mg total) by mouth 2 (two) times daily. 06/21/17  Yes [name], [place]  aspirin EC 81 MG tablet Take 81 mg by mouth daily.   Yes [provider]  B Complex-C (B-COMPLEX WITH VITAMIN C) tablet Take 1 tablet by mouth daily.   Yes [provider]  cefTRIAXone (ROCEPHIN) IVPB Inject 2 g into the vein daily. Indication:  Septic Joint Last Day of Therapy:  3/25 Labs - Once weekly:  CBC/D and BMP, Labs - Every other week:  ESR and CRP 06/22/17  Yes Vickey Huger, MD  DULoxetine (CYMBALTA) 60 MG capsule TAKE ONE CAPSULE BY MOUTH TWICE A DAY **MUST CALL MD FOR APPOINTMENT 07/11/17  Yes Ivar Drape D, PA  gabapentin (NEURONTIN) 800 MG tablet Take 1 tablet (800 mg total) by mouth 4 (four) times daily. Office visit needed 05/02/17  Yes English, [place] D, PA  lisinopril-hydrochlorothiazide (PRINZIDE,ZESTORETIC) 20-25 MG tablet TAKE ONE TABLET BY MOUTH DAILY 09/03/16  Yes Shawnee Knapp, MD  metFORMIN (GLUCOPHAGE) 500 MG tablet TAKE ONE TABLET BY MOUTH EVERY NIGHT AT BEDTIME 09/03/16  Yes Shawnee Knapp, MD  methocarbamol (ROBAXIN) 500 MG tablet Take 1-2 tablets (500-1,000 mg total) by mouth every 6 (six) hours as needed for muscle spasms. 06/21/17  Yes [name], PA  Multiple Vitamin (MULTIVITAMIN) tablet Take 1 tablet by mouth daily.   Yes [provider]  nicotine polacrilex (NICORETTE MINI) 2 MG lozenge Take 2 mg by mouth as needed for smoking cessation.   Yes [provider]  oxyCODONE 10 MG TABS Take 1 tablet (10 mg total) by mouth every 4 (four) hours as needed for moderate pain ((score 4 to 6)). 06/21/17  Yes [name], PA  traZODone (DESYREL) 100 MG tablet TAKE ONE TABLET BY MOUTH THREE TIMES A DAY -- NEED OFFICE VISIT Patient taking differently: TAKE 200MG-359m BY MOUTH AT BEDTIME 01/20/17  Yes SWardell Honour MD  vancomycin IVPB Inject 1,000 mg into the vein every 12 (twelve) hours. Indication: Septic Joint Last Day of Therapy: 08/01/17 Labs - Sunday/Monday:  CBC/D, BMP, and vancomycin trough. Labs - Thursday:  BMP and vancomycin trough Labs - Every other week:  ESR and CRP 06/22/17  Yes LVickey Huger MD    Physical Exam: Vitals:   07/20/17 1930 07/20/17 2045 07/20/17 2100 07/20/17 2130  BP: 110/72 104/74 121/71 (!) 99/56  Pulse: 93 89 87 88  Resp:      Temp:        TempSrc:      SpO2: 100% 99% 95% 99%      Constitutional: Moderately built and nourished. Vitals:   07/20/17 1930 07/20/17 2045 07/20/17 2100 07/20/17 2130  BP: 110/72 104/74 121/71 (!) 99/56  Pulse: 93 89 87 88  Resp:      Temp:      TempSrc:      SpO2: 100% 99% 95% 99%   Eyes: Anicteric no pallor. ENMT: No discharge from the ears eyes nose or mouth. Neck: No mass felt.  No neck rigidity. Respiratory: No rhonchi or crepitations. Cardiovascular: S1-S2 heard no murmurs appreciated. Abdomen: Soft mild epigastric tenderness no guarding or rigidity. Musculoskeletal: No edema.  No joint effusion. Skin: No  rash.  Skin appears warm. Neurologic: Alert awake oriented to time place and person.  Moves all extremities. Psychiatric: Appears normal.  Normal affect.   Labs on Admission: I have personally reviewed following labs and imaging studies  CBC: Recent Labs  Lab 07/20/17 1637  WBC 12.1*  HGB 14.1  HCT 41.2  MCV 94.9  PLT 767   Basic Metabolic Panel: Recent Labs  Lab 07/20/17 1637  NA 129*  K 4.2  CL 91*  CO2 17*  GLUCOSE 437*  BUN 17  CREATININE 1.17*  CALCIUM 9.8   GFR: CrCl cannot be calculated (Unknown ideal weight.). Liver Function Tests: Recent Labs  Lab 07/20/17 1632  AST 18  ALT 5*  ALKPHOS 103  BILITOT 1.4*  PROT 7.8  ALBUMIN 4.5   Recent Labs  Lab 07/20/17 1632  LIPASE 87*   No results for input(s): AMMONIA in the last 168 hours. Coagulation Profile: No results for input(s): INR, PROTIME in the last 168 hours. Cardiac Enzymes: No results for input(s): CKTOTAL, CKMB, CKMBINDEX, TROPONINI in the last 168 hours. BNP (last 3 results) No results for input(s): PROBNP in the last 8760 hours. HbA1C: No results for input(s): HGBA1C in the last 72 hours. CBG: Recent Labs  Lab 07/20/17 1608 07/20/17 1840 07/20/17 2112  GLUCAP 437* 490* 306*   Lipid Profile: No results for input(s): CHOL, HDL, LDLCALC, TRIG, CHOLHDL, LDLDIRECT in the  last 72 hours. Thyroid Function Tests: No results for input(s): TSH, T4TOTAL, FREET4, T3FREE, THYROIDAB in the last 72 hours. Anemia Panel: No results for input(s): VITAMINB12, FOLATE, FERRITIN, TIBC, IRON, RETICCTPCT in the last 72 hours. Urine analysis:    Component Value Date/Time   COLORURINE YELLOW 07/20/2017 1903   APPEARANCEUR CLEAR 07/20/2017 1903   LABSPEC 1.020 07/20/2017 1903   PHURINE 6.0 07/20/2017 1903   GLUCOSEU >=500 (A) 07/20/2017 1903   HGBUR NEGATIVE 07/20/2017 1903   BILIRUBINUR NEGATIVE 07/20/2017 1903   BILIRUBINUR neg 11/25/2011 1038   KETONESUR 80 (A) 07/20/2017 1903   PROTEINUR 30 (A) 07/20/2017 1903   UROBILINOGEN 0.2 08/14/2013 0337   NITRITE NEGATIVE 07/20/2017 1903   LEUKOCYTESUR NEGATIVE 07/20/2017 1903   Sepsis Labs: @LABRCNTIP (procalcitonin:4,lacticidven:4) )No results found for this or any previous visit (from the past 240 hour(s)).   Radiological Exams on Admission: No results found.  EKG: Independently reviewed.  Normal sinus rhythm with age indeterminant anteroseptal infarct.  Assessment/Plan Principal Problem:   DKA (diabetic ketoacidoses) (HCC) Active Problems:   HTN (hypertension)   Chronic pain   Septic joint (HCC)   S/P revision of total knee    1. Diabetic ketoacidosis -patient states her blood sugar has been running well usually until last few weeks after patient has been placed on antibiotics for the septic arthritis.  At this time patient is continued on insulin infusion which should be continued until anion gap gets corrected.  Patient may need to be on Lantus insulin or other long-acting insulin at discharge.  Follow metabolic panel closely.  I think patient's hyponatremia will improve with correction of sugar. 2. Nausea vomiting with abdominal discomfort and elevated lipase -CT abdomen and pelvis has been ordered.  Nausea vomiting could be either from diabetes or possible pancreatitis.  Check troponin. 3. Acute renal failure  likely from dehydration.  Follow metabolic panel.  Patient is receiving fluids. 4. Septic joint on ceftriaxone and vancomycin would be continued.  Patient follows with Dr. Drucilla Schmidt. 5. Hypertension -on presentation patient had low normal blood pressure.  Will hold antihypertensives and  keep patient on PRN IV hydralazine. 6. Chronic pain we will continue home medications.   DVT prophylaxis: Lovenox. Code Status: Full code. Family Communication: Discussed with patient. Disposition Plan: Home. Consults called: None. Admission status: Observation.   Rise Patience MD [facility] Pager [phone].  If 7PM-7AM, please contact night-coverage [email or web address] Password [facility]  07/20/2017, 11:29 PM

## 2017-07-20 NOTE — ED Notes (Signed)
ED TO INPATIENT HANDOFF REPORT  Name/Age/Gender Brittney Sawyer 52 y.o. female  Code Status Code Status History    Date Active Date Inactive Code Status Order ID Comments User Context   06/20/2017 16:42 06/22/2017 19:51 Full Code [id number]  Brittney Sawyer, [place] Inpatient   06/09/2017 19:05 06/10/2017 01:34 Full Code [id number]  Brittney Sawyer ED   01/26/2013 14:51 01/29/2013 17:23 Full Code [id number]  Brittney Sawyer Inpatient   01/02/2012 16:07 01/03/2012 11:47 Full Code [id number]  Brittney Sawyer ED      Home/SNF/Other Home  Chief Complaint hyperglycemia; Post op issues  Level of Care/Admitting Diagnosis ED Disposition    ED Disposition Condition Comment   Admit  Hospital Area: [place] [[id number]]  Level of Care: Stepdown [14]  Admit to SDU based on following criteria: Severe physiological/psychological symptoms:  Any diagnosis requiring assessment & intervention at least every 4 hours on an ongoing basis to obtain desired patient outcomes including stability and rehabilitation  Diagnosis: DKA (diabetic ketoacidoses) [facility]) [283662]  Admitting Physician: Brittney Sawyer [phone]  Attending Physician: Brittney Sawyer Lei.Right  PT Class (Do Not Modify): Observation [104]  PT Acc Code (Do Not Modify): Observation [10022]       Medical History Past Medical History:  Diagnosis Date  . Anxiety   . Arthritis    "all my joints" (06/20/2017)  . Depression   . Diabetes mellitus type 2, controlled ([place]) 05/20/2014  . GERD (gastroesophageal reflux disease)    "gone w/diet revision" (06/20/2017)  . Hyperlipidemia   . Hypertension   . Neuropathy    post back surgery  . Pneumonia X 1  . Right knee DJD 01/27/2013  . Substance abuse ([place])    Alcohol - sober since 1996    Allergies Allergies  Allergen Reactions  . Celebrex [Celecoxib] Swelling    UNSPECIFIED AREA  . Wellbutrin [Bupropion] Rash    IV Location/Drains/Wounds Patient  Lines/Drains/Airways Status   Active Line/Drains/Airways    Name:   Placement date:   Placement time:   Site:   Days:   PICC Single Lumen 06/21/17 PICC Right Brachial 39 cm 0 cm   06/21/17    2110    Brachial   29   External Urinary Catheter   06/22/17    1142    -   28   Airway   06/20/17    1305     30   Incision 01/26/13 Knee Right   01/26/13    1204     1636   Incision (Closed) 10/03/15 Foot Right   10/03/15    1359     656   Incision (Closed) 06/20/17 Knee Right   06/20/17    1443     30          Labs/Imaging Results for orders placed or performed during the hospital encounter of 07/20/17 (from the past 48 hour(s))  CBG monitoring, ED     Status: Abnormal   Collection Time: 07/20/17  4:08 PM  Result Value Ref Range   Glucose-Capillary 437 (H) 65 - 99 mg/dL  Hepatic function panel     Status: Abnormal   Collection Time: 07/20/17  4:32 PM  Result Value Ref Range   Total Protein 7.8 6.5 - 8.1 g/dL   Albumin 4.5 3.5 - 5.0 g/dL   Sawyer 18 15 - 41 U/L   ALT 5 (L) 14 - 54 U/L   Alkaline Phosphatase 103 38 - 126 U/L  Total Bilirubin 1.4 (H) 0.3 - 1.2 mg/dL   Bilirubin, Direct <0.1 (L) 0.1 - 0.5 mg/dL   Indirect Bilirubin NOT CALCULATED 0.3 - 0.9 mg/dL    Comment: Performed at [facility], [place] [address], [place], [place] [address]  Lipase, blood     Status: Abnormal   Collection Time: 07/20/17  4:32 PM  Result Value Ref Range   Lipase 87 (H) 11 - 51 U/L    Comment: Performed at [facility], [place] [address], [place], [place] [address]  Basic metabolic panel     Status: Abnormal   Collection Time: 07/20/17  4:37 PM  Result Value Ref Range   Sodium 129 (L) 135 - 145 mmol/L   Potassium 4.2 3.5 - 5.1 mmol/L   Chloride 91 (L) 101 - 111 mmol/L   CO2 17 (L) 22 - 32 mmol/L   Glucose, Bld 437 (H) 65 - 99 mg/dL   BUN 17 6 - 20 mg/dL   Creatinine, Ser 1.17 (H) 0.44 - 1.00 mg/dL   Calcium 9.8 8.9 - 10.3 mg/dL   GFR calc non Af Amer 53 (L) >60  mL/min   GFR calc Af Amer >60 >60 mL/min    Comment: (NOTE) The eGFR has been calculated using the CKD EPI equation. This calculation has not been validated in all clinical situations. eGFR's persistently <60 mL/min signify possible Chronic Kidney Disease.    Anion gap 21 (H) 5 - 15    Comment: RESULT CHECKED Performed at [facility], [place] [address], [place], [place] [address]   CBC     Status: Abnormal   Collection Time: 07/20/17  4:37 PM  Result Value Ref Range   WBC 12.1 (H) 4.0 - 10.5 K/uL   RBC 4.34 3.87 - 5.11 MIL/uL   Hemoglobin 14.1 12.0 - 15.0 g/dL   HCT 41.2 36.0 - 46.0 %   MCV 94.9 78.0 - 100.0 fL   MCH 32.5 26.0 - 34.0 pg   MCHC 34.2 30.0 - 36.0 g/dL   RDW 12.3 11.5 - 15.5 %   Platelets 297 150 - 400 K/uL    Comment: Performed at [facility], [place] [address] [place], [place] [address]  CBG monitoring, ED     Status: Abnormal   Collection Time: 07/20/17  6:40 PM  Result Value Ref Range   Glucose-Capillary 490 (H) 65 - 99 mg/dL  Urinalysis, Routine w reflex microscopic     Status: Abnormal   Collection Time: 07/20/17  7:03 PM  Result Value Ref Range   Color, Urine YELLOW YELLOW   APPearance CLEAR CLEAR   Specific Gravity, Urine 1.020 1.005 - 1.030   pH 6.0 5.0 - 8.0   Glucose, UA >=500 (A) NEGATIVE mg/dL   Hgb urine dipstick NEGATIVE NEGATIVE   Bilirubin Urine NEGATIVE NEGATIVE   Ketones, ur 80 (A) NEGATIVE mg/dL   Protein, ur 30 (A) NEGATIVE mg/dL   Nitrite NEGATIVE NEGATIVE   Leukocytes, UA NEGATIVE NEGATIVE   RBC / HPF 0-5 0 - 5 RBC/hpf   WBC, UA 0-5 0 - 5 WBC/hpf   Bacteria, UA NONE SEEN NONE SEEN   Squamous Epithelial / LPF 0-5 (A) NONE SEEN    Comment: Performed at [facility], [place] [address] [place], [place] [address]  CBG monitoring, ED     Status: Abnormal   Collection Time: 07/20/17  9:12 PM  Result Value Ref Range   Glucose-Capillary 306 (H) 65 - 99  mg/dL   No results  found.  Pending Labs Unresulted Labs (From admission, onward)   Start     Ordered   07/20/17 2219  Vancomycin, trough  Once,   TIMED     07/20/17 2218   07/20/17 2019  Blood gas, venous  STAT,   STAT     07/20/17 2019      Vitals/Pain Today's Vitals   07/20/17 1930 07/20/17 2045 07/20/17 2100 07/20/17 2130  BP: 110/72 104/74 121/71 (!) 99/56  Pulse: 93 89 87 88  Resp:      Temp:      TempSrc:      SpO2: 100% 99% 95% 99%  PainSc:        Isolation Precautions No active isolations  Medications Medications  0.9 %  sodium chloride infusion ( Intravenous Stopped 07/20/17 2150)  insulin regular (NOVOLIN R,HUMULIN R) 100 Units in sodium chloride 0.9 % 100 mL (1 Units/mL) infusion (2.5 Units/hr Intravenous New Bag/Given 07/20/17 2221)  0.9 %  sodium chloride infusion ( Intravenous New Bag/Given 07/20/17 2221)  dextrose 5 %-0.45 % sodium chloride infusion (not administered)  cefTRIAXone (ROCEPHIN) 2 g in sodium chloride 0.9 % 100 mL IVPB (not administered)  sodium chloride 0.9 % bolus 1,000 mL (0 mLs Intravenous Stopped 07/20/17 2100)  insulin aspart (novoLOG) injection 8 Units (8 Units Subcutaneous Given 07/20/17 2000)    Mobility walks

## 2017-07-20 NOTE — ED Provider Notes (Signed)
Medical screening examination/treatment/procedure(s) were conducted as a shared visit with non-physician practitioner(s) and myself.  I personally evaluated the patient during the encounter.  Patient presents to the emergency room for evaluation of nausea vomiting and hyperglycemia.  Patient has history of diabetes.  Normally she is very well controlled on oral medications.  Patient has been getting treatment for a knee infection.  Over the last several days patient has had trouble with hypoglycemia nausea and vomiting.  Her laboratory tests are consistent with diabetic ketoacidosis.  Patient fortunately does not appear toxic and is in no distress in the emergency room.  Plan on IV hydration insulin infusion and admission to the hospital for further treatment   Medical screening examination/treatment/procedure(s) were conducted as a shared visit with non-physician practitioner(s) and myself.  I personally evaluated the patient during the encounter.     Linwood Dibbles, MD 07/20/17 2056

## 2017-07-20 NOTE — ED Triage Notes (Signed)
Pt complaint of hyperglycemia and n/v; right knee surgery 5 weeks ago.

## 2017-07-20 NOTE — ED Provider Notes (Signed)
[facility] DEPT Provider Note   CSN: [id number] Arrival date & time: 07/20/17  1543     History   Chief Complaint Chief Complaint  Patient presents with  . Hyperglycemia  . Emesis    HPI DEAISHA Sawyer is a 52 y.o. female with history of diabetes on metformin 500 mg daily and recent right knee surgery complicated by infection, currently receiving antibiotics via a PICC line, is here for evaluation of elevated glucose per glucose meter at home. States the glucometer unable to register the number. Has been feeling exhaustion, nausea, intermittent vomiting, epigastric abdominal pain, increased thirst for the last 2-3 weeks and has noticed her blood glucose slowly increasing daily. Her home health nurse told her to come to the ED today. Has not followed up with primary care provider regarding this.  She denies fevers, chills, chest pain, shortness of breath, cough, dysuria, polyuria, changes in bowel movements. Feels like her right knee infection is responding well to antibiotics.   HPI  Past Medical History:  Diagnosis Date  . Anxiety   . Arthritis    "all my joints" (06/20/2017)  . Depression   . Diabetes mellitus type 2, controlled ([place]) 05/20/2014  . GERD (gastroesophageal reflux disease)    "gone w/diet revision" (06/20/2017)  . Hyperlipidemia   . Hypertension   . Neuropathy    post back surgery  . Pneumonia X 1  . Right knee DJD 01/27/2013  . Substance abuse ([place])    Alcohol - sober since 1996    Patient Active Problem List   Diagnosis Date Noted  . DKA (diabetic ketoacidoses) ([place]) 07/20/2017  . Prosthetic joint infection ([place])   . Septic infrapatellar bursitis of right knee   . S/P revision of total knee 06/20/2017  . Septic joint ([place]) 06/09/2017  . History of tobacco use 08/31/2016  . Diabetes mellitus type 2, controlled ([place]) 05/20/2014  . Right knee DJD 01/27/2013  . Vaginal candidiasis 01/27/2013  . Metabolic syndrome  34/19/3790  . Obesity (BMI 35.0-39.9 without comorbidity) 12/19/2012  . History of substance use disorder 01/07/2012  . Menopause 11/25/2011  . DDD (degenerative disc disease), lumbar 11/25/2011  . HTN (hypertension) 10/21/2011  . Mood disorder ([place]) 10/21/2011  . Chronic pain 10/21/2011    Past Surgical History:  Procedure Laterality Date  . AMPUTATION Right 10/03/2015   Procedure: Right Foot 3rd Ray Amputation;  Surgeon: Newt Minion, MD;  Location: [facility];  Service: Orthopedics;  Laterality: Right;  . BACK SURGERY    . BUNIONECTOMY WITH HAMMERTOE RECONSTRUCTION Bilateral   . EXCISIONAL TOTAL KNEE ARTHROPLASTY WITH ANTIBIOTIC SPACERS Right 06/20/2017  . EXCISIONAL TOTAL KNEE ARTHROPLASTY WITH ANTIBIOTIC SPACERS Right 06/20/2017   Procedure: EXCISION RIGHT TOTAL KNEE ARTHROPLASTY WITH ANTIBIOTIC SPACERS;  Surgeon: Vickey Huger, MD;  Location: [facility];  Service: Orthopedics;  Laterality: Right;  . JOINT REPLACEMENT    . POSTERIOR LUMBAR FUSION  11/2007   L4, L5   . POSTERIOR LUMBAR FUSION  01/2008     second surgery L2-L5 and S1  . SHOULDER ARTHROSCOPY W/ ROTATOR CUFF REPAIR Left 01/2017  . TOTAL KNEE ARTHROPLASTY Right 01/26/2013   Procedure: TOTAL KNEE ARTHROPLASTY;  Surgeon: Yvette Rack., MD;  Location: [facility];  Service: Orthopedics;  Laterality: Right;  . TUBAL LIGATION    . VAGINAL HYSTERECTOMY      OB History    No data available       Home Medications    Prior to Admission  medications   Medication Sig Start Date End Date Taking? Authorizing Provider  acetaminophen (TYLENOL) 500 MG tablet Take 2 tablets (1,000 mg total) by mouth every 8 (eight) hours as needed. 08/26/16  Yes Shawnee Knapp, MD  acyclovir (ZOVIRAX) 200 MG capsule TAKE TWO CAPSULES BY MOUTH THREE TIMES A DAY FOR 5 DAYS AS NEEDED FOR OUTBREAK 11/13/16  Yes English, Stephanie D, PA  aspirin EC 325 MG EC tablet Take 1 tablet (325 mg total) by mouth 2 (two) times daily. 06/21/17  Yes [name], [place]  aspirin  EC 81 MG tablet Take 81 mg by mouth daily.   Yes [provider]  B Complex-C (B-COMPLEX WITH VITAMIN C) tablet Take 1 tablet by mouth daily.   Yes [provider]  cefTRIAXone (ROCEPHIN) IVPB Inject 2 g into the vein daily. Indication: Septic Joint Last Day of Therapy:  3/25 Labs - Once weekly:  CBC/D and BMP, Labs - Every other week:  ESR and CRP 06/22/17  Yes Vickey Huger, MD  DULoxetine (CYMBALTA) 60 MG capsule TAKE ONE CAPSULE BY MOUTH TWICE A DAY **MUST CALL MD FOR APPOINTMENT 07/11/17  Yes Ivar Drape D, PA  gabapentin (NEURONTIN) 800 MG tablet Take 1 tablet (800 mg total) by mouth 4 (four) times daily. Office visit needed 05/02/17  Yes English, [place] D, PA  lisinopril-hydrochlorothiazide (PRINZIDE,ZESTORETIC) 20-25 MG tablet TAKE ONE TABLET BY MOUTH DAILY 09/03/16  Yes Shawnee Knapp, MD  metFORMIN (GLUCOPHAGE) 500 MG tablet TAKE ONE TABLET BY MOUTH EVERY NIGHT AT BEDTIME 09/03/16  Yes Shawnee Knapp, MD  methocarbamol (ROBAXIN) 500 MG tablet Take 1-2 tablets (500-1,000 mg total) by mouth every 6 (six) hours as needed for muscle spasms. 06/21/17  Yes [name], PA  Multiple Vitamin (MULTIVITAMIN) tablet Take 1 tablet by mouth daily.   Yes [provider]  nicotine polacrilex (NICORETTE MINI) 2 MG lozenge Take 2 mg by mouth as needed for smoking cessation.   Yes [provider]  oxyCODONE 10 MG TABS Take 1 tablet (10 mg total) by mouth every 4 (four) hours as needed for moderate pain ((score 4 to 6)). 06/21/17  Yes [name], PA  traZODone (DESYREL) 100 MG tablet TAKE ONE TABLET BY MOUTH THREE TIMES A DAY -- NEED OFFICE VISIT Patient taking differently: TAKE 200MG-373m BY MOUTH AT BEDTIME 01/20/17  Yes SWardell Honour MD  vancomycin IVPB Inject 1,000 mg into the vein every 12 (twelve) hours. Indication: Septic Joint Last Day of Therapy: 08/01/17 Labs - Sunday/Monday:  CBC/D, BMP, and vancomycin trough. Labs - Thursday:  BMP and  vancomycin trough Labs - Every other week:  ESR and CRP 06/22/17  Yes LVickey Huger MD    Family History Family History  Problem Relation Age of Onset  . Thyroid disease Mother   . Hypertension Father   . Anxiety disorder Sister   . Cancer Paternal Grandmother        prostate    Social History Social History   Tobacco Use  . Smoking status: Current Every Day Smoker    Packs/day: 0.50    Years: 15.00    Pack years: 7.50    Types: Cigarettes  . Smokeless tobacco: Never Used  Substance Use Topics  . 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  . Drug use: Yes    Types: Marijuana, Oxycodone, Hydrocodone    Comment: 2/11//2019 "  nothing in the last year"     Allergies   Celebrex [celecoxib] and Wellbutrin [bupropion]   Review of Systems Review of Systems  Constitutional: Positive for fatigue.  Gastrointestinal: Positive for abdominal pain, nausea and vomiting.  All other systems reviewed and are negative.    Physical Exam Updated Vital Signs BP (!) 99/56   Pulse 88   Temp 98.4 F (36.9 C) (Oral)   Resp 18   SpO2 99%   Physical Exam  Constitutional: She is oriented to person, place, and time. She appears well-developed and well-nourished. No distress.  Non toxic  HENT:  Head: Normocephalic and atraumatic.  Nose: Nose normal.  Mouth/Throat: No oropharyngeal exudate.  Dry mucous membranes  Eyes: Conjunctivae and EOM are normal. Pupils are equal, round, and reactive to light.  Neck: Normal range of motion.  Cardiovascular: Normal rate, regular rhythm and intact distal pulses.  No murmur heard. 2+ DP and radial pulses bilaterally. No LE edema.   Pulmonary/Chest: Effort normal and breath sounds normal. No respiratory distress. She has no wheezes. She has no rales.  Abdominal: Soft. Bowel sounds are normal. There is tenderness.  Mild epigastric abdominal pain. No G/R/R. No suprapubic or CVA  tenderness.   Musculoskeletal: Normal range of motion. She exhibits no deformity.  Neurological: She is alert and oriented to person, place, and time.  Skin: Skin is warm and dry. Capillary refill takes less than 2 seconds.  Psychiatric: She has a normal mood and affect. Her behavior is normal. Judgment and thought content normal.  Nursing note and vitals reviewed.    ED Treatments / Results  Labs (all labs ordered are listed, but only abnormal results are displayed) Labs Reviewed  BASIC METABOLIC PANEL - Abnormal; Notable for the following components:      Result Value   Sodium 129 (*)    Chloride 91 (*)    CO2 17 (*)    Glucose, Bld 437 (*)    Creatinine, Ser 1.17 (*)    GFR calc non Af Amer 53 (*)    Anion gap 21 (*)    All other components within normal limits  CBC - Abnormal; Notable for the following components:   WBC 12.1 (*)    All other components within normal limits  URINALYSIS, ROUTINE W REFLEX MICROSCOPIC - Abnormal; Notable for the following components:   Glucose, UA >=500 (*)    Ketones, ur 80 (*)    Protein, ur 30 (*)    Squamous Epithelial / LPF 0-5 (*)    All other components within normal limits  HEPATIC FUNCTION PANEL - Abnormal; Notable for the following components:   ALT 5 (*)    Total Bilirubin 1.4 (*)    Bilirubin, Direct <0.1 (*)    All other components within normal limits  LIPASE, BLOOD - Abnormal; Notable for the following components:   Lipase 87 (*)    All other components within normal limits  CBG MONITORING, ED - Abnormal; Notable for the following components:   Glucose-Capillary 437 (*)    All other components within normal limits  CBG MONITORING, ED - Abnormal; Notable for the following components:   Glucose-Capillary 490 (*)    All other components within normal limits  CBG MONITORING, ED - Abnormal; Notable for the following components:   Glucose-Capillary 306 (*)    All other components within normal limits  BLOOD GAS, VENOUS     EKG  EKG Interpretation None       Radiology No  results found.  Procedures Procedures (including critical care time)  Medications Ordered in ED Medications  0.9 %  sodium chloride infusion ( Intravenous Stopped 07/20/17 2150)  insulin regular (NOVOLIN R,HUMULIN R) 100 Units in sodium chloride 0.9 % 100 mL (1 Units/mL) infusion (not administered)  0.9 %  sodium chloride infusion (not administered)  dextrose 5 %-0.45 % sodium chloride infusion (not administered)  vancomycin IVPB (not administered)  cefTRIAXone (ROCEPHIN) 2 g in sodium chloride 0.9 % 100 mL IVPB (not administered)  sodium chloride 0.9 % bolus 1,000 mL (0 mLs Intravenous Stopped 07/20/17 2100)  insulin aspart (novoLOG) injection 8 Units (8 Units Subcutaneous Given 07/20/17 2000)     Initial Impression / Assessment and Plan / ED Course  I have reviewed the triage vital signs and the nursing notes.  Pertinent labs & imaging results that were available during my care of the patient were reviewed by me and considered in my medical decision making (see chart for details).  Clinical Course as of Jul 21 2214  Wed Jul 20, 2017  1906 Sodium: (!) 129 [CG]  1906 Glucose: (!) 437 [CG]  1906 Creatinine: (!) 1.17 [CG]  1906 Anion gap: (!) 21 [CG]  1906 CO2: (!) 17 [CG]  2017 Lipase: (!) 87 [CG]  2017 Glucose: (!) >=500 [CG]  2017 Ketones, ur: (!) 80 [CG]    Clinical Course User Index [CG] Kinnie Feil, PA-C   Pt is a 52 y.o. female with hx diabetes on metformin only here with hyperglycemia. Nontoxic appearing, she appears mildly dehydrated. VS WNL. Afebrile. Cardiopulmonary exam benign Abdomen soft but diffusely tender, no signs of peritonitis. Initial CBG 437. Potassium 4.2. Na 129.  Gap 21. Bicarb 17. Mild AKI, creatinine 1.17. UA with ketones but not infected. Patient given insulin, IVF and started on glucostabilizer in ED. ED lab work consistent with DKA without coma, plan is to request admission. Of note, pt  had recent R knee arthroplasty c/b infection currently getting abx through [facility]. WBC 12.1 today.     Final Clinical Impressions(s) / ED Diagnoses   Final diagnoses:  Diabetic ketoacidosis without coma associated with type 2 diabetes mellitus [facility])    ED Discharge Orders    None       Arlean Hopping 07/20/17 2216    Dorie Rank, MD 07/22/17 [phone]

## 2017-07-21 ENCOUNTER — Other Ambulatory Visit: Payer: Self-pay

## 2017-07-21 ENCOUNTER — Observation Stay (HOSPITAL_COMMUNITY): Payer: Commercial Managed Care - PPO

## 2017-07-21 DIAGNOSIS — I10 Essential (primary) hypertension: Secondary | ICD-10-CM

## 2017-07-21 DIAGNOSIS — M1991 Primary osteoarthritis, unspecified site: Secondary | ICD-10-CM | POA: Diagnosis present

## 2017-07-21 DIAGNOSIS — Z981 Arthrodesis status: Secondary | ICD-10-CM | POA: Diagnosis not present

## 2017-07-21 DIAGNOSIS — M01X0 Direct infection of unspecified joint in infectious and parasitic diseases classified elsewhere: Secondary | ICD-10-CM

## 2017-07-21 DIAGNOSIS — E871 Hypo-osmolality and hyponatremia: Secondary | ICD-10-CM | POA: Diagnosis present

## 2017-07-21 DIAGNOSIS — G8929 Other chronic pain: Secondary | ICD-10-CM | POA: Diagnosis present

## 2017-07-21 DIAGNOSIS — Z888 Allergy status to other drugs, medicaments and biological substances status: Secondary | ICD-10-CM | POA: Diagnosis not present

## 2017-07-21 DIAGNOSIS — Z7982 Long term (current) use of aspirin: Secondary | ICD-10-CM | POA: Diagnosis not present

## 2017-07-21 DIAGNOSIS — Z881 Allergy status to other antibiotic agents status: Secondary | ICD-10-CM | POA: Diagnosis not present

## 2017-07-21 DIAGNOSIS — A498 Other bacterial infections of unspecified site: Secondary | ICD-10-CM | POA: Diagnosis not present

## 2017-07-21 DIAGNOSIS — E1110 Type 2 diabetes mellitus with ketoacidosis without coma: Secondary | ICD-10-CM | POA: Diagnosis present

## 2017-07-21 DIAGNOSIS — N179 Acute kidney failure, unspecified: Secondary | ICD-10-CM | POA: Diagnosis present

## 2017-07-21 DIAGNOSIS — E860 Dehydration: Secondary | ICD-10-CM | POA: Diagnosis present

## 2017-07-21 DIAGNOSIS — Z89421 Acquired absence of other right toe(s): Secondary | ICD-10-CM | POA: Diagnosis not present

## 2017-07-21 DIAGNOSIS — E119 Type 2 diabetes mellitus without complications: Secondary | ICD-10-CM | POA: Diagnosis not present

## 2017-07-21 DIAGNOSIS — Z90710 Acquired absence of both cervix and uterus: Secondary | ICD-10-CM | POA: Diagnosis not present

## 2017-07-21 DIAGNOSIS — F17210 Nicotine dependence, cigarettes, uncomplicated: Secondary | ICD-10-CM | POA: Diagnosis present

## 2017-07-21 DIAGNOSIS — F1021 Alcohol dependence, in remission: Secondary | ICD-10-CM | POA: Diagnosis present

## 2017-07-21 DIAGNOSIS — Z8249 Family history of ischemic heart disease and other diseases of the circulatory system: Secondary | ICD-10-CM | POA: Diagnosis not present

## 2017-07-21 DIAGNOSIS — Z79899 Other long term (current) drug therapy: Secondary | ICD-10-CM | POA: Diagnosis not present

## 2017-07-21 DIAGNOSIS — T8453XA Infection and inflammatory reaction due to internal right knee prosthesis, initial encounter: Secondary | ICD-10-CM | POA: Diagnosis present

## 2017-07-21 DIAGNOSIS — M009 Pyogenic arthritis, unspecified: Secondary | ICD-10-CM | POA: Diagnosis present

## 2017-07-21 LAB — BASIC METABOLIC PANEL
ANION GAP: 10 (ref 5–15)
ANION GAP: 11 (ref 5–15)
ANION GAP: 14 (ref 5–15)
BUN: 14 mg/dL (ref 6–20)
BUN: 15 mg/dL (ref 6–20)
BUN: 17 mg/dL (ref 6–20)
CHLORIDE: 100 mmol/L — AB (ref 101–111)
CHLORIDE: 100 mmol/L — AB (ref 101–111)
CHLORIDE: 101 mmol/L (ref 101–111)
CO2: 21 mmol/L — ABNORMAL LOW (ref 22–32)
CO2: 22 mmol/L (ref 22–32)
CO2: 22 mmol/L (ref 22–32)
Calcium: 8.3 mg/dL — ABNORMAL LOW (ref 8.9–10.3)
Calcium: 8.6 mg/dL — ABNORMAL LOW (ref 8.9–10.3)
Calcium: 9 mg/dL (ref 8.9–10.3)
Creatinine, Ser: 0.74 mg/dL (ref 0.44–1.00)
Creatinine, Ser: 0.75 mg/dL (ref 0.44–1.00)
Creatinine, Ser: 0.76 mg/dL (ref 0.44–1.00)
GFR calc Af Amer: >60 mL/min (ref >60)
GFR calc Af Amer: >60 mL/min (ref >60)
GFR calc non Af Amer: >60 mL/min (ref >60)
GFR calc non Af Amer: >60 mL/min (ref >60)
Glucose, Bld: 185 mg/dL — ABNORMAL HIGH (ref 65–99)
Glucose, Bld: 196 mg/dL — ABNORMAL HIGH (ref 65–99)
Glucose, Bld: 226 mg/dL — ABNORMAL HIGH (ref 65–99)
POTASSIUM: 3 mmol/L — AB (ref 3.5–5.1)
POTASSIUM: 3.3 mmol/L — AB (ref 3.5–5.1)
POTASSIUM: 3.4 mmol/L — AB (ref 3.5–5.1)
SODIUM: 132 mmol/L — AB (ref 135–145)
SODIUM: 135 mmol/L (ref 135–145)
Sodium: 134 mmol/L — ABNORMAL LOW (ref 135–145)

## 2017-07-21 LAB — CBC
HEMATOCRIT: 33.7 % — AB (ref 36.0–46.0)
HEMOGLOBIN: 11.4 g/dL — AB (ref 12.0–15.0)
MCH: 31.5 pg (ref 26.0–34.0)
MCHC: 33.8 g/dL (ref 30.0–36.0)
MCV: 93.1 fL (ref 78.0–100.0)
Platelets: 257 10*3/uL (ref 150–400)
RBC: 3.62 MIL/uL — AB (ref 3.87–5.11)
RDW: 12.3 % (ref 11.5–15.5)
WBC: 8.6 10*3/uL (ref 4.0–10.5)

## 2017-07-21 LAB — GLUCOSE, CAPILLARY
GLUCOSE-CAPILLARY: 240 mg/dL — AB (ref 65–99)
Glucose-Capillary: 200 mg/dL — ABNORMAL HIGH (ref 65–99)
Glucose-Capillary: 271 mg/dL — ABNORMAL HIGH (ref 65–99)

## 2017-07-21 LAB — TROPONIN I
Troponin I: <0.03 ng/mL (ref <0.03)
Troponin I: <0.03 ng/mL (ref <0.03)
Troponin I: <0.03 ng/mL (ref <0.03)

## 2017-07-21 LAB — CBG MONITORING, ED
GLUCOSE-CAPILLARY: 151 mg/dL — AB (ref 65–99)
GLUCOSE-CAPILLARY: 169 mg/dL — AB (ref 65–99)
GLUCOSE-CAPILLARY: 232 mg/dL — AB (ref 65–99)
GLUCOSE-CAPILLARY: 216 mg/dL — AB (ref 65–99)
GLUCOSE-CAPILLARY: 231 mg/dL — AB (ref 65–99)
Glucose-Capillary: 170 mg/dL — ABNORMAL HIGH (ref 65–99)

## 2017-07-21 LAB — HEMOGLOBIN A1C
Hgb A1c MFr Bld: 9.2 % — ABNORMAL HIGH (ref 4.8–5.6)
MEAN PLASMA GLUCOSE: 217.34 mg/dL

## 2017-07-21 MED ORDER — INSULIN ASPART 100 UNIT/ML ~~LOC~~ SOLN
6.0000 [IU] | Freq: Three times a day (TID) | SUBCUTANEOUS | Status: DC
  Administered 2017-07-21 – 2017-07-22 (×4): 6 [IU] via SUBCUTANEOUS

## 2017-07-21 MED ORDER — IOPAMIDOL (ISOVUE-300) INJECTION 61%
30.0000 mL | Freq: Once | INTRAVENOUS | Status: AC | PRN
  Administered 2017-07-21: 30 mL via ORAL

## 2017-07-21 MED ORDER — INSULIN GLARGINE 100 UNIT/ML ~~LOC~~ SOLN
20.0000 [IU] | Freq: Every day | SUBCUTANEOUS | Status: DC
  Administered 2017-07-21: 20 [IU] via SUBCUTANEOUS
  Filled 2017-07-21 (×2): qty 0.2

## 2017-07-21 MED ORDER — POTASSIUM CHLORIDE CRYS ER 20 MEQ PO TBCR
40.0000 meq | EXTENDED_RELEASE_TABLET | ORAL | Status: AC
  Administered 2017-07-21 (×2): 40 meq via ORAL
  Filled 2017-07-21 (×2): qty 2

## 2017-07-21 MED ORDER — IOPAMIDOL (ISOVUE-300) INJECTION 61%
INTRAVENOUS | Status: AC
  Administered 2017-07-21: 30 mL via ORAL
  Filled 2017-07-21: qty 30

## 2017-07-21 MED ORDER — VANCOMYCIN HCL 10 G IV SOLR
1500.0000 mg | Freq: Two times a day (BID) | INTRAVENOUS | Status: DC
  Administered 2017-07-21 – 2017-07-22 (×3): 1500 mg via INTRAVENOUS
  Filled 2017-07-21 (×3): qty 1500

## 2017-07-21 MED ORDER — INSULIN ASPART 100 UNIT/ML ~~LOC~~ SOLN
0.0000 [IU] | Freq: Three times a day (TID) | SUBCUTANEOUS | Status: DC
  Administered 2017-07-21: 3 [IU] via SUBCUTANEOUS
  Administered 2017-07-21 (×2): 2 [IU] via SUBCUTANEOUS
  Administered 2017-07-22: 3 [IU] via SUBCUTANEOUS
  Administered 2017-07-22: 5 [IU] via SUBCUTANEOUS

## 2017-07-21 MED ORDER — LIVING WELL WITH DIABETES BOOK
Freq: Once | Status: DC
  Filled 2017-07-21: qty 1

## 2017-07-21 MED ORDER — VANCOMYCIN IV (FOR PTA / DISCHARGE USE ONLY): 1000.0000 mg | Freq: Two times a day (BID) | INTRAVENOUS | Status: DC

## 2017-07-21 MED ORDER — INSULIN STARTER KIT- PEN NEEDLES (ENGLISH)
1.0000 | Freq: Once | Status: DC
  Filled 2017-07-21: qty 1

## 2017-07-21 NOTE — Progress Notes (Signed)
Inpatient Diabetes Program Recommendations  AACE/ADA: New Consensus Statement on Inpatient Glycemic Control (2015)  Target Ranges:  Prepandial:   less than 140 mg/dL      Peak postprandial:   less than 180 mg/dL (1-2 hours)      Critically ill patients:  140 - 180 mg/dL   Lab Results  Component Value Date   GLUCAP 240 (H) 07/21/2017   HGBA1C 9.2 (H) 07/21/2017    Review of Glycemic Control  Diabetes history: DM2 Outpatient Diabetes medications: metformin 500 mg QHS Current orders for Inpatient glycemic control: Lantus 20 units QD, Novolog 0-9 units tidwc + 6 units tidwc  HgbA1C - 9.2%. Ordered insulin pen starter kit and Living Well book.  Inpatient Diabetes Program Recommendations:      Add HS correction  Spoke with pt regarding her HgbA1C of 9.2%. Discussed going home on insulin. Pt states she has been inactive d/t knee problems and has not been following a good diet. States she stays hungry and eats large portion sizes, but usually eats healthy foods. Demonstrated insulin pen use and pt states she would agree to going home on insulin - using a pen. Has glucose meter at home. Will f/u in am and allow pt to practice with insulin pen. Seems motivated to make lifestyle changes to control blood sugars. Will follow-up in am.  Thank you. Lorenda Peck, RD, LDN, CDE Inpatient Diabetes Coordinator [phone]

## 2017-07-21 NOTE — Plan of Care (Signed)
  RD consulted for nutrition education regarding diabetes.   Lab Results  Component Value Date   HGBA1C 9.2 (H) 07/21/2017    RD provided "Carbohydrate Counting for People with Diabetes" handout from the Academy of Nutrition and Dietetics. Discussed different food groups and their effects on blood sugar, emphasizing carbohydrate-containing foods. Provided list of carbohydrates and recommended serving sizes of common foods.  Discussed importance of controlled and consistent carbohydrate intake throughout the day. Provided examples of ways to balance meals/snacks and encouraged intake of high-fiber, whole grain complex carbohydrates. Teach back method used.  Expect fair compliance. Reviewed carb choices on lunch tray during visit. States her husband does the cooking at home, husband now present during education.  Body mass index is 34.96 kg/m. Pt meets criteria for obesity based on current BMI.  Current diet order is Heart Healthy/CHO modified. Patient reports feeling hungry today. Labs and medications reviewed. No further nutrition interventions warranted at this time. If additional nutrition issues arise, please re-consult RD.  Tilda Franco, MS, RD, LDN [facility] Inpatient Clinical Dietitian Pager: [phone] After Hours Pager: [phone]

## 2017-07-21 NOTE — Progress Notes (Signed)
Progress Note   Brittney Sawyer FMB:[id number] DOB: [birth year] DOA: 07/20/2017 PCP: Devra Dopp, MD   LOS: 0 days    Brief Narrative:  Brittney Sawyer is a 52 year old female with a medical history significant for diabetes mellitus type II, HTN, and currently being treated for septic arthritis with ceftriaxone and vancomycin, who presented to the ED on 07/20/2017 due to persistent hyperglycemia x 3 weeks. Patient states she has developed abdominal pain for the last week which has been associated with nausea and vomiting. Upon presentation to the ED, patient was afebrile at 98.4, tachycardic 106, RR 18, BP 103/69. Initial labs demonstrated sodium 129, chloride 91, bicarb 17, glucose 437, Cr. 1.17, anion gap 21, elevated lipase 87, ALT 5, WBC 12.1. UA demonstrated proteinuria of 30, >500 glucose, positive for ketones- 80. Patient was started on fluid bolus for DKA and insulin infusion.  Patient was admitted to the hospital on the working diagnosis of diabetic ketoacidosis due to poorly controlled diabetes mellitus type II.   Assessment/Plan:   Principal Problem:   DKA (diabetic ketoacidoses) ([facility]) Active Problems:   HTN (hypertension)   Chronic pain   Septic joint ([facility])   S/P revision of total knee  1. Diabetic Ketoacidosis-Resolved: Insulin infusion was discontinued this morning. Start patient on Lantus 20 units with Novolog sliding scale. Discontinue IVFs and encourage oral hydration. Replete potassium with 40 mEq tablet q 4 hours x 2 doses. Check BMP tomorrow am to monitor potassium levels. Consult diabetes coordinator and dietitian.   2. Acute kidney injury- Resolved: Newest BMP demonstrate BUN 14 and Cr. 0.75. Will replete potassium levels as noted above and repeat BMP tomorrow am.  3. Septic Joint: Continue IV rocephin and IV vancomycin as scheduled. Knee appears to be healing well with very mild edema, no warmth, and no effusion.  4. Hypertension: BP still relatively low- continue  to hold outpatient BP medication. Monitor BP levels and re-continue if BP levels increase.  Family Communication/Anticipated D/C date and plan/Code Status   DVT prophylaxis: Lovenox Code Status: Full Family Communication: No family present at bedside Disposition Plan: Home   Medical Consultants:      Antimicrobials:  IV vancomycin IV Rocephin   Subjective:  Patient is awake, alert, and oriented. She is in no acute distress. Abdominal pain has improved. Patient denies chest pain, shortness of breath, or GI symptoms.   Objective:   Vitals:   07/21/17 0505 07/21/17 0632 07/21/17 0700 07/21/17 0747  BP: (!) 88/50 108/77 (!) 88/55 96/60  Pulse: 97 80 67 67  Resp: 20 12  16   Temp:      TempSrc:      SpO2: 95% 94% 96% 98%    Intake/Output Summary (Last 24 hours) at 07/21/2017 1024 Last data filed at 07/21/2017 7017 Gross per 24 hour  Intake 1000 ml  Output -  Net 1000 ml   There were no vitals filed for this visit.   Physical Exam:   Constitutional: NAD, awake and alert Eyes: lids and conjunctivae normal ENMT: Mucous membranes are moist.  Respiratory: clear to auscultation bilaterally, no wheezing, no crackles. Normal respiratory effort. No accessory muscle use.  Cardiovascular: Regular rate and rhythm, normal S1-S2, no murmurs / rubs / gallops. No LE edema.  Abdomen: soft, nondistended, mild epigastric tenderness. Musculoskeletal: no clubbing / cyanosis. Right knee mildly edematous. No signs of active infection. No joint effusion. Skin: no rashes, lesions, ulcers.  Neurologic: Non-focal Psychiatric: Alert and oriented x 3  Data Reviewed:   I have independently reviewed following labs and imaging studies:   CBC: Recent Labs  Lab Aug 12, 2017 1637 12-Aug-2017 2345  WBC 12.1* 8.6  HGB 14.1 11.4*  HCT 41.2 33.7*  MCV 94.9 93.1  PLT 297 257   Basic Metabolic Panel: Recent Labs  Lab 08-12-2017 1637 2017/08/12 2345 07/21/17 0605 07/21/17 0939  NA 129* 134*  132* 135  K 4.2 3.4* 3.0* 3.3*  CL 91* 101 100* 100*  CO2 17* 22 22 21*  GLUCOSE 437* 196* 226* 185*  BUN 17 17 15 14   CREATININE 1.17* 0.76 0.74 0.75  CALCIUM 9.8 8.6* 8.3* 9.0   GFR: CrCl cannot be calculated (Unknown ideal weight.). Liver Function Tests: Recent Labs  Lab Aug 12, 2017 1632  AST 18  ALT 5*  ALKPHOS 103  BILITOT 1.4*  PROT 7.8  ALBUMIN 4.5   Recent Labs  Lab 2017-08-12 1632  LIPASE 87*   No results for input(s): AMMONIA in the last 168 hours. Coagulation Profile: No results for input(s): INR, PROTIME in the last 168 hours. Cardiac Enzymes: Recent Labs  Lab 07/21/17 0144 07/21/17 0605  TROPONINI <0.03 <0.03   BNP (last 3 results) No results for input(s): PROBNP in the last 8760 hours. HbA1C: No results for input(s): HGBA1C in the last 72 hours. CBG: Recent Labs  Lab 07/21/17 0255 07/21/17 0415 07/21/17 0516 07/21/17 0611 07/21/17 0720  GLUCAP 169* 232* 216* 231* 170*   Lipid Profile: No results for input(s): CHOL, HDL, LDLCALC, TRIG, CHOLHDL, LDLDIRECT in the last 72 hours. Thyroid Function Tests: No results for input(s): TSH, T4TOTAL, FREET4, T3FREE, THYROIDAB in the last 72 hours. Anemia Panel: No results for input(s): VITAMINB12, FOLATE, FERRITIN, TIBC, IRON, RETICCTPCT in the last 72 hours. Urine analysis:    Component Value Date/Time   COLORURINE YELLOW 08/12/2017 1903   APPEARANCEUR CLEAR 12-Aug-2017 1903   LABSPEC 1.020 12-Aug-2017 1903   PHURINE 6.0 08/12/2017 1903   GLUCOSEU >=500 (A) Aug 12, 2017 1903   HGBUR NEGATIVE Aug 12, 2017 1903   BILIRUBINUR NEGATIVE 12-Aug-2017 1903   BILIRUBINUR neg 11/25/2011 1038   KETONESUR 80 (A) 08-12-17 1903   PROTEINUR 30 (A) 08-12-17 1903   UROBILINOGEN 0.2 08/14/2013 0337   NITRITE NEGATIVE 08/12/2017 1903   LEUKOCYTESUR NEGATIVE 08-12-2017 1903   Sepsis Labs: Invalid input(s): PROCALCITONIN, LACTICIDVEN  No results found for this or any previous visit (from the past 240 hour(s)).     Radiology Studies: Ct Abdomen Pelvis Wo Contrast  Result Date: 07/21/2017 CLINICAL DATA:  Abdominal pain with nausea and vomiting EXAM: CT ABDOMEN AND PELVIS WITHOUT CONTRAST TECHNIQUE: Multidetector CT imaging of the abdomen and pelvis was performed following the standard protocol without IV contrast. COMPARISON:  CT pelvis 08/14/2013 FINDINGS: Lower chest: No basilar pulmonary nodules or pleural effusion. No apical pericardial effusion. Hepatobiliary: Normal hepatic contours and density. No visible biliary dilatation. Normal gallbladder. Pancreas: Normal parenchymal contours without ductal dilatation. No peripancreatic fluid collection. Spleen: Normal. Adrenals/Urinary Tract: --Adrenal glands: Normal. --Right kidney/ureter: No hydronephrosis, perinephric stranding or nephrolithiasis. No obstructing ureteral stones. --Left kidney/ureter: Left lower pole renal calculus measures 9 mm. --Urinary bladder: Normal appearance for the degree of distention. Stomach/Bowel: --Stomach/Duodenum: No hiatal hernia or other gastric abnormality. Normal duodenal course. --Small bowel: No dilatation or inflammation. --Colon: No focal abnormality.  Stool throughout the colon. --Appendix: Normal. Vascular/Lymphatic: Normal course and caliber of the major abdominal vessels. No abdominal or pelvic lymphadenopathy. Reproductive: Bilateral tubal ligation.  Status post hysterectomy. Musculoskeletal. L4-S1 fusion hardware. Other: None. IMPRESSION: 1.  No acute abdominal or pelvic abnormality. 2. Stool throughout the entire nondilated colon. 3. 9 mm left lower pole nonobstructive renal calculus. Electronically Signed   By: Deatra Robinson M.D.   On: 07/21/2017 04:22      Medication:   . aspirin EC  81 mg Oral Daily  . DULoxetine  60 mg Oral BID  . enoxaparin (LOVENOX) injection  40 mg Subcutaneous Q24H  . gabapentin  800 mg Oral QID  . insulin aspart  0-9 Units Subcutaneous TID WC  . insulin aspart  6 Units Subcutaneous TID  WC  . insulin glargine  20 Units Subcutaneous Daily  . potassium chloride  40 mEq Oral Q4H  . traZODone  300 mg Oral QHS    Continuous Infusions: . sodium chloride Stopped (07/21/17 0728)  . sodium chloride    . cefTRIAXone (ROCEPHIN)  IV Stopped (07/20/17 2315)  . insulin (NOVOLIN-R) infusion Stopped (07/21/17 0729)  . vancomycin        Time spent: 25 minutes  Signed, Caprice Renshaw, PA-S [employer] Email: [email or web address]

## 2017-07-21 NOTE — ED Notes (Signed)
Pt refusing to keep cardiac monitor or BP cuff on.

## 2017-07-21 NOTE — Progress Notes (Signed)
Pharmacy Antibiotic Note  Brittney Sawyer is a 52 y.o. female admitted on 07/20/2017 with DKA. Patient had been on antibiotic therapy with Ceftriaxone and Vancomycin PTA for infected right total knee arthroplasty (planned duration of 6 weeks to end on 3/25). Patient was being followed by [facility] outpatient for Vancomycin monitoring. Pharmacy has been consulted for Vancomycin dosing while patient admitted.    Per discussion with [facility] Pharmacist, Larita Fife, previous dosing history/levels are as follows: 2/18: Vancomycin trough level = 6.9 mcg/mL on 1g IV q12h, SCr 0.74 2/25: Vancomycin trough level = 10.2 mcg/mL on 1500mg  IV q12h 3/4: Vancomycin trough level = 20 mcg/mL on 2g IV q12h, SCr 0.93, dose decreased to 1750mg  IV q12h --- 3/13: Vancomycin random level on admit = 22 mcg/mL (~ 13.5 hours after last dose of 1750mg , SCr elevated on admission at 1.17  Today, 07/21/17:  -SCr returned to baseline, 0.75 -Afebrile -WBC WNL (3/13 PM)   Plan: Resume Vancomycin at lower dose of 1500mg  IV q12h. Monitor renal function closely, clinical course, Vancomycin trough level at new steady state.    Temp (24hrs), Avg:98.4 F (36.9 C), Min:98.4 F (36.9 C), Max:98.4 F (36.9 C)  Recent Labs  Lab 07/20/17 1637 07/20/17 2229 07/20/17 2345 07/21/17 0605 07/21/17 0939  WBC 12.1*  --  8.6  --   --   CREATININE 1.17*  --  0.76 0.74 0.75  VANCOTROUGH  --  22*  --   --   --     CrCl cannot be calculated (Unknown ideal weight.).    Allergies  Allergen Reactions  . Celebrex [Celecoxib] Swelling    UNSPECIFIED AREA  . Wellbutrin [Bupropion] Rash    Antimicrobials this admission:  PTA Vancomycin resumed 3/13 >> PTA Ceftriaxone resumed 3/13 >>   Dose adjustments this admission:  See above  Microbiology results: none  Thank you for allowing pharmacy to be a part of this patient's care.   Greer Pickerel, PharmD, BCPS Pager: [phone] 07/21/2017 10:41 AM

## 2017-07-21 NOTE — Progress Notes (Signed)
Pharmacy Antibiotic Note  Brittney Sawyer is a 52 y.o. female admitted on 07/20/2017 with Infected right total knee arthroplasty.  Pharmacy has been consulted for vancomycin dosing.  Vancomycin random level on admit = 22.  Plan: Repeat vancomycin random level at 1200 3/14. Will contact [facility] to obtain prior levels, dosing and SCr.     Temp (24hrs), Avg:98.4 F (36.9 C), Min:98.4 F (36.9 C), Max:98.4 F (36.9 C)  Recent Labs  Lab 07/20/17 1637 07/20/17 2229 07/20/17 2345  WBC 12.1*  --  8.6  CREATININE 1.17*  --  0.76  VANCOTROUGH  --  22*  --     CrCl cannot be calculated (Unknown ideal weight.).    Allergies  Allergen Reactions  . Celebrex [Celecoxib] Swelling    UNSPECIFIED AREA  . Wellbutrin [Bupropion] Rash    Antimicrobials this admission:  Vancomycin 07/21/2017 >> Ceftriaxone 07/21/2017 >>   Dose adjustments this admission:  VR 3/13 @ 2229 = 22 VR 3/14 @ 1200 = _______  Microbiology results: pending  Thank you for allowing pharmacy to be a part of this patient's care.  Aleene Davidson Crowford 07/21/2017 1:15 AM

## 2017-07-21 NOTE — ED Notes (Signed)
Offered trazodone 300mg , pt states she would like to wait until she is ready to go to sleep. Explained to patient that medication cannot be left at the bedside and that she may request the medication when she is ready for it.

## 2017-07-22 DIAGNOSIS — E119 Type 2 diabetes mellitus without complications: Secondary | ICD-10-CM

## 2017-07-22 LAB — GLUCOSE, CAPILLARY
GLUCOSE-CAPILLARY: 293 mg/dL — AB (ref 65–99)
Glucose-Capillary: 245 mg/dL — ABNORMAL HIGH (ref 65–99)

## 2017-07-22 LAB — CREATININE, SERUM
CREATININE: 0.68 mg/dL (ref 0.44–1.00)
GFR calc Af Amer: >60 mL/min (ref >60)

## 2017-07-22 MED ORDER — LISINOPRIL 20 MG PO TABS: 20.0000 mg | ORAL_TABLET | Freq: Every day | ORAL | 0 refills | Status: DC

## 2017-07-22 MED ORDER — CEFTRIAXONE IV (FOR PTA / DISCHARGE USE ONLY): 2.0000 g | INTRAVENOUS | 0 refills | Status: DC

## 2017-07-22 MED ORDER — INSULIN GLARGINE 100 UNIT/ML SOLOSTAR PEN: 25.0000 [IU] | PEN_INJECTOR | Freq: Every day | SUBCUTANEOUS | 0 refills | Status: DC

## 2017-07-22 MED ORDER — METFORMIN HCL 500 MG PO TABS: 500.0000 mg | ORAL_TABLET | Freq: Two times a day (BID) | ORAL | 0 refills | Status: DC

## 2017-07-22 MED ORDER — VANCOMYCIN IV (FOR PTA / DISCHARGE USE ONLY): 1500.0000 mg | Freq: Two times a day (BID) | INTRAVENOUS | 0 refills | Status: DC

## 2017-07-22 MED ORDER — INSULIN GLARGINE 100 UNIT/ML ~~LOC~~ SOLN
25.0000 [IU] | Freq: Every day | SUBCUTANEOUS | Status: DC
  Administered 2017-07-22: 25 [IU] via SUBCUTANEOUS
  Filled 2017-07-22: qty 0.25

## 2017-07-22 MED ORDER — INSULIN PEN NEEDLE 32G X 8 MM MISC: 0 refills | Status: DC

## 2017-07-22 MED ORDER — INSULIN ASPART 100 UNIT/ML ~~LOC~~ SOLN
8.0000 [IU] | Freq: Three times a day (TID) | SUBCUTANEOUS | Status: DC
  Administered 2017-07-22: 8 [IU] via SUBCUTANEOUS

## 2017-07-22 MED ORDER — INSULIN ASPART 100 UNIT/ML FLEXPEN: 8.0000 [IU] | PEN_INJECTOR | Freq: Three times a day (TID) | SUBCUTANEOUS | 0 refills | Status: DC

## 2017-07-22 NOTE — Discharge Summary (Signed)
PATIENT DETAILS Name: Brittney Sawyer Age: 52 y.o. Sex: female Date of Birth: February 14, 1966 MRN: [id number]. Admitting Physician: Jonetta Osgood, MD [id number], Bryn Gulling, MD  Admit Date: 07/20/2017 Discharge date: 07/22/2017  Recommendations for Outpatient Follow-up:  1. Follow up with PCP in 1-2 weeks 2. Please resume IV Vanco/Rocephin-end date 3/25  Admitted From:  Home with home health service  Disposition: Home with home health services  Home Health: Yes  Equipment/Devices: None  Discharge Condition: Stable  CODE STATUS: FULL CODE  Diet recommendation:  Heart Healthy / Carb Modified   Brief Summary: See H&P, Labs, Consult and Test reports for all details in brief, Patient is 52 year old female with history of recurrent right knee septic arthritis-currently on antimicrobial therapy in the outpatient setting-has a history of DM-2-admitted with symptomatic hyperglycemia-further evaluation revealed DKA.Started on a insulin infusion and admitted to the hospitalist service. See below for further details.  Brief Hospital Course: Diabetic ketoacidosis: Started on insulin infusion along with IV fluids on admission-once anion gap was closed, she was transitioned to subcutaneous insulin.  See below   DM-2:On metformin prior to this hospital stay-since presented with DKA-and A1c levels are around 9-patient will be discharged on Lantus and NovoLog regimen.  Lantus on the day of discharge has been increased to 25 units, NovoLog has been increased to 8 units with meals.  Patient has been asked to keep a record of her CBGs, and take these readings to her primary care practitioner's office at next follow-up.  Septic arthritis of right knee: Patient already was on intravenous vancomycin and Rocephin before this hospitalization-this is been continued.  At the recommendation of the pharmacy team-vancomycin dosage has been adjusted to 1500 mg twice  daily.  Procedures/Studies: None  Discharge Diagnoses:  Principal Problem:   DKA (diabetic ketoacidoses) ([place]) Active Problems:   HTN (hypertension)   Chronic pain   Septic joint (HCC)   S/P revision of total knee   Discharge Instructions:  Activity:  No weightbearing to the right lower extremity.  Discharge Instructions    Ambulatory referral to Nutrition and Diabetic Education   Complete by:  As directed    Call MD for:  persistant dizziness or light-headedness   Complete by:  As directed    Call MD for:  persistant nausea and vomiting   Complete by:  As directed    Diet - low sodium heart healthy   Complete by:  As directed    Diet general   Complete by:  As directed    Discharge instructions   Complete by:  As directed    Follow with Primary MD  Helane Rima, MD in 1 week  Please keep a log of your CBG's-and take it to your Primary Care MD's office at next follow up visit  Please get a complete blood count and chemistry panel checked by your Primary MD at your next visit, and again as instructed by your Primary MD.  Get Medicines reviewed and adjusted: Please take all your medications with you for your next visit with your Primary MD  Laboratory/radiological data: Please request your Primary MD to go over all hospital tests and procedure/radiological results at the follow up, please ask your Primary MD to get all Hospital records sent to his/her office.  In some cases, they will be blood work, cultures and biopsy results pending at the time of your discharge. Please request that your primary care M.D. follows up on these results.  Also Note the following: If you experience  worsening of your admission symptoms, develop shortness of breath, life threatening emergency, suicidal or homicidal thoughts you must seek medical attention immediately by calling 911 or calling your MD immediately  if symptoms less severe.  You must read complete instructions/literature  along with all the possible adverse reactions/side effects for all the Medicines you take and that have been prescribed to you. Take any new Medicines after you have completely understood and accpet all the possible adverse reactions/side effects.   Do not drive when taking Pain medications or sleeping medications (Benzodaizepines)  Do not take more than prescribed Pain, Sleep and Anxiety Medications. It is not advisable to combine anxiety,sleep and pain medications without talking with your primary care practitioner  Special Instructions: If you have smoked or chewed Tobacco  in the last 2 yrs please stop smoking, stop any regular Alcohol  and or any Recreational drug use.  Wear Seat belts while driving.  Please note: You were cared for by a hospitalist during your hospital stay. Once you are discharged, your primary care physician will handle any further medical issues. Please note that NO REFILLS for any discharge medications will be authorized once you are discharged, as it is imperative that you return to your primary care physician (or establish a relationship with a primary care physician if you do not have one) for your post hospital discharge needs so that they can reassess your need for medications and monitor your lab values.   Increase activity slowly   Complete by:  As directed      Allergies as of 07/22/2017      Reactions   Celebrex [celecoxib] Swelling   UNSPECIFIED AREA   Wellbutrin [bupropion] Rash      Medication List    TAKE these medications   acetaminophen 500 MG tablet Commonly known as:  TYLENOL Take 2 tablets (1,000 mg total) by mouth every 8 (eight) hours as needed.   acyclovir 200 MG capsule Commonly known as:  ZOVIRAX TAKE TWO CAPSULES BY MOUTH THREE TIMES A DAY FOR 5 DAYS AS NEEDED FOR OUTBREAK   aspirin EC 81 MG tablet Take 81 mg by mouth daily.   aspirin 325 MG EC tablet Take 1 tablet (325 mg total) by mouth 2 (two) times daily.   B-complex with  vitamin C tablet Take 1 tablet by mouth daily.   cefTRIAXone IVPB Commonly known as:  ROCEPHIN Inject 2 g into the vein daily. Indication: Septic Joint Last Day of Therapy:  3/25 Labs - Once weekly:  CBC/D and BMP, Labs - Every other week:  ESR and CRP   DULoxetine 60 MG capsule Commonly known as:  CYMBALTA TAKE ONE CAPSULE BY MOUTH TWICE A DAY **MUST CALL MD FOR APPOINTMENT   gabapentin 800 MG tablet Commonly known as:  NEURONTIN Take 1 tablet (800 mg total) by mouth 4 (four) times daily. Office visit needed   insulin aspart 100 UNIT/ML FlexPen Commonly known as:  NOVOLOG FLEXPEN Inject 8 Units into the skin 3 (three) times daily with meals.   Insulin Glargine 100 UNIT/ML Solostar Pen Commonly known as:  LANTUS Inject 25 Units into the skin daily at 10 pm.   Insulin Pen Needle 32G X 8 MM Misc Use as directed   lisinopril-hydrochlorothiazide 20-25 MG tablet Commonly known as:  PRINZIDE,ZESTORETIC TAKE ONE TABLET BY MOUTH DAILY   metFORMIN 500 MG tablet Commonly known as:  GLUCOPHAGE Take 1 tablet (500 mg total) by mouth 2 (two) times daily with a meal. What changed:  when to take this   methocarbamol 500 MG tablet Commonly known as:  ROBAXIN Take 1-2 tablets (500-1,000 mg total) by mouth every 6 (six) hours as needed for muscle spasms.   multivitamin tablet Take 1 tablet by mouth daily.   NICORETTE MINI 2 MG lozenge Generic drug:  nicotine polacrilex Take 2 mg by mouth as needed for smoking cessation.   Oxycodone HCl 10 MG Tabs Take 1 tablet (10 mg total) by mouth every 4 (four) hours as needed for moderate pain ((score 4 to 6)).   traZODone 100 MG tablet Commonly known as:  DESYREL TAKE ONE TABLET BY MOUTH THREE TIMES A DAY -- NEED OFFICE VISIT What changed:  See the new instructions.   vancomycin IVPB Inject 1,500 mg into the vein every 12 (twelve) hours. Indication: Septic Joint Last Day of Therapy: 08/01/17 Labs - Sunday/Monday:  CBC/D, BMP, and  vancomycin trough. Labs - Thursday:  BMP and vancomycin trough Labs - Every other week:  ESR and CRP What changed:  how much to take      Follow-up Information    Helane Rima, MD. Schedule an appointment as soon as possible for a visit in 1 week(s).   Specialty:  Family Medicine Contact information: [place] [address] [phone]        Philemon Kingdom, MD. Schedule an appointment as soon as possible for a visit.   Specialty:  Internal Medicine Why:  as needed Contact information: [address] [place] [address] [place], [facility] Follow up.   Specialty:  Home Health Services Contact information: [address] [place] [address] [phone]        Home, [facility] Follow up.   Specialty:  [facility] Contact information: [address] [place] [address] [phone]          Allergies  Allergen Reactions  . Celebrex [Celecoxib] Swelling    UNSPECIFIED AREA  . Wellbutrin [Bupropion] Rash    Consultations:   None  Other Procedures/Studies: Ct Abdomen Pelvis Wo Contrast  Result Date: 07/21/2017 CLINICAL DATA:  Abdominal pain with nausea and vomiting EXAM: CT ABDOMEN AND PELVIS WITHOUT CONTRAST TECHNIQUE: Multidetector CT imaging of the abdomen and pelvis was performed following the standard protocol without IV contrast. COMPARISON:  CT pelvis 08/14/2013 FINDINGS: Lower chest: No basilar pulmonary nodules or pleural effusion. No apical pericardial effusion. Hepatobiliary: Normal hepatic contours and density. No visible biliary dilatation. Normal gallbladder. Pancreas: Normal parenchymal contours without ductal dilatation. No peripancreatic fluid collection. Spleen: Normal. Adrenals/Urinary Tract: --Adrenal glands: Normal. --Right kidney/ureter: No hydronephrosis, perinephric stranding or nephrolithiasis. No obstructing  ureteral stones. --Left kidney/ureter: Left lower pole renal calculus measures 9 mm. --Urinary bladder: Normal appearance for the degree of distention. Stomach/Bowel: --Stomach/Duodenum: No hiatal hernia or other gastric abnormality. Normal duodenal course. --Small bowel: No dilatation or inflammation. --Colon: No focal abnormality.  Stool throughout the colon. --Appendix: Normal. Vascular/Lymphatic: Normal course and caliber of the major abdominal vessels. No abdominal or pelvic lymphadenopathy. Reproductive: Bilateral tubal ligation.  Status post hysterectomy. Musculoskeletal. L4-S1 fusion hardware. Other: None. IMPRESSION: 1. No acute abdominal or pelvic abnormality. 2. Stool throughout the entire nondilated colon. 3. 9 mm left lower pole nonobstructive renal calculus. Electronically Signed   By: Ulyses Jarred M.D.   On: 07/21/2017 04:22     TODAY-DAY OF DISCHARGE:  Subjective:   Celine Dishman today has  no headache,no chest abdominal pain,no new weakness tingling or numbness, feels much better wants to go home today.   Objective:   Blood pressure 103/63, pulse 65, temperature 98.4 F (36.9 C), temperature source Oral, resp. rate 18, height 5' (1.524 m), weight 81.2 kg (179 lb), SpO2 99 %.  Intake/Output Summary (Last 24 hours) at 07/22/2017 1114 Last data filed at 07/22/2017 0300 Gross per 24 hour  Intake 1200 ml  Output -  Net 1200 ml   Filed Weights   07/21/17 1100  Weight: 81.2 kg (179 lb)    Exam: Awake Alert, Oriented *3, No new F.N deficits, Normal affect [place].AT,PERRAL Supple Neck,No JVD, No cervical lymphadenopathy appriciated.  Symmetrical Chest wall movement, Good air movement bilaterally, CTAB RRR,No Gallops,Rubs or new Murmurs, No Parasternal Heave +ve B.Sounds, Abd Soft, Non tender, No organomegaly appriciated, No rebound -guarding or rigidity. No Cyanosis, Clubbing or edema, No new Rash or bruise   PERTINENT RADIOLOGIC STUDIES: Ct Abdomen Pelvis Wo Contrast  Result  Date: 07/21/2017 CLINICAL DATA:  Abdominal pain with nausea and vomiting EXAM: CT ABDOMEN AND PELVIS WITHOUT CONTRAST TECHNIQUE: Multidetector CT imaging of the abdomen and pelvis was performed following the standard protocol without IV contrast. COMPARISON:  CT pelvis 08/14/2013 FINDINGS: Lower chest: No basilar pulmonary nodules or pleural effusion. No apical pericardial effusion. Hepatobiliary: Normal hepatic contours and density. No visible biliary dilatation. Normal gallbladder. Pancreas: Normal parenchymal contours without ductal dilatation. No peripancreatic fluid collection. Spleen: Normal. Adrenals/Urinary Tract: --Adrenal glands: Normal. --Right kidney/ureter: No hydronephrosis, perinephric stranding or nephrolithiasis. No obstructing ureteral stones. --Left kidney/ureter: Left lower pole renal calculus measures 9 mm. --Urinary bladder: Normal appearance for the degree of distention. Stomach/Bowel: --Stomach/Duodenum: No hiatal hernia or other gastric abnormality. Normal duodenal course. --Small bowel: No dilatation or inflammation. --Colon: No focal abnormality.  Stool throughout the colon. --Appendix: Normal. Vascular/Lymphatic: Normal course and caliber of the major abdominal vessels. No abdominal or pelvic lymphadenopathy. Reproductive: Bilateral tubal ligation.  Status post hysterectomy. Musculoskeletal. L4-S1 fusion hardware. Other: None. IMPRESSION: 1. No acute abdominal or pelvic abnormality. 2. Stool throughout the entire nondilated colon. 3. 9 mm left lower pole nonobstructive renal calculus. Electronically Signed   By: Ulyses Jarred M.D.   On: 07/21/2017 04:22     PERTINENT LAB RESULTS: CBC: Recent Labs    07/20/17 1637 07/20/17 2345  WBC 12.1* 8.6  HGB 14.1 11.4*  HCT 41.2 33.7*  PLT 297 257   CMET CMP     Component Value Date/Time   NA 135 07/21/2017 0939   NA 139 08/26/2016 1612   K 3.3 (L) 07/21/2017 0939   CL 100 (L) 07/21/2017 0939   CO2 21 (L) 07/21/2017 0939    GLUCOSE 185 (H) 07/21/2017 0939   BUN 14 07/21/2017 0939   BUN 27 (H) 08/26/2016 1612   CREATININE 0.68 07/22/2017 0621   CREATININE 0.81 03/10/2016 1410   CALCIUM 9.0 07/21/2017 0939   PROT 7.8 07/20/2017 1632   PROT 7.2 08/26/2016 1612   ALBUMIN 4.5 07/20/2017 1632   ALBUMIN 4.9 08/26/2016 1612   AST 18 07/20/2017 1632   ALT 5 (L) 07/20/2017 1632   ALKPHOS 103 07/20/2017 1632   BILITOT 1.4 (H) 07/20/2017 1632   BILITOT <0.2 08/26/2016 1612   GFRNONAA >60 07/22/2017 0621   GFRNONAA 84 03/10/2016 1410   GFRAA >60 07/22/2017 0621   GFRAA >89 03/10/2016 1410    GFR Estimated Creatinine Clearance: 77.7 mL/min (by C-G formula based on SCr of 0.68 mg/dL). Recent Labs  07/20/17 1632  LIPASE 87*   Recent Labs    07/21/17 0144 07/21/17 0605 07/21/17 1325  TROPONINI <0.03 <0.03 <0.03   Invalid input(s): POCBNP No results for input(s): DDIMER in the last 72 hours. Recent Labs    07/21/17 0939  HGBA1C 9.2*   No results for input(s): CHOL, HDL, LDLCALC, TRIG, CHOLHDL, LDLDIRECT in the last 72 hours. No results for input(s): TSH, T4TOTAL, T3FREE, THYROIDAB in the last 72 hours.  Invalid input(s): FREET3 No results for input(s): VITAMINB12, FOLATE, FERRITIN, TIBC, IRON, RETICCTPCT in the last 72 hours. Coags: No results for input(s): INR in the last 72 hours.  Invalid input(s): PT Microbiology: No results found for this or any previous visit (from the past 240 hour(s)).  FURTHER DISCHARGE INSTRUCTIONS:  Get Medicines reviewed and adjusted: Please take all your medications with you for your next visit with your Primary MD  Laboratory/radiological data: Please request your Primary MD to go over all hospital tests and procedure/radiological results at the follow up, please ask your Primary MD to get all Hospital records sent to his/her office.  In some cases, they will be blood work, cultures and biopsy results pending at the time of your discharge. Please request that  your primary care M.D. goes through all the records of your hospital data and follows up on these results.  Also Note the following: If you experience worsening of your admission symptoms, develop shortness of breath, life threatening emergency, suicidal or homicidal thoughts you must seek medical attention immediately by calling 911 or calling your MD immediately  if symptoms less severe.  You must read complete instructions/literature along with all the possible adverse reactions/side effects for all the Medicines you take and that have been prescribed to you. Take any new Medicines after you have completely understood and accpet all the possible adverse reactions/side effects.   Do not drive when taking Pain medications or sleeping medications (Benzodaizepines)  Do not take more than prescribed Pain, Sleep and Anxiety Medications. It is not advisable to combine anxiety,sleep and pain medications without talking with your primary care practitioner  Special Instructions: If you have smoked or chewed Tobacco  in the last 2 yrs please stop smoking, stop any regular Alcohol  and or any Recreational drug use.  Wear Seat belts while driving.  Please note: You were cared for by a hospitalist during your hospital stay. Once you are discharged, your primary care physician will handle any further medical issues. Please note that NO REFILLS for any discharge medications will be authorized once you are discharged, as it is imperative that you return to your primary care physician (or establish a relationship with a primary care physician if you do not have one) for your post hospital discharge needs so that they can reassess your need for medications and monitor your lab values.  Total Time spent coordinating discharge including counseling, education and face to face time equals 35 minutes.  SignedOren Binet 07/22/2017 11:14 AM

## 2017-07-22 NOTE — Progress Notes (Signed)
[facility]  Active pt with [facility] for home Infusion Pharmacy services for home IV ABX in partnership with [facility] who is providing [facility] services. Connected with Rosalie Doctor, RN with [facility] to coordinate DC to home later today.  If patient discharges after hours, please call [phone].   Sedalia Muta 07/22/2017, 9:43 AM

## 2017-07-22 NOTE — Progress Notes (Signed)
Pt is active with [facility] for home health services and uses [facility] for home pharmacy for IV abx. Both [facility] and [facility] alerted of discharge today and MD orders received for resumption of care at home. Sandford Craze RN,BSN,NCM [phone]

## 2017-07-22 NOTE — Progress Notes (Addendum)
Inpatient Diabetes Program Recommendations  AACE/ADA: New Consensus Statement on Inpatient Glycemic Control (2015)  Target Ranges:  Prepandial:   less than 140 mg/dL      Peak postprandial:   less than 180 mg/dL (1-2 hours)      Critically ill patients:  140 - 180 mg/dL   Lab Results  Component Value Date   GLUCAP 245 (H) 07/22/2017   HGBA1C 9.2 (H) 07/21/2017    Review of Glycemic Control  Reviewed insulin pen administration. Pt was able to demonstrate same. Discussed hypoglycemia s/s and treatment. Pt states she feels comfortable in going home on insulin with insulin pen. Discussed basal and bolus insulin (2 pens). Pt states she will call to schedule PCP appt for diabetes management after hospitalization. Instructed to check blood sugars 3-4x/day and take logbook to MD appt. Questions answered.  Discharge meds:  Lantus 25 units QD Novolog 8 units tidwc Metformin 500 mg bid  Thank you. Ailene Ards, RD, LDN, CDE Inpatient Diabetes Coordinator [phone]

## 2017-07-26 ENCOUNTER — Encounter: Payer: Self-pay | Admitting: Infectious Disease

## 2017-07-26 ENCOUNTER — Ambulatory Visit (INDEPENDENT_AMBULATORY_CARE_PROVIDER_SITE_OTHER): Payer: Commercial Managed Care - PPO | Admitting: Infectious Disease

## 2017-07-26 ENCOUNTER — Telehealth: Payer: Self-pay | Admitting: *Deleted

## 2017-07-26 VITALS — BP 106/76 | HR 114 | Temp 98.0°F | Wt 180.0 lb

## 2017-07-26 DIAGNOSIS — E1165 Type 2 diabetes mellitus with hyperglycemia: Secondary | ICD-10-CM | POA: Insufficient documentation

## 2017-07-26 DIAGNOSIS — T8450XD Infection and inflammatory reaction due to unspecified internal joint prosthesis, subsequent encounter: Secondary | ICD-10-CM | POA: Diagnosis not present

## 2017-07-26 DIAGNOSIS — Z96651 Presence of right artificial knee joint: Secondary | ICD-10-CM | POA: Diagnosis not present

## 2017-07-26 HISTORY — DX: Type 2 diabetes mellitus with hyperglycemia: E11.65

## 2017-07-26 NOTE — Telephone Encounter (Signed)
OK very good thanks Marcelino Duster!

## 2017-07-26 NOTE — Telephone Encounter (Signed)
Tiffany, RN at [facility], called to let Dr Daiva Eves know that patient's PICC line will not draw back blood for labs, but antibiotics are flowing well. She needed verbal order for peripheral stick for labs drawn on 3/18. Andree Coss, RN

## 2017-07-26 NOTE — Progress Notes (Signed)
Subjective:   Chief complaint: knee pain   Patient ID: Brittney SCARBERRY, female    DOB: [birth year], 52 y.o.   MRN: [id number]  HPI  53 year old with poorly controlled DM, and recurrent septic prosthetic right knee arthroplasty. She had TKA in 2014 and had treatment in [place] with single staged polyexchange with daptomycin and ceftriaxone x 6 weeks and bactrim/cipro to complete 6 months. Patient had been doing well until about 2 weeks prior to admission when she noticed increased joint pain and swelling; she was seen in ED and had joint aspiration of cloudy fluid - analysis showed 115K WBC, neutrophil predominant; no growth. At the ED visit, she received Vanc and Zosyn (after aspiration) and was sent home with augmentin and doxycycline. At f/u with ortho, she had worsening pain and swelling so she was admitted. She has undergone excision arthroplasty with placement of antibiotic spacer. The intra-operative cultures unsurprisingly also did not yield an organism. We placed her on daptomycin and ceftriaxone initially but cost became an issue so she was changed to vancomycin and ceftriaxone. She is nearly 6 weeks of IV therapy being completed next week. Knee pain is dramatically better. She was having pain and swelling constantly before but now when she bears weight for periods of time.  ESR from when she was hospitalized has hardly budged having been 33 and now 50. CRP has come down from nearly 30 to 12.   She is scheduled for implantation of new prosthesis with Dr. Ronnie Derby in April.  Past Medical History:  Diagnosis Date  . Anxiety   . Arthritis    "all my joints" (06/20/2017)  . Depression   . Diabetes mellitus type 2, controlled ([place]) 05/20/2014  . GERD (gastroesophageal reflux disease)    "gone w/diet revision" (06/20/2017)  . Hyperlipidemia   . Hypertension   . Neuropathy    post back surgery  . Pneumonia X 1  . Right knee DJD 01/27/2013  . Substance abuse ([place])    Alcohol - sober since 1996      Past Surgical History:  Procedure Laterality Date  . AMPUTATION Right 10/03/2015   Procedure: Right Foot 3rd Ray Amputation;  Surgeon: Newt Minion, MD;  Location: [facility];  Service: Orthopedics;  Laterality: Right;  . BACK SURGERY    . BUNIONECTOMY WITH HAMMERTOE RECONSTRUCTION Bilateral   . EXCISIONAL TOTAL KNEE ARTHROPLASTY WITH ANTIBIOTIC SPACERS Right 06/20/2017  . EXCISIONAL TOTAL KNEE ARTHROPLASTY WITH ANTIBIOTIC SPACERS Right 06/20/2017   Procedure: EXCISION RIGHT TOTAL KNEE ARTHROPLASTY WITH ANTIBIOTIC SPACERS;  Surgeon: Vickey Huger, MD;  Location: [facility];  Service: Orthopedics;  Laterality: Right;  . JOINT REPLACEMENT    . POSTERIOR LUMBAR FUSION  11/2007   L4, L5   . POSTERIOR LUMBAR FUSION  01/2008     second surgery L2-L5 and S1  . SHOULDER ARTHROSCOPY W/ ROTATOR CUFF REPAIR Left 01/2017  . TOTAL KNEE ARTHROPLASTY Right 01/26/2013   Procedure: TOTAL KNEE ARTHROPLASTY;  Surgeon: Yvette Rack., MD;  Location: [facility];  Service: Orthopedics;  Laterality: Right;  . TUBAL LIGATION    . VAGINAL HYSTERECTOMY      Family History  Problem Relation Age of Onset  . Thyroid disease Mother   . Hypertension Father   . Anxiety disorder Sister   . Cancer Paternal Grandmother        prostate      Social History   Socioeconomic History  . Marital status: Married    Spouse name:  None  . Number of children: None  . Years of education: None  . Highest education level: None  Social Needs  . Financial resource strain: None  . Food insecurity - worry: None  . Food insecurity - inability: None  . Transportation needs - medical: None  . Transportation needs - non-medical: None  Occupational History  . None  Tobacco Use  . Smoking status: Current Every Day Smoker    Packs/day: 0.50    Years: 15.00    Pack years: 7.50    Types: Cigarettes  . Smokeless tobacco: Never Used  Substance and Sexual Activity  . Alcohol use: No    Alcohol/week: 0.0 oz    Comment: Pt is a  recovering alcoholic and has been sober since ~2006.  . Drug use: Yes    Types: Marijuana, Oxycodone, Hydrocodone    Comment: 2/11//2019 "nothing in the last year"  . Sexual activity: Yes    Birth control/protection: Surgical  Other Topics Concern  . None  Social History Narrative  . None    Allergies  Allergen Reactions  . Celebrex [Celecoxib] Swelling    UNSPECIFIED AREA  . Wellbutrin [Bupropion] Rash     Current Outpatient Medications:  .  acetaminophen (TYLENOL) 500 MG tablet, Take 2 tablets (1,000 mg total) by mouth every 8 (eight) hours as needed., Disp: 30 tablet, Rfl: 0 .  acyclovir (ZOVIRAX) 200 MG capsule, TAKE TWO CAPSULES BY MOUTH THREE TIMES A DAY FOR 5 DAYS AS NEEDED FOR OUTBREAK, Disp: 60 capsule, Rfl: 0 .  aspirin EC 325 MG EC tablet, Take 1 tablet (325 mg total) by mouth 2 (two) times daily., Disp: 30 tablet, Rfl: 0 .  aspirin EC 81 MG tablet, Take 81 mg by mouth daily., Disp: , Rfl:  .  cefTRIAXone (ROCEPHIN) IVPB, Inject 2 g into the vein daily. Indication: Septic Joint Last Day of Therapy:  3/25 Labs - Once weekly:  CBC/D and BMP, Labs - Every other week:  ESR and CRP, Disp: 41 Units, Rfl: 0 .  DULoxetine (CYMBALTA) 60 MG capsule, TAKE ONE CAPSULE BY MOUTH TWICE A DAY **MUST CALL MD FOR APPOINTMENT, Disp: 60 capsule, Rfl: 0 .  gabapentin (NEURONTIN) 800 MG tablet, Take 1 tablet (800 mg total) by mouth 4 (four) times daily. Office visit needed, Disp: 60 tablet, Rfl: 0 .  insulin aspart (NOVOLOG FLEXPEN) 100 UNIT/ML FlexPen, Inject 8 Units into the skin 3 (three) times daily with meals., Disp: 15 mL, Rfl: 0 .  Insulin Glargine (LANTUS) 100 UNIT/ML Solostar Pen, Inject 25 Units into the skin daily at 10 pm., Disp: 15 mL, Rfl: 0 .  Insulin Pen Needle 32G X 8 MM MISC, Use as directed, Disp: 100 each, Rfl: 0 .  lisinopril-hydrochlorothiazide (PRINZIDE,ZESTORETIC) 20-25 MG tablet, TAKE ONE TABLET BY MOUTH DAILY, Disp: 90 tablet, Rfl: 3 .  metFORMIN (GLUCOPHAGE) 500 MG  tablet, Take 1 tablet (500 mg total) by mouth 2 (two) times daily with a meal., Disp: 90 tablet, Rfl: 0 .  methocarbamol (ROBAXIN) 500 MG tablet, Take 1-2 tablets (500-1,000 mg total) by mouth every 6 (six) hours as needed for muscle spasms., Disp: 60 tablet, Rfl: 0 .  Multiple Vitamin (MULTIVITAMIN) tablet, Take 1 tablet by mouth daily., Disp: , Rfl:  .  nicotine polacrilex (NICORETTE MINI) 2 MG lozenge, Take 2 mg by mouth as needed for smoking cessation., Disp: , Rfl:  .  oxyCODONE 10 MG TABS, Take 1 tablet (10 mg total) by mouth every 4 (four) hours  as needed for moderate pain ((score 4 to 6))., Disp: 40 tablet, Rfl: 0 .  traZODone (DESYREL) 100 MG tablet, TAKE ONE TABLET BY MOUTH THREE TIMES A DAY -- NEED OFFICE VISIT (Patient taking differently: TAKE 200MG-327m BY MOUTH AT BEDTIME), Disp: 90 tablet, Rfl: 0 .  vancomycin IVPB, Inject 1,500 mg into the vein every 12 (twelve) hours. Indication: Septic Joint Last Day of Therapy: 08/01/17 Labs - Sunday/Monday:  CBC/D, BMP, and vancomycin trough. Labs - Thursday:  BMP and vancomycin trough Labs - Every other week:  ESR and CRP, Disp: 82 Units, Rfl: 0 .  B Complex-C (B-COMPLEX WITH VITAMIN C) tablet, Take 1 tablet by mouth daily., Disp: , Rfl:      Review of Systems  Constitutional: Negative for activity change, appetite change, chills, diaphoresis, fatigue, fever and unexpected weight change.  HENT: Negative for congestion, rhinorrhea, sinus pressure, sneezing, sore throat and trouble swallowing.   Eyes: Negative for photophobia and visual disturbance.  Respiratory: Negative for cough, chest tightness, shortness of breath, wheezing and stridor.   Cardiovascular: Negative for chest pain, palpitations and leg swelling.  Gastrointestinal: Negative for abdominal distention, abdominal pain, anal bleeding, blood in stool, constipation, diarrhea, nausea and vomiting.  Genitourinary: Negative for difficulty urinating, dysuria, flank pain and hematuria.   Musculoskeletal: Positive for arthralgias. Negative for back pain, gait problem, joint swelling and myalgias.  Skin: Positive for wound. Negative for color change, pallor and rash.  Neurological: Negative for dizziness, tremors, weakness and light-headedness.  Hematological: Negative for adenopathy. Does not bruise/bleed easily.  Psychiatric/Behavioral: Negative for agitation, behavioral problems, confusion, decreased concentration, dysphoric mood and sleep disturbance.       Objective:   Physical Exam  Constitutional: She is oriented to person, place, and time. She appears well-developed and well-nourished. No distress.  HENT:  Head: Normocephalic and atraumatic.  Mouth/Throat: No oropharyngeal exudate.  Eyes: Conjunctivae and EOM are normal. No scleral icterus.  Neck: Normal range of motion. Neck supple.  Cardiovascular: Normal rate and regular rhythm.  Pulmonary/Chest: Effort normal. No respiratory distress. She has no wheezes.  Abdominal: She exhibits no distension.  Musculoskeletal: She exhibits no edema or tenderness.  Neurological: She is alert and oriented to person, place, and time. She exhibits normal muscle tone. Coordination normal.  Skin: Skin is warm and dry. No rash noted. She is not diaphoretic. No erythema. No pallor.  Psychiatric: She has a normal mood and affect. Her behavior is normal. Judgment and thought content normal.    PICC is clean  07/26/17:      Right knee incision healing well.  07/26/17:          Assessment & Plan:   Prosthetic joint infection: her symptoms are improving and her exam is re-assuring. Her CRP has come down by 50% but ESR has hardly budged  I would recommend repeat check of ESR, CRP one month after completing IV antibiotics and alson  ONE month after finishing IV abx performing  Aspiration of the joint space for fluid under sterile technique with fluid sent for :  Cell count and differential  Culture  If the  synovial fluid analysis and culture is reassuring along with inflammatory markers and exam then I would proceed to replace the joint  If NOT however I would not proceed with reimplantation yet.  Poorly controlled DM: has been very labile it appears. She will followup with PCP  I spent greater than 25 minutes with the patient including greater than 50% of time in face  to face counsel of the patient and her husband about nature of prosthetic joint infections and how we manage them and monitor success in treatment and in coordination of her care.

## 2017-07-26 NOTE — Telephone Encounter (Signed)
Ok, I just saw pt and she is about to be done next week with IV antibiotics. How do we give her a verbal order

## 2017-07-26 NOTE — Telephone Encounter (Signed)
I did

## 2017-07-28 ENCOUNTER — Encounter: Payer: Self-pay | Admitting: Infectious Disease

## 2017-07-29 ENCOUNTER — Other Ambulatory Visit: Payer: Self-pay | Admitting: Pharmacist

## 2017-08-01 ENCOUNTER — Encounter: Payer: Self-pay | Admitting: Infectious Disease

## 2017-08-03 ENCOUNTER — Telehealth: Payer: Self-pay | Admitting: *Deleted

## 2017-08-03 NOTE — Telephone Encounter (Signed)
Per Dr Daiva Eves called [facility] and gave the verbal order to D/C the patient PICC.  Patient is also aware of the order and waiting for a call from [facility].

## 2017-08-03 NOTE — Telephone Encounter (Signed)
Patient called to report that she got her last dose of IV medication yesterday 08/02/17 and wants to know if the PICC is to be removed prior to her surgery 08/22/17. She advised you can not get blood work from the line and she does have flushes but wants to know what to do. Advised not sure as no note to D/C so will have to ask the doctor and give her a call back.

## 2017-08-03 NOTE — Telephone Encounter (Signed)
The PICC line needs to be removed

## 2017-08-04 ENCOUNTER — Encounter: Payer: Self-pay | Admitting: Infectious Disease

## 2017-08-05 ENCOUNTER — Other Ambulatory Visit: Payer: Self-pay | Admitting: Pharmacist

## 2017-08-08 ENCOUNTER — Telehealth: Payer: Self-pay | Admitting: *Deleted

## 2017-08-08 NOTE — Telephone Encounter (Signed)
I do not know the length of the PICC line. It may be in the PICC line RN note

## 2017-08-08 NOTE — Telephone Encounter (Signed)
If it is time that is fine with me that it come sout

## 2017-08-08 NOTE — Telephone Encounter (Signed)
Mary, Facilities manager, called to confirm patient's PICC length before they pull it today. Per records, length is 39 cm. Andree Coss, RN

## 2017-08-08 NOTE — Telephone Encounter (Signed)
It is in the hospitalization summary, 39 cm. Patient adamant that her PICC come out today.

## 2017-08-10 ENCOUNTER — Other Ambulatory Visit: Payer: Self-pay | Admitting: Physician Assistant

## 2017-08-10 DIAGNOSIS — G894 Chronic pain syndrome: Secondary | ICD-10-CM

## 2017-08-10 NOTE — Telephone Encounter (Signed)
Attempted to contact pt; office visit needed for additional refills of cymbalta; left message on voic mail [phone].

## 2017-08-10 NOTE — Pre-Procedure Instructions (Signed)
Brittney Sawyer  08/10/2017      [facility] [address] [place] - [address] [place] [address] Phone: [phone] Fax: [phone]    Your procedure is scheduled on Monday April 15.  Report to [facility] Admitting at 8:00 A.M.  Call this number if you have problems the morning of surgery:  [phone]   Remember:  Do not eat food or drink liquids after midnight.  Take these medicines the morning of surgery with A SIP OF WATER:   Duloxetine (cymbalta) Gabapentin (Neurontin)  DO NOT take Metformin (Glucophage) the day of surgery  TAKE HALF dose of insulin Glargine (Lantus) the night before surgery (12 units)  If your CBG is greater than 220 mg/dL, you may take  of your sliding scale (correction) dose of insulin.  7 days prior to surgery STOP taking any Aleve, Naproxen, Ibuprofen, Motrin, Advil, Goody's, BC's, all herbal medications, fish oil, and all vitamins  **Follow your surgeon's instructions on stopping Aspirin. If no instructions were given, please call your surgeon's office**    How to Manage Your Diabetes Before and After Surgery  Why is it important to control my blood sugar before and after surgery? . Improving blood sugar levels before and after surgery helps healing and can limit problems. . A way of improving blood sugar control is eating a healthy diet by: o  Eating less sugar and carbohydrates o  Increasing activity/exercise o  Talking with your doctor about reaching your blood sugar goals . High blood sugars (greater than 180 mg/dL) can raise your risk of infections and slow your recovery, so you will need to focus on controlling your diabetes during the weeks before surgery. . Make sure that the doctor who takes care of your diabetes knows about your planned surgery including the date and location.  How do I manage my blood sugar before surgery? . Check your blood sugar at least 4 times  a day, starting 2 days before surgery, to make sure that the level is not too high or low. o Check your blood sugar the morning of your surgery when you wake up and every 2 hours until you get to the Short Stay unit. . If your blood sugar is less than 70 mg/dL, you will need to treat for low blood sugar: o Do not take insulin. o Treat a low blood sugar (less than 70 mg/dL) with  cup of clear juice (cranberry or apple), 4 glucose tablets, OR glucose gel. Recheck blood sugar in 15 minutes after treatment (to make sure it is greater than 70 mg/dL). If your blood sugar is not greater than 70 mg/dL on recheck, call [phone] o  for further instructions. . Report your blood sugar to the short stay nurse when you get to Short Stay.  . If you are admitted to the hospital after surgery: o Your blood sugar will be checked by the staff and you will probably be given insulin after surgery (instead of oral diabetes medicines) to make sure you have good blood sugar levels. o The goal for blood sugar control after surgery is 80-180 mg/dL.                Do not wear jewelry, make-up or nail polish.  Do not wear lotions, powders, or perfumes, or deodorant.  Do not shave 48 hours prior to surgery.  Men may shave face and neck.  Do not bring valuables  to the hospital.  [facility] is not responsible for any belongings or valuables.  Contacts, dentures or bridgework may not be worn into surgery.  Leave your suitcase in the car.  After surgery it may be brought to your room.  For patients admitted to the hospital, discharge time will be determined by your treatment team.  Patients discharged the day of surgery will not be allowed to drive home.   Special instructions:    [facility]- Preparing For Surgery  Before surgery, you can play an important role. Because skin is not sterile, your skin needs to be as free of germs as possible. You can reduce the number of germs on your skin by washing  with CHG (chlorahexidine gluconate) Soap before surgery.  CHG is an antiseptic cleaner which kills germs and bonds with the skin to continue killing germs even after washing.  Please do not use if you have an allergy to CHG or antibacterial soaps. If your skin becomes reddened/irritated stop using the CHG.  Do not shave (including legs and underarms) for at least 48 hours prior to first CHG shower. It is OK to shave your face.  Please follow these instructions carefully.   1. Shower the NIGHT BEFORE SURGERY and the MORNING OF SURGERY with CHG.   2. If you chose to wash your hair, wash your hair first as usual with your normal shampoo.  3. After you shampoo, rinse your hair and body thoroughly to remove the shampoo.  4. Use CHG as you would any other liquid soap. You can apply CHG directly to the skin and wash gently with a scrungie or a clean washcloth.   5. Apply the CHG Soap to your body ONLY FROM THE NECK DOWN.  Do not use on open wounds or open sores. Avoid contact with your eyes, ears, mouth and genitals (private parts). Wash Face and genitals (private parts)  with your normal soap.  6. Wash thoroughly, paying special attention to the area where your surgery will be performed.  7. Thoroughly rinse your body with warm water from the neck down.  8. DO NOT shower/wash with your normal soap after using and rinsing off the CHG Soap.  9. Pat yourself dry with a CLEAN TOWEL.  10. Wear CLEAN PAJAMAS to bed the night before surgery, wear comfortable clothes the morning of surgery  11. Place CLEAN SHEETS on your bed the night of your first shower and DO NOT SLEEP WITH PETS.    Day of Surgery: Do not apply any deodorants/lotions. Please wear clean clothes to the hospital/surgery center.      Please read over the following fact sheets that you were given. Coughing and Deep Breathing, MRSA Information and Surgical Site Infection Prevention

## 2017-08-11 ENCOUNTER — Inpatient Hospital Stay (HOSPITAL_COMMUNITY)
Admission: RE | Admit: 2017-08-11 | Discharge: 2017-08-11 | Disposition: A | Discharge status: Home / Self Care | Payer: Commercial Managed Care - PPO | Source: Ambulatory Visit

## 2017-08-11 ENCOUNTER — Other Ambulatory Visit: Payer: Self-pay | Admitting: Physician Assistant

## 2017-08-11 DIAGNOSIS — G894 Chronic pain syndrome: Secondary | ICD-10-CM

## 2017-08-11 NOTE — Telephone Encounter (Signed)
Called pt to secure appt; pt states disregard.

## 2017-08-11 NOTE — Progress Notes (Signed)
Not here for 1500 Pat appointment called and Brittney Sawyer states that she already called to let us know she could not make her appointment today. Our scheduler called and informed.

## 2017-08-16 ENCOUNTER — Other Ambulatory Visit: Payer: Self-pay

## 2017-08-16 ENCOUNTER — Encounter (HOSPITAL_COMMUNITY)
Admission: RE | Admit: 2017-08-16 | Discharge: 2017-08-16 | Disposition: A | Discharge status: Home / Self Care | Payer: Commercial Managed Care - PPO | Source: Ambulatory Visit | Attending: Orthopedic Surgery | Admitting: Orthopedic Surgery

## 2017-08-16 ENCOUNTER — Encounter (HOSPITAL_COMMUNITY): Payer: Self-pay

## 2017-08-16 DIAGNOSIS — E119 Type 2 diabetes mellitus without complications: Secondary | ICD-10-CM | POA: Insufficient documentation

## 2017-08-16 DIAGNOSIS — Z90710 Acquired absence of both cervix and uterus: Secondary | ICD-10-CM | POA: Insufficient documentation

## 2017-08-16 DIAGNOSIS — T8453XA Infection and inflammatory reaction due to internal right knee prosthesis, initial encounter: Secondary | ICD-10-CM | POA: Insufficient documentation

## 2017-08-16 DIAGNOSIS — Z01812 Encounter for preprocedural laboratory examination: Secondary | ICD-10-CM | POA: Diagnosis not present

## 2017-08-16 DIAGNOSIS — Z89431 Acquired absence of right foot: Secondary | ICD-10-CM | POA: Insufficient documentation

## 2017-08-16 DIAGNOSIS — Z794 Long term (current) use of insulin: Secondary | ICD-10-CM | POA: Diagnosis not present

## 2017-08-16 DIAGNOSIS — K219 Gastro-esophageal reflux disease without esophagitis: Secondary | ICD-10-CM | POA: Diagnosis not present

## 2017-08-16 DIAGNOSIS — Z79899 Other long term (current) drug therapy: Secondary | ICD-10-CM | POA: Diagnosis not present

## 2017-08-16 DIAGNOSIS — Z981 Arthrodesis status: Secondary | ICD-10-CM | POA: Insufficient documentation

## 2017-08-16 DIAGNOSIS — E785 Hyperlipidemia, unspecified: Secondary | ICD-10-CM | POA: Insufficient documentation

## 2017-08-16 DIAGNOSIS — Z7982 Long term (current) use of aspirin: Secondary | ICD-10-CM | POA: Diagnosis not present

## 2017-08-16 DIAGNOSIS — I10 Essential (primary) hypertension: Secondary | ICD-10-CM | POA: Diagnosis not present

## 2017-08-16 DIAGNOSIS — Z0183 Encounter for blood typing: Secondary | ICD-10-CM | POA: Diagnosis not present

## 2017-08-16 DIAGNOSIS — Y838 Other surgical procedures as the cause of abnormal reaction of the patient, or of later complication, without mention of misadventure at the time of the procedure: Secondary | ICD-10-CM | POA: Insufficient documentation

## 2017-08-16 DIAGNOSIS — F329 Major depressive disorder, single episode, unspecified: Secondary | ICD-10-CM | POA: Diagnosis not present

## 2017-08-16 DIAGNOSIS — F419 Anxiety disorder, unspecified: Secondary | ICD-10-CM | POA: Diagnosis not present

## 2017-08-16 HISTORY — DX: Other reaction to spinal and lumbar puncture: G97.1

## 2017-08-16 HISTORY — DX: Unspecified infectious disease: B99.9

## 2017-08-16 LAB — COMPREHENSIVE METABOLIC PANEL
ALBUMIN: 3.9 g/dL (ref 3.5–5.0)
ALK PHOS: 77 U/L (ref 38–126)
ALT: 19 U/L (ref 14–54)
AST: 20 U/L (ref 15–41)
Anion gap: 13 (ref 5–15)
BUN: 21 mg/dL — ABNORMAL HIGH (ref 6–20)
CALCIUM: 9.4 mg/dL (ref 8.9–10.3)
CO2: 23 mmol/L (ref 22–32)
CREATININE: 1.01 mg/dL — AB (ref 0.44–1.00)
Chloride: 101 mmol/L (ref 101–111)
GFR calc Af Amer: >60 mL/min (ref >60)
GFR calc non Af Amer: >60 mL/min (ref >60)
GLUCOSE: 157 mg/dL — AB (ref 65–99)
Potassium: 4.2 mmol/L (ref 3.5–5.1)
SODIUM: 137 mmol/L (ref 135–145)
Total Bilirubin: 0.5 mg/dL (ref 0.3–1.2)
Total Protein: 6.9 g/dL (ref 6.5–8.1)

## 2017-08-16 LAB — CBC WITH DIFFERENTIAL/PLATELET
BASOS PCT: 0 %
Basophils Absolute: 0 10*3/uL (ref 0.0–0.1)
EOS ABS: 0.2 10*3/uL (ref 0.0–0.7)
Eosinophils Relative: 2 %
HCT: 38 % (ref 36.0–46.0)
Hemoglobin: 12.2 g/dL (ref 12.0–15.0)
LYMPHS ABS: 1.8 10*3/uL (ref 0.7–4.0)
Lymphocytes Relative: 17 %
MCH: 31.1 pg (ref 26.0–34.0)
MCHC: 32.1 g/dL (ref 30.0–36.0)
MCV: 96.9 fL (ref 78.0–100.0)
Monocytes Absolute: 0.5 10*3/uL (ref 0.1–1.0)
Monocytes Relative: 5 %
Neutro Abs: 7.8 10*3/uL — ABNORMAL HIGH (ref 1.7–7.7)
Neutrophils Relative %: 76 %
Platelets: 311 10*3/uL (ref 150–400)
RBC: 3.92 MIL/uL (ref 3.87–5.11)
RDW: 13 % (ref 11.5–15.5)
WBC: 10.3 10*3/uL (ref 4.0–10.5)

## 2017-08-16 LAB — TYPE AND SCREEN
ABO/RH(D): O POS
Antibody Screen: NEGATIVE

## 2017-08-16 LAB — SURGICAL PCR SCREEN
MRSA, PCR: NEGATIVE
STAPHYLOCOCCUS AUREUS: NEGATIVE

## 2017-08-16 LAB — GLUCOSE, CAPILLARY: Glucose-Capillary: 165 mg/dL — ABNORMAL HIGH (ref 65–99)

## 2017-08-16 NOTE — Progress Notes (Signed)
Pt denies SOB, chest pain, and being under the care of a cardiologist. Pt denies having a stress test, echo and cardiac cath. Pt denies having a chest x ray within the last year. Pt denies recent labs. Pt chart forwarded to anesthesia for review of abnormal EKG and labs.

## 2017-08-17 ENCOUNTER — Ambulatory Visit: Payer: Commercial Managed Care - PPO | Admitting: Skilled Nursing Facility1

## 2017-08-17 ENCOUNTER — Encounter: Payer: Self-pay | Admitting: Physician Assistant

## 2017-08-17 NOTE — Progress Notes (Signed)
Anesthesia chart review: Patient is a 52 year old female scheduled for right knee antibiotic spacer removal, total knee revision on 08/22/2017 by Dr. Dannielle Huh. She is s/p excision right TKA with antibiotic spacers on 06/22/17. She has completed antibiotic therapy with Rocephin and Vancomycin.  PMH includes smoking, HTN, DM2, hyperlipidemia, GERD, anxiety, depression, spinal headache, alcohol abuse (sober since '96), right TKA 01/26/13 (complicated by infection; s/p excision right TKA/antibiotic spacer 06/22/17, right foot 3rd ray amputattion 10/03/15, L5-S1 ALIF 12/06/07, L4-S1 PLIF 01/10/08, hysterectomy 12/18/04, neuropathy. BMI is consistent with obesity.    - Hospitalization 07/20/17-07/22/17 for DKA in the setting of on-going treatment for right knee septic arthritis. Patient had only been on metformin at that point and was started on insulin.  PCP is listed as Dr. Devra Dopp. Patient was seen on 08/11/17 by Azzie Roup, FNP for DM follow-up. DM medication regimen adjusted.   Medications include aspirin 81 mg, Cymbalta, Neurontin, NovoLog, Lantus, lisinopril-HCTZ, metformin, Robaxin, nicotine transdermal, trazodone, Chantix.  BP 122/75   Pulse 81   Temp 36.9 C   Resp 20   Ht 5' (1.524 m)   Wt 182 lb (82.6 kg)   SpO2 95%   BMI 35.54 kg/m   EKG 07/21/17: Sinus rhythm, first-degree AV block, anterior septal infarct, age undetermined.  Overall tracing appears stable when compared to prior.  CT chest 09/06/16: Impression: 1. Fat density lesion in the left upper lobe is unchanged from 06/08/2016 and consistent with a benign hamartoma. 2. Heterogeneous thyroid nodule, as before. Consider further evaluation with thyroid ultrasound. If patient is clinically hyperthyroid, consider nuclear medicine thyroid uptake and scan. 3. Bilateral adrenal adenomas.  Preoperative labs noted. BUN 21, Cr 1.01. Glucose 157. WBC 10.3, H/H 12.2/38.0. PLT 311. A1c 07/21/17 was 9.2 (during admission for DKA, started  on insulin at that time. Previous A1c had been 6.3 on 06/17/17). I left a voice message with Misty Stanley at Dr. Tobin Chad office regarding 07/21/17 A1c results during her DKA hospitalization. She is now on insulin and had recent DM follow-up. Unless she has an acute change or comes in on the day of surgery with glucose significantly elevated then I would anticipate that she could proceed as planned from an anesthesia standpoint. Would defer to Dr. Sherlean Foot if he wants A1c lower or rechecked prior to surgery (was not rechecked at [facility] because last A1c was within 60 days).  Velna Ochs [facility] Short Stay Center/Anesthesiology Phone [phone] 08/17/2017 1:33 PM

## 2017-08-19 MED ORDER — CEFAZOLIN SODIUM-DEXTROSE 2-4 GM/100ML-% IV SOLN
2.0000 g | INTRAVENOUS | Status: AC
  Administered 2017-08-22: 2 g via INTRAVENOUS
  Filled 2017-08-19: qty 100

## 2017-08-19 MED ORDER — TRANEXAMIC ACID 1000 MG/10ML IV SOLN
1000.0000 mg | INTRAVENOUS | Status: AC
  Administered 2017-08-22: 1000 mg via INTRAVENOUS
  Filled 2017-08-19: qty 1100

## 2017-08-22 ENCOUNTER — Encounter (HOSPITAL_COMMUNITY): Payer: Self-pay | Admitting: Surgery

## 2017-08-22 ENCOUNTER — Inpatient Hospital Stay (HOSPITAL_COMMUNITY): Payer: Commercial Managed Care - PPO | Admitting: Vascular Surgery

## 2017-08-22 ENCOUNTER — Observation Stay (HOSPITAL_COMMUNITY)
Admission: RE | Admit: 2017-08-22 | Discharge: 2017-08-23 | Disposition: A | Discharge status: Home / Self Care | Payer: Commercial Managed Care - PPO | Source: Ambulatory Visit | Attending: Orthopedic Surgery | Admitting: Orthopedic Surgery

## 2017-08-22 ENCOUNTER — Inpatient Hospital Stay (HOSPITAL_COMMUNITY): Payer: Commercial Managed Care - PPO | Admitting: Anesthesiology

## 2017-08-22 ENCOUNTER — Encounter (HOSPITAL_COMMUNITY)
Admission: RE | Disposition: A | Discharge status: Home / Self Care | Payer: Self-pay | Source: Ambulatory Visit | Attending: Orthopedic Surgery

## 2017-08-22 ENCOUNTER — Other Ambulatory Visit: Payer: Self-pay

## 2017-08-22 DIAGNOSIS — E1140 Type 2 diabetes mellitus with diabetic neuropathy, unspecified: Secondary | ICD-10-CM | POA: Insufficient documentation

## 2017-08-22 DIAGNOSIS — F419 Anxiety disorder, unspecified: Secondary | ICD-10-CM | POA: Insufficient documentation

## 2017-08-22 DIAGNOSIS — I10 Essential (primary) hypertension: Secondary | ICD-10-CM | POA: Insufficient documentation

## 2017-08-22 DIAGNOSIS — F17210 Nicotine dependence, cigarettes, uncomplicated: Secondary | ICD-10-CM | POA: Diagnosis not present

## 2017-08-22 DIAGNOSIS — Z794 Long term (current) use of insulin: Secondary | ICD-10-CM | POA: Diagnosis not present

## 2017-08-22 DIAGNOSIS — Z6835 Body mass index (BMI) 35.0-35.9, adult: Secondary | ICD-10-CM | POA: Insufficient documentation

## 2017-08-22 DIAGNOSIS — M25561 Pain in right knee: Secondary | ICD-10-CM | POA: Diagnosis present

## 2017-08-22 DIAGNOSIS — Z89431 Acquired absence of right foot: Secondary | ICD-10-CM | POA: Diagnosis not present

## 2017-08-22 DIAGNOSIS — Y838 Other surgical procedures as the cause of abnormal reaction of the patient, or of later complication, without mention of misadventure at the time of the procedure: Secondary | ICD-10-CM | POA: Diagnosis not present

## 2017-08-22 DIAGNOSIS — Z79899 Other long term (current) drug therapy: Secondary | ICD-10-CM | POA: Diagnosis not present

## 2017-08-22 DIAGNOSIS — Z96659 Presence of unspecified artificial knee joint: Secondary | ICD-10-CM

## 2017-08-22 DIAGNOSIS — K219 Gastro-esophageal reflux disease without esophagitis: Secondary | ICD-10-CM | POA: Diagnosis not present

## 2017-08-22 DIAGNOSIS — T8484XA Pain due to internal orthopedic prosthetic devices, implants and grafts, initial encounter: Secondary | ICD-10-CM | POA: Diagnosis not present

## 2017-08-22 DIAGNOSIS — Z7982 Long term (current) use of aspirin: Secondary | ICD-10-CM | POA: Diagnosis not present

## 2017-08-22 DIAGNOSIS — F329 Major depressive disorder, single episode, unspecified: Secondary | ICD-10-CM | POA: Insufficient documentation

## 2017-08-22 HISTORY — PX: TOTAL KNEE REVISION: SHX996

## 2017-08-22 LAB — GLUCOSE, CAPILLARY
Glucose-Capillary: 151 mg/dL — ABNORMAL HIGH (ref 65–99)
Glucose-Capillary: 137 mg/dL — ABNORMAL HIGH (ref 65–99)
Glucose-Capillary: 237 mg/dL — ABNORMAL HIGH (ref 65–99)
Glucose-Capillary: 299 mg/dL — ABNORMAL HIGH (ref 65–99)

## 2017-08-22 SURGERY — TOTAL KNEE REVISION
Anesthesia: General | Site: Knee | Laterality: Right

## 2017-08-22 MED ORDER — GABAPENTIN 400 MG PO CAPS
800.0000 mg | ORAL_CAPSULE | Freq: Four times a day (QID) | ORAL | Status: DC
  Administered 2017-08-22 – 2017-08-23 (×3): 800 mg via ORAL
  Filled 2017-08-22 (×3): qty 2

## 2017-08-22 MED ORDER — MEPERIDINE HCL 50 MG/ML IJ SOLN: 6.2500 mg | INTRAMUSCULAR | Status: DC | PRN

## 2017-08-22 MED ORDER — HYDROMORPHONE HCL 1 MG/ML IJ SOLN: 1.0000 mg | INTRAMUSCULAR | Status: DC | PRN

## 2017-08-22 MED ORDER — SODIUM CHLORIDE 0.9 % IR SOLN
Status: DC | PRN
  Administered 2017-08-22: 3000 mL

## 2017-08-22 MED ORDER — DEXAMETHASONE SODIUM PHOSPHATE 10 MG/ML IJ SOLN
10.0000 mg | Freq: Once | INTRAMUSCULAR | Status: AC
  Administered 2017-08-23: 10 mg via INTRAVENOUS
  Filled 2017-08-22: qty 1

## 2017-08-22 MED ORDER — MIDAZOLAM HCL 2 MG/2ML IJ SOLN
INTRAMUSCULAR | Status: AC
  Filled 2017-08-22: qty 2

## 2017-08-22 MED ORDER — METOCLOPRAMIDE HCL 5 MG/ML IJ SOLN: 5.0000 mg | Freq: Three times a day (TID) | INTRAMUSCULAR | Status: DC | PRN

## 2017-08-22 MED ORDER — BUPIVACAINE LIPOSOME 1.3 % IJ SUSP
20.0000 mL | Freq: Once | INTRAMUSCULAR | Status: DC
Start: 2017-08-22 — End: 2017-08-23
  Filled 2017-08-22: qty 20

## 2017-08-22 MED ORDER — INSULIN ASPART 100 UNIT/ML ~~LOC~~ SOLN: 8.0000 [IU] | Freq: Three times a day (TID) | SUBCUTANEOUS | Status: DC

## 2017-08-22 MED ORDER — MIDAZOLAM HCL 2 MG/2ML IJ SOLN
INTRAMUSCULAR | Status: AC
  Administered 2017-08-22: 2 mg via INTRAVENOUS
  Filled 2017-08-22: qty 2

## 2017-08-22 MED ORDER — INSULIN ASPART 100 UNIT/ML ~~LOC~~ SOLN
2.0000 [IU] | Freq: Every day | SUBCUTANEOUS | Status: DC
  Administered 2017-08-22: 4 [IU] via SUBCUTANEOUS

## 2017-08-22 MED ORDER — DULOXETINE HCL 60 MG PO CPEP
60.0000 mg | ORAL_CAPSULE | Freq: Every day | ORAL | Status: DC
  Administered 2017-08-23: 60 mg via ORAL
  Filled 2017-08-22: qty 1

## 2017-08-22 MED ORDER — ACETAMINOPHEN 10 MG/ML IV SOLN
INTRAVENOUS | Status: AC
  Administered 2017-08-22: 1000 mg via INTRAVENOUS
  Filled 2017-08-22: qty 100

## 2017-08-22 MED ORDER — KETAMINE HCL 10 MG/ML IJ SOLN
INTRAMUSCULAR | Status: DC | PRN
  Administered 2017-08-22: 40 mg via INTRAVENOUS
  Administered 2017-08-22 (×2): 10 mg via INTRAVENOUS
  Administered 2017-08-22: 20 mg via INTRAVENOUS

## 2017-08-22 MED ORDER — LISINOPRIL-HYDROCHLOROTHIAZIDE 20-25 MG PO TABS: 1.0000 | ORAL_TABLET | Freq: Every day | ORAL | Status: DC

## 2017-08-22 MED ORDER — FENTANYL CITRATE (PF) 100 MCG/2ML IJ SOLN
INTRAMUSCULAR | Status: AC
  Administered 2017-08-22: 100 ug via INTRAVENOUS
  Filled 2017-08-22: qty 2

## 2017-08-22 MED ORDER — LACTATED RINGERS IV SOLN
INTRAVENOUS | Status: DC
Start: 2017-08-22 — End: 2017-08-22
  Administered 2017-08-22 (×3): via INTRAVENOUS

## 2017-08-22 MED ORDER — ZOLPIDEM TARTRATE 5 MG PO TABS
5.0000 mg | ORAL_TABLET | Freq: Every evening | ORAL | Status: DC | PRN
Start: 2017-08-22 — End: 2017-08-23

## 2017-08-22 MED ORDER — TRAMADOL HCL 50 MG PO TABS
50.0000 mg | ORAL_TABLET | Freq: Four times a day (QID) | ORAL | Status: DC
  Administered 2017-08-22 – 2017-08-23 (×4): 50 mg via ORAL
  Filled 2017-08-22 (×4): qty 1

## 2017-08-22 MED ORDER — GABAPENTIN 300 MG PO CAPS
300.0000 mg | ORAL_CAPSULE | Freq: Once | ORAL | Status: AC
  Administered 2017-08-22: 300 mg via ORAL
  Filled 2017-08-22: qty 1

## 2017-08-22 MED ORDER — ALUM & MAG HYDROXIDE-SIMETH 200-200-20 MG/5ML PO SUSP: 30.0000 mL | ORAL | Status: DC | PRN

## 2017-08-22 MED ORDER — CHLORHEXIDINE GLUCONATE 4 % EX LIQD: 60.0000 mL | Freq: Once | CUTANEOUS | Status: DC

## 2017-08-22 MED ORDER — DIPHENHYDRAMINE HCL 12.5 MG/5ML PO ELIX: 12.5000 mg | ORAL_SOLUTION | ORAL | Status: DC | PRN

## 2017-08-22 MED ORDER — BUPIVACAINE HCL (PF) 0.75 % IJ SOLN: INTRAMUSCULAR | Status: DC | PRN

## 2017-08-22 MED ORDER — PROPOFOL 10 MG/ML IV BOLUS
INTRAVENOUS | Status: DC | PRN
  Administered 2017-08-22: 200 mg via INTRAVENOUS

## 2017-08-22 MED ORDER — HYDROMORPHONE HCL 1 MG/ML IJ SOLN
INTRAMUSCULAR | Status: DC | PRN
  Administered 2017-08-22 (×4): .25 mg via INTRAVENOUS

## 2017-08-22 MED ORDER — FENTANYL CITRATE (PF) 100 MCG/2ML IJ SOLN
INTRAMUSCULAR | Status: AC
  Administered 2017-08-22: 50 ug via INTRAVENOUS
  Filled 2017-08-22: qty 2

## 2017-08-22 MED ORDER — TRAZODONE HCL 100 MG PO TABS
100.0000 mg | ORAL_TABLET | Freq: Three times a day (TID) | ORAL | Status: DC
  Administered 2017-08-22: 100 mg via ORAL
  Filled 2017-08-22 (×2): qty 1

## 2017-08-22 MED ORDER — ROPIVACAINE HCL 7.5 MG/ML IJ SOLN
INTRAMUSCULAR | Status: DC | PRN
  Administered 2017-08-22: 30 mL via PERINEURAL

## 2017-08-22 MED ORDER — BUPIVACAINE-EPINEPHRINE (PF) 0.5% -1:200000 IJ SOLN
INTRAMUSCULAR | Status: AC
  Filled 2017-08-22: qty 30

## 2017-08-22 MED ORDER — METFORMIN HCL 500 MG PO TABS: 1000.0000 mg | ORAL_TABLET | Freq: Every day | ORAL | Status: DC

## 2017-08-22 MED ORDER — OXYCODONE HCL 5 MG PO TABS
10.0000 mg | ORAL_TABLET | ORAL | Status: DC | PRN
  Administered 2017-08-22: 15 mg via ORAL
  Administered 2017-08-22: 10 mg via ORAL
  Administered 2017-08-22 – 2017-08-23 (×4): 15 mg via ORAL
  Filled 2017-08-22 (×5): qty 3

## 2017-08-22 MED ORDER — DEXMEDETOMIDINE HCL IN NACL 200 MCG/50ML IV SOLN
INTRAVENOUS | Status: AC
  Filled 2017-08-22: qty 50

## 2017-08-22 MED ORDER — DEXAMETHASONE SODIUM PHOSPHATE 10 MG/ML IJ SOLN
8.0000 mg | Freq: Once | INTRAMUSCULAR | Status: AC
  Administered 2017-08-22: 8 mg via INTRAVENOUS
  Filled 2017-08-22: qty 1

## 2017-08-22 MED ORDER — PANTOPRAZOLE SODIUM 40 MG PO TBEC
40.0000 mg | DELAYED_RELEASE_TABLET | Freq: Every day | ORAL | Status: DC
  Administered 2017-08-23: 40 mg via ORAL
  Filled 2017-08-22: qty 1

## 2017-08-22 MED ORDER — ONDANSETRON HCL 4 MG/2ML IJ SOLN: 4.0000 mg | Freq: Once | INTRAMUSCULAR | Status: DC | PRN

## 2017-08-22 MED ORDER — OXYCODONE HCL 5 MG/5ML PO SOLN: 5.0000 mg | Freq: Once | ORAL | Status: DC | PRN

## 2017-08-22 MED ORDER — GABAPENTIN 800 MG PO TABS
800.0000 mg | ORAL_TABLET | Freq: Four times a day (QID) | ORAL | Status: DC
  Filled 2017-08-22 (×2): qty 1

## 2017-08-22 MED ORDER — FENTANYL CITRATE (PF) 100 MCG/2ML IJ SOLN
100.0000 ug | Freq: Once | INTRAMUSCULAR | Status: AC
  Administered 2017-08-22: 100 ug via INTRAVENOUS

## 2017-08-22 MED ORDER — INSULIN ASPART 100 UNIT/ML ~~LOC~~ SOLN
8.0000 [IU] | Freq: Three times a day (TID) | SUBCUTANEOUS | Status: DC
  Filled 2017-08-22 (×27): qty 0.08

## 2017-08-22 MED ORDER — PHENOL 1.4 % MT LIQD: 1.0000 | OROMUCOSAL | Status: DC | PRN

## 2017-08-22 MED ORDER — KETAMINE HCL 10 MG/ML IJ SOLN
INTRAMUSCULAR | Status: AC
  Filled 2017-08-22: qty 1

## 2017-08-22 MED ORDER — HYDROCHLOROTHIAZIDE 25 MG PO TABS
25.0000 mg | ORAL_TABLET | Freq: Every day | ORAL | Status: DC
  Administered 2017-08-23: 25 mg via ORAL
  Filled 2017-08-22: qty 1

## 2017-08-22 MED ORDER — ACETAMINOPHEN 500 MG PO TABS
1000.0000 mg | ORAL_TABLET | Freq: Four times a day (QID) | ORAL | Status: AC
  Administered 2017-08-22 – 2017-08-23 (×4): 1000 mg via ORAL
  Filled 2017-08-22 (×4): qty 2

## 2017-08-22 MED ORDER — ASPIRIN EC 325 MG PO TBEC
325.0000 mg | DELAYED_RELEASE_TABLET | Freq: Two times a day (BID) | ORAL | Status: DC
  Administered 2017-08-22 – 2017-08-23 (×2): 325 mg via ORAL
  Filled 2017-08-22 (×2): qty 1

## 2017-08-22 MED ORDER — FENTANYL CITRATE (PF) 250 MCG/5ML IJ SOLN
INTRAMUSCULAR | Status: AC
  Filled 2017-08-22: qty 5

## 2017-08-22 MED ORDER — ACETAMINOPHEN 160 MG/5ML PO SOLN: 325.0000 mg | ORAL | Status: DC | PRN

## 2017-08-22 MED ORDER — MIDAZOLAM HCL 2 MG/2ML IJ SOLN
2.0000 mg | Freq: Once | INTRAMUSCULAR | Status: AC
  Administered 2017-08-22: 2 mg via INTRAVENOUS

## 2017-08-22 MED ORDER — DEXMEDETOMIDINE HCL IN NACL 200 MCG/50ML IV SOLN
INTRAVENOUS | Status: DC | PRN
  Administered 2017-08-22: .2 ug/kg/h via INTRAVENOUS

## 2017-08-22 MED ORDER — INSULIN ASPART 100 UNIT/ML ~~LOC~~ SOLN
1.0000 [IU] | Freq: Three times a day (TID) | SUBCUTANEOUS | Status: DC
  Administered 2017-08-23: 8 [IU] via SUBCUTANEOUS

## 2017-08-22 MED ORDER — HYDROMORPHONE HCL 1 MG/ML IJ SOLN
INTRAMUSCULAR | Status: AC
  Filled 2017-08-22: qty 0.5

## 2017-08-22 MED ORDER — SODIUM CHLORIDE 0.9 % IJ SOLN
INTRAMUSCULAR | Status: DC | PRN
  Administered 2017-08-22: 20 mL

## 2017-08-22 MED ORDER — MIDAZOLAM HCL 2 MG/2ML IJ SOLN
INTRAMUSCULAR | Status: DC | PRN
  Administered 2017-08-22 (×3): 1 mg via INTRAVENOUS

## 2017-08-22 MED ORDER — DEXTROSE 5 % IV SOLN
INTRAVENOUS | Status: DC | PRN
  Administered 2017-08-22: 25 ug/min via INTRAVENOUS

## 2017-08-22 MED ORDER — FENTANYL CITRATE (PF) 100 MCG/2ML IJ SOLN
25.0000 ug | INTRAMUSCULAR | Status: DC | PRN
  Administered 2017-08-22: 50 ug via INTRAVENOUS

## 2017-08-22 MED ORDER — FLEET ENEMA 7-19 GM/118ML RE ENEM: 1.0000 | ENEMA | Freq: Once | RECTAL | Status: DC | PRN

## 2017-08-22 MED ORDER — LISINOPRIL 20 MG PO TABS
20.0000 mg | ORAL_TABLET | Freq: Every day | ORAL | Status: DC
  Administered 2017-08-23: 20 mg via ORAL
  Filled 2017-08-22: qty 1

## 2017-08-22 MED ORDER — ONDANSETRON HCL 4 MG PO TABS: 4.0000 mg | ORAL_TABLET | Freq: Four times a day (QID) | ORAL | Status: DC | PRN

## 2017-08-22 MED ORDER — INSULIN ASPART 100 UNIT/ML ~~LOC~~ SOLN
6.0000 [IU] | Freq: Once | SUBCUTANEOUS | Status: AC
  Administered 2017-08-22: 6 [IU] via SUBCUTANEOUS

## 2017-08-22 MED ORDER — BUPIVACAINE LIPOSOME 1.3 % IJ SUSP
INTRAMUSCULAR | Status: DC | PRN
  Administered 2017-08-22: 20 mL

## 2017-08-22 MED ORDER — PROPOFOL 500 MG/50ML IV EMUL
INTRAVENOUS | Status: DC | PRN
  Administered 2017-08-22: 25 ug/kg/min via INTRAVENOUS

## 2017-08-22 MED ORDER — VARENICLINE TARTRATE 1 MG PO TABS
1.0000 mg | ORAL_TABLET | Freq: Two times a day (BID) | ORAL | Status: DC
  Administered 2017-08-22 – 2017-08-23 (×2): 1 mg via ORAL
  Filled 2017-08-22 (×2): qty 1

## 2017-08-22 MED ORDER — OXYCODONE HCL 5 MG PO TABS
ORAL_TABLET | ORAL | Status: AC
  Administered 2017-08-22: 10 mg via ORAL
  Filled 2017-08-22: qty 2

## 2017-08-22 MED ORDER — METOCLOPRAMIDE HCL 5 MG PO TABS: 5.0000 mg | ORAL_TABLET | Freq: Three times a day (TID) | ORAL | Status: DC | PRN

## 2017-08-22 MED ORDER — INSULIN ASPART 100 UNIT/ML ~~LOC~~ SOLN
SUBCUTANEOUS | Status: AC
  Administered 2017-08-22: 6 [IU] via SUBCUTANEOUS
  Filled 2017-08-22: qty 1

## 2017-08-22 MED ORDER — OXYCODONE HCL 5 MG PO TABS: 5.0000 mg | ORAL_TABLET | Freq: Once | ORAL | Status: DC | PRN

## 2017-08-22 MED ORDER — INSULIN GLARGINE 100 UNIT/ML ~~LOC~~ SOLN
25.0000 [IU] | Freq: Every day | SUBCUTANEOUS | Status: DC
  Administered 2017-08-22: 25 [IU] via SUBCUTANEOUS
  Filled 2017-08-22 (×3): qty 0.25

## 2017-08-22 MED ORDER — CEFAZOLIN SODIUM-DEXTROSE 2-4 GM/100ML-% IV SOLN
2.0000 g | Freq: Four times a day (QID) | INTRAVENOUS | Status: AC
  Administered 2017-08-22: 2 g via INTRAVENOUS
  Filled 2017-08-22 (×2): qty 100

## 2017-08-22 MED ORDER — EPHEDRINE 5 MG/ML INJ
INTRAVENOUS | Status: AC
  Filled 2017-08-22: qty 10

## 2017-08-22 MED ORDER — ONDANSETRON HCL 4 MG/2ML IJ SOLN
4.0000 mg | Freq: Four times a day (QID) | INTRAMUSCULAR | Status: DC | PRN
Start: 2017-08-22 — End: 2017-08-23

## 2017-08-22 MED ORDER — MENTHOL 3 MG MT LOZG: 1.0000 | LOZENGE | OROMUCOSAL | Status: DC | PRN

## 2017-08-22 MED ORDER — EPHEDRINE SULFATE-NACL 50-0.9 MG/10ML-% IV SOSY
PREFILLED_SYRINGE | INTRAVENOUS | Status: DC | PRN
  Administered 2017-08-22: 5 mg via INTRAVENOUS

## 2017-08-22 MED ORDER — LIDOCAINE 2% (20 MG/ML) 5 ML SYRINGE
INTRAMUSCULAR | Status: DC | PRN
  Administered 2017-08-22: 100 mg via INTRAVENOUS

## 2017-08-22 MED ORDER — ONDANSETRON HCL 4 MG/2ML IJ SOLN
INTRAMUSCULAR | Status: DC | PRN
  Administered 2017-08-22: 4 mg via INTRAVENOUS

## 2017-08-22 MED ORDER — HYDROMORPHONE HCL 2 MG/ML IJ SOLN
1.0000 mg | INTRAMUSCULAR | Status: DC | PRN
  Administered 2017-08-22 – 2017-08-23 (×6): 1 mg via INTRAVENOUS
  Filled 2017-08-22 (×6): qty 1

## 2017-08-22 MED ORDER — DEXMEDETOMIDINE HCL 200 MCG/2ML IV SOLN
INTRAVENOUS | Status: DC | PRN
  Administered 2017-08-22: 20 ug via INTRAVENOUS
  Administered 2017-08-22: 40 ug via INTRAVENOUS
  Administered 2017-08-22: 20 ug via INTRAVENOUS

## 2017-08-22 MED ORDER — DOCUSATE SODIUM 100 MG PO CAPS
100.0000 mg | ORAL_CAPSULE | Freq: Two times a day (BID) | ORAL | Status: DC
  Administered 2017-08-23: 100 mg via ORAL
  Filled 2017-08-22: qty 1

## 2017-08-22 MED ORDER — ACETAMINOPHEN 10 MG/ML IV SOLN
1000.0000 mg | Freq: Once | INTRAVENOUS | Status: AC
  Administered 2017-08-22: 1000 mg via INTRAVENOUS

## 2017-08-22 MED ORDER — ACETAMINOPHEN 500 MG PO TABS
1000.0000 mg | ORAL_TABLET | Freq: Once | ORAL | Status: DC
  Filled 2017-08-22: qty 2

## 2017-08-22 MED ORDER — FENTANYL CITRATE (PF) 100 MCG/2ML IJ SOLN
INTRAMUSCULAR | Status: DC | PRN
  Administered 2017-08-22 (×5): 50 ug via INTRAVENOUS

## 2017-08-22 MED ORDER — TRANEXAMIC ACID 1000 MG/10ML IV SOLN
1000.0000 mg | Freq: Once | INTRAVENOUS | Status: AC
  Administered 2017-08-22: 1000 mg via INTRAVENOUS
  Filled 2017-08-22: qty 10

## 2017-08-22 MED ORDER — ACETAMINOPHEN 325 MG PO TABS: 325.0000 mg | ORAL_TABLET | ORAL | Status: DC | PRN

## 2017-08-22 MED ORDER — BUPIVACAINE-EPINEPHRINE 0.5% -1:200000 IJ SOLN
INTRAMUSCULAR | Status: DC | PRN
  Administered 2017-08-22: 30 mL

## 2017-08-22 MED ORDER — SENNOSIDES-DOCUSATE SODIUM 8.6-50 MG PO TABS: 1.0000 | ORAL_TABLET | Freq: Every evening | ORAL | Status: DC | PRN

## 2017-08-22 MED ORDER — BISACODYL 5 MG PO TBEC: 5.0000 mg | DELAYED_RELEASE_TABLET | Freq: Every day | ORAL | Status: DC | PRN

## 2017-08-22 SURGICAL SUPPLY — 83 items
AUGMENT BLOCK PRCOAT SZ D 5 (Orthopedic Implant) ×4 IMPLANT
BANDAGE ACE 6X5 VEL STRL LF (GAUZE/BANDAGES/DRESSINGS) ×2 IMPLANT
BANDAGE ESMARK 6X9 LF (GAUZE/BANDAGES/DRESSINGS) ×1 IMPLANT
BLADE SAGITTAL 13X1.27X60 (BLADE) ×2 IMPLANT
BLADE SAW SGTL 13X75X1.27 (BLADE) ×2 IMPLANT
BLADE SAW SGTL 83.5X18.5 (BLADE) ×2 IMPLANT
BLADE SAW SGTL NAR THIN XSHT (BLADE) ×2 IMPLANT
BLADE SURG 10 STRL SS (BLADE) ×6 IMPLANT
BNDG CMPR 9X6 STRL LF SNTH (GAUZE/BANDAGES/DRESSINGS) ×1
BNDG ESMARK 6X9 LF (GAUZE/BANDAGES/DRESSINGS) ×2
BOWL SMART MIX CTS (DISPOSABLE) IMPLANT
CEMENT BONE REFOBACIN R1X40 US (Cement) ×4 IMPLANT
COVER SURGICAL LIGHT HANDLE (MISCELLANEOUS) ×2 IMPLANT
CUFF TOURNIQUET SINGLE 34IN LL (TOURNIQUET CUFF) IMPLANT
DRAPE HALF SHEET 40X57 (DRAPES) ×4 IMPLANT
DRAPE IMP U-DRAPE 54X76 (DRAPES) ×2 IMPLANT
DRAPE INCISE IOBAN 66X45 STRL (DRAPES) ×4 IMPLANT
DRAPE U-SHAPE 47X51 STRL (DRAPES) ×2 IMPLANT
DRSG ADAPTIC 3X8 NADH LF (GAUZE/BANDAGES/DRESSINGS) ×2 IMPLANT
DRSG AQUACEL AG ADV 3.5X14 (GAUZE/BANDAGES/DRESSINGS) ×2 IMPLANT
DRSG PAD ABDOMINAL 8X10 ST (GAUZE/BANDAGES/DRESSINGS) ×2 IMPLANT
DURAPREP 26ML APPLICATOR (WOUND CARE) ×4 IMPLANT
ELECT REM PT RETURN 9FT ADLT (ELECTROSURGICAL) ×2
ELECTRODE REM PT RTRN 9FT ADLT (ELECTROSURGICAL) ×1 IMPLANT
EVACUATOR 1/8 PVC DRAIN (DRAIN) ×2 IMPLANT
FACESHIELD WRAPAROUND (MASK) ×2 IMPLANT
FEMORAL COMP ZIMMER KNEE (Orthopedic Implant) ×2 IMPLANT
GAUZE SPONGE 4X4 12PLY STRL (GAUZE/BANDAGES/DRESSINGS) ×2 IMPLANT
GLOVE BIOGEL M 7.0 STRL (GLOVE) ×2 IMPLANT
GLOVE BIOGEL PI IND STRL 7.5 (GLOVE) IMPLANT
GLOVE BIOGEL PI IND STRL 8.5 (GLOVE) ×4 IMPLANT
GLOVE BIOGEL PI INDICATOR 7.5 (GLOVE)
GLOVE BIOGEL PI INDICATOR 8.5 (GLOVE) ×4
GLOVE BIOGEL PI ORTHO PRO SZ8 (GLOVE) ×1
GLOVE PI ORTHO PRO STRL SZ8 (GLOVE) ×1 IMPLANT
GLOVE SURG ORTHO 8.0 STRL STRW (GLOVE) ×12 IMPLANT
GOWN STRL REUS W/ TWL LRG LVL3 (GOWN DISPOSABLE) ×2 IMPLANT
GOWN STRL REUS W/ TWL XL LVL3 (GOWN DISPOSABLE) ×2 IMPLANT
GOWN STRL REUS W/TWL 2XL LVL3 (GOWN DISPOSABLE) ×2 IMPLANT
GOWN STRL REUS W/TWL LRG LVL3 (GOWN DISPOSABLE) ×4
GOWN STRL REUS W/TWL XL LVL3 (GOWN DISPOSABLE) ×4
HANDPIECE INTERPULSE COAX TIP (DISPOSABLE)
HOOD PEEL AWAY FACE SHEILD DIS (HOOD) ×8 IMPLANT
KIT BASIN OR (CUSTOM PROCEDURE TRAY) ×2 IMPLANT
KIT TURNOVER KIT B (KITS) ×2 IMPLANT
LOCK ART SURF NEXGEN CD1-2 17 (Insert) ×1 IMPLANT
MANIFOLD NEPTUNE II (INSTRUMENTS) ×2 IMPLANT
NEEDLE 18GX1X1/2 (RX/OR ONLY) (NEEDLE) IMPLANT
NEEDLE 22X1 1/2 (OR ONLY) (NEEDLE) ×4 IMPLANT
NEX PRCOAT STEM TIBIA PLAT SZ2 (Joint) ×2 IMPLANT
NEXGEN LOCK ART SURF CD1-2 17 (Insert) ×2 IMPLANT
NS IRRIG 1000ML POUR BTL (IV SOLUTION) ×2 IMPLANT
PACK TOTAL JOINT (CUSTOM PROCEDURE TRAY) ×2 IMPLANT
PACK UNIVERSAL I (CUSTOM PROCEDURE TRAY) ×2 IMPLANT
PAD ARMBOARD 7.5X6 YLW CONV (MISCELLANEOUS) ×4 IMPLANT
PADDING CAST COTTON 6X4 STRL (CAST SUPPLIES) ×2 IMPLANT
SET HNDPC FAN SPRY TIP SCT (DISPOSABLE) IMPLANT
STAPLER VISISTAT 35W (STAPLE) ×2 IMPLANT
STEM ST EXT ZIER 100X145X10X (Stem) ×1 IMPLANT
STEM ST EXT ZIER 75X15XROT (Stem) ×1 IMPLANT
STEM ST EXT ZIMMER (Stem) ×4 IMPLANT
STEM TIBIA NEX PRCOAT PLAT SZ2 (Joint) ×1 IMPLANT
SUCTION FRAZIER HANDLE 10FR (MISCELLANEOUS) ×1
SUCTION TUBE FRAZIER 10FR DISP (MISCELLANEOUS) ×1 IMPLANT
SUT BONE WAX W31G (SUTURE) ×2 IMPLANT
SUT PDS AB 0 CT 36 (SUTURE) IMPLANT
SUT PDS AB 1 CT  36 (SUTURE)
SUT PDS AB 1 CT 36 (SUTURE) IMPLANT
SUT PDS AB 2-0 CT1 27 (SUTURE) IMPLANT
SUT VIC AB 0 CTB1 27 (SUTURE) IMPLANT
SUT VIC AB 1 CT1 27 (SUTURE)
SUT VIC AB 1 CT1 27XBRD ANBCTR (SUTURE) IMPLANT
SUT VIC AB 2-0 CTB1 (SUTURE) IMPLANT
SWAB COLLECTION DEVICE MRSA (MISCELLANEOUS) ×2 IMPLANT
SWAB CULTURE ESWAB REG 1ML (MISCELLANEOUS) IMPLANT
SYR 20CC LL (SYRINGE) ×4 IMPLANT
SYR CONTROL 10ML LL (SYRINGE) ×4 IMPLANT
TAPE STRIPS DRAPE STRL (GAUZE/BANDAGES/DRESSINGS) ×4 IMPLANT
TOWEL OR 17X24 6PK STRL BLUE (TOWEL DISPOSABLE) ×2 IMPLANT
TOWEL OR 17X26 10 PK STRL BLUE (TOWEL DISPOSABLE) ×2 IMPLANT
TRAY FOLEY W/METER SILVER 16FR (SET/KITS/TRAYS/PACK) ×2 IMPLANT
WATER STERILE IRR 1000ML POUR (IV SOLUTION) ×6 IMPLANT
WRAP KNEE MAXI GEL POST OP (GAUZE/BANDAGES/DRESSINGS) ×2 IMPLANT

## 2017-08-22 NOTE — H&P (Addendum)
Brittney Sawyer MRN:  [id number] DOB/SEX:  1965-07-12/female  CHIEF COMPLAINT:  Painful right Knee  HISTORY: Patient is a 52 y.o. female presented with a history of pain in the right knee. Onset of symptoms was abrupt starting 8 weeks ago with unchanged course since that time. Prior procedures on the knee include excision arthroplasty. Patient has been treated conservatively with over-the-counter NSAIDs and activity modification. Patient currently rates pain in the knee at 10 out of 10 with activity. There is pain at night.  PAST MEDICAL HISTORY: Patient Active Problem List   Diagnosis Date Noted  . Poorly controlled diabetes mellitus ([place]) 07/26/2017  . DKA (diabetic ketoacidoses) ([place]) 07/20/2017  . Prosthetic joint infection ([place])   . Septic infrapatellar bursitis of right knee   . S/P revision of total knee 06/20/2017  . Septic joint ([place]) 06/09/2017  . History of tobacco use 08/31/2016  . Diabetes mellitus type 2, controlled ([place]) 05/20/2014  . Right knee DJD 01/27/2013  . Vaginal candidiasis 01/27/2013  . Metabolic syndrome 28/78/6767  . Obesity (BMI 35.0-39.9 without comorbidity) 12/19/2012  . History of substance use disorder 01/07/2012  . Menopause 11/25/2011  . DDD (degenerative disc disease), lumbar 11/25/2011  . HTN (hypertension) 10/21/2011  . Mood disorder ([place]) 10/21/2011  . Chronic pain 10/21/2011   Past Medical History:  Diagnosis Date  . Anxiety   . Arthritis    "all my joints" (06/20/2017)  . Depression   . Diabetes mellitus type 2, controlled ([place]) 05/20/2014  . GERD (gastroesophageal reflux disease)    "gone w/diet revision" (06/20/2017)  . Hyperlipidemia   . Hypertension   . Infection    right knee replacement  . Neuropathy    post back surgery  . Pneumonia X 1  . Poorly controlled diabetes mellitus ([place]) 07/26/2017  . Right knee DJD 01/27/2013  . Spinal headache   . Substance abuse ([place])    Alcohol - sober since 1996   Past Surgical History:   Procedure Laterality Date  . AMPUTATION Right 10/03/2015   Procedure: Right Foot 3rd Ray Amputation;  Surgeon: Newt Minion, MD;  Location: [facility];  Service: Orthopedics;  Laterality: Right;  . BACK SURGERY    . BUNIONECTOMY WITH HAMMERTOE RECONSTRUCTION Bilateral   . EXCISIONAL TOTAL KNEE ARTHROPLASTY WITH ANTIBIOTIC SPACERS Right 06/20/2017  . EXCISIONAL TOTAL KNEE ARTHROPLASTY WITH ANTIBIOTIC SPACERS Right 06/20/2017   Procedure: EXCISION RIGHT TOTAL KNEE ARTHROPLASTY WITH ANTIBIOTIC SPACERS;  Surgeon: Vickey Huger, MD;  Location: [facility];  Service: Orthopedics;  Laterality: Right;  . JOINT REPLACEMENT    . POSTERIOR LUMBAR FUSION  11/2007   L4, L5   . POSTERIOR LUMBAR FUSION  01/2008     second surgery L2-L5 and S1  . SHOULDER ARTHROSCOPY W/ ROTATOR CUFF REPAIR Left 01/2017  . TOTAL KNEE ARTHROPLASTY Right 01/26/2013   Procedure: TOTAL KNEE ARTHROPLASTY;  Surgeon: Yvette Rack., MD;  Location: [facility];  Service: Orthopedics;  Laterality: Right;  . TUBAL LIGATION    . VAGINAL HYSTERECTOMY       MEDICATIONS:   Medications Prior to Admission  Medication Sig Dispense Refill Last Dose  . acetaminophen (TYLENOL) 500 MG tablet Take 2 tablets (1,000 mg total) by mouth every 8 (eight) hours as needed. (Patient taking differently: Take 1,000 mg by mouth every 8 (eight) hours. ) 30 tablet 0 08/22/2017 at 0630  . aspirin EC 81 MG tablet Take 81 mg by mouth 2 (two) times daily.    Past Month at Unknown  time  . aspirin EC 81 MG tablet Take 81 mg by mouth 2 (two) times daily.   Past Month at Unknown time  . B Complex-C (B-COMPLEX WITH VITAMIN C) tablet Take 1 tablet by mouth daily.   08/21/2017 at Unknown time  . diphenhydrAMINE (BENADRYL) 25 mg capsule Take 50 mg by mouth at bedtime.   08/21/2017 at Unknown time  . DULoxetine (CYMBALTA) 60 MG capsule TAKE ONE CAPSULE BY MOUTH TWICE A DAY **MUST CALL MD FOR APPOINTMENT 60 capsule 0 08/22/2017 at 0630  . gabapentin (NEURONTIN) 800 MG tablet Take 1 tablet  (800 mg total) by mouth 4 (four) times daily. Office visit needed 60 tablet 0 08/22/2017 at 0630  . insulin aspart (NOVOLOG FLEXPEN) 100 UNIT/ML FlexPen Inject 8 Units into the skin 3 (three) times daily with meals. 15 mL 0 08/22/2017 at 0630  . Insulin Glargine (LANTUS) 100 UNIT/ML Solostar Pen Inject 25 Units into the skin daily at 10 pm. 15 mL 0 08/21/2017 at Unknown time  . Insulin Pen Needle 32G X 8 MM MISC Use as directed 100 each 0 08/22/2017 at Unknown time  . lisinopril-hydrochlorothiazide (PRINZIDE,ZESTORETIC) 20-25 MG tablet TAKE ONE TABLET BY MOUTH DAILY 90 tablet 3 08/21/2017 at Unknown time  . metFORMIN (GLUCOPHAGE) 500 MG tablet Take 1 tablet (500 mg total) by mouth 2 (two) times daily with a meal. (Patient taking differently: Take 1,000 mg by mouth at bedtime. ) 90 tablet 0 08/21/2017 at Unknown time  . methocarbamol (ROBAXIN) 500 MG tablet Take 1-2 tablets (500-1,000 mg total) by mouth every 6 (six) hours as needed for muscle spasms. 60 tablet 0 Past Month at Unknown time  . Multiple Vitamin (MULTIVITAMIN) tablet Take 1 tablet by mouth daily.   08/21/2017 at Unknown time  . traZODone (DESYREL) 100 MG tablet TAKE ONE TABLET BY MOUTH THREE TIMES A DAY -- NEED OFFICE VISIT (Patient taking differently: TAKE TWO-THREE TABLETS (300 MG) BY MOUTH AT BEDTIME FOR SLEEP.) 90 tablet 0 08/21/2017 at Unknown time  . varenicline (CHANTIX) 1 MG tablet Take 1 mg by mouth 2 (two) times daily.   08/21/2017 at Unknown time  . acyclovir (ZOVIRAX) 200 MG capsule TAKE TWO CAPSULES BY MOUTH THREE TIMES A DAY FOR 5 DAYS AS NEEDED FOR OUTBREAK (Patient not taking: Reported on 08/03/2017) 60 capsule 0 Not Taking at Unknown time  . aspirin EC 325 MG EC tablet Take 1 tablet (325 mg total) by mouth 2 (two) times daily. (Patient not taking: Reported on 08/03/2017) 30 tablet 0 Not Taking at Unknown time  . cefTRIAXone (ROCEPHIN) IVPB Inject 2 g into the vein daily. Indication: Septic Joint Last Day of Therapy:  3/25 Labs - Once  weekly:  CBC/D and BMP, Labs - Every other week:  ESR and CRP 41 Units 0 Taking  . NICOTINE TD Place 1 patch onto the skin daily.   Unknown at Unknown time  . oxyCODONE 10 MG TABS Take 1 tablet (10 mg total) by mouth every 4 (four) hours as needed for moderate pain ((score 4 to 6)). (Patient not taking: Reported on 08/03/2017) 40 tablet 0 Not Taking at Unknown time  . vancomycin IVPB Inject 1,500 mg into the vein every 12 (twelve) hours. Indication: Septic Joint Last Day of Therapy: 08/01/17 Labs - Sunday/Monday:  CBC/D, BMP, and vancomycin trough. Labs - Thursday:  BMP and vancomycin trough Labs - Every other week:  ESR and CRP 82 Units 0 Taking    ALLERGIES:   Allergies  Allergen Reactions  .  Celebrex [Celecoxib] Swelling    SWELLING REACTION UNSPECIFIED   . Wellbutrin [Bupropion] Rash    REVIEW OF SYSTEMS:  A comprehensive review of systems was negative except for: Musculoskeletal: positive for arthralgias and bone pain   FAMILY HISTORY:   Family History  Problem Relation Age of Onset  . Thyroid disease Mother   . Hypertension Father   . Anxiety disorder Sister   . Cancer Paternal Grandmother        prostate    SOCIAL HISTORY:   Social History   Tobacco Use  . Smoking status: Current Every Day Smoker    Packs/day: 0.50    Years: 15.00    Pack years: 7.50    Types: Cigarettes  . Smokeless tobacco: Never Used  . Tobacco comment: Pt on Chantix  Substance Use Topics  . 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     EXAMINATION:  Vital signs in last 24 hours: Temp:  [98.7 F (37.1 C)] 98.7 F (37.1 C) (04/15 0835) Pulse Rate:  [80] 80 (04/15 0835) Resp:  [18] 18 (04/15 0835) BP: (115)/(51) 115/51 (04/15 0835) SpO2:  [99 %] 99 % (04/15 0835) Weight:  [82.6 kg (182 lb)] 82.6 kg (182 lb) (04/15 0835)  BP (!) 115/51   Pulse 80   Temp 98.7 F (37.1 C) (Oral)   Resp 18   Ht 5' (1.524 m)    Wt 82.6 kg (182 lb)   SpO2 99%   BMI 35.54 kg/m   General Appearance:    Alert, cooperative, no distress, appears stated age  Head:    Normocephalic, without obvious abnormality, atraumatic  Eyes:    PERRL, conjunctiva/corneas clear, EOM's intact, fundi    benign, both eyes  Ears:    Normal TM's and external ear canals, both ears  Nose:   Nares normal, septum midline, mucosa normal, no drainage    or sinus tenderness  Throat:   Lips, mucosa, and tongue normal; teeth and gums normal  Neck:   Supple, symmetrical, trachea midline, no adenopathy;    thyroid:  no enlargement/tenderness/nodules; no carotid   bruit or JVD  Back:     Symmetric, no curvature, ROM normal, no CVA tenderness  Lungs:     Clear to auscultation bilaterally, respirations unlabored  Chest Wall:    No tenderness or deformity   Heart:    Regular rate and rhythm, S1 and S2 normal, no murmur, rub   or gallop  Breast Exam:    No tenderness, masses, or nipple abnormality  Abdomen:     Soft, non-tender, bowel sounds active all four quadrants,    no masses, no organomegaly  Genitalia:    Normal female without lesion, discharge or tenderness  Rectal:    Normal tone, no masses or tenderness;   guaiac negative stool  Extremities:   Extremities normal, atraumatic, no cyanosis or edema  Pulses:   2+ and symmetric all extremities  Skin:   Skin color, texture, turgor normal, no rashes or lesions  Lymph nodes:   Cervical, supraclavicular, and axillary nodes normal  Neurologic:   CNII-XII intact, normal strength, sensation and reflexes    throughout    Musculoskeletal:  ROM 0-120, Ligaments intact,  Imaging Review Plain radiographs demonstrate s/p excision arthroplasty  of the right knee. The overall alignment is neutral. The bone quality appears to be good for age and reported activity level.  Assessment/Plan: S/p excision arthroplasty with antibiotic spacer  The patient history, physical examination and imaging studies are  consistent with s/p excision arthroplasty  of the right knee. The patient has failed conservative treatment.  The clearance notes were reviewed.  After discussion with the patient it was felt that Total Knee Revision was indicated. The procedure,  risks, and benefits of total knee revision were presented and reviewed. The risks including but not limited to aseptic loosening, infection, blood clots, vascular injury, stiffness, patella tracking problems complications among others were discussed. The patient acknowledged the explanation, agreed to proceed with the plan.  [name] 08/22/2017, 9:13 AM    Patient's anticipated LOS is less than 2 midnights, meeting these requirements: - Younger than 26 - Lives within 1 hour of care - Has a competent adult at home to recover with post-op recover - NO history of  - Chronic pain requiring opiods  - Coronary Artery Disease  - Heart failure  - Heart attack  - Stroke  - DVT/VTE  - Cardiac arrhythmia  - Respiratory Failure/COPD  - Renal failure  - Anemia  - Advanced Liver disease

## 2017-08-22 NOTE — Anesthesia Procedure Notes (Signed)
Anesthesia Regional Block: Adductor canal block   Pre-Anesthetic Checklist: ,, timeout performed, Correct Patient, Correct Site, Correct Laterality, Correct Procedure, Correct Position, site marked, Risks and benefits discussed,  Surgical consent,  Pre-op evaluation,  At surgeon's request and post-op pain management  Laterality: Right  Prep: chloraprep       Needles:  Injection technique: Single-shot  Needle Type: Echogenic Stimulator Needle     Needle Length: 5cm  Needle Gauge: 22     Additional Needles:   Procedures:, nerve stimulator,,, ultrasound used (permanent image in chart),,,,  Narrative:  Start time: 08/22/2017 9:46 AM End time: 08/22/2017 9:50 AM Injection made incrementally with aspirations every 5 mL.  Performed by: Personally  Anesthesiologist: Bethena Midget, MD  Additional Notes: Functioning IV was confirmed and monitors were applied.  A 29mm 22ga Arrow echogenic stimulator needle was used. Sterile prep and drape,hand hygiene and sterile gloves were used. Ultrasound guidance: relevant anatomy identified, needle position confirmed, local anesthetic spread visualized around nerve(s)., vascular puncture avoided.  Image printed for medical record. Negative aspiration and negative test dose prior to incremental administration of local anesthetic. The patient tolerated the procedure well.

## 2017-08-22 NOTE — Transfer of Care (Signed)
Immediate Anesthesia Transfer of Care Note  Patient: Brittney Sawyer  Procedure(s) Performed: RIGHT TOTAL KNEE REVISION WUITH REMOVAL OF ANTIBIOTIC SPACER (Right Knee)  Patient Location: PACU  Anesthesia Type:General  Level of Consciousness: awake, alert , oriented and patient cooperative  Airway & Oxygen Therapy: Patient Spontanous Breathing and Patient connected to nasal cannula oxygen  Post-op Assessment: Report given to RN, Post -op Vital signs reviewed and stable and Patient moving all extremities X 4  Post vital signs: Reviewed and stable  Last Vitals:  Vitals Value Taken Time  BP 115/59 08/22/2017  3:27 PM  Temp    Pulse 81 08/22/2017  3:29 PM  Resp 12 08/22/2017  3:29 PM  SpO2 98 % 08/22/2017  3:29 PM  Vitals shown include unvalidated device data.  Last Pain:  Vitals:   08/22/17 0851  TempSrc:   PainSc: 4       Patients Stated Pain Goal: 0 (08/22/17 0851)  Complications: No apparent anesthesia complications

## 2017-08-22 NOTE — Progress Notes (Signed)
Orthopedic Tech Progress Note Patient Details:  Brittney Sawyer 01/31/1966 [id number]  CPM Right Knee CPM Right Knee: On Right Knee Flexion (Degrees): 90 Right Knee Extension (Degrees): 0 Additional Comments: foot roll  Post Interventions Patient Tolerated: Well Instructions Provided: Care of device, Adjustment of device  Saul Fordyce 08/22/2017, 4:02 PM

## 2017-08-22 NOTE — Anesthesia Procedure Notes (Signed)
Procedure Name: LMA Insertion Date/Time: 08/22/2017 11:28 AM Performed by: Demetrio Lapping, CRNA Pre-anesthesia Checklist: Patient identified, Emergency Drugs available, Suction available and Patient being monitored Patient Re-evaluated:Patient Re-evaluated prior to induction Oxygen Delivery Method: Circle System Utilized Preoxygenation: Pre-oxygenation with 100% oxygen Induction Type: IV induction Ventilation: Mask ventilation without difficulty LMA: LMA inserted LMA Size: 4.0 Number of attempts: 1 Placement Confirmation: positive ETCO2 Tube secured with: Tape Dental Injury: Teeth and Oropharynx as per pre-operative assessment

## 2017-08-22 NOTE — Anesthesia Preprocedure Evaluation (Addendum)
Anesthesia Evaluation  Patient identified by MRN, date of birth, ID band Patient awake    Reviewed: Allergy & Precautions, H&P , NPO status , Patient's Chart, lab work & pertinent test results  History of Anesthesia Complications (+) POST - OP SPINAL HEADACHE and history of anesthetic complications  Airway Mallampati: III  TM Distance: >3 FB Neck ROM: Full    Dental no notable dental hx. (+) Teeth Intact, Dental Advisory Given   Pulmonary Current Smoker,    Pulmonary exam normal breath sounds clear to auscultation       Cardiovascular hypertension, Pt. on medications  Rhythm:Regular Rate:Normal     Neuro/Psych  Headaches, PSYCHIATRIC DISORDERS Anxiety Depression    GI/Hepatic Neg liver ROS, GERD  Medicated and Controlled,  Endo/Other  diabetes, Type 2, Oral Hypoglycemic AgentsMorbid obesity  Renal/GU negative Renal ROS  negative genitourinary   Musculoskeletal  (+) Arthritis , Osteoarthritis,    Abdominal   Peds  Hematology negative hematology ROS (+)   Anesthesia Other Findings   Reproductive/Obstetrics negative OB ROS                             Anesthesia Physical  Anesthesia Plan  ASA: III  Anesthesia Plan: General   Post-op Pain Management:  Regional for Post-op pain   Induction: Intravenous  PONV Risk Score and Plan: 3 and Propofol infusion, Ondansetron and Midazolam  Airway Management Planned: LMA  Additional Equipment:   Intra-op Plan:   Post-operative Plan:   Informed Consent: I have reviewed the patients History and Physical, chart, labs and discussed the procedure including the risks, benefits and alternatives for the proposed anesthesia with the patient or authorized representative who has indicated his/her understanding and acceptance.   Dental advisory given  Plan Discussed with: CRNA, Anesthesiologist and Surgeon  Anesthesia Plan Comments:         Anesthesia Quick Evaluation

## 2017-08-23 DIAGNOSIS — T8484XA Pain due to internal orthopedic prosthetic devices, implants and grafts, initial encounter: Secondary | ICD-10-CM | POA: Diagnosis not present

## 2017-08-23 LAB — CBC
HEMATOCRIT: 30.3 % — AB (ref 36.0–46.0)
Hemoglobin: 9.7 g/dL — ABNORMAL LOW (ref 12.0–15.0)
MCH: 30.9 pg (ref 26.0–34.0)
MCHC: 32 g/dL (ref 30.0–36.0)
MCV: 96.5 fL (ref 78.0–100.0)
PLATELETS: 266 10*3/uL (ref 150–400)
RBC: 3.14 MIL/uL — ABNORMAL LOW (ref 3.87–5.11)
RDW: 13.2 % (ref 11.5–15.5)
WBC: 11.1 10*3/uL — AB (ref 4.0–10.5)

## 2017-08-23 LAB — BASIC METABOLIC PANEL
ANION GAP: 9 (ref 5–15)
BUN: 20 mg/dL (ref 6–20)
CALCIUM: 8.2 mg/dL — AB (ref 8.9–10.3)
CO2: 24 mmol/L (ref 22–32)
CREATININE: 0.81 mg/dL (ref 0.44–1.00)
Chloride: 103 mmol/L (ref 101–111)
GFR calc Af Amer: >60 mL/min (ref >60)
GLUCOSE: 178 mg/dL — AB (ref 65–99)
Potassium: 4.1 mmol/L (ref 3.5–5.1)
Sodium: 136 mmol/L (ref 135–145)

## 2017-08-23 LAB — GLUCOSE, CAPILLARY: GLUCOSE-CAPILLARY: 172 mg/dL — AB (ref 65–99)

## 2017-08-23 MED ORDER — ASPIRIN 325 MG PO TBEC: 325.0000 mg | DELAYED_RELEASE_TABLET | Freq: Two times a day (BID) | ORAL | 0 refills | Status: DC

## 2017-08-23 MED ORDER — OXYCODONE HCL 10 MG PO TABS: 10.0000 mg | ORAL_TABLET | ORAL | 0 refills | Status: DC | PRN

## 2017-08-23 MED ORDER — METHOCARBAMOL 500 MG PO TABS: 500.0000 mg | ORAL_TABLET | Freq: Four times a day (QID) | ORAL | 0 refills | Status: DC

## 2017-08-23 MED ORDER — HYDROMORPHONE HCL 2 MG PO TABS: 2.0000 mg | ORAL_TABLET | ORAL | 0 refills | Status: DC | PRN

## 2017-08-23 NOTE — Discharge Summary (Signed)
SPORTS MEDICINE & JOINT REPLACEMENT   Brittney Spurling, MD   Brittney Nancy, PA-C [address] [place], [place], [place]  [address]                             [phone]  PATIENT ID: Brittney Sawyer        MRN:  [id number]          DOB/AGE: 53-Dec-1967 / 53 y.o.    DISCHARGE SUMMARY  ADMISSION DATE:    08/22/2017 DISCHARGE DATE:   08/23/2017   ADMISSION DIAGNOSIS: Status post removal right total knee arthroplasty with antibiotic spacer    DISCHARGE DIAGNOSIS:  Status post removal right total knee arthroplasty with antibiotic spacer    ADDITIONAL DIAGNOSIS: Active Problems:   S/P revision of total knee  Past Medical History:  Diagnosis Date  . Anxiety   . Arthritis    "all my joints" (06/20/2017)  . Depression   . Diabetes mellitus type 2, controlled (HCC) 05/20/2014  . GERD (gastroesophageal reflux disease)    "gone w/diet revision" (06/20/2017)  . Hyperlipidemia   . Hypertension   . Infection    right knee replacement  . Neuropathy    post back surgery  . Pneumonia X 1  . Poorly controlled diabetes mellitus (HCC) 07/26/2017  . Right knee DJD 01/27/2013  . Spinal headache   . Substance abuse (HCC)    Alcohol - sober since 1996    PROCEDURE: Procedure(s): RIGHT TOTAL KNEE REVISION WUITH REMOVAL OF ANTIBIOTIC SPACER on 08/22/2017  CONSULTS:    HISTORY:  See H&P in chart  HOSPITAL COURSE:  Brittney Sawyer is a 52 y.o. admitted on 08/22/2017 and found to have a diagnosis of Status post removal right total knee arthroplasty with antibiotic spacer.  After appropriate laboratory studies were obtained  they were taken to the operating room on 08/22/2017 and underwent Procedure(s): RIGHT TOTAL KNEE REVISION WUITH REMOVAL OF ANTIBIOTIC SPACER.   They were given perioperative antibiotics:  Anti-infectives (From admission, onward)   Start     Dose/Rate Route Frequency Ordered Stop   08/22/17 1830  ceFAZolin (ANCEF) IVPB 2g/100 mL premix     2 g 200 mL/hr over 30 Minutes Intravenous  Every 6 hours 08/22/17 1717 08/23/17 0629   08/22/17 0830  ceFAZolin (ANCEF) IVPB 2g/100 mL premix     2 g 200 mL/hr over 30 Minutes Intravenous To ShortStay Surgical 08/19/17 1052 08/22/17 1121    .  Patient given tranexamic acid IV or topical and exparel intra-operatively.  Tolerated the procedure well.    POD# 1: Vital signs were stable.  Patient denied Chest pain, shortness of breath, or calf pain.  Patient was started on Lovenox 30 mg subcutaneously twice daily at 8am.  Consults to PT, OT, and care management were made.  The patient was weight bearing as tolerated.  CPM was placed on the operative leg 0-90 degrees for 6-8 hours a day. When out of the CPM, patient was placed in the foam block to achieve full extension. Incentive spirometry was taught.  Dressing was changed.       POD #2, Continued  PT for ambulation and exercise program.  IV saline locked.  O2 discontinued.    The remainder of the hospital course was dedicated to ambulation and strengthening.   The patient was discharged on 1 Day Post-Op in  Good condition.  Blood products given:none  DIAGNOSTIC STUDIES: Recent vital signs:  Patient  Vitals for the past 24 hrs:  BP Temp Temp src Pulse Resp SpO2  08/23/17 0900 108/67 98.4 F (36.9 C) Oral 78 16 99 %  08/23/17 0453 (!) 96/59 (!) 97.5 F (36.4 C) Oral 75 - 99 %  08/22/17 1938 95/60 98.5 F (36.9 C) Oral 94 - 97 %  08/22/17 1645 109/66 98.1 F (36.7 C) - 82 12 98 %  08/22/17 1615 110/69 - - - 13 99 %  08/22/17 1600 113/70 - - 83 13 99 %  08/22/17 1530 (!) 115/59 98.5 F (36.9 C) - 79 16 96 %       Recent laboratory studies: Recent Labs    08/16/17 1511 08/23/17 0624  WBC 10.3 11.1*  HGB 12.2 9.7*  HCT 38.0 30.3*  PLT 311 266   Recent Labs    08/16/17 1511 08/23/17 0624  NA 137 136  K 4.2 4.1  CL 101 103  CO2 23 24  BUN 21* 20  CREATININE 1.01* 0.81  GLUCOSE 157* 178*  CALCIUM 9.4 8.2*   Lab Results  Component Value Date   INR 0.98  01/26/2013   INR 0.93 01/04/2013   INR 1.0 12/09/2007     Recent Radiographic Studies :  No results found.  DISCHARGE INSTRUCTIONS: Discharge Instructions    CPM   Complete by:  As directed    Continuous passive motion machine (CPM):      Use the CPM from 0 to 90 for 4-6 hours per day.      You may increase by 10 per day.  You may break it up into 2 or 3 sessions per day.      Use CPM for 2 weeks or until you are told to stop.   Call MD / Call 911   Complete by:  As directed    If you experience chest pain or shortness of breath, CALL 911 and be transported to the hospital emergency room.  If you develope a fever above 101 F, pus (white drainage) or increased drainage or redness at the wound, or calf pain, call your surgeon's office.   Constipation Prevention   Complete by:  As directed    Drink plenty of fluids.  Prune juice may be helpful.  You may use a stool softener, such as Colace (over the counter) 100 mg twice a day.  Use MiraLax (over the counter) for constipation as needed.   Diet - low sodium heart healthy   Complete by:  As directed    Discharge instructions   Complete by:  As directed    INSTRUCTIONS AFTER JOINT REPLACEMENT   Remove items at home which could result in a fall. This includes throw rugs or furniture in walking pathways ICE to the affected joint every three hours while awake for 30 minutes at a time, for at least the first 3-5 days, and then as needed for pain and swelling.  Continue to use ice for pain and swelling. You may notice swelling that will progress down to the foot and ankle.  This is normal after surgery.  Elevate your leg when you are not up walking on it.   Continue to use the breathing machine you got in the hospital (incentive spirometer) which will help keep your temperature down.  It is common for your temperature to cycle up and down following surgery, especially at night when you are not up moving around and exerting yourself.  The breathing  machine keeps your lungs expanded and your temperature  down.   DIET:  As you were doing prior to hospitalization, we recommend a well-balanced diet.  DRESSING / WOUND CARE / SHOWERING  Keep the surgical dressing until follow up.  The dressing is water proof, so you can shower without any extra covering.  IF THE DRESSING FALLS OFF or the wound gets wet inside, change the dressing with sterile gauze.  Please use good hand washing techniques before changing the dressing.  Do not use any lotions or creams on the incision until instructed by your surgeon.    ACTIVITY  Increase activity slowly as tolerated, but follow the weight bearing instructions below.   No driving for 6 weeks or until further direction given by your physician.  You cannot drive while taking narcotics.  No lifting or carrying greater than 10 lbs. until further directed by your surgeon. Avoid periods of inactivity such as sitting longer than an hour when not asleep. This helps prevent blood clots.  You may return to work once you are authorized by your doctor.     WEIGHT BEARING   Weight bearing as tolerated with assist device (walker, cane, etc) as directed, use it as long as suggested by your surgeon or therapist, typically at least 4-6 weeks.   EXERCISES  Results after joint replacement surgery are often greatly improved when you follow the exercise, range of motion and muscle strengthening exercises prescribed by your doctor. Safety measures are also important to protect the joint from further injury. Any time any of these exercises cause you to have increased pain or swelling, decrease what you are doing until you are comfortable again and then slowly increase them. If you have problems or questions, call your caregiver or physical therapist for advice.   Rehabilitation is important following a joint replacement. After just a few days of immobilization, the muscles of the leg can become weakened and shrink (atrophy).   These exercises are designed to build up the tone and strength of the thigh and leg muscles and to improve motion. Often times heat used for twenty to thirty minutes before working out will loosen up your tissues and help with improving the range of motion but do not use heat for the first two weeks following surgery (sometimes heat can increase post-operative swelling).   These exercises can be done on a training (exercise) mat, on the floor, on a table or on a bed. Use whatever works the best and is most comfortable for you.    Use music or television while you are exercising so that the exercises are a pleasant break in your day. This will make your life better with the exercises acting as a break in your routine that you can look forward to.   Perform all exercises about fifteen times, three times per day or as directed.  You should exercise both the operative leg and the other leg as well.   Exercises include:   Quad Sets - Tighten up the muscle on the front of the thigh (Quad) and hold for 5-10 seconds.   Straight Leg Raises - With your knee straight (if you were given a brace, keep it on), lift the leg to 60 degrees, hold for 3 seconds, and slowly lower the leg.  Perform this exercise against resistance later as your leg gets stronger.  Leg Slides: Lying on your back, slowly slide your foot toward your buttocks, bending your knee up off the floor (only go as far as is comfortable). Then slowly slide your foot  back down until your leg is flat on the floor again.  Angel Wings: Lying on your back spread your legs to the side as far apart as you can without causing discomfort.  Hamstring Strength:  Lying on your back, push your heel against the floor with your leg straight by tightening up the muscles of your buttocks.  Repeat, but this time bend your knee to a comfortable angle, and push your heel against the floor.  You may put a pillow under the heel to make it more comfortable if necessary.   A  rehabilitation program following joint replacement surgery can speed recovery and prevent re-injury in the future due to weakened muscles. Contact your doctor or a physical therapist for more information on knee rehabilitation.    CONSTIPATION  Constipation is defined medically as fewer than three stools per week and severe constipation as less than one stool per week.  Even if you have a regular bowel pattern at home, your normal regimen is likely to be disrupted due to multiple reasons following surgery.  Combination of anesthesia, postoperative narcotics, change in appetite and fluid intake all can affect your bowels.   YOU MUST use at least one of the following options; they are listed in order of increasing strength to get the job done.  They are all available over the counter, and you may need to use some, POSSIBLY even all of these options:    Drink plenty of fluids (prune juice may be helpful) and high fiber foods Colace 100 mg by mouth twice a day  Senokot for constipation as directed and as needed Dulcolax (bisacodyl), take with full glass of water  Miralax (polyethylene glycol) once or twice a day as needed.  If you have tried all these things and are unable to have a bowel movement in the first 3-4 days after surgery call either your surgeon or your primary doctor.    If you experience loose stools or diarrhea, hold the medications until you stool forms back up.  If your symptoms do not get better within 1 week or if they get worse, check with your doctor.  If you experience "the worst abdominal pain ever" or develop nausea or vomiting, please contact the office immediately for further recommendations for treatment.   ITCHING:  If you experience itching with your medications, try taking only a single pain pill, or even half a pain pill at a time.  You can also use Benadryl over the counter for itching or also to help with sleep.   TED HOSE STOCKINGS:  Use stockings on both legs until  for at least 2 weeks or as directed by physician office. They may be removed at night for sleeping.  MEDICATIONS:  See your medication summary on the "After Visit Summary" that nursing will review with you.  You may have some home medications which will be placed on hold until you complete the course of blood thinner medication.  It is important for you to complete the blood thinner medication as prescribed.  PRECAUTIONS:  If you experience chest pain or shortness of breath - call 911 immediately for transfer to the hospital emergency department.   If you develop a fever greater that 101 F, purulent drainage from wound, increased redness or drainage from wound, foul odor from the wound/dressing, or calf pain - CONTACT YOUR SURGEON.  FOLLOW-UP APPOINTMENTS:  If you do not already have a post-op appointment, please call the office for an appointment to be seen by your surgeon.  Guidelines for how soon to be seen are listed in your "After Visit Summary", but are typically between 1-4 weeks after surgery.  OTHER INSTRUCTIONS:   Knee Replacement:  Do not place pillow under knee, focus on keeping the knee straight while resting. CPM instructions: 0-90 degrees, 2 hours in the morning, 2 hours in the afternoon, and 2 hours in the evening. Place foam block, curve side up under heel at all times except when in CPM or when walking.  DO NOT modify, tear, cut, or change the foam block in any way.  MAKE SURE YOU:  Understand these instructions.  Get help right away if you are not doing well or get worse.    Thank you for letting us be a part of your medical care team.  It is a privilege we respect greatly.  We hope these instructions will help you stay on track for a fast and full recovery!   Increase activity slowly as tolerated   Complete by:  As directed       DISCHARGE MEDICATIONS:   Allergies as of 08/23/2017      Reactions   Celebrex [celecoxib]  Swelling   SWELLING REACTION UNSPECIFIED    Wellbutrin [bupropion] Rash      Medication List    STOP taking these medications   cefTRIAXone IVPB Commonly known as:  ROCEPHIN   vancomycin IVPB     TAKE these medications   acetaminophen 500 MG tablet Commonly known as:  TYLENOL Take 2 tablets (1,000 mg total) by mouth every 8 (eight) hours as needed. What changed:  when to take this   acyclovir 200 MG capsule Commonly known as:  ZOVIRAX TAKE TWO CAPSULES BY MOUTH THREE TIMES A DAY FOR 5 DAYS AS NEEDED FOR OUTBREAK   aspirin 325 MG EC tablet Take 1 tablet (325 mg total) by mouth 2 (two) times daily. What changed:  Another medication with the same name was removed. Continue taking this medication, and follow the directions you see here.   B-complex with vitamin C tablet Take 1 tablet by mouth daily.   diphenhydrAMINE 25 mg capsule Commonly known as:  BENADRYL Take 50 mg by mouth at bedtime.   DULoxetine 60 MG capsule Commonly known as:  CYMBALTA TAKE ONE CAPSULE BY MOUTH TWICE A DAY **MUST CALL MD FOR APPOINTMENT   gabapentin 800 MG tablet Commonly known as:  NEURONTIN Take 1 tablet (800 mg total) by mouth 4 (four) times daily. Office visit needed   HYDROmorphone 2 MG tablet Commonly known as:  DILAUDID Take 1 tablet (2 mg total) by mouth every 4 (four) hours as needed (breakthrough post-op pain).   insulin aspart 100 UNIT/ML FlexPen Commonly known as:  NOVOLOG FLEXPEN Inject 8 Units into the skin 3 (three) times daily with meals.   Insulin Glargine 100 UNIT/ML Solostar Pen Commonly known as:  LANTUS Inject 25 Units into the skin daily at 10 pm.   Insulin Pen Needle 32G X 8 MM Misc Use as directed   lisinopril-hydrochlorothiazide 20-25 MG tablet Commonly known as:  PRINZIDE,ZESTORETIC TAKE ONE TABLET BY MOUTH DAILY   metFORMIN 500 MG tablet Commonly known as:  GLUCOPHAGE Take 1 tablet (500 mg total) by mouth 2 (two) times daily with a meal. What changed:     how much to take  when to take this  methocarbamol 500 MG tablet Commonly known as:  ROBAXIN Take 1-2 tablets (500-1,000 mg total) by mouth 4 (four) times daily. What changed:    when to take this  reasons to take this   multivitamin tablet Take 1 tablet by mouth daily.   NICOTINE TD Place 1 patch onto the skin daily.   Oxycodone HCl 10 MG Tabs Take 1 tablet (10 mg total) by mouth every 4 (four) hours as needed for moderate pain or severe pain (pain score 7-10). What changed:  reasons to take this   traZODone 100 MG tablet Commonly known as:  DESYREL TAKE ONE TABLET BY MOUTH THREE TIMES A DAY -- NEED OFFICE VISIT What changed:  See the new instructions.   varenicline 1 MG tablet Commonly known as:  CHANTIX Take 1 mg by mouth 2 (two) times daily.            Durable Medical Equipment  (From admission, onward)        Start     Ordered   08/22/17 1717  DME Walker rolling  Once    Question:  Patient needs a walker to treat with the following condition  Answer:  S/P revision of total knee   08/22/17 1717   08/22/17 1717  DME 3 n 1  Once     08/22/17 1717   08/22/17 1717  DME Bedside commode  Once    Question:  Patient needs a bedside commode to treat with the following condition  Answer:  S/P revision of total knee   08/22/17 1717      FOLLOW UP VISIT:   Follow-up Information    [facility] Follow up.   Specialty:  Home Health Services Why:  A representative from [facility] will contact you to arrange start date and time for your therapy. Contact information: [address] [place] [address] [place] [address] [phone]           DISPOSITION: HOME VS. SNF  CONDITION:  Good   Guy Sandifer 08/23/2017, 12:48 PM

## 2017-08-23 NOTE — Plan of Care (Signed)

## 2017-08-23 NOTE — Progress Notes (Signed)
Physical Therapy Treatment Patient Details Name: Brittney Sawyer MRN: [id number] DOB: Jan 16, 1966 Today's Date: 08/23/2017    History of Present Illness Pt is a 52 y/o female with a PMH of R exicision of TKA with antibiotic spacer placement, HTN, R TKA, R third toe amputation, back surgery, DM, and smoker. Pt is now s/p R total knee revision on 08/22/17.     PT Comments    Pt progressing well towards physical therapy goals. Pt was educated on car transfer, positioning/precautions, HEP, and general safety with mobility. Pt completed stair training this session at a light min guard level. Pt anticipates d/c home this afternoon. Will continue to follow and progress as able per POC.    Follow Up Recommendations  Follow surgeon's recommendation for DC plan and follow-up therapies     Equipment Recommendations  3in1 (PT)    Recommendations for Other Services       Precautions / Restrictions Precautions Precautions: Fall;Knee Precaution Comments: Pt was educated on proper positioning - NO pillow, roll, ice pack under knee, only under HEEL.  Restrictions Weight Bearing Restrictions: Yes RLE Weight Bearing: Weight bearing as tolerated    Mobility  Bed Mobility               General bed mobility comments: Pt sitting up in recliner upon PT arrival. Pt states she plans to sleep in the recliner at home.   Transfers Overall transfer level: Modified independent Equipment used: Rolling walker (2 wheeled) Transfers: Sit to/from Stand           General transfer comment: Pt was able to power-up to full stand without assistance and without any unsteadiness noted.  Ambulation/Gait Ambulation/Gait assistance: Supervision Ambulation Distance (Feet): 300 Feet Assistive device: Rolling walker (2 wheeled) Gait Pattern/deviations: Step-through pattern;Decreased stride length;Decreased dorsiflexion - right;Trunk flexed Gait velocity: Decreased Gait velocity interpretation: <1.31 ft/sec,  indicative of household ambulator General Gait Details: VC's for sequencing and general safety with the RW. Pt was also cued for improved posture, increased heel strike, and fluidity of walker movement.    Stairs Stairs: Yes Stairs assistance: Min guard Stair Management: One rail Right;Two rails;Step to pattern Number of Stairs: 5 General stair comments: Pt first attempted stair negotiation sideways. Was able to complete well, but encouraged her to try facing forwards. She was able to complete forward facing stair negotiation with greater ease and no physical assistance.    Wheelchair Mobility    Modified Rankin (Stroke Patients Only)       Balance Overall balance assessment: Mild deficits observed, not formally tested                                          Cognition Arousal/Alertness: Awake/alert Behavior During Therapy: WFL for tasks assessed/performed Overall Cognitive Status: Within Functional Limits for tasks assessed                                        Exercises      General Comments        Pertinent Vitals/Pain Pain Assessment: Faces Faces Pain Scale: Hurts little more Pain Location: R knee Pain Descriptors / Indicators: Operative site guarding;Aching Pain Intervention(s): Monitored during session    Home Living  Prior Function            PT Goals (current goals can now be found in the care plan section) Acute Rehab PT Goals Patient Stated Goal: Home today PT Goal Formulation: With patient Time For Goal Achievement: 08/30/17 Potential to Achieve Goals: Good Progress towards PT goals: Progressing toward goals    Frequency    7X/week      PT Plan Current plan remains appropriate    Co-evaluation              AM-PAC PT "6 Clicks" Daily Activity  Outcome Measure  Difficulty turning over in bed (including adjusting bedclothes, sheets and blankets)?: None Difficulty  moving from lying on back to sitting on the side of the bed? : A Little Difficulty sitting down on and standing up from a chair with arms (e.g., wheelchair, bedside commode, etc,.)?: A Little Help needed moving to and from a bed to chair (including a wheelchair)?: A Little Help needed walking in hospital room?: A Little Help needed climbing 3-5 steps with a railing? : A Little 6 Click Score: 19    End of Session Equipment Utilized During Treatment: Gait belt Activity Tolerance: Patient tolerated treatment well Patient left: in chair;with call bell/phone within reach;with family/visitor present Nurse Communication: Mobility status PT Visit Diagnosis: Unsteadiness on feet (R26.81);Pain Pain - Right/Left: Right Pain - part of body: Knee     Time: 6962-9528 PT Time Calculation (min) (ACUTE ONLY): 27 min  Charges:  $Gait Training: 23-37 mins                    G Codes:       Conni Slipper, PT, DPT Acute Rehabilitation Services Pager: [phone]    KAISA WOFFORD 08/23/2017, 1:36 PM

## 2017-08-23 NOTE — Evaluation (Signed)
Physical Therapy Evaluation Patient Details Name: Brittney Sawyer MRN: [id number] DOB: Apr 27, 1966 Today's Date: 08/23/2017   History of Present Illness  Pt is a 52 y/o female with a PMH of R exicision of TKA with antibiotic spacer placement, HTN, R TKA, R third toe amputation, back surgery, DM, and smoker. Pt is now s/p R total knee revision on 08/22/17.   Clinical Impression  Pt admitted with above diagnosis. Pt currently with functional limitations due to the deficits listed below (see PT Problem List). At the time of PT eval pt was able to perform transfers and ambulation with modified independence to supervision for safety. Will see for a second visit for stair training prior to d/c. Pt anticipates d/c home today. Pt will benefit from skilled PT to increase their independence and safety with mobility to allow discharge to the venue listed below.       Follow Up Recommendations Follow surgeon's recommendation for DC plan and follow-up therapies    Equipment Recommendations  3in1 (PT)    Recommendations for Other Services       Precautions / Restrictions Precautions Precautions: Fall;Knee Precaution Comments: Pt was educated on proper positioning - NO pillow, roll, ice pack under knee, only under HEEL.  Restrictions Weight Bearing Restrictions: Yes RLE Weight Bearing: Weight bearing as tolerated      Mobility  Bed Mobility               General bed mobility comments: Pt sitting up in recliner upon PT arrival. Pt states she slept in the recliner last night.   Transfers Overall transfer level: Modified independent Equipment used: Rolling walker (2 wheeled) Transfers: Sit to/from Stand           General transfer comment: Pt was able to power-up to full stand without assistance and without any unsteadiness noted.  Ambulation/Gait Ambulation/Gait assistance: Supervision Ambulation Distance (Feet): 175 Feet Assistive device: Rolling walker (2 wheeled) Gait  Pattern/deviations: Step-through pattern;Decreased stride length;Decreased dorsiflexion - right;Trunk flexed Gait velocity: Decreased Gait velocity interpretation: <1.31 ft/sec, indicative of household ambulator General Gait Details: VC's for sequencing and general safety with the RW. Pt was also cued for improved posture, increased heel strike, and fluidity of walker movement.   Stairs            Wheelchair Mobility    Modified Rankin (Stroke Patients Only)       Balance Overall balance assessment: Mild deficits observed, not formally tested                                           Pertinent Vitals/Pain Pain Assessment: Faces Faces Pain Scale: Hurts little more Pain Location: R knee Pain Descriptors / Indicators: Operative site guarding;Aching Pain Intervention(s): Limited activity within patient's tolerance;Monitored during session;Repositioned    Home Living Family/patient expects to be discharged to:: Private residence Living Arrangements: Spouse/significant other Available Help at Discharge: Family;Available 24 hours/day Type of Home: House Home Access: Stairs to enter Entrance Stairs-Rails: Doctor, general practice of Steps: 5 Home Layout: Two level;Able to live on main level with bedroom/bathroom Home Equipment: Dan Humphreys - 2 wheels Additional Comments: needs 3:1    Prior Function Level of Independence: Independent               Hand Dominance   Dominant Hand: Right    Extremity/Trunk Assessment   Upper Extremity Assessment Upper Extremity Assessment: Defer  to OT evaluation;Overall WFL for tasks assessed    Lower Extremity Assessment Lower Extremity Assessment: RLE deficits/detail RLE Deficits / Details: Decreased strength and AROM consistent with above mentioned procedure.  RLE: Unable to fully assess due to pain    Cervical / Trunk Assessment Cervical / Trunk Assessment: Normal  Communication   Communication: No  difficulties  Cognition Arousal/Alertness: Awake/alert Behavior During Therapy: WFL for tasks assessed/performed Overall Cognitive Status: Within Functional Limits for tasks assessed                                        General Comments      Exercises Total Joint Exercises Heel Slides: 10 reps Long Arc Quad: 10 reps   Assessment/Plan    PT Assessment Patient needs continued PT services  PT Problem List Decreased strength;Decreased range of motion;Decreased activity tolerance;Decreased balance;Decreased mobility;Decreased knowledge of use of DME;Decreased safety awareness;Decreased knowledge of precautions;Pain       PT Treatment Interventions Stair training;DME instruction;Gait training;Functional mobility training;Therapeutic activities;Therapeutic exercise;Neuromuscular re-education;Patient/family education    PT Goals (Current goals can be found in the Care Plan section)  Acute Rehab PT Goals Patient Stated Goal: Home today PT Goal Formulation: With patient/family Time For Goal Achievement: 08/30/17 Potential to Achieve Goals: Good    Frequency 7X/week   Barriers to discharge        Co-evaluation               AM-PAC PT "6 Clicks" Daily Activity  Outcome Measure Difficulty turning over in bed (including adjusting bedclothes, sheets and blankets)?: None Difficulty moving from lying on back to sitting on the side of the bed? : A Little Difficulty sitting down on and standing up from a chair with arms (e.g., wheelchair, bedside commode, etc,.)?: A Little Help needed moving to and from a bed to chair (including a wheelchair)?: A Little Help needed walking in hospital room?: A Little Help needed climbing 3-5 steps with a railing? : A Little 6 Click Score: 19    End of Session Equipment Utilized During Treatment: Gait belt Activity Tolerance: Patient tolerated treatment well Patient left: in chair;with call bell/phone within reach;with  family/visitor present Nurse Communication: Mobility status PT Visit Diagnosis: Unsteadiness on feet (R26.81);Pain Pain - Right/Left: Right Pain - part of body: Knee    Time: 2993-7169 PT Time Calculation (min) (ACUTE ONLY): 33 min   Charges:   PT Evaluation $PT Eval Moderate Complexity: 1 Mod PT Treatments $Gait Training: 8-22 mins   PT G Codes:        Conni Slipper, PT, DPT Acute Rehabilitation Services Pager: [phone]   Brittney Sawyer 08/23/2017, 9:42 AM

## 2017-08-23 NOTE — Progress Notes (Signed)
Orthopedic Tech Progress Note Patient Details:  [name] [birth year] [id number]  Patient ID: Brittney Sawyer, female   DOB: [birth year], 52 y.o.   MRN: [id number] Pt will call when ready for cpm.  Trinna Post 08/23/2017, 5:53 AM

## 2017-08-23 NOTE — Care Management Note (Addendum)
Case Management Note  Patient Details  Name: Brittney Sawyer MRN: [id number] Date of Birth: 11/21/1965  Subjective/Objective:  52 yr old female s/p right total knee revision with removal of spacer.                   Action/Plan: Case manager spke with patient concerning discharge plan and DME. Patient was preoperatively setup with [facility], no changes. She will have family support at discharge. Patient will pick up 3in1 on her own, it isnt covered by insurance.    Expected Discharge Date:   08/23/17               Expected Discharge Plan:  Home w Home Health Services  In-House Referral:  NA  Discharge planning Services  CM Consult  Post Acute Care Choice:  Durable Medical Equipment, Home Health Choice offered to:  Patient  DME Arranged:  CPM, Youth walker DME Agency:  [employer]  HH Arranged:  PT HH Agency:  [facility] (formerly [facility])  Status of Service:  Completed, signed off  If discussed at [employer], dates discussed:    Additional Comments:  Durenda Guthrie, RN 08/23/2017, 11:39 AM

## 2017-08-23 NOTE — Progress Notes (Signed)
SPORTS MEDICINE AND JOINT REPLACEMENT  Georgena Spurling, MD    Laurier Nancy, PA-C [address] [place], [place], [place]  [address]                             [phone]   PROGRESS NOTE  Subjective:  negative for Chest Pain  negative for Shortness of Breath  negative for Nausea/Vomiting   negative for Calf Pain  negative for Bowel Movement   Tolerating Diet: yes         Patient reports pain as 3 on 0-10 scale.    Objective: Vital signs in last 24 hours:    Patient Vitals for the past 24 hrs:  BP Temp Temp src Pulse Resp SpO2 Height Weight  08/23/17 0453 (!) 96/59 (!) 97.5 F (36.4 C) Oral 75 - 99 % - -  08/22/17 1938 95/60 98.5 F (36.9 C) Oral 94 - 97 % - -  08/22/17 1645 109/66 98.1 F (36.7 C) - 82 12 98 % - -  08/22/17 1615 110/69 - - - 13 99 % - -  08/22/17 1600 113/70 - - 83 13 99 % - -  08/22/17 1530 (!) 115/59 98.5 F (36.9 C) - 79 16 96 % - -  08/22/17 0955 (!) 94/47 - - 74 14 100 % - -  08/22/17 0950 (!) 96/47 - - 80 12 100 % - -  08/22/17 0945 - - - 80 12 100 % - -  08/22/17 0940 104/78 - - 85 16 100 % - -  08/22/17 0835 (!) 115/51 98.7 F (37.1 C) Oral 80 18 99 % 5' (1.524 m) 82.6 kg (182 lb)    @flow {1959:LAST@   Intake/Output from previous day:   04/15 0701 - 04/16 0700 In: 1700 [I.V.:1700] Out: 1230 [Urine:1200]   Intake/Output this shift:   No intake/output data recorded.   Intake/Output      04/15 0701 - 04/16 0700 04/16 0701 - 04/17 0700   I.V. (mL/kg) 1700 (20.6)    Total Intake(mL/kg) 1700 (20.6)    Urine (mL/kg/hr) 1200    Blood 30    Total Output 1230    Net +470         Urine Occurrence 1 x       LABORATORY DATA: Recent Labs    08/16/17 1511 08/23/17 0624  WBC 10.3 11.1*  HGB 12.2 9.7*  HCT 38.0 30.3*  PLT 311 266   Recent Labs    08/16/17 1511  NA 137  K 4.2  CL 101  CO2 23  BUN 21*  CREATININE 1.01*  GLUCOSE 157*  CALCIUM 9.4   Lab Results  Component Value Date   INR 0.98 01/26/2013   INR 0.93 01/04/2013    INR 1.0 12/09/2007    Examination:  General appearance: alert, cooperative and no distress Extremities: extremities normal, atraumatic, no cyanosis or edema  Wound Exam: clean, dry, intact   Drainage:  None: wound tissue dry  Motor Exam: Quadriceps and Hamstrings Intact  Sensory Exam: Superficial Peroneal, Deep Peroneal and Tibial normal   Assessment:    1 Day Post-Op  Procedure(s) (LRB): RIGHT TOTAL KNEE REVISION WUITH REMOVAL OF ANTIBIOTIC SPACER (Right)  ADDITIONAL DIAGNOSIS:  Active Problems:   S/P revision of total knee     Plan: Physical Therapy as ordered Weight Bearing as Tolerated (WBAT)  DVT Prophylaxis:  Aspirin  DISCHARGE PLAN: Home  DISCHARGE NEEDS: HHPT   Patient doing  well, expected D/C home today   Patient's anticipated LOS is less than 2 midnights, meeting these requirements: - Younger than 33 - Lives within 1 hour of care - Has a competent adult at home to recover with post-op recover - NO history of  - Chronic pain requiring opiods  - Coronary Artery Disease  - Heart failure  - Heart attack  - Stroke  - DVT/VTE  - Cardiac arrhythmia  - Respiratory Failure/COPD  - Renal failure  - Anemia  - Advanced Liver disease              Guy Sandifer 08/23/2017, 7:06 AM

## 2017-08-23 NOTE — Anesthesia Postprocedure Evaluation (Signed)
Anesthesia Post Note  Patient: Brittney Sawyer  Procedure(s) Performed: RIGHT TOTAL KNEE REVISION WUITH REMOVAL OF ANTIBIOTIC SPACER (Right Knee)     Patient location during evaluation: PACU Anesthesia Type: General Level of consciousness: awake and alert Pain management: pain level controlled Vital Signs Assessment: post-procedure vital signs reviewed and stable Respiratory status: spontaneous breathing, nonlabored ventilation, respiratory function stable and patient connected to nasal cannula oxygen Cardiovascular status: blood pressure returned to baseline and stable Postop Assessment: no apparent nausea or vomiting Anesthetic complications: no    Last Vitals:  Vitals:   08/22/17 1938 08/23/17 0453  BP: 95/60 (!) 96/59  Pulse: 94 75  Resp:    Temp: 36.9 C (!) 36.4 C  SpO2: 97% 99%    Last Pain:  Vitals:   08/23/17 0600  TempSrc:   PainSc: 2                  Jnya Brossard

## 2017-08-25 ENCOUNTER — Encounter (HOSPITAL_COMMUNITY): Payer: Self-pay | Admitting: Orthopedic Surgery

## 2017-08-25 NOTE — Op Note (Signed)
TOTAL KNEE REPLACEMENT OPERATIVE NOTE:  08/22/2017  11:26 AM  PATIENT:  Brittney Sawyer  52 y.o. female  PRE-OPERATIVE DIAGNOSIS:  Status post removal right total knee arthroplasty with antibiotic spacer  POST-OPERATIVE DIAGNOSIS:  Status post removal right total knee arthroplasty with antibiotic   PROCEDURE:  Procedure(s): RIGHT TOTAL KNEE REVISION WUITH REMOVAL OF ANTIBIOTIC SPACER  SURGEON:  Surgeon(s): Dannielle Huh, MD  PHYSICIAN ASSISTANT: Skip Mayer, [facility]  ANESTHESIA:   spinal  DRAINS: Hemovac  SPECIMEN: None  COUNTS:  Correct  TOURNIQUET:   Total Tourniquet Time Documented: Thigh (Right) - 90 minutes Total: Thigh (Right) - 90 minutes   DICTATION:  Indication for procedure:    The patient is a 52 y.o. female who has failed conservative treatment for Status post removal right total knee arthroplasty with antibiotic spacer.  Informed consent was obtained prior to anesthesia. The risks versus benefits of the operation were explain and in a way the patient can, and did, understand.   On the implant demand matching protocol, this patient scored 10.  Therefore, this patient was not receive a polyethylene insert with vitamin E which is a high demand implant.  Description of procedure:     The patient was taken to the operating room and placed under anesthesia.  The patient was positioned in the usual fashion taking care that all body parts were adequately padded and/or protected.  I foley catheter was not placed.  A tourniquet was applied and the leg prepped and draped in the usual sterile fashion.  The extremity was exsanguinated with the esmarch and tourniquet inflated to 350 mmHg.  Pre-operative range of motion was 0-45 degrees flexion.  The knee was in 5 degree of mild varus.  A midline incision approximately 6-7 inches long was made with a #10 blade.  A new blade was used to make a parapatellar arthrotomy going 2-3 cm into the quadriceps tendon, over the patella, and  alongside the medial aspect of the patellar tendon.  A synovectomy was then performed with the #10 blade and forceps. I then elevated the deep MCL off the medial tibial metaphysis subperiosteally around to the semimembranosus attachment.    I removed the antibiotic cement spacer with the osteotome and mallet.  I then cleaned the bone surfaces with a currette and rongeurs.  A homan retractor was place to retract and protect the patella and lateral structures.  A Z-retractor was place medially to protect the medial structures. I reamed both the femoral and tibial canal the then used the intra-medullary alignment system was used to make cut the tibial articular surface perpendicular to the anamotic axis of the tibia and in 3 degrees of posterior slope.  The cut surface and alignment jig was removed.    I then marked out the epicondylar axis on the distal femur. I recreated the femur with a D femur, two posterior 76mm augments and a 49mm stem.  I recreated and prepared the tibia with a size 2 tibial component, no augments and a stubby stem. I achieved balance with a 12mm CCK poly.   I had excellent flexion/extension gap balance, excellent patella tracking.  Flexion was full and beyond 120 degrees; extension was zero.  These components were chosen and the staff opened them to me on the back table while the knee was lavaged copiously and the cement mixed.  The soft tissue was infiltrated with 60cc of exparel 1.3% through a 21 gauge needle.  I cemented in the components and removed all excess  cement.  The polyethylene tibial component was snapped into place and the knee placed in extension while cement was hardening.  The capsule was infilltrated with 30cc of .25% Marcaine with epinephrine.  A hemovac was place in the joint exiting superolaterally.  A pain pump was place superomedially superficial to the arthrotomy.  Once the cement was hard, the tourniquet was let down.  Hemostasis was obtained.  The arthrotomy  was closed with figure-8 #1 vicryl sutures.  The deep soft tissues were closed with #0 vicryls and the subcuticular layer closed with a running #2-0 vicryl.  The skin was reapproximated and closed with skin staples.  The wound was dressed with xeroform, 4 x4's, 2 ABD sponges, a single layer of webril and a TED stocking.   The patient was then awakened, extubated, and taken to the recovery room in stable condition.  BLOOD LOSS:  300cc DRAINS: 1 hemovac, 1 pain catheter COMPLICATIONS:  None.  PLAN OF CARE: Admit for overnight observation  PATIENT DISPOSITION:  PACU - hemodynamically stable.   Delay start of Pharmacological VTE agent (>24hrs) due to surgical blood loss or risk of bleeding:  not applicable  Please fax a copy of this op note to my office at [phone] (please only include page 1 and 2 of the Case Information op note)

## 2017-08-29 ENCOUNTER — Other Ambulatory Visit: Payer: Self-pay | Admitting: Family Medicine

## 2017-08-29 DIAGNOSIS — E119 Type 2 diabetes mellitus without complications: Secondary | ICD-10-CM

## 2017-08-31 ENCOUNTER — Telehealth: Payer: Self-pay

## 2017-08-31 NOTE — Telephone Encounter (Signed)
If the surgeon is confident the infection is eradicated then why would I have her on oral antibiotics?  If the infection is NOT eradicated then WHY do surgery?

## 2017-08-31 NOTE — Telephone Encounter (Signed)
Patient called and stated that she had a total knee replacement on 08/22/2017 and that the surgeon told her to contact Dr. Algis Liming to see if she needed to be on oral antibiotics or not and if so what. Please advise.  Towanda Octave, LPN

## 2017-09-01 ENCOUNTER — Telehealth: Payer: Self-pay

## 2017-09-01 NOTE — Telephone Encounter (Signed)
Called patient to follow up on call from patient phone call yesterday. Per Dr. Algis Liming if the surgeon didn't feel the need for antibiotics then she doesn't need them. Towanda Octave, LPN

## 2017-09-01 NOTE — Telephone Encounter (Signed)
Patient returned call, RN relayed advice. She will call if there are any issues. Andree Coss, RN

## 2017-09-18 LAB — BLOOD GAS, VENOUS
ACID-BASE DEFICIT: 3.6 mmol/L — AB (ref 0.0–2.0)
BICARBONATE: 21 mmol/L (ref 20.0–28.0)
O2 SAT: 78.5 %
PATIENT TEMPERATURE: 37
PO2 VEN: 45.7 mmHg — AB (ref 32.0–45.0)
pCO2, Ven: 39 mmHg — ABNORMAL LOW (ref 44.0–60.0)
pH, Ven: 7.351 (ref 7.250–7.430)

## 2017-10-03 ENCOUNTER — Encounter: Payer: Self-pay | Admitting: Family Medicine

## 2018-08-24 ENCOUNTER — Other Ambulatory Visit: Payer: Self-pay

## 2018-08-24 ENCOUNTER — Encounter (HOSPITAL_COMMUNITY): Payer: Self-pay | Admitting: Emergency Medicine

## 2018-08-24 ENCOUNTER — Emergency Department (HOSPITAL_COMMUNITY): Payer: Commercial Managed Care - PPO

## 2018-08-24 ENCOUNTER — Inpatient Hospital Stay (HOSPITAL_COMMUNITY)
Admission: EM | Admit: 2018-08-24 | Discharge: 2018-08-26 | DRG: 224 | Disposition: A | Discharge status: Home / Self Care | Payer: Commercial Managed Care - PPO | Attending: Internal Medicine | Admitting: Internal Medicine

## 2018-08-24 DIAGNOSIS — Z90710 Acquired absence of both cervix and uterus: Secondary | ICD-10-CM | POA: Diagnosis not present

## 2018-08-24 DIAGNOSIS — G8929 Other chronic pain: Secondary | ICD-10-CM | POA: Diagnosis present

## 2018-08-24 DIAGNOSIS — Z8249 Family history of ischemic heart disease and other diseases of the circulatory system: Secondary | ICD-10-CM | POA: Diagnosis not present

## 2018-08-24 DIAGNOSIS — Z96651 Presence of right artificial knee joint: Secondary | ICD-10-CM | POA: Diagnosis present

## 2018-08-24 DIAGNOSIS — I428 Other cardiomyopathies: Secondary | ICD-10-CM | POA: Diagnosis present

## 2018-08-24 DIAGNOSIS — F419 Anxiety disorder, unspecified: Secondary | ICD-10-CM | POA: Diagnosis present

## 2018-08-24 DIAGNOSIS — F17210 Nicotine dependence, cigarettes, uncomplicated: Secondary | ICD-10-CM | POA: Diagnosis present

## 2018-08-24 DIAGNOSIS — N179 Acute kidney failure, unspecified: Secondary | ICD-10-CM | POA: Diagnosis present

## 2018-08-24 DIAGNOSIS — E872 Acidosis: Secondary | ICD-10-CM | POA: Diagnosis present

## 2018-08-24 DIAGNOSIS — I499 Cardiac arrhythmia, unspecified: Secondary | ICD-10-CM

## 2018-08-24 DIAGNOSIS — I4892 Unspecified atrial flutter: Secondary | ICD-10-CM | POA: Diagnosis present

## 2018-08-24 DIAGNOSIS — Z7982 Long term (current) use of aspirin: Secondary | ICD-10-CM

## 2018-08-24 DIAGNOSIS — K219 Gastro-esophageal reflux disease without esophagitis: Secondary | ICD-10-CM | POA: Diagnosis present

## 2018-08-24 DIAGNOSIS — I5021 Acute systolic (congestive) heart failure: Secondary | ICD-10-CM | POA: Diagnosis present

## 2018-08-24 DIAGNOSIS — Z794 Long term (current) use of insulin: Secondary | ICD-10-CM | POA: Diagnosis not present

## 2018-08-24 DIAGNOSIS — I9589 Other hypotension: Secondary | ICD-10-CM

## 2018-08-24 DIAGNOSIS — E1140 Type 2 diabetes mellitus with diabetic neuropathy, unspecified: Secondary | ICD-10-CM | POA: Diagnosis present

## 2018-08-24 DIAGNOSIS — Z79899 Other long term (current) drug therapy: Secondary | ICD-10-CM | POA: Diagnosis not present

## 2018-08-24 DIAGNOSIS — I351 Nonrheumatic aortic (valve) insufficiency: Secondary | ICD-10-CM | POA: Diagnosis not present

## 2018-08-24 DIAGNOSIS — I110 Hypertensive heart disease with heart failure: Secondary | ICD-10-CM | POA: Diagnosis present

## 2018-08-24 DIAGNOSIS — R55 Syncope and collapse: Secondary | ICD-10-CM

## 2018-08-24 DIAGNOSIS — Z95810 Presence of automatic (implantable) cardiac defibrillator: Secondary | ICD-10-CM

## 2018-08-24 DIAGNOSIS — I480 Paroxysmal atrial fibrillation: Secondary | ICD-10-CM | POA: Diagnosis present

## 2018-08-24 DIAGNOSIS — D869 Sarcoidosis, unspecified: Secondary | ICD-10-CM | POA: Diagnosis present

## 2018-08-24 DIAGNOSIS — I472 Ventricular tachycardia, unspecified: Secondary | ICD-10-CM

## 2018-08-24 DIAGNOSIS — E785 Hyperlipidemia, unspecified: Secondary | ICD-10-CM | POA: Diagnosis present

## 2018-08-24 LAB — COMPREHENSIVE METABOLIC PANEL
ALT: 30 U/L (ref 0–44)
AST: 32 U/L (ref 15–41)
Albumin: 3.9 g/dL (ref 3.5–5.0)
Alkaline Phosphatase: 69 U/L (ref 38–126)
Anion gap: 14 (ref 5–15)
BUN: 21 mg/dL — ABNORMAL HIGH (ref 6–20)
CO2: 19 mmol/L — ABNORMAL LOW (ref 22–32)
Calcium: 8.8 mg/dL — ABNORMAL LOW (ref 8.9–10.3)
Chloride: 106 mmol/L (ref 98–111)
Creatinine, Ser: 1.25 mg/dL — ABNORMAL HIGH (ref 0.44–1.00)
GFR calc Af Amer: 57 mL/min — ABNORMAL LOW (ref >60)
GFR calc non Af Amer: 49 mL/min — ABNORMAL LOW (ref >60)
Glucose, Bld: 119 mg/dL — ABNORMAL HIGH (ref 70–99)
Potassium: 3.9 mmol/L (ref 3.5–5.1)
Sodium: 139 mmol/L (ref 135–145)
Total Bilirubin: 0.4 mg/dL (ref 0.3–1.2)
Total Protein: 6.2 g/dL — ABNORMAL LOW (ref 6.5–8.1)

## 2018-08-24 LAB — CBC WITH DIFFERENTIAL/PLATELET
Abs Immature Granulocytes: 0.05 10*3/uL (ref 0.00–0.07)
Basophils Absolute: 0.1 10*3/uL (ref 0.0–0.1)
Basophils Relative: 1 %
Eosinophils Absolute: 0.2 10*3/uL (ref 0.0–0.5)
Eosinophils Relative: 1 %
HCT: 37.2 % (ref 36.0–46.0)
Hemoglobin: 11.9 g/dL — ABNORMAL LOW (ref 12.0–15.0)
Immature Granulocytes: 0 %
Lymphocytes Relative: 27 %
Lymphs Abs: 3.8 10*3/uL (ref 0.7–4.0)
MCH: 33.2 pg (ref 26.0–34.0)
MCHC: 32 g/dL (ref 30.0–36.0)
MCV: 103.9 fL — ABNORMAL HIGH (ref 80.0–100.0)
Monocytes Absolute: 1 10*3/uL (ref 0.1–1.0)
Monocytes Relative: 7 %
Neutro Abs: 8.8 10*3/uL — ABNORMAL HIGH (ref 1.7–7.7)
Neutrophils Relative %: 64 %
Platelets: 324 10*3/uL (ref 150–400)
RBC: 3.58 MIL/uL — ABNORMAL LOW (ref 3.87–5.11)
RDW: 12.4 % (ref 11.5–15.5)
WBC: 13.9 10*3/uL — ABNORMAL HIGH (ref 4.0–10.5)
nRBC: 0 % (ref 0.0–0.2)

## 2018-08-24 LAB — ETHANOL: Alcohol, Ethyl (B): <10 mg/dL (ref <10)

## 2018-08-24 LAB — POCT I-STAT EG7
Acid-base deficit: 2 mmol/L (ref 0.0–2.0)
Bicarbonate: 22.9 mmol/L (ref 20.0–28.0)
Calcium, Ion: 1.06 mmol/L — ABNORMAL LOW (ref 1.15–1.40)
HCT: 37 % (ref 36.0–46.0)
Hemoglobin: 12.6 g/dL (ref 12.0–15.0)
O2 Saturation: 31 %
Potassium: 3.8 mmol/L (ref 3.5–5.1)
Sodium: 140 mmol/L (ref 135–145)
TCO2: 24 mmol/L (ref 22–32)
pCO2, Ven: 40.8 mmHg — ABNORMAL LOW (ref 44.0–60.0)
pH, Ven: 7.358 (ref 7.250–7.430)
pO2, Ven: 20 mmHg — CL (ref 32.0–45.0)

## 2018-08-24 LAB — TROPONIN I: Troponin I: <0.03 ng/mL (ref <0.03)

## 2018-08-24 LAB — I-STAT CREATININE, ED: Creatinine, Ser: 1.2 mg/dL — ABNORMAL HIGH (ref 0.44–1.00)

## 2018-08-24 LAB — SALICYLATE LEVEL: Salicylate Lvl: <7 mg/dL (ref 2.8–30.0)

## 2018-08-24 LAB — ACETAMINOPHEN LEVEL: Acetaminophen (Tylenol), Serum: 10 ug/mL (ref 10–30)

## 2018-08-24 MED ORDER — VARENICLINE TARTRATE 1 MG PO TABS
1.0000 mg | ORAL_TABLET | Freq: Two times a day (BID) | ORAL | Status: DC
  Administered 2018-08-25 – 2018-08-26 (×4): 1 mg via ORAL
  Filled 2018-08-24 (×5): qty 1

## 2018-08-24 MED ORDER — LACTATED RINGERS IV SOLN
INTRAVENOUS | Status: AC
  Administered 2018-08-25: 01:00:00 via INTRAVENOUS

## 2018-08-24 MED ORDER — SODIUM BICARBONATE 8.4 % IV SOLN
50.0000 meq | Freq: Once | INTRAVENOUS | Status: AC
  Administered 2018-08-24: 50 meq via INTRAVENOUS
  Filled 2018-08-24: qty 50

## 2018-08-24 MED ORDER — GABAPENTIN 400 MG PO CAPS
800.0000 mg | ORAL_CAPSULE | Freq: Three times a day (TID) | ORAL | Status: DC
  Administered 2018-08-25 – 2018-08-26 (×4): 800 mg via ORAL
  Filled 2018-08-24 (×4): qty 2

## 2018-08-24 MED ORDER — MAGNESIUM SULFATE 2 GM/50ML IV SOLN
2.0000 g | Freq: Once | INTRAVENOUS | Status: AC
  Administered 2018-08-24: 2 g via INTRAVENOUS
  Filled 2018-08-24: qty 50

## 2018-08-24 MED ORDER — DULOXETINE HCL 60 MG PO CPEP
60.0000 mg | ORAL_CAPSULE | Freq: Two times a day (BID) | ORAL | Status: DC
  Administered 2018-08-25 – 2018-08-26 (×4): 60 mg via ORAL
  Filled 2018-08-24 (×4): qty 1

## 2018-08-24 MED ORDER — SODIUM CHLORIDE 0.9 % IV SOLN
250.0000 mL | INTRAVENOUS | Status: DC | PRN
  Administered 2018-08-25 – 2018-08-26 (×2): 250 mL via INTRAVENOUS

## 2018-08-24 MED ORDER — INSULIN ASPART 100 UNIT/ML ~~LOC~~ SOLN: 0.0000 [IU] | Freq: Every day | SUBCUTANEOUS | Status: DC

## 2018-08-24 MED ORDER — HEPARIN SODIUM (PORCINE) 5000 UNIT/ML IJ SOLN
5000.0000 [IU] | Freq: Three times a day (TID) | INTRAMUSCULAR | Status: DC
  Administered 2018-08-25: 5000 [IU] via SUBCUTANEOUS
  Filled 2018-08-24: qty 1

## 2018-08-24 MED ORDER — ACETAMINOPHEN 500 MG PO TABS: 500.0000 mg | ORAL_TABLET | Freq: Three times a day (TID) | ORAL | Status: DC | PRN

## 2018-08-24 MED ORDER — INSULIN GLARGINE 100 UNIT/ML SOLOSTAR PEN: 25.0000 [IU] | PEN_INJECTOR | Freq: Every day | SUBCUTANEOUS | Status: DC

## 2018-08-24 MED ORDER — SODIUM CHLORIDE 0.9% FLUSH
3.0000 mL | Freq: Two times a day (BID) | INTRAVENOUS | Status: DC
  Administered 2018-08-25 (×2): 3 mL via INTRAVENOUS

## 2018-08-24 MED ORDER — SODIUM CHLORIDE 0.9 % IV BOLUS
1000.0000 mL | Freq: Once | INTRAVENOUS | Status: AC
  Administered 2018-08-24: 1000 mL via INTRAVENOUS

## 2018-08-24 MED ORDER — SODIUM CHLORIDE 0.9% FLUSH: 3.0000 mL | INTRAVENOUS | Status: DC | PRN

## 2018-08-24 MED ORDER — INSULIN ASPART 100 UNIT/ML ~~LOC~~ SOLN: 0.0000 [IU] | Freq: Three times a day (TID) | SUBCUTANEOUS | Status: DC

## 2018-08-24 MED ORDER — SEMAGLUTIDE (1 MG/DOSE) 2 MG/1.5ML ~~LOC~~ SOPN: 1.0000 mg | PEN_INJECTOR | SUBCUTANEOUS | Status: DC

## 2018-08-24 MED ORDER — INSULIN ASPART 100 UNIT/ML FLEXPEN: 8.0000 [IU] | PEN_INJECTOR | Freq: Three times a day (TID) | SUBCUTANEOUS | Status: DC

## 2018-08-24 MED ORDER — ATORVASTATIN CALCIUM 10 MG PO TABS: 20.0000 mg | ORAL_TABLET | Freq: Every day | ORAL | Status: DC

## 2018-08-24 MED ORDER — ONDANSETRON HCL 4 MG/2ML IJ SOLN: 4.0000 mg | Freq: Four times a day (QID) | INTRAMUSCULAR | Status: DC | PRN

## 2018-08-24 MED ORDER — CALCIUM GLUCONATE 10 % IV SOLN
1.0000 g | Freq: Once | INTRAVENOUS | Status: AC
  Administered 2018-08-24: 1 g via INTRAVENOUS
  Filled 2018-08-24: qty 10

## 2018-08-24 MED ORDER — ADULT MULTIVITAMIN W/MINERALS CH
1.0000 | ORAL_TABLET | Freq: Every day | ORAL | Status: DC
  Administered 2018-08-25 – 2018-08-26 (×2): 1 via ORAL
  Filled 2018-08-24 (×2): qty 1

## 2018-08-24 NOTE — H&P (Signed)
CARDIOLOGY ADMISSION NOTE   Referring Physician: Dr. Thayer Ohm Tegeler Primary Physician: Dr Providence Lanius Primary Cardiologist: None Reason for Admission: Syncope  HPI: Brittney Sawyer is a 53 y.o. female w/ history of HTN, depression, DM2 presenting with syncope.   Patient was at home earlier today with her husband when she began feeling weak, dizzy, and shaky all over.  The symptoms lasted for a few moments and she subsequently syncopized.  She fell to the ground and does not recall any of the events that happened for the next few minutes.  When she woke up she was on the ground and her husband was on the phone with 911.  When EMS arrived, she was found to be in a wide complex rhythm.  She was given a bolus of amiodarone and adenosine with no change in her rhythm.  She was brought to the emergency department for further evaluation.  In the emergency department, the patient was found to be hypotensive with blood pressures around 80/60.  She received multiple IV fluid boluses and was placed in Trendelenburg position with minimal change to her blood pressure.  Her labs were significant for mild acidemia with a bicarb of 19, BUN 21, creatinine 1.25.  Her potassium was normal.  Her LFTs were normal.  Notably, the patient denies any recent illicit ingestions or any other unusual ingested foods or substances.  She has had no changes to her medications in many months.  She has been taking Chantix for about the last 4 months as she has been trying to quit smoking.  Otherwise she has been on the same dose of trazodone, hydrochlorothiazide/lisinopril, and duloxetine for greater than 1 year.  She does not believe there is any chance that she took extra doses of any of these medications.  Also of note, she exercises every day in her living room and denies ever having any symptoms of chest pain, chest pressure, or exertional shortness of breath.  She has no family history significant of sudden cardiac death, pacemaker  implantation, or ICD implantation.  She has no family history of heart failure that she is aware of. Her husband does not take any medications other than lisinopril.   Review of Systems:     Cardiac Review of Systems: {Y] = yes  = no  Chest Pain [    ]  Resting SOB [   ] Exertional SOB  [  ]  Orthopnea [  ]   Pedal Edema [   ]    Palpitations [  ] Syncope  [ X ]   Presyncope [ X  ]  General Review of Systems: [Y] = yes [  ]=no Constitional: recent weight change [  ]; anorexia [  ]; fatigue [  ]; nausea [  ]; night sweats [  ]; fever [  ]; or chills [  ];                                                                     Eyes : blurred vision [  ]; diplopia [   ]; vision changes [  ];  Amaurosis fugax[  ]; Resp: cough [  ];  wheezing[  ];  hemoptysis[  ];  PND [  ];  GI:  gallstones[  ], vomiting[  ];  dysphagia[  ]; melena[  ];  hematochezia [  ]; heartburn[  ];   GU: kidney stones [  ]; hematuria[  ];   dysuria [  ];  nocturia[  ]; incontinence [  ];             Skin: rash, swelling[  ];, hair loss[  ];  peripheral edema[  ];  or itching[  ]; Musculosketetal: myalgias[  ];  joint swelling[  ];  joint erythema[  ];  joint pain[  ];  back pain[  ];  Heme/Lymph: bruising[  ];  bleeding[  ];  anemia[  ];  Neuro: TIA[  ];  headaches[  ];  stroke[  ];  vertigo[  ];  seizures[  ];   paresthesias[  ];  difficulty walking[  ];  Psych:depression[  ]; anxiety[  ];  Endocrine: diabetes[  ];  thyroid dysfunction[  ];  Other:  Past Medical History:  Diagnosis Date   Anxiety    Arthritis    "all my joints" (06/20/2017)   Depression    Diabetes mellitus type 2, controlled (HCC) 05/20/2014   GERD (gastroesophageal reflux disease)    "gone w/diet revision" (06/20/2017)   Hyperlipidemia    Hypertension    Infection    right knee replacement   Neuropathy    post back surgery   Pneumonia X 1   Poorly controlled diabetes mellitus (HCC) 07/26/2017   Right knee DJD 01/27/2013   Spinal  headache    Substance abuse (HCC)    Alcohol - sober since 1996    (Not in a hospital admission)   sodium bicarbonate  50 mEq Intravenous Once    Infusions:  magnesium sulfate 1 - 4 g bolus IVPB      Allergies  Allergen Reactions   Celebrex [Celecoxib] Swelling    SWELLING REACTION UNSPECIFIED    Wellbutrin [Bupropion] Rash    Social History   Socioeconomic History   Marital status: Married    Spouse name: Not on file   Number of children: Not on file   Years of education: Not on file   Highest education level: Not on file  Occupational History   Not on file  Social Needs   Financial resource strain: Not on file   Food insecurity:    Worry: Not on file    Inability: Not on file   Transportation needs:    Medical: Not on file    Non-medical: Not on file  Tobacco Use   Smoking status: Current Every Day Smoker    Packs/day: 0.50    Years: 15.00    Pack years: 7.50    Types: Cigarettes   Smokeless tobacco: Never Used   Tobacco comment: Pt on Chantix  Substance and Sexual Activity   Alcohol use: No    Alcohol/week: 0.0 standard drinks    Comment: Pt is a recovering alcoholic and has been sober since ~2006.   Drug use: Yes    Types: Marijuana, Oxycodone, Hydrocodone    Comment: 2/11//2019 "nothing in the last year"   Sexual activity: Yes    Birth control/protection: Surgical  Lifestyle   Physical activity:    Days per week: Not on file    Minutes per session: Not on file   Stress: Not on file  Relationships   Social connections:    Talks on phone: Not on file    Gets together: Not on file  Attends religious service: Not on file    Active member of club or organization: Not on file    Attends meetings of clubs or organizations: Not on file    Relationship status: Not on file   Intimate partner violence:    Fear of current or ex partner: Not on file    Emotionally abused: Not on file    Physically abused: Not on file    Forced  sexual activity: Not on file  Other Topics Concern   Not on file  Social History Narrative   Not on file    Family History  Problem Relation Age of Onset   Thyroid disease Mother    Hypertension Father    Anxiety disorder Sister    Cancer Paternal Grandmother        prostate    PHYSICAL EXAM: Vitals:   08/24/18 2130 08/24/18 2145  BP: 90/78 (!) 86/70  Pulse: 73 70  Resp: 13 12  Temp:    SpO2: 97% 96%    Intake/Output Summary (Last 24 hours) at 08/24/2018 2205 Last data filed at 08/24/2018 2058 Gross per 24 hour  Intake 600 ml  Output --  Net 600 ml   General:  Pale, anxious, in trendelenburg position  HEENT: normal Neck: supple. Unable to examine JVP in trendelenburg.  Cor: PMI nondisplaced. Summation gallop. No clear murmurs or rubs. Lungs: clear Abdomen: soft, nontender, nondistended. No hepatosplenomegaly. No bruits or masses. Good bowel sounds. Extremities: no cyanosis, clubbing, rash, edema; warm Neuro: alert & oriented x 3, cranial nerves grossly intact. moves all 4 extremities w/o difficulty. Affect pleasant but nervous  ECG: Wide complex tachycardia at approximately 140 bpm, p waves present at approximately 140 ppm, undetermined rhythm though most likely ventricular tachycardia  Results for orders placed or performed during the hospital encounter of 08/24/18 (from the past 24 hour(s))  Troponin I - ONCE - STAT     Status: None   Collection Time: 08/24/18  9:04 PM  Result Value Ref Range   Troponin I <0.03 <0.03 ng/mL  CBC with Differential     Status: Abnormal   Collection Time: 08/24/18  9:04 PM  Result Value Ref Range   WBC 13.9 (H) 4.0 - 10.5 K/uL   RBC 3.58 (L) 3.87 - 5.11 MIL/uL   Hemoglobin 11.9 (L) 12.0 - 15.0 g/dL   HCT 74.1 42.3 - 95.3 %   MCV 103.9 (H) 80.0 - 100.0 fL   MCH 33.2 26.0 - 34.0 pg   MCHC 32.0 30.0 - 36.0 g/dL   RDW 20.2 33.4 - 35.6 %   Platelets 324 150 - 400 K/uL   nRBC 0.0 0.0 - 0.2 %   Neutrophils Relative % 64 %    Neutro Abs 8.8 (H) 1.7 - 7.7 K/uL   Lymphocytes Relative 27 %   Lymphs Abs 3.8 0.7 - 4.0 K/uL   Monocytes Relative 7 %   Monocytes Absolute 1.0 0.1 - 1.0 K/uL   Eosinophils Relative 1 %   Eosinophils Absolute 0.2 0.0 - 0.5 K/uL   Basophils Relative 1 %   Basophils Absolute 0.1 0.0 - 0.1 K/uL   Immature Granulocytes 0 %   Abs Immature Granulocytes 0.05 0.00 - 0.07 K/uL  Comprehensive metabolic panel     Status: Abnormal   Collection Time: 08/24/18  9:04 PM  Result Value Ref Range   Sodium 139 135 - 145 mmol/L   Potassium 3.9 3.5 - 5.1 mmol/L   Chloride 106 98 - 111 mmol/L  CO2 19 (L) 22 - 32 mmol/L   Glucose, Bld 119 (H) 70 - 99 mg/dL   BUN 21 (H) 6 - 20 mg/dL   Creatinine, Ser 6.961.25 (H) 0.44 - 1.00 mg/dL   Calcium 8.8 (L) 8.9 - 10.3 mg/dL   Total Protein 6.2 (L) 6.5 - 8.1 g/dL   Albumin 3.9 3.5 - 5.0 g/dL   AST 32 15 - 41 U/L   ALT 30 0 - 44 U/L   Alkaline Phosphatase 69 38 - 126 U/L   Total Bilirubin 0.4 0.3 - 1.2 mg/dL   GFR calc non Af Amer 49 (L) >60 mL/min   GFR calc Af Amer 57 (L) >60 mL/min   Anion gap 14 5 - 15  POCT I-Stat EG7     Status: Abnormal   Collection Time: 08/24/18  9:07 PM  Result Value Ref Range   pH, Ven 7.358 7.250 - 7.430   pCO2, Ven 40.8 (L) 44.0 - 60.0 mmHg   pO2, Ven 20.0 (LL) 32.0 - 45.0 mmHg   Bicarbonate 22.9 20.0 - 28.0 mmol/L   TCO2 24 22 - 32 mmol/L   O2 Saturation 31.0 %   Acid-base deficit 2.0 0.0 - 2.0 mmol/L   Sodium 140 135 - 145 mmol/L   Potassium 3.8 3.5 - 5.1 mmol/L   Calcium, Ion 1.06 (L) 1.15 - 1.40 mmol/L   HCT 37.0 36.0 - 46.0 %   Hemoglobin 12.6 12.0 - 15.0 g/dL   Patient temperature HIDE    Sample type VENOUS    Comment NOTIFIED PHYSICIAN   I-stat Creatinine, ED     Status: Abnormal   Collection Time: 08/24/18  9:08 PM  Result Value Ref Range   Creatinine, Ser 1.20 (H) 0.44 - 1.00 mg/dL   Dg Chest Portable 1 View  Result Date: 08/24/2018 CLINICAL DATA:  Sudden onset of shortness of breath EXAM: PORTABLE CHEST 1  VIEW COMPARISON:  09/06/16 FINDINGS: The heart size and mediastinal contours are within normal limits. Both lungs are clear. The visualized skeletal structures are unremarkable. IMPRESSION: No active disease. Electronically Signed   By: Alcide CleverMark  Lukens M.D.   On: 08/24/2018 21:35   ASSESSMENT: Brittney Sawyer is a 53 y.o. female w/ history of HTN, depression, DM2 presenting with syncope and found to be in wide complex tachycardia. The patient's case was discussed with the on call electrophysiologist, Dr. Graciela HusbandsKlein. There is no obvious underlying cause for the patient's arrhythmia. Her electrolytes are all within normal limits. She does not endorse any recent changes to her medications, any accidental or purposeful overdose, or any illicit ingestions. She has no known structural heart disease and no family history of SCD or arrhythmia. Interestingly, she does have several prior ECG's from years past with borderline prolonged QRS in IVCD pattern as well as borderline PR prolongation. This suggests a possible smoldering infiltrative disease such as sarcoid. She has reported a weight loss over the past several months that might also be explained by sarcoid (though she has been exercising to lose weight).   The patient's initial ECG is most suggestive of ventricular tachycardia, though PP interval similar to RR so difficult to discern AV dissociation. She spontaneously converted shortly after receiving 1 gram of IV calcium but BEFORE she received sodium bicarbonate or magnesium. Her post conversion ECG reveals evidence of sinus node dysfunction with a widened QRS in IVCD pattern. There is no evidence of overt ischemia either by history or by ECG. This too is suggestive of a likely infiltrative  cause for her arrhythmia such as sarcoid. There is no clear evidence of Brugada pattern or other channelopathy. ARVC is also a possibility, though seems less likely than sarcoid.   PLAN/DISCUSSION: #) Syncope, arrhythmia - admit to  ICU for close monitoring overnight - repeat troponin at 6 hours - order echo in AM - ACE level ordered - will need chest CT to evaluate for evidence of pulmonary sarcoid, though will wait to order this given that she has AKI in the setting of sustained VT - will need cardiac imaging to eval for infiltrative cardiomyopathy such as sarcoid, MRI preferred but patient has many metal prostheses and will need thorough MRI screening prior to proceeding - if unable to obtain MRI, will need to discuss alternative diagnostic modalities such as PET, cardiac biopsy, or EP study - EP to see patient in AM - discussed with EP amiodarone versus procainamide if patient has recurrent VT tonight  #) DM - hold metformin - hold insulin (patient was not taking this at home) - check A1c - ordered home semaglutide  #) AKI:  - reduce home gabapentin to 800mg  TID - hold home lisinopril/HCTZ - gentle fluids overnight - avoid NSAIDs  Rosario Jacks, MD Cardiology Fellow, PGY-6

## 2018-08-24 NOTE — ED Provider Notes (Signed)
[facility] EMERGENCY DEPARTMENT Provider Note   CSN: [id number] Arrival date & time: 08/24/18  2054    History   Chief Complaint Chief Complaint  Patient presents with  . Ventricular Tachycardia    HPI Brittney Sawyer is a 53 y.o. female.     HPI Patient presents by EMS from home after having a suppose it syncopal episode with acute onset shortness of breath without chest pain.  Patient found to be hypotensive by EMS with a wide-complex tachycardia.  6 mg of adenosine given without change as well as 300 mg of amiodarone.  He does not have a history of cardiac dysfunction and only has a history of hypertension and diabetes.  The patient endorses no chest pain and only initially shortness of breath when she initially syncopized which was witnessed by her family.  Has never syncopized in the past.  She admits to drinking kratom tea earlier today at 3 PM.  Denies any other recent complaints and until this happened approximately 30 minutes prior to arrival has been in her normal state of health.   Past Medical History:  Diagnosis Date  . Anxiety   . Arthritis    "all my joints" (06/20/2017)  . Depression   . Diabetes mellitus type 2, controlled (HCC) 05/20/2014  . GERD (gastroesophageal reflux disease)    "gone w/diet revision" (06/20/2017)  . Hyperlipidemia   . Hypertension   . Infection    right knee replacement  . Neuropathy    post back surgery  . Pneumonia X 1  . Poorly controlled diabetes mellitus (HCC) 07/26/2017  . Right knee DJD 01/27/2013  . Spinal headache   . Substance abuse (HCC)    Alcohol - sober since 1996    Patient Active Problem List   Diagnosis Date Noted  . Poorly controlled diabetes mellitus (HCC) 07/26/2017  . DKA (diabetic ketoacidoses) (HCC) 07/20/2017  . Prosthetic joint infection (HCC)   . Septic infrapatellar bursitis of right knee   . S/P revision of total knee 06/20/2017  . Septic joint (HCC) 06/09/2017  . History of tobacco  use 08/31/2016  . Diabetes mellitus type 2, controlled (HCC) 05/20/2014  . Right knee DJD 01/27/2013  . Vaginal candidiasis 01/27/2013  . Metabolic syndrome 12/20/2012  . Obesity (BMI 35.0-39.9 without comorbidity) 12/19/2012  . History of substance use disorder 01/07/2012  . Menopause 11/25/2011  . DDD (degenerative disc disease), lumbar 11/25/2011  . HTN (hypertension) 10/21/2011  . Mood disorder (HCC) 10/21/2011  . Chronic pain 10/21/2011    Past Surgical History:  Procedure Laterality Date  . AMPUTATION Right 10/03/2015   Procedure: Right Foot 3rd Ray Amputation;  Surgeon: Nadara Mustard, MD;  Location: [facility] OR;  Service: Orthopedics;  Laterality: Right;  . BACK SURGERY    . BUNIONECTOMY WITH HAMMERTOE RECONSTRUCTION Bilateral   . EXCISIONAL TOTAL KNEE ARTHROPLASTY WITH ANTIBIOTIC SPACERS Right 06/20/2017  . EXCISIONAL TOTAL KNEE ARTHROPLASTY WITH ANTIBIOTIC SPACERS Right 06/20/2017   Procedure: EXCISION RIGHT TOTAL KNEE ARTHROPLASTY WITH ANTIBIOTIC SPACERS;  Surgeon: Dannielle Huh, MD;  Location: [facility] OR;  Service: Orthopedics;  Laterality: Right;  . JOINT REPLACEMENT    . POSTERIOR LUMBAR FUSION  11/2007   L4, L5   . POSTERIOR LUMBAR FUSION  01/2008     second surgery L2-L5 and S1  . SHOULDER ARTHROSCOPY W/ ROTATOR CUFF REPAIR Left 01/2017  . TOTAL KNEE ARTHROPLASTY Right 01/26/2013   Procedure: TOTAL KNEE ARTHROPLASTY;  Surgeon: Thera Flake., MD;  Location:  [facility] OR;  Service: Orthopedics;  Laterality: Right;  . TOTAL KNEE REVISION Right 08/22/2017   Procedure: RIGHT TOTAL KNEE REVISION WUITH REMOVAL OF ANTIBIOTIC SPACER;  Surgeon: Dannielle Huh, MD;  Location: [facility] OR;  Service: Orthopedics;  Laterality: Right;  . TUBAL LIGATION    . VAGINAL HYSTERECTOMY       OB History   No obstetric history on file.      Home Medications    Prior to Admission medications   Medication Sig Start Date End Date Taking? Authorizing Provider  acetaminophen (TYLENOL) 500 MG tablet Take 2 tablets  (1,000 mg total) by mouth every 8 (eight) hours as needed. Patient taking differently: Take 1,000 mg by mouth every 8 (eight) hours.  08/26/16   Sherren Mocha, MD  acyclovir (ZOVIRAX) 200 MG capsule TAKE TWO CAPSULES BY MOUTH THREE TIMES A DAY FOR 5 DAYS AS NEEDED FOR OUTBREAK Patient not taking: Reported on 08/03/2017 11/13/16   Trena Platt D, PA  aspirin EC 325 MG EC tablet Take 1 tablet (325 mg total) by mouth 2 (two) times daily. 08/23/17   Guy Sandifer, PA  B Complex-C (B-COMPLEX WITH VITAMIN C) tablet Take 1 tablet by mouth daily.    [provider]  diphenhydrAMINE (BENADRYL) 25 mg capsule Take 50 mg by mouth at bedtime.    [provider]  DULoxetine (CYMBALTA) 60 MG capsule TAKE ONE CAPSULE BY MOUTH TWICE A DAY **MUST CALL MD FOR APPOINTMENT 07/11/17   Trena Platt D, PA  gabapentin (NEURONTIN) 800 MG tablet Take 1 tablet (800 mg total) by mouth 4 (four) times daily. Office visit needed 05/02/17   Trena Platt D, PA  HYDROmorphone (DILAUDID) 2 MG tablet Take 1 tablet (2 mg total) by mouth every 4 (four) hours as needed (breakthrough post-op pain). 08/23/17   Guy Sandifer, PA  insulin aspart (NOVOLOG FLEXPEN) 100 UNIT/ML FlexPen Inject 8 Units into the skin 3 (three) times daily with meals. 07/22/17   Ghimire, Werner Lean, MD  Insulin Glargine (LANTUS) 100 UNIT/ML Solostar Pen Inject 25 Units into the skin daily at 10 pm. 07/22/17   Ghimire, Werner Lean, MD  Insulin Pen Needle 32G X 8 MM MISC Use as directed 07/22/17   Maretta Bees, MD  lisinopril-hydrochlorothiazide (PRINZIDE,ZESTORETIC) 20-25 MG tablet TAKE ONE TABLET BY MOUTH DAILY 09/03/16   Sherren Mocha, MD  metFORMIN (GLUCOPHAGE) 500 MG tablet Take 1 tablet (500 mg total) by mouth 2 (two) times daily with a meal. Patient taking differently: Take 1,000 mg by mouth at bedtime.  07/22/17   Ghimire, Werner Lean, MD  methocarbamol (ROBAXIN) 500 MG tablet Take 1-2 tablets (500-1,000 mg total) by mouth 4  (four) times daily. 08/23/17   Guy Sandifer, PA  Multiple Vitamin (MULTIVITAMIN) tablet Take 1 tablet by mouth daily.    [provider]  NICOTINE TD Place 1 patch onto the skin daily.    [provider]  oxyCODONE 10 MG TABS Take 1 tablet (10 mg total) by mouth every 4 (four) hours as needed for moderate pain or severe pain (pain score 7-10). 08/23/17   Guy Sandifer, PA  traZODone (DESYREL) 100 MG tablet TAKE ONE TABLET BY MOUTH THREE TIMES A DAY -- NEED OFFICE VISIT Patient taking differently: TAKE TWO-THREE TABLETS (300 MG) BY MOUTH AT BEDTIME FOR SLEEP. 01/20/17   Ethelda Chick, MD  varenicline (CHANTIX) 1 MG tablet Take 1 mg by mouth 2 (two) times daily.    [provider]    Family History Family History  Problem Relation Age of Onset  . Thyroid disease Mother   . Hypertension Father   . Anxiety disorder Sister   . Cancer Paternal Grandmother        prostate    Social History Social History   Tobacco Use  . Smoking status: Current Every Day Smoker    Packs/day: 0.50    Years: 15.00    Pack years: 7.50    Types: Cigarettes  . Smokeless tobacco: Never Used  . Tobacco comment: Pt on Chantix  Substance Use Topics  . Alcohol use: No    Alcohol/week: 0.0 standard drinks    Comment: Pt is a recovering alcoholic and has been sober since ~2006.  . Drug use: Yes    Types: Marijuana, Oxycodone, Hydrocodone    Comment: 2/11//2019 "nothing in the last year"     Allergies   Celebrex [celecoxib] and Wellbutrin [bupropion]   Review of Systems Review of Systems  Constitutional: Positive for activity change. Negative for chills and fever.  HENT: Negative for ear pain and sore throat.   Eyes: Negative for pain and visual disturbance.  Respiratory: Negative for cough and shortness of breath.   Cardiovascular: Negative for chest pain and palpitations.  Gastrointestinal: Negative for abdominal pain and vomiting.  Genitourinary: Negative for  dysuria and hematuria.  Musculoskeletal: Negative for arthralgias and back pain.  Skin: Negative for color change and rash.  Neurological: Positive for syncope. Negative for seizures.  All other systems reviewed and are negative.    Physical Exam Updated Vital Signs BP (!) 86/70   Pulse 70   Temp (!) 96.7 F (35.9 C) (Temporal)   Resp 12   Ht 5' (1.524 m)   Wt 68 kg   SpO2 96%   BMI 29.29 kg/m   Physical Exam Vitals signs and nursing note reviewed.  Constitutional:      General: She is not in acute distress.    Appearance: She is well-developed.  HENT:     Head: Normocephalic and atraumatic.  Eyes:     Conjunctiva/sclera: Conjunctivae normal.  Neck:     Musculoskeletal: Neck supple.  Cardiovascular:     Rate and Rhythm: Regular rhythm. Tachycardia present.     Heart sounds: No murmur.     Comments: Bilateral radial and dorsalis pedis pulses intact and equal. Pulmonary:     Effort: Pulmonary effort is normal. No respiratory distress.     Breath sounds: Normal breath sounds.  Abdominal:     Palpations: Abdomen is soft.     Tenderness: There is no abdominal tenderness.  Musculoskeletal:        General: No deformity or signs of injury.     Right lower leg: No edema.     Left lower leg: No edema.  Skin:    General: Skin is warm and dry.     Findings: No rash.  Neurological:     Mental Status: She is alert.      ED Treatments / Results  Labs (all labs ordered are listed, but only abnormal results are displayed) Labs Reviewed  CBC WITH DIFFERENTIAL/PLATELET - Abnormal; Notable for the following components:      Result Value   WBC 13.9 (*)    RBC 3.58 (*)    Hemoglobin 11.9 (*)    MCV 103.9 (*)    Neutro Abs 8.8 (*)    All other components within normal limits  COMPREHENSIVE METABOLIC PANEL - Abnormal; Notable  for the following components:   CO2 19 (*)    Glucose, Bld 119 (*)    BUN 21 (*)    Creatinine, Ser 1.25 (*)    Calcium 8.8 (*)    Total Protein  6.2 (*)    GFR calc non Af Amer 49 (*)    GFR calc Af Amer 57 (*)    All other components within normal limits  I-STAT CREATININE, ED - Abnormal; Notable for the following components:   Creatinine, Ser 1.20 (*)    All other components within normal limits  POCT I-STAT EG7 - Abnormal; Notable for the following components:   pCO2, Ven 40.8 (*)    pO2, Ven 20.0 (*)    Calcium, Ion 1.06 (*)    All other components within normal limits  TROPONIN I  ETHANOL  SALICYLATE LEVEL  ACETAMINOPHEN LEVEL  CBG MONITORING, ED    EKG EKG Interpretation  Date/Time:  Thursday August 24 2018 20:58:45 EDT Ventricular Rate:  143 PR Interval:    QRS Duration: 185 QT Interval:  387 QTC Calculation: 618 R Axis:   138 Text Interpretation:  sinus rhythm Ventricular premature complex Nonspecific intraventricular conduction delay Probable lateral infarct, age indeterminate Probable anteroseptal infarct, recent Lead(s) aVR were not used for morphology analysis When compared to prior, now wide complex rhythm.  No STEMI Confirmed by Theda Belfast ([id number]) on 08/24/2018 9:19:34 PM   Radiology Dg Chest Portable 1 View  Result Date: 08/24/2018 CLINICAL DATA:  Sudden onset of shortness of breath EXAM: PORTABLE CHEST 1 VIEW COMPARISON:  09/06/16 FINDINGS: The heart size and mediastinal contours are within normal limits. Both lungs are clear. The visualized skeletal structures are unremarkable. IMPRESSION: No active disease. Electronically Signed   By: Alcide Clever M.D.   On: 08/24/2018 21:35    Procedures ARTERIAL LINE Date/Time: 08/24/2018 11:33 PM Performed by: Ina Kick, MD Authorized by: Ina Kick, MD   Consent:    Consent obtained:  Emergent situation   Consent given by:  Patient Indications:    Indications: hemodynamic monitoring   Pre-procedure details:    Skin preparation:  2% Chlorhexidine Anesthesia (see MAR for exact dosages):    Anesthesia method:  None Procedure details:     Location:  L radial   Needle gauge:  22 G   Placement technique:  Seldinger   Number of attempts:  1   Transducer: waveform confirmed   Post-procedure details:    Post-procedure:  Sterile dressing applied   CMS:  Normal   Patient tolerance of procedure:  Tolerated well, no immediate complications   (including critical care time)  Medications Ordered in ED Medications  sodium chloride 0.9 % bolus 1,000 mL (1,000 mLs Intravenous New Bag/Given 08/24/18 2118)  calcium gluconate inj 10% (1 g) URGENT USE ONLY! (1 g Intravenous Given 08/24/18 2118)     Initial Impression / Assessment and Plan / ED Course  I have reviewed the triage vital signs and the nursing notes.  Pertinent labs & imaging results that were available during my care of the patient were reviewed by me and considered in my medical decision making (see chart for details).        Patient presents to the emergency department for the above history and physical examination concerning for wide-complex tachycardia with low blood pressure initially.  Arterial line placed given labile blood pressures and initially showed blood pressure of 80 systolic.  Patient was given 2 L of fluid bolus by this time and cardiology was  consulted to see the patient.  We have placed the orders to give magnesium as well as sodium bicarb in case there is any sodium channel blockade from the crowd MT as it appears that she does not have any other significant findings to suggest cause for a new wide-complex tachycardia.  Initial troponin negative.  Approximately an hour and a half into her ED course before she received magnesium and sodium bicarbonate, she spontaneously converted into atrial fibrillation with a QRS of about 150 ms.  QRS previously was normal.  Denies any chest pain at this time.  Otherwise hemodynamically stable.  Admitted to the cardiology service for further management and work-up. Final Clinical Impressions(s) / ED Diagnoses   Final  diagnoses:  Cardiac arrhythmia, unspecified cardiac arrhythmia type  Other specified hypotension    ED Discharge Orders    None       Ina Kick, MD 08/24/18 2334    Tegeler, Canary Brim, MD 08/25/18 1044

## 2018-08-24 NOTE — ED Triage Notes (Signed)
BIB EMS from home, reports sudden onset of weakness/SOB. EMS arrival and pt noted to be in vtach, hypotensive, "seizure like" activity. Given 300 Amiodarone, 6 Adenosine en route. Remains in vtach. EDP at bedside.

## 2018-08-24 NOTE — Progress Notes (Signed)
Arterial line transducer set up. Resident MD wants to place it.

## 2018-08-24 NOTE — ED Notes (Signed)
Cardiology at bedside, ED resident at bedside for arterial line

## 2018-08-24 NOTE — Progress Notes (Signed)
Arterial line zeroed good reading and waveform. MD resident placed left radial arterial line.

## 2018-08-24 NOTE — ED Notes (Signed)
Pt converted self to afib

## 2018-08-24 NOTE — ED Notes (Addendum)
Pt husband Jarvis Newcomer) available and waiting in car, can be reached at [phone] or [phone]

## 2018-08-24 NOTE — ED Notes (Signed)
Dr Tegeler informed of critical value on I Stat EG7

## 2018-08-25 ENCOUNTER — Inpatient Hospital Stay (HOSPITAL_COMMUNITY): Payer: Commercial Managed Care - PPO

## 2018-08-25 ENCOUNTER — Encounter (HOSPITAL_COMMUNITY)
Admission: EM | Disposition: A | Discharge status: Home / Self Care | Payer: Self-pay | Source: Home / Self Care | Attending: Internal Medicine

## 2018-08-25 ENCOUNTER — Inpatient Hospital Stay (HOSPITAL_COMMUNITY)
Admission: EM | Disposition: A | Discharge status: Home / Self Care | Payer: Self-pay | Source: Home / Self Care | Attending: Internal Medicine

## 2018-08-25 DIAGNOSIS — I472 Ventricular tachycardia: Secondary | ICD-10-CM

## 2018-08-25 DIAGNOSIS — I428 Other cardiomyopathies: Secondary | ICD-10-CM

## 2018-08-25 DIAGNOSIS — I351 Nonrheumatic aortic (valve) insufficiency: Secondary | ICD-10-CM

## 2018-08-25 HISTORY — PX: ICD IMPLANT: EP1208

## 2018-08-25 HISTORY — PX: LEFT HEART CATH AND CORONARY ANGIOGRAPHY: CATH118249

## 2018-08-25 LAB — GLUCOSE, CAPILLARY
Glucose-Capillary: 108 mg/dL — ABNORMAL HIGH (ref 70–99)
Glucose-Capillary: 80 mg/dL (ref 70–99)
Glucose-Capillary: 87 mg/dL (ref 70–99)
Glucose-Capillary: 74 mg/dL (ref 70–99)

## 2018-08-25 LAB — CREATININE, SERUM
Creatinine, Ser: 0.85 mg/dL (ref 0.44–1.00)
GFR calc Af Amer: >60 mL/min (ref >60)
GFR calc non Af Amer: >60 mL/min (ref >60)

## 2018-08-25 LAB — MAGNESIUM: Magnesium: 2 mg/dL (ref 1.7–2.4)

## 2018-08-25 LAB — HIV ANTIBODY (ROUTINE TESTING W REFLEX): HIV Screen 4th Generation wRfx: NONREACTIVE

## 2018-08-25 LAB — BASIC METABOLIC PANEL
Anion gap: 7 (ref 5–15)
BUN: 18 mg/dL (ref 6–20)
CO2: 27 mmol/L (ref 22–32)
Calcium: 8.4 mg/dL — ABNORMAL LOW (ref 8.9–10.3)
Chloride: 105 mmol/L (ref 98–111)
Creatinine, Ser: 0.88 mg/dL (ref 0.44–1.00)
GFR calc Af Amer: >60 mL/min (ref >60)
GFR calc non Af Amer: >60 mL/min (ref >60)
Glucose, Bld: 133 mg/dL — ABNORMAL HIGH (ref 70–99)
Potassium: 4.4 mmol/L (ref 3.5–5.1)
Sodium: 139 mmol/L (ref 135–145)

## 2018-08-25 LAB — TROPONIN I: Troponin I: <0.03 ng/mL (ref <0.03)

## 2018-08-25 LAB — CBC
HCT: 30.8 % — ABNORMAL LOW (ref 36.0–46.0)
Hemoglobin: 9.5 g/dL — ABNORMAL LOW (ref 12.0–15.0)
MCH: 31.8 pg (ref 26.0–34.0)
MCHC: 30.8 g/dL (ref 30.0–36.0)
MCV: 103 fL — ABNORMAL HIGH (ref 80.0–100.0)
Platelets: 265 10*3/uL (ref 150–400)
RBC: 2.99 MIL/uL — ABNORMAL LOW (ref 3.87–5.11)
RDW: 12.5 % (ref 11.5–15.5)
WBC: 10.3 10*3/uL (ref 4.0–10.5)
nRBC: 0 % (ref 0.0–0.2)

## 2018-08-25 LAB — ECHOCARDIOGRAM COMPLETE
Height: 60 in
Weight: 2507.95 oz

## 2018-08-25 LAB — MRSA PCR SCREENING: MRSA by PCR: NEGATIVE

## 2018-08-25 LAB — SURGICAL PCR SCREEN
MRSA, PCR: NEGATIVE
Staphylococcus aureus: POSITIVE — AB

## 2018-08-25 SURGERY — LEFT HEART CATH AND CORONARY ANGIOGRAPHY
Anesthesia: LOCAL

## 2018-08-25 SURGERY — ICD IMPLANT

## 2018-08-25 MED ORDER — CEFAZOLIN SODIUM-DEXTROSE 2-4 GM/100ML-% IV SOLN
INTRAVENOUS | Status: AC
  Filled 2018-08-25: qty 100

## 2018-08-25 MED ORDER — SODIUM CHLORIDE 0.9 % IV SOLN
INTRAVENOUS | Status: DC
  Administered 2018-08-25: 13:00:00 via INTRAVENOUS

## 2018-08-25 MED ORDER — CHLORHEXIDINE GLUCONATE 4 % EX LIQD
60.0000 mL | Freq: Once | CUTANEOUS | Status: AC
  Administered 2018-08-25: 4 via TOPICAL
  Filled 2018-08-25: qty 60

## 2018-08-25 MED ORDER — MIDAZOLAM HCL 2 MG/2ML IJ SOLN
INTRAMUSCULAR | Status: DC | PRN
  Administered 2018-08-25: 2 mg via INTRAVENOUS

## 2018-08-25 MED ORDER — CEFAZOLIN SODIUM-DEXTROSE 2-4 GM/100ML-% IV SOLN
2.0000 g | INTRAVENOUS | Status: AC
  Administered 2018-08-25: 2 g via INTRAVENOUS
  Filled 2018-08-25: qty 100

## 2018-08-25 MED ORDER — HEPARIN SODIUM (PORCINE) 1000 UNIT/ML IJ SOLN
INTRAMUSCULAR | Status: DC | PRN
  Administered 2018-08-25: 3500 [IU] via INTRAVENOUS

## 2018-08-25 MED ORDER — SODIUM CHLORIDE 0.9 % IV SOLN: INTRAVENOUS | Status: AC

## 2018-08-25 MED ORDER — LIDOCAINE HCL (PF) 1 % IJ SOLN
INTRAMUSCULAR | Status: AC
  Filled 2018-08-25: qty 30

## 2018-08-25 MED ORDER — VERAPAMIL HCL 2.5 MG/ML IV SOLN
INTRAVENOUS | Status: DC | PRN
  Administered 2018-08-25: 10 mL via INTRA_ARTERIAL

## 2018-08-25 MED ORDER — CHLORHEXIDINE GLUCONATE 4 % EX LIQD: 60.0000 mL | Freq: Once | CUTANEOUS | Status: AC

## 2018-08-25 MED ORDER — MIDAZOLAM HCL 5 MG/5ML IJ SOLN
INTRAMUSCULAR | Status: AC
  Filled 2018-08-25: qty 5

## 2018-08-25 MED ORDER — ONDANSETRON HCL 4 MG/2ML IJ SOLN: 4.0000 mg | Freq: Four times a day (QID) | INTRAMUSCULAR | Status: DC | PRN

## 2018-08-25 MED ORDER — MIDAZOLAM HCL 2 MG/2ML IJ SOLN
INTRAMUSCULAR | Status: AC
  Filled 2018-08-25: qty 2

## 2018-08-25 MED ORDER — SODIUM CHLORIDE 0.9% FLUSH: 3.0000 mL | INTRAVENOUS | Status: DC | PRN

## 2018-08-25 MED ORDER — FENTANYL CITRATE (PF) 100 MCG/2ML IJ SOLN
INTRAMUSCULAR | Status: AC
  Filled 2018-08-25: qty 2

## 2018-08-25 MED ORDER — SODIUM CHLORIDE 0.9% FLUSH: 3.0000 mL | Freq: Two times a day (BID) | INTRAVENOUS | Status: DC

## 2018-08-25 MED ORDER — ACETAMINOPHEN 325 MG PO TABS
650.0000 mg | ORAL_TABLET | ORAL | Status: DC | PRN
  Administered 2018-08-25: 650 mg via ORAL
  Filled 2018-08-25: qty 2

## 2018-08-25 MED ORDER — DIAZEPAM 5 MG PO TABS
5.0000 mg | ORAL_TABLET | Freq: Once | ORAL | Status: AC | PRN
  Administered 2018-08-25: 10:00:00 5 mg via ORAL
  Filled 2018-08-25: qty 1

## 2018-08-25 MED ORDER — TRAZODONE HCL 100 MG PO TABS
200.0000 mg | ORAL_TABLET | Freq: Every day | ORAL | Status: DC
  Administered 2018-08-25: 200 mg via ORAL
  Filled 2018-08-25: qty 2

## 2018-08-25 MED ORDER — HYDRALAZINE HCL 20 MG/ML IJ SOLN: 10.0000 mg | INTRAMUSCULAR | Status: AC | PRN

## 2018-08-25 MED ORDER — TRAMADOL HCL 50 MG PO TABS
50.0000 mg | ORAL_TABLET | Freq: Four times a day (QID) | ORAL | Status: DC | PRN
  Administered 2018-08-25 (×2): 100 mg via ORAL
  Filled 2018-08-25 (×2): qty 2

## 2018-08-25 MED ORDER — HEPARIN (PORCINE) IN NACL 1000-0.9 UT/500ML-% IV SOLN
INTRAVENOUS | Status: DC | PRN
  Administered 2018-08-25 (×2): 500 mL

## 2018-08-25 MED ORDER — HEPARIN (PORCINE) IN NACL 1000-0.9 UT/500ML-% IV SOLN
INTRAVENOUS | Status: DC | PRN
  Administered 2018-08-25: 500 mL

## 2018-08-25 MED ORDER — CHLORHEXIDINE GLUCONATE CLOTH 2 % EX PADS: 6.0000 | MEDICATED_PAD | Freq: Every day | CUTANEOUS | Status: DC

## 2018-08-25 MED ORDER — IOHEXOL 350 MG/ML SOLN
INTRAVENOUS | Status: DC | PRN
  Administered 2018-08-25: 70 mL via INTRAVENOUS

## 2018-08-25 MED ORDER — LIDOCAINE HCL (PF) 1 % IJ SOLN
INTRAMUSCULAR | Status: DC | PRN
  Administered 2018-08-25: 50 mL

## 2018-08-25 MED ORDER — SODIUM CHLORIDE 0.9% FLUSH
3.0000 mL | Freq: Two times a day (BID) | INTRAVENOUS | Status: DC
  Administered 2018-08-25: 3 mL via INTRAVENOUS

## 2018-08-25 MED ORDER — ACETAMINOPHEN 325 MG PO TABS: 325.0000 mg | ORAL_TABLET | ORAL | Status: DC | PRN

## 2018-08-25 MED ORDER — ENOXAPARIN SODIUM 40 MG/0.4ML ~~LOC~~ SOLN
40.0000 mg | SUBCUTANEOUS | Status: DC
  Administered 2018-08-26: 40 mg via SUBCUTANEOUS
  Filled 2018-08-25: qty 0.4

## 2018-08-25 MED ORDER — SODIUM CHLORIDE 0.9 % IV SOLN: 250.0000 mL | INTRAVENOUS | Status: DC | PRN

## 2018-08-25 MED ORDER — SODIUM CHLORIDE 0.9 % IV SOLN: INTRAVENOUS | Status: DC

## 2018-08-25 MED ORDER — FENTANYL CITRATE (PF) 100 MCG/2ML IJ SOLN
INTRAMUSCULAR | Status: DC | PRN
  Administered 2018-08-25 (×3): 12.5 ug via INTRAVENOUS
  Administered 2018-08-25: 25 ug via INTRAVENOUS
  Administered 2018-08-25: 12.5 ug via INTRAVENOUS

## 2018-08-25 MED ORDER — VERAPAMIL HCL 2.5 MG/ML IV SOLN
INTRAVENOUS | Status: AC
  Filled 2018-08-25: qty 2

## 2018-08-25 MED ORDER — FENTANYL CITRATE (PF) 100 MCG/2ML IJ SOLN
INTRAMUSCULAR | Status: DC | PRN
  Administered 2018-08-25: 25 ug via INTRAVENOUS

## 2018-08-25 MED ORDER — ASPIRIN 81 MG PO CHEW: 81.0000 mg | CHEWABLE_TABLET | ORAL | Status: DC

## 2018-08-25 MED ORDER — SODIUM CHLORIDE 0.9 % IV SOLN: 250.0000 mL | INTRAVENOUS | Status: DC

## 2018-08-25 MED ORDER — MIDAZOLAM HCL 5 MG/5ML IJ SOLN
INTRAMUSCULAR | Status: DC | PRN
  Administered 2018-08-25: 2 mg via INTRAVENOUS
  Administered 2018-08-25 (×4): 1 mg via INTRAVENOUS
  Administered 2018-08-25 (×2): 2 mg via INTRAVENOUS

## 2018-08-25 MED ORDER — HEPARIN (PORCINE) IN NACL 1000-0.9 UT/500ML-% IV SOLN
INTRAVENOUS | Status: AC
  Filled 2018-08-25: qty 1000

## 2018-08-25 MED ORDER — MUPIROCIN 2 % EX OINT
TOPICAL_OINTMENT | CUTANEOUS | Status: AC
  Administered 2018-08-25: 13:00:00
  Filled 2018-08-25: qty 22

## 2018-08-25 MED ORDER — INSULIN ASPART 100 UNIT/ML ~~LOC~~ SOLN: 0.0000 [IU] | Freq: Three times a day (TID) | SUBCUTANEOUS | Status: DC

## 2018-08-25 MED ORDER — SODIUM CHLORIDE 0.9 % IV SOLN
80.0000 mg | INTRAVENOUS | Status: AC
  Administered 2018-08-25: 80 mg
  Filled 2018-08-25 (×2): qty 2

## 2018-08-25 MED ORDER — ASPIRIN 81 MG PO CHEW
CHEWABLE_TABLET | ORAL | Status: AC
  Administered 2018-08-25: 14:00:00
  Filled 2018-08-25: qty 1

## 2018-08-25 MED ORDER — LABETALOL HCL 5 MG/ML IV SOLN: 10.0000 mg | INTRAVENOUS | Status: AC | PRN

## 2018-08-25 MED ORDER — IOHEXOL 350 MG/ML SOLN
INTRAVENOUS | Status: DC | PRN
  Administered 2018-08-25: 50 mL via INTRAVENOUS

## 2018-08-25 MED ORDER — SODIUM CHLORIDE 0.9 % IV SOLN
INTRAVENOUS | Status: AC
  Filled 2018-08-25: qty 2

## 2018-08-25 MED ORDER — LIDOCAINE HCL (PF) 1 % IJ SOLN
INTRAMUSCULAR | Status: DC | PRN
  Administered 2018-08-25: 2 mL via INTRADERMAL

## 2018-08-25 MED ORDER — GADOBUTROL 1 MMOL/ML IV SOLN
10.0000 mL | Freq: Once | INTRAVENOUS | Status: AC | PRN
  Administered 2018-08-25: 11:00:00 10 mL via INTRAVENOUS

## 2018-08-25 MED ORDER — HEPARIN SODIUM (PORCINE) 1000 UNIT/ML IJ SOLN
INTRAMUSCULAR | Status: AC
  Filled 2018-08-25: qty 1

## 2018-08-25 MED ORDER — CEFAZOLIN SODIUM-DEXTROSE 1-4 GM/50ML-% IV SOLN
1.0000 g | Freq: Four times a day (QID) | INTRAVENOUS | Status: DC
  Administered 2018-08-25 – 2018-08-26 (×2): 1 g via INTRAVENOUS
  Filled 2018-08-25 (×3): qty 50

## 2018-08-25 MED ORDER — LIDOCAINE HCL 1 % IJ SOLN
INTRAMUSCULAR | Status: AC
  Filled 2018-08-25: qty 60

## 2018-08-25 SURGICAL SUPPLY — 9 items
CABLE SURGICAL S-101-97-12 (CABLE) ×2 IMPLANT
ICD VIGILANT DR D233 (Pacemaker) ×2 IMPLANT
INGEVITY MRI 7740-45CM (Lead) ×2 IMPLANT
LEAD PACING INGEVITY MRI 45CM (Lead) ×1 IMPLANT
LEAD RELIANCE G DF4 0292 (Lead) ×2 IMPLANT
PAD PRO RADIOLUCENT 2001M-C (PAD) ×2 IMPLANT
SHEATH CLASSIC 7F (SHEATH) ×2 IMPLANT
SHEATH CLASSIC 9F (SHEATH) ×2 IMPLANT
TRAY PACEMAKER INSERTION (PACKS) ×2 IMPLANT

## 2018-08-25 SURGICAL SUPPLY — 10 items
CATH 5FR JL3.5 JR4 ANG PIG MP (CATHETERS) ×2 IMPLANT
DEVICE RAD COMP TR BAND LRG (VASCULAR PRODUCTS) ×2 IMPLANT
ELECT DEFIB PAD ADLT CADENCE (PAD) ×2 IMPLANT
GLIDESHEATH SLEND SS 6F .021 (SHEATH) ×2 IMPLANT
GUIDEWIRE INQWIRE 1.5J.035X260 (WIRE) ×1 IMPLANT
INQWIRE 1.5J .035X260CM (WIRE) ×2
KIT HEART LEFT (KITS) ×2 IMPLANT
PACK CARDIAC CATHETERIZATION (CUSTOM PROCEDURE TRAY) ×2 IMPLANT
TRANSDUCER W/STOPCOCK (MISCELLANEOUS) ×2 IMPLANT
TUBING CIL FLEX 10 FLL-RA (TUBING) ×2 IMPLANT

## 2018-08-25 NOTE — Discharge Instructions (Signed)
ONCE HOME, PLEASE SEND A REMOTE DEVICE TRANSMISSION  and  PLEASE SIGN UP TO YOUR MY CHART ACCOUNT WHEN YOU GET HOME (if you aren't already).  IN THE CURRENT ENVIRONMENT WITH COVID-19, IN EFFORT TO REDUCE YOUR EXPOSURE WE WILL BE CONDUCTING MANY PATIENT VISITS BY EITHER VIRTUAL/VIDEO VISITS or TELEPHONE VISITS.  BEING SIGNED UP IN YOUR MY CHART ACCOUNT  WILL HELP FACILITATE THESE VISITS AND OUR COMMUNICATION WITH YOU       Supplemental Discharge Instructions for  Pacemaker/Defibrillator Patients  Activity No heavy lifting or vigorous activity with your left/right arm for 6 to 8 weeks.  Do not raise your left/right arm above your head for one week.  Gradually raise your affected arm as drawn below.              08/29/2018                 08/30/2018               08/31/2018                09/01/2018 __  NO DRIVING 6 months.  WOUND CARE - Keep the wound area clean and dry.  Do not get this area wet for one week. No showers for one week; you may shower on  09/01/2018   . - The tape/steri-strips on your wound will fall off; do not pull them off.  No bandage is needed on the site.  DO  NOT apply any creams, oils, or ointments to the wound area. - If you notice any drainage or discharge from the wound, any swelling or bruising at the site, or you develop a fever > 101? F after you are discharged home, call the office at once.  Special Instructions - You are still able to use cellular telephones; use the ear opposite the side where you have your pacemaker/defibrillator.  Avoid carrying your cellular phone near your device. - When traveling through airports, show security personnel your identification card to avoid being screened in the metal detectors.  Ask the security personnel to use the hand wand. - Avoid arc welding equipment, MRI testing (magnetic resonance imaging), TENS units (transcutaneous nerve stimulators).  Call the office for questions about other devices. - Avoid electrical  appliances that are in poor condition or are not properly grounded. - Microwave ovens are safe to be near or to operate.  Additional information for defibrillator patients should your device go off: - If your device goes off ONCE and you feel fine afterward, notify the device clinic nurses. - If your device goes off ONCE and you do not feel well afterward, call 911. - If your device goes off TWICE, call 911. - If your device goes off THREE times in one day, call 911.  DO NOT DRIVE YOURSELF OR A FAMILY MEMBER WITH A DEFIBRILLATOR TO THE HOSPITAL--CALL 911.    Radial Site Care  This sheet gives you information about how to care for yourself after your procedure. Your health care provider may also give you more specific instructions. If you have problems or questions, contact your health care provider. What can I expect after the procedure? After the procedure, it is common to have:  Bruising and tenderness at the catheter insertion area. Follow these instructions at home: Medicines  Take over-the-counter and prescription medicines only as told by your health care provider. Insertion site care  Follow instructions from your health care provider about how to take care of your insertion  site. Make sure you: ? Wash your hands with soap and water before you change your bandage (dressing). If soap and water are not available, use hand sanitizer. ? Change your dressing as told by your health care provider. ? Leave stitches (sutures), skin glue, or adhesive strips in place. These skin closures may need to stay in place for 2 weeks or longer. If adhesive strip edges start to loosen and curl up, you may trim the loose edges. Do not remove adhesive strips completely unless your health care provider tells you to do that.  Check your insertion site every day for signs of infection. Check for: ? Redness, swelling, or pain. ? Fluid or blood. ? Pus or a bad smell. ? Warmth.  Do not take baths,  swim, or use a hot tub until your health care provider approves.  You may shower 24-48 hours after the procedure, or as directed by your health care provider. ? Remove the dressing and gently wash the site with plain soap and water. ? Pat the area dry with a clean towel. ? Do not rub the site. That could cause bleeding.  Do not apply powder or lotion to the site. Activity   For 24 hours after the procedure, or as directed by your health care provider: ? Do not flex or bend the affected arm. ? Do not push or pull heavy objects with the affected arm. ? Do not drive yourself home from the hospital or clinic. You may drive 24 hours after the procedure unless your health care provider tells you not to. ? Do not operate machinery or power tools.  Do not lift anything that is heavier than 10 lb (4.5 kg), or the limit that you are told, until your health care provider says that it is safe.  Ask your health care provider when it is okay to: ? Return to work or school. ? Resume usual physical activities or sports. ? Resume sexual activity. General instructions  If the catheter site starts to bleed, raise your arm and put firm pressure on the site. If the bleeding does not stop, get help right away. This is a medical emergency.  If you went home on the same day as your procedure, a responsible adult should be with you for the first 24 hours after you arrive home.  Keep all follow-up visits as told by your health care provider. This is important. Contact a health care provider if:  You have a fever.  You have redness, swelling, or yellow drainage around your insertion site. Get help right away if:  You have unusual pain at the radial site.  The catheter insertion area swells very fast.  The insertion area is bleeding, and the bleeding does not stop when you hold steady pressure on the area.  Your arm or hand becomes pale, cool, tingly, or numb. These symptoms may represent a serious  problem that is an emergency. Do not wait to see if the symptoms will go away. Get medical help right away. Call your local emergency services (911 in the [place] Do not drive yourself to the hospital. Summary  After the procedure, it is common to have bruising and tenderness at the site.  Follow instructions from your health care provider about how to take care of your radial site wound. Check the wound every day for signs of infection.  Do not lift anything that is heavier than 10 lb (4.5 kg), or the limit that you are told, until your health  care provider says that it is safe. This information is not intended to replace advice given to you by your health care provider. Make sure you discuss any questions you have with your health care provider. Document Released: 05/29/2010 Document Revised: 06/01/2017 Document Reviewed: 06/01/2017 Elsevier Interactive Patient Education  2019 [employer].

## 2018-08-25 NOTE — Progress Notes (Signed)
To EP lab for ICD implant

## 2018-08-25 NOTE — Progress Notes (Signed)
  Echocardiogram 2D Echocardiogram has been performed.  Celene Skeen 08/25/2018, 9:11 AM

## 2018-08-25 NOTE — H&P (View-Only) (Signed)
Advanced Heart Failure Rounding Note   Subjective:    No further VT or syncope.  Denies antecedent CP. No family h/o SCD.   No mag level on chart. Denies IVDA or symptoms of endocarditis    Objective:   Weight Range:  Vital Signs:   Temp:  [96.7 F (35.9 C)-98.4 F (36.9 C)] 97.7 F (36.5 C) (04/17 0700) Pulse Rate:  [32-137] 38 (04/17 0645) Resp:  [11-31] 19 (04/17 0700) BP: (73-116)/(43-78) 91/65 (04/17 0700) SpO2:  [91 %-100 %] 96 % (04/17 0645) Arterial Line BP: (82-138)/(36-84) 107/61 (04/17 0700) Weight:  [68 kg-71.1 kg] 71.1 kg (04/17 0500)    Weight change: Filed Weights   08/24/18 2101 08/25/18 0500  Weight: 68 kg 71.1 kg    Intake/Output:   Intake/Output Summary (Last 24 hours) at 08/25/2018 0811 Last data filed at 08/25/2018 0700 Gross per 24 hour  Intake 2223.29 ml  Output 800 ml  Net 1423.29 ml     Physical Exam: General:  Well appearing. No resp difficulty HEENT: normal Neck: supple. JVP 7 . Carotids 2+ bilat; no bruits. No lymphadenopathy or thryomegaly appreciated. Cor: PMI nondisplaced. Irregular No rubs, gallops or murmurs. Lungs: clear Abdomen: soft, nontender, nondistended. No hepatosplenomegaly. No bruits or masses. Good bowel sounds. Extremities: no cyanosis, clubbing, rash, edema Neuro: alert & orientedx3, cranial nerves grossly intact. moves all 4 extremities w/o difficulty. Affect pleasant  Telemetry: Apparent atrial bigeminy Personally reviewed    Labs: Basic Metabolic Panel: Recent Labs  Lab 08/24/18 2104 08/24/18 2107 08/24/18 2108 08/25/18 0417 08/25/18 0500  NA 139 140  --   --  139  K 3.9 3.8  --   --  4.4  CL 106  --   --   --  105  CO2 19*  --   --   --  27  GLUCOSE 119*  --   --   --  133*  BUN 21*  --   --   --  18  CREATININE 1.25*  --  1.20* 0.85 0.88  CALCIUM 8.8*  --   --   --  8.4*    Liver Function Tests: Recent Labs  Lab 08/24/18 2104  AST 32  ALT 30  ALKPHOS 69  BILITOT 0.4  PROT 6.2*   ALBUMIN 3.9   No results for input(s): LIPASE, AMYLASE in the last 168 hours. No results for input(s): AMMONIA in the last 168 hours.  CBC: Recent Labs  Lab 08/24/18 2104 08/24/18 2107 08/25/18 0417  WBC 13.9*  --  10.3  NEUTROABS 8.8*  --   --   HGB 11.9* 12.6 9.5*  HCT 37.2 37.0 30.8*  MCV 103.9*  --  103.0*  PLT 324  --  265    Cardiac Enzymes: Recent Labs  Lab 08/24/18 2104 08/25/18 0417  TROPONINI <0.03 <0.03    BNP: BNP (last 3 results) No results for input(s): BNP in the last 8760 hours.  ProBNP (last 3 results) No results for input(s): PROBNP in the last 8760 hours.    Other results:  Imaging: Dg Chest Portable 1 View  Result Date: 08/24/2018 CLINICAL DATA:  Sudden onset of shortness of breath EXAM: PORTABLE CHEST 1 VIEW COMPARISON:  09/06/16 FINDINGS: The heart size and mediastinal contours are within normal limits. Both lungs are clear. The visualized skeletal structures are unremarkable. IMPRESSION: No active disease. Electronically Signed   By: Alcide Clever M.D.   On: 08/24/2018 21:35      Medications:  Scheduled Medications: . atorvastatin  20 mg Oral q1800  . Chlorhexidine Gluconate Cloth  6 each Topical Daily  . DULoxetine  60 mg Oral BID  . gabapentin  800 mg Oral TID  . heparin  5,000 Units Subcutaneous Q8H  . multivitamin with minerals  1 tablet Oral Daily  . [START ON 08/27/2018] Semaglutide (1 MG/DOSE)  1 mg Subcutaneous Q Sun  . sodium chloride flush  3 mL Intravenous Q12H  . varenicline  1 mg Oral BID     Infusions: . sodium chloride    . lactated ringers 100 mL/hr at 08/25/18 0700     PRN Medications:  sodium chloride, acetaminophen, sodium chloride flush   Assessment:   Brittney Sawyer is a 53 y.o. female w/ history of HTN, depression, DM2, chronic pain presenting with syncope in setting of VT.   Plan/Discussion:    1. VT - initial rhythm very wide VT. Self-converted - subsequently developed CHB - now appears to  be in atrial bigeminy with low volts (will repeat ECG) - will get echo. - will likely need cMRI to exclude infiltrative disease and probable cath - check mag - EP to see  2. HTN - currently well controlled  3. DM2 - cover with SSI  4. Chronic pain  - takes Kratom (herbal supplement) at home will cover with tramadol here    Length of Stay: 1   Arvilla Meres MD 08/25/2018, 8:11 AM  Advanced Heart Failure Team Pager [phone] (M-F; 7a - 4p)  Please contact [facility] for night-coverage after hours (4p -7a ) and weekends on [email or web address]

## 2018-08-25 NOTE — Progress Notes (Addendum)
Advanced Heart Failure Rounding Note   Subjective:    No further VT or syncope.  Denies antecedent CP. No family h/o SCD.   No mag level on chart. Denies IVDA or symptoms of endocarditis    Objective:   Weight Range:  Vital Signs:   Temp:  [96.7 F (35.9 C)-98.4 F (36.9 C)] 97.7 F (36.5 C) (04/17 0700) Pulse Rate:  [32-137] 38 (04/17 0645) Resp:  [11-31] 19 (04/17 0700) BP: (73-116)/(43-78) 91/65 (04/17 0700) SpO2:  [91 %-100 %] 96 % (04/17 0645) Arterial Line BP: (82-138)/(36-84) 107/61 (04/17 0700) Weight:  [68 kg-71.1 kg] 71.1 kg (04/17 0500)    Weight change: Filed Weights   08/24/18 2101 08/25/18 0500  Weight: 68 kg 71.1 kg    Intake/Output:   Intake/Output Summary (Last 24 hours) at 08/25/2018 0811 Last data filed at 08/25/2018 0700 Gross per 24 hour  Intake 2223.29 ml  Output 800 ml  Net 1423.29 ml     Physical Exam: General:  Well appearing. No resp difficulty HEENT: normal Neck: supple. JVP 7 . Carotids 2+ bilat; no bruits. No lymphadenopathy or thryomegaly appreciated. Cor: PMI nondisplaced. Irregular No rubs, gallops or murmurs. Lungs: clear Abdomen: soft, nontender, nondistended. No hepatosplenomegaly. No bruits or masses. Good bowel sounds. Extremities: no cyanosis, clubbing, rash, edema Neuro: alert & orientedx3, cranial nerves grossly intact. moves all 4 extremities w/o difficulty. Affect pleasant  Telemetry: Apparent atrial bigeminy Personally reviewed    Labs: Basic Metabolic Panel: Recent Labs  Lab 08/24/18 2104 08/24/18 2107 08/24/18 2108 08/25/18 0417 08/25/18 0500  NA 139 140  --   --  139  K 3.9 3.8  --   --  4.4  CL 106  --   --   --  105  CO2 19*  --   --   --  27  GLUCOSE 119*  --   --   --  133*  BUN 21*  --   --   --  18  CREATININE 1.25*  --  1.20* 0.85 0.88  CALCIUM 8.8*  --   --   --  8.4*    Liver Function Tests: Recent Labs  Lab 08/24/18 2104  AST 32  ALT 30  ALKPHOS 69  BILITOT 0.4  PROT 6.2*   ALBUMIN 3.9   No results for input(s): LIPASE, AMYLASE in the last 168 hours. No results for input(s): AMMONIA in the last 168 hours.  CBC: Recent Labs  Lab 08/24/18 2104 08/24/18 2107 08/25/18 0417  WBC 13.9*  --  10.3  NEUTROABS 8.8*  --   --   HGB 11.9* 12.6 9.5*  HCT 37.2 37.0 30.8*  MCV 103.9*  --  103.0*  PLT 324  --  265    Cardiac Enzymes: Recent Labs  Lab 08/24/18 2104 08/25/18 0417  TROPONINI <0.03 <0.03    BNP: BNP (last 3 results) No results for input(s): BNP in the last 8760 hours.  ProBNP (last 3 results) No results for input(s): PROBNP in the last 8760 hours.    Other results:  Imaging: Dg Chest Portable 1 View  Result Date: 08/24/2018 CLINICAL DATA:  Sudden onset of shortness of breath EXAM: PORTABLE CHEST 1 VIEW COMPARISON:  09/06/16 FINDINGS: The heart size and mediastinal contours are within normal limits. Both lungs are clear. The visualized skeletal structures are unremarkable. IMPRESSION: No active disease. Electronically Signed   By: Alcide Clever M.D.   On: 08/24/2018 21:35      Medications:  Scheduled Medications: . atorvastatin  20 mg Oral q1800  . Chlorhexidine Gluconate Cloth  6 each Topical Daily  . DULoxetine  60 mg Oral BID  . gabapentin  800 mg Oral TID  . heparin  5,000 Units Subcutaneous Q8H  . multivitamin with minerals  1 tablet Oral Daily  . [START ON 08/27/2018] Semaglutide (1 MG/DOSE)  1 mg Subcutaneous Q Sun  . sodium chloride flush  3 mL Intravenous Q12H  . varenicline  1 mg Oral BID     Infusions: . sodium chloride    . lactated ringers 100 mL/hr at 08/25/18 0700     PRN Medications:  sodium chloride, acetaminophen, sodium chloride flush   Assessment:   Brittney Sawyer is a 53 y.o. female w/ history of HTN, depression, DM2, chronic pain presenting with syncope in setting of VT.   Plan/Discussion:    1. VT - initial rhythm very wide VT. Self-converted - subsequently developed CHB - now appears to  be in atrial bigeminy with low volts (will repeat ECG) - will get echo. - will likely need cMRI to exclude infiltrative disease and probable cath - check mag - EP to see  2. HTN - currently well controlled  3. DM2 - cover with SSI  4. Chronic pain  - takes Kratom (herbal supplement) at home will cover with tramadol here    Length of Stay: 1   [name] 08/25/2018, 8:11 AM  Advanced Heart Failure Team Pager [phone] (M-F; 7a - 4p)  Please contact [facility] for night-coverage after hours (4p -7a ) and weekends on [email or web address]  

## 2018-08-25 NOTE — Consult Note (Signed)
Cardiology Consultation:   Patient ID: Remi DeterLaura C Taite MRN: [id number]; DOB: [birth year]  Admit date: 08/24/2018 Date of Consult: 08/25/2018  Primary Care Provider: Maudie FlakesAnderson, Shane D, FNP Primary Cardiologist: None Primary Electrophysiologist:  None    Patient Profile:   Remi DeterLaura C Vondra is a 53 y.o. female with a hx of DM, obesity, smoker (on/off of late appears, Rx Chantix at last PCP visit in Jan), HLD, and [facility]), Depression (on duloxetine) who is being seen today for the evaluation of syncope/VT at the request of Dr. Gala RomneyBensimhon.  History of Present Illness:   Ms. Victory DakinRiley was in her USOH (whic includes regular daily exercise) when she suffered a syncopal event at home, in review of record, she initially felt weak, shaky and then fainted, woke on the floor, noted her husband on the phone with 911.    [facility] with andenosine or amiodarone (2, 150mg  boluses), [facility], hypotensive, treated with IVF, trendelenburg, labs were significant for mild acidemia with a bicarb of 19, BUN 21, creatinine 1.25.  K+ 4.1   She had spontaneous conversion to SR.  She is ordered for and pending TTE and cardiac MRI   no family hx sudden death  no syncope hx  She is afebrile, intermittently bradycardic of unclear rhythm, BP is much improved  LABS K+ 4.1 > 3.9 > 3.8 > 4.4 BUN/Creat 20/0.81 > 18/0.88 WBC 11.1 > 13.9 > 10.3  H/H 9.7/30.3 > 9.5/30.8 plts 266  Trop I: <0.03 x2   Past Medical History:  Diagnosis Date  . Anxiety   . Arthritis    "all my joints" (06/20/2017)  . Depression   . Diabetes mellitus type 2, controlled (HCC) 05/20/2014  . GERD (gastroesophageal reflux disease)    "gone w/diet revision" (06/20/2017)  . Hyperlipidemia   . Hypertension   . Infection    right knee replacement  . Neuropathy    post back surgery  . Pneumonia X 1  . Poorly controlled diabetes mellitus (HCC) 07/26/2017  . Right knee DJD  01/27/2013  . Spinal headache   . Substance abuse (HCC)    Alcohol - sober since 1996    Past Surgical History:  Procedure Laterality Date  . AMPUTATION Right 10/03/2015   Procedure: Right Foot 3rd Ray Amputation;  Surgeon: Nadara MustardMarcus Duda V, MD;  [facility];  Service: Orthopedics;  Laterality: Right;  . BACK SURGERY    . BUNIONECTOMY WITH HAMMERTOE RECONSTRUCTION Bilateral   . EXCISIONAL TOTAL KNEE ARTHROPLASTY WITH ANTIBIOTIC SPACERS Right 06/20/2017  . EXCISIONAL TOTAL KNEE ARTHROPLASTY WITH ANTIBIOTIC SPACERS Right 06/20/2017   Procedure: EXCISION RIGHT TOTAL KNEE ARTHROPLASTY WITH ANTIBIOTIC SPACERS;  Surgeon: Dannielle HuhLucey, Steve, MD;  Location: [facility] OR;  Service: Orthopedics;  Laterality: Right;  . JOINT REPLACEMENT    . POSTERIOR LUMBAR FUSION  11/2007   L4, L5   . POSTERIOR LUMBAR FUSION  01/2008     second surgery L2-L5 and S1  . SHOULDER ARTHROSCOPY W/ ROTATOR CUFF REPAIR Left 01/2017  . TOTAL KNEE ARTHROPLASTY Right 01/26/2013   Procedure: TOTAL KNEE ARTHROPLASTY;  Surgeon: Thera FlakeW D Caffrey Jr., MD;  Location: [facility] OR;  Service: Orthopedics;  Laterality: Right;  . TOTAL KNEE REVISION Right 08/22/2017   Procedure: RIGHT TOTAL KNEE REVISION WUITH REMOVAL OF ANTIBIOTIC SPACER;  Surgeon: Dannielle HuhLucey, Steve, MD;  Location: [facility] OR;  Service: Orthopedics;  Laterality: Right;  . TUBAL LIGATION    . VAGINAL HYSTERECTOMY  Home Medications:  Prior to Admission medications   Medication Sig Start Date End Date Taking? Authorizing Provider  acetaminophen (TYLENOL) 500 MG tablet Take 2 tablets (1,000 mg total) by mouth every 8 (eight) hours as needed. Patient taking differently: Take 500-1,000 mg by mouth every 8 (eight) hours as needed (for pain or headaches).  08/26/16  Yes Sherren Mocha, MD  aspirin EC 81 MG tablet Take 81 mg by mouth daily.   Yes [provider]  atorvastatin (LIPITOR) 20 MG tablet Take 20 mg by mouth daily. 08/19/18  Yes [provider]  B Complex-C (B-COMPLEX WITH  VITAMIN C) tablet Take 1 tablet by mouth daily.   Yes [provider]  diphenhydrAMINE (BENADRYL) 25 mg capsule Take 25-50 mg by mouth at bedtime.    Yes [provider]  DULoxetine (CYMBALTA) 60 MG capsule TAKE ONE CAPSULE BY MOUTH TWICE A DAY **MUST CALL MD FOR APPOINTMENT Patient taking differently: Take 60 mg by mouth 2 (two) times daily.  07/11/17  Yes English, Judeth Cornfield D, PA  gabapentin (NEURONTIN) 800 MG tablet Take 1 tablet (800 mg total) by mouth 4 (four) times daily. Office visit needed Patient taking differently: Take 800 mg by mouth 4 (four) times daily.  05/02/17  Yes English, Judeth Cornfield D, PA  ibuprofen (ADVIL) 200 MG tablet Take 600-800 mg by mouth every 6 (six) hours as needed (for pain).   Yes [provider]  lisinopril-hydrochlorothiazide (PRINZIDE,ZESTORETIC) 20-25 MG tablet TAKE ONE TABLET BY MOUTH DAILY Patient taking differently: Take 0.5 tablets by mouth daily.  09/03/16  Yes Sherren Mocha, MD  metFORMIN (GLUCOPHAGE) 500 MG tablet Take 1 tablet (500 mg total) by mouth 2 (two) times daily with a meal. Patient taking differently: Take 500 mg by mouth at bedtime.  07/22/17  Yes Ghimire, Werner Lean, MD  Multiple Vitamin (MULTIVITAMIN) tablet Take 1 tablet by mouth daily.   Yes [provider]  ondansetron (ZOFRAN-ODT) 4 MG disintegrating tablet Take 4 mg by mouth every 6 (six) hours as needed for nausea.  07/07/18  Yes [provider]  OZEMPIC, 1 MG/DOSE, 2 MG/1.5ML SOPN Inject 1 mg into the skin every Sunday.  08/16/18  Yes [provider]  traZODone (DESYREL) 100 MG tablet TAKE ONE TABLET BY MOUTH THREE TIMES A DAY -- NEED OFFICE VISIT Patient taking differently: Take 200-300 mg by mouth at bedtime.  01/20/17  Yes Ethelda Chick, MD  varenicline (CHANTIX) 1 MG tablet Take 1 mg by mouth 2 (two) times daily.   Yes [provider]  acyclovir (ZOVIRAX) 200 MG capsule TAKE TWO CAPSULES BY MOUTH THREE TIMES A DAY FOR 5 DAYS AS  NEEDED FOR OUTBREAK Patient not taking: Reported on 08/03/2017 11/13/16   Trena Platt D, PA  aspirin EC 325 MG EC tablet Take 1 tablet (325 mg total) by mouth 2 (two) times daily. Patient not taking: Reported on 08/24/2018 08/23/17   Guy Sandifer, PA  HYDROmorphone (DILAUDID) 2 MG tablet Take 1 tablet (2 mg total) by mouth every 4 (four) hours as needed (breakthrough post-op pain). Patient not taking: Reported on 08/24/2018 08/23/17   Guy Sandifer, PA  insulin aspart (NOVOLOG FLEXPEN) 100 UNIT/ML FlexPen Inject 8 Units into the skin 3 (three) times daily with meals. Patient not taking: Reported on 08/24/2018 07/22/17   Maretta Bees, MD  Insulin Glargine (LANTUS) 100 UNIT/ML Solostar Pen Inject 25 Units into the skin daily at 10 pm. Patient not taking: Reported on 08/24/2018 07/22/17  Maretta Bees, MD  Insulin Pen Needle 32G X 8 MM MISC Use as directed Patient not taking: Reported on 08/24/2018 07/22/17   Maretta Bees, MD  methocarbamol (ROBAXIN) 500 MG tablet Take 1-2 tablets (500-1,000 mg total) by mouth 4 (four) times daily. Patient not taking: Reported on 08/24/2018 08/23/17   Guy Sandifer, PA  oxyCODONE 10 MG TABS Take 1 tablet (10 mg total) by mouth every 4 (four) hours as needed for moderate pain or severe pain (pain score 7-10). Patient not taking: Reported on 08/24/2018 08/23/17   Guy Sandifer, [place]    Inpatient Medications: Scheduled Meds: . atorvastatin  20 mg Oral q1800  . Chlorhexidine Gluconate Cloth  6 each Topical Daily  . DULoxetine  60 mg Oral BID  . gabapentin  800 mg Oral TID  . heparin  5,000 Units Subcutaneous Q8H  . insulin aspart  0-15 Units Subcutaneous TID WC  . multivitamin with minerals  1 tablet Oral Daily  . [START ON 08/27/2018] Semaglutide (1 MG/DOSE)  1 mg Subcutaneous Q Sun  . sodium chloride flush  3 mL Intravenous Q12H  . varenicline  1 mg Oral BID   Continuous Infusions: . sodium chloride    . lactated ringers 100  mL/hr at 08/25/18 0700   PRN Meds: sodium chloride, acetaminophen, sodium chloride flush  Allergies:    Allergies  Allergen Reactions  . Celebrex [Celecoxib] Swelling    Site of swelling unknown??  . Wellbutrin [Bupropion] Rash    Social History:   Social History   Socioeconomic History  . Marital status: Married    Spouse name: Not on file  . Number of children: Not on file  . Years of education: Not on file  . Highest education level: Not on file  Occupational History  . Not on file  Social Needs  . Financial resource strain: Not on file  . Food insecurity:    Worry: Not on file    Inability: Not on file  . Transportation needs:    Medical: Not on file    Non-medical: Not on file  Tobacco Use  . Smoking status: Current Every Day Smoker    Packs/day: 0.50    Years: 15.00    Pack years: 7.50    Types: Cigarettes  . Smokeless tobacco: Never Used  . Tobacco comment: Pt on Chantix  Substance and Sexual Activity  . Alcohol use: No    Alcohol/week: 0.0 standard drinks    Comment: Pt is a recovering alcoholic and has been sober since ~2006.  . Drug use: Yes    Types: Marijuana, Oxycodone, Hydrocodone    Comment: 2/11//2019 "nothing in the last year"  . Sexual activity: Yes    Birth control/protection: Surgical  Lifestyle  . Physical activity:    Days per week: Not on file    Minutes per session: Not on file  . Stress: Not on file  Relationships  . Social connections:    Talks on phone: Not on file    Gets together: Not on file    Attends religious service: Not on file    Active member of club or organization: Not on file    Attends meetings of clubs or organizations: Not on file    Relationship status: Not on file  . Intimate partner violence:    Fear of current or ex partner: Not on file    Emotionally abused: Not on file    Physically abused: Not on file  Forced sexual activity: Not on file  Other Topics Concern  . Not on file  Social History  Narrative  . Not on file    Family History:   No sudden death Family History  Problem Relation Age of Onset  . Thyroid disease Mother   . Hypertension Father   . Anxiety disorder Sister   . Cancer Paternal Grandmother        prostate     ROS:  Please see the history of present illness.   All other ROS reviewed and negative.     Physical Exam/Data:   Vitals:   08/25/18 0615 08/25/18 0630 08/25/18 0645 08/25/18 0700  BP:  (!) 101/52  91/65  Pulse: (!) 40 (!) 41 (!) 38   Resp: 17 (!) Temp:    97.7 F (36.5 C)  TempSrc:    Oral  SpO2: 98% 95% 96%   Weight:      Height:        Intake/Output Summary (Last 24 hours) at 08/25/2018 0822 Last data filed at 08/25/2018 0700 Gross per 24 hour  Intake 2223.29 ml  Output 800 ml  Net 1423.29 ml   Last 3 Weights 08/25/2018 08/24/2018 08/22/2017  Weight (lbs) 156 lb 12 oz 150 lb 182 lb  Weight (kg) 71.1 kg 68.04 kg 82.555 kg     Body mass index is 30.61 kg/m.  General:  Well nourished, well developed, in no acute distress HEENT: normal Lymph: no adenopathy Neck: no JVD Endocrine:  No thryomegaly Vascular: No carotid bruits; FA pulses 2+ bilaterally without bruits  Cardiac:  normal S1, S2; IRRR; no murmur  Lungs:  clear to auscultation bilaterally, no wheezing, rhonchi or rales  Abd: soft, nontender, no hepatomegaly  Ext: no edema Musculoskeletal:  No deformities, BUE and BLE strength normal and equal Skin: warm and dry  Neuro:  CNs 2-12 intact, no focal abnormalities noted Psych:  Normal affect   EKG:  The EKG was personally reviewed and demonstrates:   #1 WCT 143 #2 WCT 136 #3 looks SR, 1s degree AVBlock, IVCD, QT looks OK, PACs  Telemetry:  Telemetry was personally reviewed and demonstrates:   In the ER, she eventually eveloped a number of morphologies >> PMVT >> then spontaneously converted to what lookes an accelerated junctional/SR She since has had brady rates 40's that appear to be ectopic/junctional  rhythm, and rates 70's taht are clearly SR, 1st degree AVblock  Relevant CV Studies:  No historical cardiac data  Laboratory Data:  Chemistry Recent Labs  Lab 08/24/18 2104 08/24/18 2107 08/24/18 2108 08/25/18 0417 08/25/18 0500  NA 139 140  --   --  139  K 3.9 3.8  --   --  4.4  CL 106  --   --   --  105  CO2 19*  --   --   --  27  GLUCOSE 119*  --   --   --  133*  BUN 21*  --   --   --  18  CREATININE 1.25*  --  1.20* 0.85 0.88  CALCIUM 8.8*  --   --   --  8.4*  GFRNONAA 49*  --   --  >60 >60  GFRAA 57*  --   --  >60 >60  ANIONGAP 14  --   --   --  7    Recent Labs  Lab 08/24/18 2104  PROT 6.2*  ALBUMIN 3.9  AST 32  ALT 30  ALKPHOS 69  BILITOT 0.4   Hematology Recent Labs  Lab 08/24/18 2104 08/24/18 2107 08/25/18 0417  WBC 13.9*  --  10.3  RBC 3.58*  --  2.99*  HGB 11.9* 12.6 9.5*  HCT 37.2 37.0 30.8*  MCV 103.9*  --  103.0*  MCH 33.2  --  31.8  MCHC 32.0  --  30.8  RDW 12.4  --  12.5  PLT 324  --  265   Cardiac Enzymes Recent Labs  Lab 08/24/18 2104 08/25/18 0417  TROPONINI <0.03 <0.03   No results for input(s): TROPIPOC in the last 168 hours.  BNPNo results for input(s): BNP, PROBNP in the last 168 hours.  DDimer No results for input(s): DDIMER in the last 168 hours.  Radiology/Studies:   Dg Chest Portable 1 View Result Date: 08/24/2018 CLINICAL DATA:  Sudden onset of shortness of breath EXAM: PORTABLE CHEST 1 VIEW COMPARISON:  09/06/16 FINDINGS: The heart size and mediastinal contours are within normal limits. Both lungs are clear. The visualized skeletal structures are unremarkable. IMPRESSION: No active disease. Electronically Signed   By: Alcide Clever M.D.   On: 08/24/2018 21:35    Assessment and Plan:   1. Syncope 2. WCT, VT     Neg Trop x2     Check mag (though got 2g in ER)     pending TTE and c.MRI  Not currently on any pressors or AAD  Old EKGs reviewed, normal intervals  EMS record reviewed, tracing look the same as here,  no narrative report available yet though vitals report patient AAO.  For questions or updates, please contact [facility] Please consult [email or web address] for contact info under  Signed, Sheilah Pigeon, PA-C  08/25/2018 8:22 AM   EP Attending  Patient seen and examined. Agree with the findings as noted above with modification. The patient's findings have also been discussed with Dr. Dorthea Cove and Dr. Crissie Sickles. She has had syncope and multiple VT morphologies including polymorphic VT and a couple of different monomorphic VT's. In addition she has evidence of conduction system disease which precludes this admit. Also she has taken Kratom, which is a opioid herbal supplement which is purported to have potassium channel blocking properties. Her QT is not prolonged. A 2D echo has been done which demonstrates LV dysfunction but was described as not in a distribution of CAD. A cardiac MR is pending. She does not have any objective or subjective evidence of ischemia but with LV dysfunction and longstanding DM, I think we should rule out obstructive CAD. Finally, a DDD ICD is warranted unless she has severe 3 vessel disease or LM disease which case revascularization would be recommended first.  Leonia Reeves.D.

## 2018-08-25 NOTE — Interval H&P Note (Signed)
History and Physical Interval Note:  08/25/2018 2:42 PM  Brittney Sawyer  has presented today for surgery, with the diagnosis of ventricular tachycardia.  The various methods of treatment have been discussed with the patient and family. After consideration of risks, benefits and other options for treatment, the patient has consented to  Procedure(s): LEFT HEART CATH AND CORONARY ANGIOGRAPHY (N/A) and possible coronary angioplasty as a surgical intervention.  The patient's history has been reviewed, patient examined, no change in status, stable for surgery.  I have reviewed the patient's chart and labs.  Questions were answered to the patient's satisfaction.     Daniel Bensimhon

## 2018-08-26 ENCOUNTER — Other Ambulatory Visit: Payer: Self-pay

## 2018-08-26 ENCOUNTER — Inpatient Hospital Stay (HOSPITAL_COMMUNITY): Payer: Commercial Managed Care - PPO

## 2018-08-26 LAB — BASIC METABOLIC PANEL
Anion gap: 12 (ref 5–15)
Anion gap: 12 (ref 5–15)
BUN: 14 mg/dL (ref 6–20)
BUN: 13 mg/dL (ref 6–20)
CO2: 24 mmol/L (ref 22–32)
CO2: 25 mmol/L (ref 22–32)
Calcium: 8.9 mg/dL (ref 8.9–10.3)
Calcium: 9.1 mg/dL (ref 8.9–10.3)
Chloride: 101 mmol/L (ref 98–111)
Chloride: 101 mmol/L (ref 98–111)
Creatinine, Ser: 0.79 mg/dL (ref 0.44–1.00)
Creatinine, Ser: 0.75 mg/dL (ref 0.44–1.00)
GFR calc Af Amer: >60 mL/min (ref >60)
GFR calc Af Amer: >60 mL/min (ref >60)
GFR calc non Af Amer: >60 mL/min (ref >60)
GFR calc non Af Amer: >60 mL/min (ref >60)
Glucose, Bld: 86 mg/dL (ref 70–99)
Glucose, Bld: 116 mg/dL — ABNORMAL HIGH (ref 70–99)
Potassium: 7.3 mmol/L (ref 3.5–5.1)
Potassium: 3.8 mmol/L (ref 3.5–5.1)
Sodium: 137 mmol/L (ref 135–145)
Sodium: 138 mmol/L (ref 135–145)

## 2018-08-26 LAB — ANGIOTENSIN CONVERTING ENZYME: Angiotensin-Converting Enzyme: 6 U/L — ABNORMAL LOW (ref 14–82)

## 2018-08-26 LAB — GLUCOSE, CAPILLARY: Glucose-Capillary: 92 mg/dL (ref 70–99)

## 2018-08-26 MED ORDER — HYDROMORPHONE HCL 2 MG PO TABS: 2.0000 mg | ORAL_TABLET | Freq: Four times a day (QID) | ORAL | 0 refills | Status: DC | PRN

## 2018-08-26 MED ORDER — OXYCODONE HCL 5 MG PO TABS
5.0000 mg | ORAL_TABLET | Freq: Once | ORAL | Status: AC
  Administered 2018-08-26: 5 mg via ORAL
  Filled 2018-08-26: qty 1

## 2018-08-26 MED ORDER — LORAZEPAM 1 MG PO TABS
1.0000 mg | ORAL_TABLET | Freq: Once | ORAL | Status: AC
  Administered 2018-08-26: 1 mg via ORAL
  Filled 2018-08-26: qty 1

## 2018-08-26 MED ORDER — CALCIUM GLUCONATE-NACL 2-0.675 GM/100ML-% IV SOLN
2.0000 g | Freq: Once | INTRAVENOUS | Status: AC
  Administered 2018-08-26: 2000 mg via INTRAVENOUS
  Filled 2018-08-26: qty 100

## 2018-08-26 MED ORDER — METOPROLOL SUCCINATE ER 25 MG PO TB24: 25.0000 mg | ORAL_TABLET | Freq: Every day | ORAL | 11 refills | Status: DC

## 2018-08-26 MED ORDER — MORPHINE SULFATE (PF) 2 MG/ML IV SOLN
2.0000 mg | Freq: Once | INTRAVENOUS | Status: AC
  Administered 2018-08-26: 06:00:00 2 mg via INTRAVENOUS
  Filled 2018-08-26: qty 1

## 2018-08-26 NOTE — Progress Notes (Signed)
Patient very anxious and restless stating she felt like she was going to "crawl out of her skin".  Pt also complains still of pain in shoulder from ICD placement.  Cardiology paged.  Orders for oxycodone 5mg  PO and ativan 1mg  PO given.

## 2018-08-26 NOTE — Progress Notes (Signed)
Patient does not want to leave sling on arm.  Patient educated on proper position to keep arm and not to lift it or turn to affected side.  Patient has been compliant so far.

## 2018-08-26 NOTE — Progress Notes (Signed)
Critical potassium of 7.2.  RN paged cardiology.  Order for potassium redraw and calcium gluconate. Order for repeat EKG

## 2018-08-26 NOTE — Progress Notes (Signed)
Dinamap set up for patient frequent vitals after returning from ICD placement.  Vital signs were taken every 15 minutes.  Once validated, VS disappeared and were not able to be recovered.  RN witnessed several vital signs which BP was in 130s/70s, HR in 70s, and O2sat above 95%

## 2018-08-26 NOTE — Progress Notes (Signed)
EKG complete.

## 2018-08-26 NOTE — Discharge Summary (Addendum)
Discharge Summary    Patient ID: JOLEEN STUCKERT MRN: [id number]; DOB: 03/06/1966  Admit date: 08/24/2018 Discharge date: 08/26/2018  Primary Care Provider: [name], FNP  Primary Cardiologist/Primary Electrophysiologist:  Dr.Ameyah Bangura  Discharge Diagnoses    Active Problems:   VT (ventricular tachycardia) (HCC)   Acute systolic CHF   Chest pain   Allergies Allergies  Allergen Reactions  . Celebrex [Celecoxib] Swelling    Site of swelling unknown??  . Wellbutrin [Bupropion] Rash    Diagnostic Studies/Procedures    Echo 08/25/27 IMPRESSIONS    1. The left ventricle has mild-moderately reduced systolic function, with an ejection fraction of 40-45%. The cavity size was normal. There is concentric left ventricular hypertrophy. Left ventricular diastolic parameters were normal.  2. The right ventricle has normal systolic function. The cavity was mildly enlarged. There is no increase in right ventricular wall thickness.  3. The mitral valve is grossly normal.  4. The tricuspid valve is grossly normal.  5. The aortic valve is tricuspid. Mild thickening of the aortic valve. Mild calcification of the aortic valve. Aortic valve regurgitation is mild by color flow Doppler. No stenosis of the aortic valve.  6. The interatrial septum was not assessed.  SUMMARY   No prior echocardiogram available for comparison. Estimated LVEF 40-45%. There is akinesis of the mid inferoseptal, anteroseptal and apical inferior, septal and anterior walls. No thrombus is seen in the let ventricular apex.  LEFT HEART CATH AND CORONARY ANGIOGRAPHY 08/25/18  Conclusion     Mid LAD to Dist LAD lesion is 30% stenosed.   Assessment & Plan:  1. Very mild plaque in distal LAD 2.  EF 45-50% 3.  LVEDP 24      ICD IMPLANT  Conclusion    CONCLUSIONS:   1. Ischemic cardiomyopathy with chronic New York Heart Association class III heart failure.   2. Successful ICD implantation.   3. DFT  could not be determined due to inability to induce   4. No inducible VT or VF with PES  5. No early apparent complications.   ICD Criteria Current LVEF:40%. Within 12 months prior to implant: Yes  Heart failure history: No Cardiomyopathy history: Yes, Non-Ischemic Cardiomyopathy. Atrial Fibrillation/Atrial Flutter: Yes, Paroxysmal. Ventricular tachycardia history: Yes, Hemodynamic instability present. VT Type: Sustained Ventricular Tachycardia - Monomorphic and Polymorphic. Cardiac arrest history: No. History of syndromes with risk of sudden death: No. Previous ICD: No. Current ICD indication: Secondary PPM indication: No.               Class I or II Bradycardia indication present: No Beta Blocker therapy for 3 or more months: No, patient reason. Ace Inhibitor/ARB therapy for 3 or more months: No, patient reason.   Lewayne Bunting, MD  5:51 PM 08/25/2018    Cardiac MRI 08/25/18 IMPRESSION: 1. Normal left ventricular size, thickness and moderately decreased systolic function (LVEF = 41%). Dyskinesis of the mid inferoseptal, anteroseptal and apical anterior, septal and inferior walls.  There is no late gadolinium enhancement in the left ventricular myocardium.  2. Normal right ventricular size, thickness and systolic function (LVEF = 49%). There are no regional wall motion abnormalities.  3.  Mildly dilated left atrium (41 mm). Normal right atrial size.  4. Normal size of the aortic root, ascending aorta and pulmonary artery.  5.  Mild aortic and mitral and trivial tricuspid regurgitation.  6.  Normal pericardium.  Trivial pericardial effusion.  There is no evidence for an infiltrative cardiomyopathy or arrhythmogenic right ventricular cardiomyopathy.  Pattern of wall motion abnormalities - apical ballooning suggestive of a possible stress induced cardiomyopathy or hibernating myocardium with severe LAD stenosis. A cardiac cath is recommended.  History of  Present Illness     DANUTA HUSEMAN is a 53 y.o. female w/ history of HTN, depression, DM2 presenting with syncope and found to have VT.   Ms. Hreha was in her USOH (whic includes regular daily exercise) when she suffered a syncopal event at home, in review of record, she initially felt weak, shaky and then fainted, woke on the floor, noted her husband on the phone with 911.    EMS found her in [facility] that was unchanged with andenosine or amiodarone (2,  boluses), she was in [facility] on arrival to ER, hypotensive, treated with IVF, trendelenburg, labs were significant for mild acidemia with a bicarb of 19, BUN 21, creatinine 1.25.  K+ 4.1   She had spontaneous conversion to SR.   Hospital Course     Consultants: EP  1. VT - She has had no recurrent symptoms. Seen by EP. Echo showed LVEF of 40-45%. Electrolytes were stable. Cardiac MRI - There is no evidence for an infiltrative cardiomyopathy or arrhythmogenic right ventricular cardiomyopathy. Pattern of wall motion abnormalities - apical ballooning suggestive of a possible stress induced cardiomyopathy or hibernating myocardium with severe LAD stenosis. Cath with very mild plaque in distal LAD. S/p ICD. Start BB.   2. LV dysfunction/Systolic CHF - She does not have CHF symptoms. Start Toprol  daily. Resume home Lisinopril/HCTX.   3. Chest pain - Resolved. She still has some incisional pain and request dilaudid. She will be prescribed 10 pills with no refill. Dr. Ladona Ridgel warned her that if her chest pain came back and if associated with deep breathing, she would need to call or come back to ED. Cannot exclude microperforation but with good numbers, ECG without change, resolution of symptoms, Plan treat conservatively for now.   4. ICD insertion - her DDD ICD interogated normal. no pneumothorax on CXR.   The patient been seen by Dr. Ladona Ridgel today and deemed ready for discharge home. All follow-up appointments have been scheduled. Discharge  medications are listed below.   Discharge Vitals Blood pressure (!) 141/75, pulse 77, temperature 98.1 F (36.7 C), temperature source Oral, resp. rate (!) 22, height 5' (1.524 m), weight 75 kg, SpO2 97 %.  Filed Weights   08/25/18 0500 08/25/18 1249 08/26/18 0433  Weight: 71.1 kg 71.1 kg 75 kg    Labs & Radiologic Studies    CBC Recent Labs    08/24/18 2104 08/24/18 2107 08/25/18 0417  WBC 13.9*  --  10.3  NEUTROABS 8.8*  --   --   HGB 11.9* 12.6 9.5*  HCT 37.2 37.0 30.8*  MCV 103.9*  --  103.0*  PLT 324  --  265   Basic Metabolic Panel Recent Labs    16/10/96 0500 08/26/18 0505 08/26/18 0730  NA 139 137 138  K 4.4 7.3* 3.8  CL 105 101 101  CO2 GLUCOSE 133* 86 116*  BUN CREATININE 0.88 0.79 0.75  CALCIUM 8.4* 8.9 9.1  MG 2.0  --   --    Liver Function Tests Recent Labs    08/24/18 2104  AST 32  ALT 30  ALKPHOS 69  BILITOT 0.4  PROT 6.2*  ALBUMIN 3.9   No results for input(s): LIPASE, AMYLASE in the last 72 hours. Cardiac Enzymes Recent Labs  08/24/18 2104 08/25/18 0417  TROPONINI <0.03 <0.03    _____________  Dg Chest 2 View  Result Date: 08/26/2018 CLINICAL DATA:  53 year old female unable status post placement of implantable cardioverter-defibrillator. EXAM: CHEST - 2 VIEW COMPARISON:  Preoperative chest x-ray 08/24/2018 FINDINGS: Interval placement of a left subclavian dual lead cardiac rhythm maintenance device. Leads project over the right atrium and right ventricle. The cardiac and mediastinal contours remain within normal limits. No evidence of pneumothorax or large pleural effusion. Inspiratory volumes are slightly low. Surgical changes of prior distal left clavicular resection. Mild degenerative osteoarthritis at the right acromioclavicular joint. No acute osseous abnormality. IMPRESSION: 1. Interval placement of a left subclavian approach cardiac rhythm maintenance device without evidence of immediate complication. 2.  Slightly low inspiratory volumes. Electronically Signed   By: Malachy Moan M.D.   On: 08/26/2018 08:17   Dg Chest Portable 1 View  Result Date: 08/24/2018 CLINICAL DATA:  Sudden onset of shortness of breath EXAM: PORTABLE CHEST 1 VIEW COMPARISON:  09/06/16 FINDINGS: The heart size and mediastinal contours are within normal limits. Both lungs are clear. The visualized skeletal structures are unremarkable. IMPRESSION: No active disease. Electronically Signed   By: Alcide Clever M.D.   On: 08/24/2018 21:35   Mr Cardiac Morphology W Wo Contrast  Result Date: 08/25/2018 CLINICAL DATA:  53 year old female admitted with symptomatic wide complex ventricular tachycardia and complete heart block. EXAM: CARDIAC MRI TECHNIQUE: The patient was scanned on a 1.5 Tesla GE magnet. A dedicated cardiac coil was used. Functional imaging was done using Fiesta sequences. 2,3, and 4 chamber views were done to assess for RWMA's. Modified Simpson's rule using a short axis stack was used to calculate an ejection fraction on a dedicated work [employer]. The patient received 8 cc of Gadavist. After 10 minutes inversion recovery sequences were used to assess for infiltration and scar tissue. CONTRAST:  8 cc  of Gadavist FINDINGS: 1. Normal left ventricular size, thickness and moderately decreased systolic function (LVEF = 41%). Dyskinesis of the mid inferoseptal, anteroseptal and apical anterior, septal and inferior walls. There is no late gadolinium enhancement in the left ventricular myocardium. LVEDD: 50 mm LVESD: 41 mm LVEDV: 155 ml LVESV: 91 ml SV: 64 ml CO: 4.5 L/min Myocardial mass: 93 g 2. Normal right ventricular size, thickness and systolic function (LVEF = 49%). There are no regional wall motion abnormalities. 3.  Mildly dilated left atrium (41 mm).  Normal right atrial size. 4. Normal size of the aortic root, ascending aorta and pulmonary artery. 5.  Mild aortic and mitral and trivial tricuspid  regurgitation. 6.  Normal pericardium.  Trivial pericardial effusion. IMPRESSION: 1. Normal left ventricular size, thickness and moderately decreased systolic function (LVEF = 41%). Dyskinesis of the mid inferoseptal, anteroseptal and apical anterior, septal and inferior walls. There is no late gadolinium enhancement in the left ventricular myocardium. 2. Normal right ventricular size, thickness and systolic function (LVEF = 49%). There are no regional wall motion abnormalities. 3.  Mildly dilated left atrium (41 mm). Normal right atrial size. 4. Normal size of the aortic root, ascending aorta and pulmonary artery. 5.  Mild aortic and mitral and trivial tricuspid regurgitation. 6.  Normal pericardium.  Trivial pericardial effusion. There is no evidence for an infiltrative cardiomyopathy or arrhythmogenic right ventricular cardiomyopathy. Pattern of wall motion abnormalities - apical ballooning suggestive of a possible stress induced cardiomyopathy or hibernating myocardium with severe LAD stenosis. A cardiac cath is recommended. Electronically Signed   By:  Tobias Alexander   On: 08/25/2018 12:50   Disposition   Pt is being discharged home today in good condition.  Follow-up Plans & Appointments    Follow-up Information    [facility] Follow up.   Specialty:  Cardiology Why:  09/04/2018 @ 11:30AM, wound check visit Contact information: [address] [place] [address] [phone]       Marinus Maw, MD Follow up.   Specialty:  Cardiology Why:  12/01/2018 @ 9:30AM Contact information: [address] [place] [address] [phone]          Discharge Instructions    Diet - low sodium heart healthy   Complete by:  As directed    Discharge instructions   Complete by:  As directed    No driving for 5 days. No lifting over 5 lbs for 1 week. No sexual activity for 1 week. You may return to work on once cleared by Dr  Ladona Ridgel. Keep procedure site clean & dry. If you notice increased pain, swelling, bleeding or pus, call/return!  You may shower, but no soaking baths/hot tubs/pools for 1 week.  Hold metformin for 2 days. Resume Monday 08/28/18.   Increase activity slowly   Complete by:  As directed       Discharge Medications   Allergies as of 08/26/2018      Reactions   Celebrex [celecoxib] Swelling   Site of swelling unknown??   Wellbutrin [bupropion] Rash      Medication List    STOP taking these medications   acyclovir 200 MG capsule Commonly known as:  ZOVIRAX   ibuprofen 200 MG tablet Commonly known as:  ADVIL     TAKE these medications   acetaminophen 500 MG tablet Commonly known as:  TYLENOL Take 2 tablets (1,000 mg total) by mouth every 8 (eight) hours as needed. What changed:    how much to take  reasons to take this   aspirin EC 81 MG tablet Take 81 mg by mouth daily. What changed:  Another medication with the same name was removed. Continue taking this medication, and follow the directions you see here.   atorvastatin 20 MG tablet Commonly known as:  LIPITOR Take 20 mg by mouth daily.   B-complex with vitamin C tablet Take 1 tablet by mouth daily.   diphenhydrAMINE 25 mg capsule Commonly known as:  BENADRYL Take 25-50 mg by mouth at bedtime.   DULoxetine 60 MG capsule Commonly known as:  CYMBALTA TAKE ONE CAPSULE BY MOUTH TWICE A DAY **MUST CALL MD FOR APPOINTMENT What changed:  See the new instructions.   gabapentin 800 MG tablet Commonly known as:  NEURONTIN Take 1 tablet (800 mg total) by mouth 4 (four) times daily. Office visit needed What changed:  additional instructions   HYDROmorphone 2 MG tablet Commonly known as:  DILAUDID Take 1 tablet (2 mg total) by mouth every 6 (six) hours as needed for severe pain (breakthrough post-op pain). What changed:    when to take this  reasons to take this   insulin aspart 100 UNIT/ML FlexPen Commonly known as:   NovoLOG FlexPen Inject 8 Units into the skin 3 (three) times daily with meals.   Insulin Glargine 100 UNIT/ML Solostar Pen Commonly known as:  LANTUS Inject 25 Units into the skin daily at 10 pm.   Insulin Pen Needle 32G X 8 MM Misc Use as directed   lisinopril-hydrochlorothiazide 20-25 MG tablet  Commonly known as:  ZESTORETIC TAKE ONE TABLET BY MOUTH DAILY What changed:  how much to take   metFORMIN 500 MG tablet Commonly known as:  GLUCOPHAGE Take 1 tablet (500 mg total) by mouth 2 (two) times daily with a meal. What changed:  when to take this   methocarbamol 500 MG tablet Commonly known as:  ROBAXIN Take 1-2 tablets (500-1,000 mg total) by mouth 4 (four) times daily.   metoprolol succinate 25 MG 24 hr tablet Commonly known as:  Toprol XL Take 1 tablet (25 mg total) by mouth daily.   multivitamin tablet Take 1 tablet by mouth daily.   ondansetron 4 MG disintegrating tablet Commonly known as:  ZOFRAN-ODT Take 4 mg by mouth every 6 (six) hours as needed for nausea.   Oxycodone HCl 10 MG Tabs Take 1 tablet (10 mg total) by mouth every 4 (four) hours as needed for moderate pain or severe pain (pain score 7-10).   Ozempic (1 MG/DOSE) 2 MG/1.5ML Sopn Generic drug:  Semaglutide (1 MG/DOSE) Inject 1 mg into the skin every Sunday.   traZODone 100 MG tablet Commonly known as:  DESYREL TAKE ONE TABLET BY MOUTH THREE TIMES A DAY -- NEED OFFICE VISIT What changed:  See the new instructions.   varenicline 1 MG tablet Commonly known as:  CHANTIX Take 1 mg by mouth 2 (two) times daily.        Acute coronary syndrome (MI, NSTEMI, STEMI, etc) this admission?: No.    Outstanding Labs/Studies   None.  Duration of Discharge Encounter   Greater than 30 minutes including physician time.  Lorelei PontSigned, Bhavinkumar Bhagat, PA 08/26/2018, 10:33 AM  EP Attending  Patient seen and examined. Agree with above. See my note as well. The patient is stable for DC. Usual followup.    Leonia ReevesGregg Jrue Jarriel,M.D.

## 2018-08-26 NOTE — Progress Notes (Signed)
Patient is discharging from unit to home. Family to provided transportation.  All discharge information reviewed with patient. Patient demonstrates no s/sx of distress or c/o.  Incision to left upper chest dry and intact, no bleeding noted. Sling with patient to home.   Personal belongings with patient.

## 2018-08-26 NOTE — Progress Notes (Signed)
Patient complains of pain from shoulder to shoulder starting at surgical site of ICD placement. RN paged cardiology.  Order given for 5mg  oxycodone PO.  Post ICD placement EKG complete and placed in chart.

## 2018-08-26 NOTE — Progress Notes (Addendum)
Progress Note  Patient Name: Brittney Sawyer Date of Encounter: 08/26/2018  Primary Cardiologist: No primary care provider on file.   Subjective   " I had chest pain last night", almost gone now.  Inpatient Medications    Scheduled Meds: . atorvastatin  20 mg Oral q1800  . Chlorhexidine Gluconate Cloth  6 each Topical Daily  . DULoxetine  60 mg Oral BID  . enoxaparin (LOVENOX) injection  40 mg Subcutaneous Q24H  . gabapentin  800 mg Oral TID  . insulin aspart  0-15 Units Subcutaneous TID WC  . multivitamin with minerals  1 tablet Oral Daily  . [START ON 08/27/2018] Semaglutide (1 MG/DOSE)  1 mg Subcutaneous Q Sun  . sodium chloride flush  3 mL Intravenous Q12H  . sodium chloride flush  3 mL Intravenous Q12H  . traZODone  200 mg Oral QHS  . varenicline  1 mg Oral BID   Continuous Infusions: . sodium chloride 250 mL (08/26/18 0454)  . sodium chloride    .  ceFAZolin (ANCEF) IV 1 g (08/26/18 0455)   PRN Meds: sodium chloride, sodium chloride, acetaminophen, ondansetron (ZOFRAN) IV, sodium chloride flush, sodium chloride flush, traMADol   Vital Signs    Vitals:   08/25/18 1914 08/25/18 2312 08/26/18 0137 08/26/18 0433  BP: 131/90 130/76 104/85 (!) 141/75  Pulse: 69 75 80 77  Resp: (!) 22  Temp: 98 F (36.7 C) 98 F (36.7 C) 98.7 F (37.1 C) 98.1 F (36.7 C)  TempSrc:   Oral Oral  SpO2: 100% 100% 98% 97%  Weight:    75 kg  Height:    5' (1.524 m)    Intake/Output Summary (Last 24 hours) at 08/26/2018 0953 Last data filed at 08/26/2018 0900 Gross per 24 hour  Intake 1041.69 ml  Output 2400 ml  Net -1358.31 ml   Filed Weights   08/25/18 0500 08/25/18 1249 08/26/18 0433  Weight: 71.1 kg 71.1 kg 75 kg    Telemetry    nsr - Personally Reviewed  ECG    NSR - Personally Reviewed  Physical Exam   GEN: No acute distress.  Appears comfortable. Neck: No JVD Cardiac: RRR, no murmurs, rubs, or gallops.  Respiratory: Clear to auscultation bilaterally.  No rub.  GI: Soft, nontender, non-distended  MS: No edema; No deformity. Neuro:  Nonfocal  Psych: Normal affect   Labs    Chemistry Recent Labs  Lab 08/24/18 2104  08/25/18 0500 08/26/18 0505 08/26/18 0730  NA 139   < > 139 137 138  K 3.9   < > 4.4 7.3* 3.8  CL 106  --  105 101 101  CO2 19*  --  GLUCOSE 119*  --  133* 86 116*  BUN 21*  --  CREATININE 1.25*   < > 0.88 0.79 0.75  CALCIUM 8.8*  --  8.4* 8.9 9.1  PROT 6.2*  --   --   --   --   ALBUMIN 3.9  --   --   --   --   AST 32  --   --   --   --   ALT 30  --   --   --   --   ALKPHOS 69  --   --   --   --   BILITOT 0.4  --   --   --   --   GFRNONAA 49*   < > >  60 >60 >60  GFRAA 57*   < > >60 >60 >60  ANIONGAP 14  --  7 12 12    < > = values in this interval not displayed.     Hematology Recent Labs  Lab 08/24/18 2104 08/24/18 2107 08/25/18 0417  WBC 13.9*  --  10.3  RBC 3.58*  --  2.99*  HGB 11.9* 12.6 9.5*  HCT 37.2 37.0 30.8*  MCV 103.9*  --  103.0*  MCH 33.2  --  31.8  MCHC 32.0  --  30.8  RDW 12.4  --  12.5  PLT 324  --  265    Cardiac Enzymes Recent Labs  Lab 08/24/18 2104 08/25/18 0417  TROPONINI <0.03 <0.03   No results for input(s): TROPIPOC in the last 168 hours.   BNPNo results for input(s): BNP, PROBNP in the last 168 hours.   DDimer No results for input(s): DDIMER in the last 168 hours.   Radiology    Dg Chest 2 View  Result Date: 08/26/2018 CLINICAL DATA:  53 year old female unable status post placement of implantable cardioverter-defibrillator. EXAM: CHEST - 2 VIEW COMPARISON:  Preoperative chest x-ray 08/24/2018 FINDINGS: Interval placement of a left subclavian dual lead cardiac rhythm maintenance device. Leads project over the right atrium and right ventricle. The cardiac and mediastinal contours remain within normal limits. No evidence of pneumothorax or large pleural effusion. Inspiratory volumes are slightly low. Surgical changes of prior distal left clavicular  resection. Mild degenerative osteoarthritis at the right acromioclavicular joint. No acute osseous abnormality. IMPRESSION: 1. Interval placement of a left subclavian approach cardiac rhythm maintenance device without evidence of immediate complication. 2. Slightly low inspiratory volumes. Electronically Signed   By: Malachy Moan M.D.   On: 08/26/2018 08:17   Dg Chest Portable 1 View  Result Date: 08/24/2018 CLINICAL DATA:  Sudden onset of shortness of breath EXAM: PORTABLE CHEST 1 VIEW COMPARISON:  09/06/16 FINDINGS: The heart size and mediastinal contours are within normal limits. Both lungs are clear. The visualized skeletal structures are unremarkable. IMPRESSION: No active disease. Electronically Signed   By: Alcide Clever M.D.   On: 08/24/2018 21:35   Mr Cardiac Morphology W Wo Contrast  Result Date: 08/25/2018 CLINICAL DATA:  53 year old female admitted with symptomatic wide complex ventricular tachycardia and complete heart block. EXAM: CARDIAC MRI TECHNIQUE: The patient was scanned on a 1.5 Tesla GE magnet. A dedicated cardiac coil was used. Functional imaging was done using Fiesta sequences. 2,3, and 4 chamber views were done to assess for RWMA's. Modified Simpson's rule using a short axis stack was used to calculate an ejection fraction on a dedicated work [employer]. The patient received 8 cc of Gadavist. After 10 minutes inversion recovery sequences were used to assess for infiltration and scar tissue. CONTRAST:  8 cc  of Gadavist FINDINGS: 1. Normal left ventricular size, thickness and moderately decreased systolic function (LVEF = 41%). Dyskinesis of the mid inferoseptal, anteroseptal and apical anterior, septal and inferior walls. There is no late gadolinium enhancement in the left ventricular myocardium. LVEDD: 50 mm LVESD: 41 mm LVEDV: 155 ml LVESV: 91 ml SV: 64 ml CO: 4.5 L/min Myocardial mass: 93 g 2. Normal right ventricular size, thickness and systolic function  (LVEF = 49%). There are no regional wall motion abnormalities. 3.  Mildly dilated left atrium (41 mm).  Normal right atrial size. 4. Normal size of the aortic root, ascending aorta and pulmonary artery. 5.  Mild aortic and mitral and trivial  tricuspid regurgitation. 6.  Normal pericardium.  Trivial pericardial effusion. IMPRESSION: 1. Normal left ventricular size, thickness and moderately decreased systolic function (LVEF = 41%). Dyskinesis of the mid inferoseptal, anteroseptal and apical anterior, septal and inferior walls. There is no late gadolinium enhancement in the left ventricular myocardium. 2. Normal right ventricular size, thickness and systolic function (LVEF = 49%). There are no regional wall motion abnormalities. 3.  Mildly dilated left atrium (41 mm). Normal right atrial size. 4. Normal size of the aortic root, ascending aorta and pulmonary artery. 5.  Mild aortic and mitral and trivial tricuspid regurgitation. 6.  Normal pericardium.  Trivial pericardial effusion. There is no evidence for an infiltrative cardiomyopathy or arrhythmogenic right ventricular cardiomyopathy. Pattern of wall motion abnormalities - apical ballooning suggestive of a possible stress induced cardiomyopathy or hibernating myocardium with severe LAD stenosis. A cardiac cath is recommended. Electronically Signed   By: Tobias Alexander   On: 08/25/2018 12:50    Cardiac Studies   ICD interogation demonstrates normal device function  Patient Profile     53 y.o. female admitted with VT and syncope, found to have LV dysfunction, s/p cath with no obstructive disease and MR with no gad uptake, and is s/p ICD.  Assessment & Plan    1. VT - she is stable and has had no recurrent symptoms. She was non-inducible during her ICD insertion. She will need to be discharged home on toprol 25 mg daily 2. LV dysfunction - she does not have CHF symptoms. She start toprol with plans to start an ACE/ARB as an outpatient. 3. Chest pain -  resolved. She still has some incisional pain and request dilaudid. She will be prescribed 10 pills with no refill. I warned her that if her chest pain came back and if associated with deep breathing, she would need to call or come back to ED. I cannot exclude microperforation but with good numbers, ECG without change, resolution of symptoms, we will treat conservatively for now.  4. ICD insertion - her DDD ICD interogated under my supervision is working normally. For questions or updates, please contact [facility] Please consult [email or web address] for contact info under Cardiology/STEMI.      Signed, Lewayne Bunting, MD  08/26/2018, 9:53 AM  Patient ID: Remi Deter, female   DOB: Apr 24, 1966, 53 y.o.   MRN: [id number]

## 2018-08-28 ENCOUNTER — Encounter (HOSPITAL_COMMUNITY): Payer: Self-pay | Admitting: Internal Medicine

## 2018-08-28 MED FILL — Lidocaine HCl Local Inj 1%: INTRAMUSCULAR | Qty: 60 | Status: AC

## 2018-08-29 LAB — HEAVY METALS, BLOOD
Arsenic: 12 ug/L (ref 2–23)
Lead: NOT DETECTED ug/dL (ref 0–4)
Mercury: NOT DETECTED ug/L (ref 0.0–14.9)

## 2018-09-01 ENCOUNTER — Telehealth: Payer: Self-pay

## 2018-09-01 ENCOUNTER — Telehealth: Payer: Self-pay | Admitting: Internal Medicine

## 2018-09-01 NOTE — Telephone Encounter (Signed)
The patient apparently walked into the office at 9:36 am this morning 4/24 thinking that she had an appointment with Dr Ladona Ridgel for a wound check following implant.    Helmut Muster, at the front desk told the patient that she did not have an appt for 4/24, but for 4/27.  The patient said that she had questions about why she had the procedure and left, asking for someone to call her at [phone].  Helmut Muster filled out a walk-in form and called me to the front.

## 2018-09-01 NOTE — Telephone Encounter (Signed)
Forwarded to Dr Lubertha Basque nurse.Marland Kitchen

## 2018-09-01 NOTE — Telephone Encounter (Signed)
Per pt call -  Stated would like to know when she can go back to work and working out.  Please give her a call back.

## 2018-09-01 NOTE — Telephone Encounter (Signed)
Left detailed message for Pt.  Advised she could walk at this time, but to be aware of limitations with left arm.  Advised per hospital discharge summary she was not yet cleared to work by Dr. Ladona Ridgel.  Will need to address at her follow up wound check on 4/27.  Confirmed her appt was 4/27 at 11:30 am on her message.  Advised to call office if any further questions

## 2018-09-04 ENCOUNTER — Other Ambulatory Visit: Payer: Self-pay

## 2018-09-04 ENCOUNTER — Ambulatory Visit (INDEPENDENT_AMBULATORY_CARE_PROVIDER_SITE_OTHER): Payer: Commercial Managed Care - PPO | Admitting: Student

## 2018-09-04 DIAGNOSIS — I255 Ischemic cardiomyopathy: Secondary | ICD-10-CM | POA: Diagnosis not present

## 2018-09-04 DIAGNOSIS — I5022 Chronic systolic (congestive) heart failure: Secondary | ICD-10-CM

## 2018-09-04 DIAGNOSIS — I472 Ventricular tachycardia, unspecified: Secondary | ICD-10-CM

## 2018-09-04 LAB — CUP PACEART INCLINIC DEVICE CHECK
Brady Statistic RA Percent Paced: 1 %
Brady Statistic RV Percent Paced: 1 % — CL
Date Time Interrogation Session: 20200427040000
HighPow Impedance: 46 Ohm
Implantable Lead Implant Date: 20200417
Implantable Lead Implant Date: 20200417
Implantable Lead Location: 753859
Implantable Lead Location: 753860
Implantable Lead Model: 292
Implantable Lead Model: 7740
Implantable Lead Serial Number: 1018284
Implantable Lead Serial Number: 445149
Implantable Pulse Generator Implant Date: 20200417
Lead Channel Impedance Value: 469 Ohm
Lead Channel Impedance Value: 566 Ohm
Lead Channel Pacing Threshold Amplitude: 0.7 V
Lead Channel Pacing Threshold Amplitude: 1.2 V
Lead Channel Pacing Threshold Pulse Width: 0.4 ms
Lead Channel Pacing Threshold Pulse Width: 0.8 ms
Lead Channel Sensing Intrinsic Amplitude: 13.2 mV
Lead Channel Sensing Intrinsic Amplitude: 5.8 mV
Lead Channel Setting Pacing Amplitude: 3.5 V
Lead Channel Setting Pacing Amplitude: 3.5 V
Lead Channel Setting Pacing Pulse Width: 0.4 ms
Lead Channel Setting Sensing Sensitivity: 0.5 mV
Pulse Gen Serial Number: 552225

## 2018-09-04 NOTE — Progress Notes (Signed)
Wound check appointment. Steri-strips removed. Wound without redness or edema. Incision edges approximated, wound well healed. Normal device function. Thresholds, sensing, and impedances consistent with implant measurements. Device programmed at 3.5V for extra safety margin until 3 month visit. Histogram distribution appropriate for patient and level of activity. No mode switches or ventricular arrhythmias noted. Patient educated about wound care, arm mobility, lifting restrictions, shock plan. ROV in 3 months with Dr. Ladona Ridgel.  Casimiro Needle [address] [place], [place] 09/04/2018 12:31 PM

## 2018-09-08 ENCOUNTER — Encounter (HOSPITAL_COMMUNITY): Payer: Self-pay | Admitting: Internal Medicine

## 2018-11-29 ENCOUNTER — Telehealth: Payer: Self-pay | Admitting: *Deleted

## 2018-11-29 NOTE — Telephone Encounter (Signed)
Called patient to go over Covid Screening Questions for Friday's appt. Patient is unable to make Friday's appt due to lack of transportation and would like to reschedule appt.  Will forward message to nurse and scheduler.

## 2018-11-30 NOTE — Telephone Encounter (Signed)
Call returned to pt.  Pt rescheduled.

## 2018-12-01 ENCOUNTER — Encounter: Payer: Commercial Managed Care - PPO | Admitting: Internal Medicine

## 2018-12-01 ENCOUNTER — Ambulatory Visit (INDEPENDENT_AMBULATORY_CARE_PROVIDER_SITE_OTHER): Payer: Commercial Managed Care - PPO | Admitting: *Deleted

## 2018-12-01 DIAGNOSIS — I472 Ventricular tachycardia, unspecified: Secondary | ICD-10-CM

## 2018-12-01 LAB — CUP PACEART REMOTE DEVICE CHECK
Battery Remaining Longevity: 156 mo
Battery Remaining Percentage: 100 %
Brady Statistic RA Percent Paced: 3 %
Brady Statistic RV Percent Paced: 0 %
Date Time Interrogation Session: 20200724081100
HighPow Impedance: 60 Ohm
Implantable Lead Implant Date: 20200417
Implantable Lead Implant Date: 20200417
Implantable Lead Location: 753859
Implantable Lead Location: 753860
Implantable Lead Model: 292
Implantable Lead Model: 7740
Implantable Lead Serial Number: 1018284
Implantable Lead Serial Number: 445149
Implantable Pulse Generator Implant Date: 20200417
Lead Channel Impedance Value: 497 Ohm
Lead Channel Impedance Value: 681 Ohm
Lead Channel Setting Pacing Amplitude: 3.5 V
Lead Channel Setting Pacing Amplitude: 3.5 V
Lead Channel Setting Pacing Pulse Width: 0.4 ms
Lead Channel Setting Sensing Sensitivity: 0.5 mV
Pulse Gen Serial Number: 552225

## 2018-12-11 NOTE — Progress Notes (Signed)
Remote ICD transmission.   

## 2019-01-16 ENCOUNTER — Telehealth: Payer: Self-pay | Admitting: Internal Medicine

## 2019-01-16 ENCOUNTER — Encounter: Payer: Self-pay | Admitting: Internal Medicine

## 2019-01-16 ENCOUNTER — Other Ambulatory Visit: Payer: Self-pay

## 2019-01-16 ENCOUNTER — Ambulatory Visit (INDEPENDENT_AMBULATORY_CARE_PROVIDER_SITE_OTHER): Payer: Commercial Managed Care - PPO | Admitting: Internal Medicine

## 2019-01-16 VITALS — BP 112/68 | HR 75 | Ht 60.0 in | Wt 164.0 lb

## 2019-01-16 DIAGNOSIS — I472 Ventricular tachycardia, unspecified: Secondary | ICD-10-CM

## 2019-01-16 NOTE — Progress Notes (Signed)
HPI Brittney Sawyer returns today for followup. She has a h/o chronic systolic heart failure and VT, s/p ICD insertion. She has not had any ICD therapies. She has had a single dizzy spell when she stood up too fast. She denies chest pain or sob. No episodes of VT since her ICD was placed. Allergies  Allergen Reactions  . Celebrex [Celecoxib] Swelling    Site of swelling unknown??  . Wellbutrin [Bupropion] Rash     Current Outpatient Medications  Medication Sig Dispense Refill  . acetaminophen (TYLENOL) 500 MG tablet Take 2 tablets (1,000 mg total) by mouth every 8 (eight) hours as needed. (Patient taking differently: Take 500-1,000 mg by mouth every 8 (eight) hours as needed (for pain or headaches). ) 30 tablet 0  . aspirin EC 81 MG tablet Take 81 mg by mouth daily.    Marland Kitchen atorvastatin (LIPITOR) 20 MG tablet Take 20 mg by mouth daily.    . B Complex-C (B-COMPLEX WITH VITAMIN C) tablet Take 1 tablet by mouth daily.    . diphenhydrAMINE (BENADRYL) 25 mg capsule Take 25-50 mg by mouth at bedtime.     . DULoxetine (CYMBALTA) 60 MG capsule TAKE ONE CAPSULE BY MOUTH TWICE A DAY **MUST CALL MD FOR APPOINTMENT (Patient taking differently: Take 60 mg by mouth 2 (two) times daily. ) 60 capsule 0  . gabapentin (NEURONTIN) 800 MG tablet Take 1 tablet (800 mg total) by mouth 4 (four) times daily. Office visit needed (Patient taking differently: Take 800 mg by mouth 4 (four) times daily. ) 60 tablet 0  . HYDROmorphone (DILAUDID) 2 MG tablet Take 1 tablet (2 mg total) by mouth every 6 (six) hours as needed for severe pain (breakthrough post-op pain). 10 tablet 0  . insulin aspart (NOVOLOG FLEXPEN) 100 UNIT/ML FlexPen Inject 8 Units into the skin 3 (three) times daily with meals. 15 mL 0  . Insulin Glargine (LANTUS) 100 UNIT/ML Solostar Pen Inject 25 Units into the skin daily at 10 pm. 15 mL 0  . Insulin Pen Needle 32G X 8 MM MISC Use as directed 100 each 0  . metFORMIN (GLUCOPHAGE) 500 MG tablet Take 1  tablet (500 mg total) by mouth 2 (two) times daily with a meal. (Patient taking differently: Take 500 mg by mouth at bedtime. ) 90 tablet 0  . methocarbamol (ROBAXIN) 500 MG tablet Take 1-2 tablets (500-1,000 mg total) by mouth 4 (four) times daily. 60 tablet 0  . metoprolol succinate (TOPROL XL) 25 MG 24 hr tablet Take 1 tablet (25 mg total) by mouth daily. 30 tablet 11  . Multiple Vitamin (MULTIVITAMIN) tablet Take 1 tablet by mouth daily.    . ondansetron (ZOFRAN-ODT) 4 MG disintegrating tablet Take 4 mg by mouth every 6 (six) hours as needed for nausea.     Marland Kitchen oxyCODONE 10 MG TABS Take 1 tablet (10 mg total) by mouth every 4 (four) hours as needed for moderate pain or severe pain (pain score 7-10). 40 tablet 0  . OZEMPIC, 1 MG/DOSE, 2 MG/1.5ML SOPN Inject 1 mg into the skin every Sunday.     . traZODone (DESYREL) 100 MG tablet TAKE ONE TABLET BY MOUTH THREE TIMES A DAY -- NEED OFFICE VISIT (Patient taking differently: Take 200-300 mg by mouth at bedtime. ) 90 tablet 0  . varenicline (CHANTIX) 1 MG tablet Take 1 mg by mouth 2 (two) times daily.     No current facility-administered medications for this visit.  Past Medical History:  Diagnosis Date  . Anxiety   . Arthritis    "all my joints" (06/20/2017)  . Depression   . Diabetes mellitus type 2, controlled (HCC) 05/20/2014  . GERD (gastroesophageal reflux disease)    "gone w/diet revision" (06/20/2017)  . Hyperlipidemia   . Hypertension   . Infection    right knee replacement  . Neuropathy    post back surgery  . Pneumonia X 1  . Poorly controlled diabetes mellitus (HCC) 07/26/2017  . Right knee DJD 01/27/2013  . Spinal headache   . Substance abuse (HCC)    Alcohol - sober since 1996    ROS:   All systems reviewed and negative except as noted in the HPI.   Past Surgical History:  Procedure Laterality Date  . AMPUTATION Right 10/03/2015   Procedure: Right Foot 3rd Ray Amputation;  Surgeon: Nadara MustardMarcus Duda V, MD;  [facility];  Service: Orthopedics;  Laterality: Right;  . BACK SURGERY    . BUNIONECTOMY WITH HAMMERTOE RECONSTRUCTION Bilateral   . EXCISIONAL TOTAL KNEE ARTHROPLASTY WITH ANTIBIOTIC SPACERS Right 06/20/2017  . EXCISIONAL TOTAL KNEE ARTHROPLASTY WITH ANTIBIOTIC SPACERS Right 06/20/2017   Procedure: EXCISION RIGHT TOTAL KNEE ARTHROPLASTY WITH ANTIBIOTIC SPACERS;  Surgeon: Dannielle HuhLucey, Steve, MD;  Location: [facility] OR;  Service: Orthopedics;  Laterality: Right;  . ICD IMPLANT N/A 08/25/2018   Procedure: ICD IMPLANT;  Surgeon: Marinus Mawaylor, Tarell Schollmeyer W, MD;  [facility];  Service: Cardiovascular;  Laterality: N/A;  . JOINT REPLACEMENT    . LEFT HEART CATH AND CORONARY ANGIOGRAPHY N/A 08/25/2018   Procedure: LEFT HEART CATH AND CORONARY ANGIOGRAPHY;  Surgeon: Dolores PattyBensimhon, Daniel R, MD;  Location: [facility] INVASIVE CV LAB;  Service: Cardiovascular;  Laterality: N/A;  . POSTERIOR LUMBAR FUSION  11/2007   L4, L5   . POSTERIOR LUMBAR FUSION  01/2008     second surgery L2-L5 and S1  . SHOULDER ARTHROSCOPY W/ ROTATOR CUFF REPAIR Left 01/2017  . TOTAL KNEE ARTHROPLASTY Right 01/26/2013   Procedure: TOTAL KNEE ARTHROPLASTY;  Surgeon: Thera FlakeW D Caffrey Jr., MD;  Location: [facility] OR;  Service: Orthopedics;  Laterality: Right;  . TOTAL KNEE REVISION Right 08/22/2017   Procedure: RIGHT TOTAL KNEE REVISION WUITH REMOVAL OF ANTIBIOTIC SPACER;  Surgeon: Dannielle HuhLucey, Steve, MD;  Location: [facility] OR;  Service: Orthopedics;  Laterality: Right;  . TUBAL LIGATION    . VAGINAL HYSTERECTOMY       Family History  Problem Relation Age of Onset  . Thyroid disease Mother   . Hypertension Father   . Anxiety disorder Sister   . Cancer Paternal Grandmother        prostate     Social History   Socioeconomic History  . Marital status: Married    Spouse name: Not on file  . Number of children: Not on file  . Years of education: Not on file  . Highest education level: Not on file  Occupational History  . Not on file  Social Needs  . Financial  resource strain: Not on file  . Food insecurity    Worry: Not on file    Inability: Not on file  . Transportation needs    Medical: Not on file    Non-medical: Not on file  Tobacco Use  . Smoking status: Current Every Day Smoker    Packs/day: 0.50    Years: 15.00    Pack years: 7.50    Types: Cigarettes  . Smokeless tobacco: Never Used  . Tobacco comment:  Pt on Chantix  Substance and Sexual Activity  . Alcohol use: No    Alcohol/week: 0.0 standard drinks    Comment: Pt is a recovering alcoholic and has been sober since ~2006.  . Drug use: Yes    Types: Marijuana, Oxycodone, Hydrocodone    Comment: 2/11//2019 "nothing in the last year"  . Sexual activity: Yes    Birth control/protection: Surgical  Lifestyle  . Physical activity    Days per week: Not on file    Minutes per session: Not on file  . Stress: Not on file  Relationships  . Social Musician on phone: Not on file    Gets together: Not on file    Attends religious service: Not on file    Active member of club or organization: Not on file    Attends meetings of clubs or organizations: Not on file    Relationship status: Not on file  . Intimate partner violence    Fear of current or ex partner: Not on file    Emotionally abused: Not on file    Physically abused: Not on file    Forced sexual activity: Not on file  Other Topics Concern  . Not on file  Social History Narrative  . Not on file     BP 112/68   Pulse 75   Ht 5' (1.524 m)   Wt 164 lb (74.4 kg)   SpO2 99%   BMI 32.03 kg/m   Physical Exam:  Well appearing NAD HEENT: Unremarkable Neck:  No JVD, no thyromegally Lymphatics:  No adenopathy Back:  No CVA tenderness Lungs:  Clear with no wheezes HEART:  Regular rate rhythm, no murmurs, no rubs, no clicks Abd:  soft, positive bowel sounds, no organomegally, no rebound, no guarding Ext:  2 plus pulses, no edema, no cyanosis, no clubbing Skin:  No rashes no nodules Neuro:  CN II  through XII intact, motor grossly intact  EKG - nsr with first degree AV block  DEVICE  Normal device function.  See PaceArt for details.   Assess/Plan: 1. VT - she has had no VT since her ICD was placed.  2. ICD - her [employer] ICD is working normally.  3. ICM - she denies anginal symptoms. She is encouraged to maintain a low fat diet.   Leonia Reeves.D.

## 2019-01-16 NOTE — Telephone Encounter (Signed)
New Message    Pt says she is returning a call from today     Please call back

## 2019-01-16 NOTE — Telephone Encounter (Signed)
Left detailed message.  Advised this nurse could not determine who may have tried to call her, reminded she is scheduled to see Dr. Lovena Le today 01/16/2019 at 4:30 pm.

## 2019-01-16 NOTE — Patient Instructions (Signed)
Medication Instructions:  none If you need a refill on your cardiac medications before your next appointment, please call your pharmacy.   Lab work: none If you have labs (blood work) drawn today and your tests are completely normal, you will receive your results only by: . MyChart Message (if you have MyChart) OR . A paper copy in the mail If you have any lab test that is abnormal or we need to change your treatment, we will call you to review the results.  Testing/Procedures: none  Follow-Up: At [facility], you and your health needs are our priority.  As part of our continuing mission to provide you with exceptional heart care, we have created designated Provider Care Teams.  These Care Teams include your primary Cardiologist (physician) and Advanced Practice Providers (APPs -  Physician Assistants and Nurse Practitioners) who all work together to provide you with the care you need, when you need it. You will need a follow up appointment in 1 years.  Please call our office 2 months in advance to schedule this appointment.  You may see Dr Taylor or one of the following Advanced Practice Providers on your designated Care Team:   Amber Seiler, NP . Renee Ursuy, PA-C  Any Other Special Instructions Will Be Listed Below (If Applicable).    

## 2019-03-05 ENCOUNTER — Ambulatory Visit (INDEPENDENT_AMBULATORY_CARE_PROVIDER_SITE_OTHER): Payer: Commercial Managed Care - PPO | Admitting: *Deleted

## 2019-03-05 DIAGNOSIS — I472 Ventricular tachycardia, unspecified: Secondary | ICD-10-CM

## 2019-03-06 LAB — CUP PACEART REMOTE DEVICE CHECK
Battery Remaining Longevity: 156 mo
Battery Remaining Percentage: 100 %
Brady Statistic RA Percent Paced: 3 %
Brady Statistic RV Percent Paced: 0 %
Date Time Interrogation Session: 20201026081200
HighPow Impedance: 69 Ohm
Implantable Lead Implant Date: 20200417
Implantable Lead Implant Date: 20200417
Implantable Lead Location: 753859
Implantable Lead Location: 753860
Implantable Lead Model: 292
Implantable Lead Model: 7740
Implantable Lead Serial Number: 1018284
Implantable Lead Serial Number: 445149
Implantable Pulse Generator Implant Date: 20200417
Lead Channel Impedance Value: 502 Ohm
Lead Channel Impedance Value: 656 Ohm
Lead Channel Setting Pacing Amplitude: 3.5 V
Lead Channel Setting Pacing Amplitude: 3.5 V
Lead Channel Setting Pacing Pulse Width: 0.4 ms
Lead Channel Setting Sensing Sensitivity: 0.5 mV
Pulse Gen Serial Number: 552225

## 2019-03-09 ENCOUNTER — Ambulatory Visit (INDEPENDENT_AMBULATORY_CARE_PROVIDER_SITE_OTHER): Payer: 59

## 2019-03-09 ENCOUNTER — Other Ambulatory Visit: Payer: Self-pay | Admitting: Podiatry

## 2019-03-09 ENCOUNTER — Encounter: Payer: Self-pay | Admitting: Podiatry

## 2019-03-09 ENCOUNTER — Other Ambulatory Visit: Payer: Self-pay

## 2019-03-09 ENCOUNTER — Ambulatory Visit (INDEPENDENT_AMBULATORY_CARE_PROVIDER_SITE_OTHER): Payer: 59 | Admitting: Podiatry

## 2019-03-09 VITALS — BP 127/60 | HR 56 | Resp 16

## 2019-03-09 DIAGNOSIS — M2042 Other hammer toe(s) (acquired), left foot: Secondary | ICD-10-CM | POA: Diagnosis not present

## 2019-03-09 DIAGNOSIS — M2062 Acquired deformities of toe(s), unspecified, left foot: Secondary | ICD-10-CM

## 2019-03-09 DIAGNOSIS — E119 Type 2 diabetes mellitus without complications: Secondary | ICD-10-CM | POA: Diagnosis not present

## 2019-03-09 DIAGNOSIS — L84 Corns and callosities: Secondary | ICD-10-CM | POA: Diagnosis not present

## 2019-03-09 DIAGNOSIS — M21619 Bunion of unspecified foot: Secondary | ICD-10-CM

## 2019-03-09 DIAGNOSIS — M205X1 Other deformities of toe(s) (acquired), right foot: Secondary | ICD-10-CM

## 2019-03-09 DIAGNOSIS — L97429 Non-pressure chronic ulcer of left heel and midfoot with unspecified severity: Secondary | ICD-10-CM

## 2019-03-09 MED ORDER — CICLOPIROX 8 % EX SOLN: Freq: Every day | CUTANEOUS | 2 refills | Status: DC

## 2019-03-09 NOTE — Progress Notes (Signed)
Subjective:   Patient ID: Brittney Sawyer, female   DOB: 53 y.o.   MRN: [id number]   HPI 53 year old female presents the office today for concerns of a callus on the left fifth toe.  She states is not on the for several years and she has a thick callus on the area causing pain.  She does have a history of bunion surgery but she states that her big toe and her second toe causing the discomfort.  Denies any open sores denies any redness or drainage or any swelling at this time.  Also has secondary concerns of toenail fungus.  Nails are thickened discolored.  Denies any pain in the nails.  She is diabetic and last A1c was 5.7.  She does have a history of neuropathy.  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   Review of Systems  All other systems reviewed and are negative.  Past Medical History:  Diagnosis Date  . Anxiety   . Arthritis    "all my joints" (06/20/2017)  . Depression   . Diabetes mellitus type 2, controlled (HCC) 05/20/2014  . GERD (gastroesophageal reflux disease)    "gone w/diet revision" (06/20/2017)  . Hyperlipidemia   . Hypertension   . Infection    right knee replacement  . Neuropathy    post back surgery  . Pneumonia X 1  . Poorly controlled diabetes mellitus (HCC) 07/26/2017  . Right knee DJD 01/27/2013  . Spinal headache   . Substance abuse (HCC)    Alcohol - sober since 1996    Past Surgical History:  Procedure Laterality Date  . AMPUTATION Right 10/03/2015   Procedure: Right Foot 3rd Ray Amputation;  Surgeon: Nadara Mustard, MD;  Location: [facility] OR;  Service: Orthopedics;  Laterality: Right;  . BACK SURGERY    . BUNIONECTOMY WITH HAMMERTOE RECONSTRUCTION Bilateral   . EXCISIONAL TOTAL KNEE ARTHROPLASTY WITH ANTIBIOTIC SPACERS Right 06/20/2017  . EXCISIONAL TOTAL KNEE ARTHROPLASTY WITH ANTIBIOTIC SPACERS Right 06/20/2017   Procedure: EXCISION RIGHT TOTAL KNEE ARTHROPLASTY WITH ANTIBIOTIC SPACERS;  Surgeon: Dannielle Huh, MD;  Location: [facility] OR;   Service: Orthopedics;  Laterality: Right;  . ICD IMPLANT N/A 08/25/2018   Procedure: ICD IMPLANT;  Surgeon: Marinus Maw, MD;  Location: [facility] INVASIVE CV LAB;  Service: Cardiovascular;  Laterality: N/A;  . JOINT REPLACEMENT    . LEFT HEART CATH AND CORONARY ANGIOGRAPHY N/A 08/25/2018   Procedure: LEFT HEART CATH AND CORONARY ANGIOGRAPHY;  Surgeon: Dolores Patty, MD;  Location: [facility] INVASIVE CV LAB;  Service: Cardiovascular;  Laterality: N/A;  . POSTERIOR LUMBAR FUSION  11/2007   L4, L5   . POSTERIOR LUMBAR FUSION  01/2008     second surgery L2-L5 and S1  . SHOULDER ARTHROSCOPY W/ ROTATOR CUFF REPAIR Left 01/2017  . TOTAL KNEE ARTHROPLASTY Right 01/26/2013   Procedure: TOTAL KNEE ARTHROPLASTY;  Surgeon: Thera Flake., MD;  Location: [facility] OR;  Service: Orthopedics;  Laterality: Right;  . TOTAL KNEE REVISION Right 08/22/2017   Procedure: RIGHT TOTAL KNEE REVISION WUITH REMOVAL OF ANTIBIOTIC SPACER;  Surgeon: Dannielle Huh, MD;  Location: [facility] OR;  Service: Orthopedics;  Laterality: Right;  . TUBAL LIGATION    . VAGINAL HYSTERECTOMY       Current Outpatient Medications:  .  Dulaglutide 0.75 MG/0.5ML SOPN, Inject into the skin., Disp: , Rfl:  .  FLUoxetine (PROZAC) 40 MG capsule, Take by mouth., Disp: , Rfl:  .  gabapentin (NEURONTIN) 800 MG tablet,  TAKE ONE TABLET BY MOUTH FOUR TIMES A DAY, Disp: , Rfl:  .  glucose blood test strip, Use to check blood sugar 3 time(s) daily, Disp: , Rfl:  .  Lancets (FREESTYLE) lancets, Use to check blood sugar 3 time(s) daily., Disp: , Rfl:  .  metFORMIN (GLUCOPHAGE) 500 MG tablet, Take by mouth., Disp: , Rfl:  .  varenicline (CHANTIX) 1 MG tablet, TAKE ONE TABLET BY MOUTH TWICE A DAY, Disp: , Rfl:  .  acetaminophen (TYLENOL) 500 MG tablet, Take 2 tablets (1,000 mg total) by mouth every 8 (eight) hours as needed. (Patient taking differently: Take 500-1,000 mg by mouth every 8 (eight) hours as needed (for pain or headaches). ), Disp: 30 tablet, Rfl: 0 .   aspirin EC 81 MG tablet, Take 81 mg by mouth daily., Disp: , Rfl:  .  atorvastatin (LIPITOR) 20 MG tablet, Take 20 mg by mouth daily., Disp: , Rfl:  .  B Complex-C (B-COMPLEX WITH VITAMIN C) tablet, Take 1 tablet by mouth daily., Disp: , Rfl:  .  ciclopirox (PENLAC) 8 % solution, Apply topically at bedtime. Apply over nail and surrounding skin. Apply daily over previous coat. After seven (7) days, may remove with alcohol and continue cycle., Disp: 6.6 mL, Rfl: 2 .  diphenhydrAMINE (BENADRYL) 25 mg capsule, Take 25-50 mg by mouth at bedtime. , Disp: , Rfl:  .  gabapentin (NEURONTIN) 800 MG tablet, Take 1 tablet (800 mg total) by mouth 4 (four) times daily. Office visit needed (Patient taking differently: Take 800 mg by mouth 4 (four) times daily. ), Disp: 60 tablet, Rfl: 0 .  HYDROmorphone (DILAUDID) 2 MG tablet, Take 1 tablet (2 mg total) by mouth every 6 (six) hours as needed for severe pain (breakthrough post-op pain)., Disp: 10 tablet, Rfl: 0 .  insulin aspart (NOVOLOG FLEXPEN) 100 UNIT/ML FlexPen, Inject 8 Units into the skin 3 (three) times daily with meals., Disp: 15 mL, Rfl: 0 .  Insulin Glargine (LANTUS) 100 UNIT/ML Solostar Pen, Inject 25 Units into the skin daily at 10 pm., Disp: 15 mL, Rfl: 0 .  Insulin Pen Needle 32G X 8 MM MISC, Use as directed, Disp: 100 each, Rfl: 0 .  metFORMIN (GLUCOPHAGE) 500 MG tablet, Take 1 tablet (500 mg total) by mouth 2 (two) times daily with a meal. (Patient taking differently: Take 500 mg by mouth at bedtime. ), Disp: 90 tablet, Rfl: 0 .  methocarbamol (ROBAXIN) 500 MG tablet, Take 1-2 tablets (500-1,000 mg total) by mouth 4 (four) times daily., Disp: 60 tablet, Rfl: 0 .  metoprolol succinate (TOPROL XL) 25 MG 24 hr tablet, Take 1 tablet (25 mg total) by mouth daily., Disp: 30 tablet, Rfl: 11 .  Multiple Vitamin (MULTIVITAMIN) tablet, Take 1 tablet by mouth daily., Disp: , Rfl:  .  ondansetron (ZOFRAN-ODT) 4 MG disintegrating tablet, Take 4 mg by mouth every 6  (six) hours as needed for nausea. , Disp: , Rfl:  .  oxyCODONE 10 MG TABS, Take 1 tablet (10 mg total) by mouth every 4 (four) hours as needed for moderate pain or severe pain (pain score 7-10)., Disp: 40 tablet, Rfl: 0 .  OZEMPIC, 1 MG/DOSE, 2 MG/1.5ML SOPN, Inject 1 mg into the skin every Sunday. , Disp: , Rfl:  .  traZODone (DESYREL) 100 MG tablet, TAKE ONE TABLET BY MOUTH THREE TIMES A DAY -- NEED OFFICE VISIT (Patient taking differently: Take 200-300 mg by mouth at bedtime. ), Disp: 90 tablet, Rfl: 0 .  varenicline (CHANTIX) 1 MG tablet, Take 1 mg by mouth 2 (two) times daily., Disp: , Rfl:   Allergies  Allergen Reactions  . Celebrex [Celecoxib] Swelling    Site of swelling unknown??  . Wellbutrin [Bupropion] Rash          Objective:  Physical Exam  General: AAO x3, NAD  Dermatological: Hyperkeratotic lesion medial second toe on the left foot on the PIPJ.  No ongoing ulceration drainage or any signs of infection.  Nails are hypertrophic, dystrophic with yellow-brown discoloration.  No pain of the nails no signs of infection.  No open lesions.  Vascular: Dorsalis Pedis artery and Posterior Tibial artery pedal pulses are 2/4 bilateral with immedate capillary fill time. There is no pain with calf compression, swelling, warmth, erythema.   Neruologic: Sensation decreased with Semmes once monofilament  Musculoskeletal: Hallux abductus is present on the left side as well as arthritic changes present of the left second toe with medial deviation of the second toe.  Muscular strength 5/5 in all groups tested bilateral.  Gait: Unassisted, Nonantalgic.       Assessment:   Preulcerative callus due to digital deformity left side; onychomycosis     Plan:  -Treatment options discussed including all alternatives, risks, and complications -Etiology of symptoms were discussed -X-rays were obtained and reviewed with the patient.  History of previous bunion surgery.  Hallux abductus is  present as well as digital deformity on the second toe.  No evidence of acute fracture, osteomyelitis. -Debrided the hyperkeratotic lesion without any complications or bleeding.  Dispensed offloading pads.  Ultimately discussed surgical intervention including Akin osteotomy with repair of the second digit of the left foot.  She would like to consider this after the first of the year. -Penlac for nails  Return in about 2 months (around 05/09/2019), or if symptoms worsen or fail to improve.  Vivi Barrack DPM

## 2019-03-09 NOTE — Patient Instructions (Signed)
Diabetes Mellitus and Foot Care Foot care is an important part of your health, especially when you have diabetes. Diabetes may cause you to have problems because of poor blood flow (circulation) to your feet and legs, which can cause your skin to:  Become thinner and drier.  Break more easily.  Heal more slowly.  Peel and crack. You may also have nerve damage (neuropathy) in your legs and feet, causing decreased feeling in them. This means that you may not notice minor injuries to your feet that could lead to more serious problems. Noticing and addressing any potential problems early is the best way to prevent future foot problems. How to care for your feet Foot hygiene  Wash your feet daily with warm water and mild soap. Do not use hot water. Then, pat your feet and the areas between your toes until they are completely dry. Do not soak your feet as this can dry your skin.  Trim your toenails straight across. Do not dig under them or around the cuticle. File the edges of your nails with an emery board or nail file.  Apply a moisturizing lotion or petroleum jelly to the skin on your feet and to dry, brittle toenails. Use lotion that does not contain alcohol and is unscented. Do not apply lotion between your toes. Shoes and socks  Wear clean socks or stockings every day. Make sure they are not too tight. Do not wear knee-high stockings since they may decrease blood flow to your legs.  Wear shoes that fit properly and have enough cushioning. Always look in your shoes before you put them on to be sure there are no objects inside.  To break in new shoes, wear them for just a few hours a day. This prevents injuries on your feet. Wounds, scrapes, corns, and calluses  Check your feet daily for blisters, cuts, bruises, sores, and redness. If you cannot see the bottom of your feet, use a mirror or ask someone for help.  Do not cut corns or calluses or try to remove them with medicine.  If you  find a minor scrape, cut, or break in the skin on your feet, keep it and the skin around it clean and dry. You may clean these areas with mild soap and water. Do not clean the area with peroxide, alcohol, or iodine.  If you have a wound, scrape, corn, or callus on your foot, look at it several times a day to make sure it is healing and not infected. Check for: ? Redness, swelling, or pain. ? Fluid or blood. ? Warmth. ? Pus or a bad smell. General instructions  Do not cross your legs. This may decrease blood flow to your feet.  Do not use heating pads or hot water bottles on your feet. They may burn your skin. If you have lost feeling in your feet or legs, you may not know this is happening until it is too late.  Protect your feet from hot and cold by wearing shoes, such as at the beach or on hot pavement.  Schedule a complete foot exam at least once a year (annually) or more often if you have foot problems. If you have foot problems, report any cuts, sores, or bruises to your health care provider immediately. Contact a health care provider if:  You have a medical condition that increases your risk of infection and you have any cuts, sores, or bruises on your feet.  You have an injury that is not   healing.  You have redness on your legs or feet.  You feel burning or tingling in your legs or feet.  You have pain or cramps in your legs and feet.  Your legs or feet are numb.  Your feet always feel cold.  You have pain around a toenail. Get help right away if:  You have a wound, scrape, corn, or callus on your foot and: ? You have pain, swelling, or redness that gets worse. ? You have fluid or blood coming from the wound, scrape, corn, or callus. ? Your wound, scrape, corn, or callus feels warm to the touch. ? You have pus or a bad smell coming from the wound, scrape, corn, or callus. ? You have a fever. ? You have a red line going up your leg. Summary  Check your feet every day  for cuts, sores, red spots, swelling, and blisters.  Moisturize feet and legs daily.  Wear shoes that fit properly and have enough cushioning.  If you have foot problems, report any cuts, sores, or bruises to your health care provider immediately.  Schedule a complete foot exam at least once a year (annually) or more often if you have foot problems. This information is not intended to replace advice given to you by your health care provider. Make sure you discuss any questions you have with your health care provider. Document Released: 04/23/2000 Document Revised: 06/08/2017 Document Reviewed: 05/28/2016 Elsevier Patient Education  2020 [employer]  

## 2019-03-16 DIAGNOSIS — Z20828 Contact with and (suspected) exposure to other viral communicable diseases: Secondary | ICD-10-CM | POA: Diagnosis not present

## 2019-03-16 DIAGNOSIS — J189 Pneumonia, unspecified organism: Secondary | ICD-10-CM | POA: Diagnosis not present

## 2019-03-21 DIAGNOSIS — J189 Pneumonia, unspecified organism: Secondary | ICD-10-CM | POA: Diagnosis not present

## 2019-03-23 NOTE — Progress Notes (Signed)
Remote ICD transmission.   

## 2019-06-03 DIAGNOSIS — Z20828 Contact with and (suspected) exposure to other viral communicable diseases: Secondary | ICD-10-CM | POA: Diagnosis not present

## 2019-06-04 ENCOUNTER — Ambulatory Visit (INDEPENDENT_AMBULATORY_CARE_PROVIDER_SITE_OTHER): Payer: 59 | Admitting: *Deleted

## 2019-06-04 DIAGNOSIS — I472 Ventricular tachycardia, unspecified: Secondary | ICD-10-CM

## 2019-06-05 ENCOUNTER — Telehealth: Payer: Self-pay

## 2019-06-05 LAB — CUP PACEART REMOTE DEVICE CHECK
Battery Remaining Longevity: 156 mo
Battery Remaining Percentage: 100 %
Brady Statistic RA Percent Paced: 14 %
Brady Statistic RV Percent Paced: 0 %
Date Time Interrogation Session: 20210125041200
HighPow Impedance: 71 Ohm
Implantable Lead Implant Date: 20200417
Implantable Lead Implant Date: 20200417
Implantable Lead Location: 753859
Implantable Lead Location: 753860
Implantable Lead Model: 292
Implantable Lead Model: 7740
Implantable Lead Serial Number: 1018284
Implantable Lead Serial Number: 445149
Implantable Pulse Generator Implant Date: 20200417
Lead Channel Impedance Value: 515 Ohm
Lead Channel Impedance Value: 636 Ohm
Lead Channel Setting Pacing Amplitude: 3.5 V
Lead Channel Setting Pacing Amplitude: 3.5 V
Lead Channel Setting Pacing Pulse Width: 0.4 ms
Lead Channel Setting Sensing Sensitivity: 0.5 mV
Pulse Gen Serial Number: 552225

## 2019-06-05 NOTE — Telephone Encounter (Signed)
Left message for patient to remind of missed remote transmission.  

## 2019-06-11 ENCOUNTER — Emergency Department
Admission: EM | Admit: 2019-06-11 | Discharge: 2019-06-11 | Disposition: A | Discharge status: Home / Self Care | Payer: 59 | Source: Home / Self Care

## 2019-06-11 ENCOUNTER — Encounter: Payer: Self-pay | Admitting: Family Medicine

## 2019-06-11 DIAGNOSIS — R319 Hematuria, unspecified: Secondary | ICD-10-CM

## 2019-06-11 DIAGNOSIS — N390 Urinary tract infection, site not specified: Secondary | ICD-10-CM | POA: Diagnosis not present

## 2019-06-11 LAB — POCT URINALYSIS DIP (MANUAL ENTRY)
Glucose, UA: NEGATIVE mg/dL
Nitrite, UA: NEGATIVE
Protein Ur, POC: >=300 mg/dL — AB
Spec Grav, UA: 1.02 (ref 1.010–1.025)
Urobilinogen, UA: 2 E.U./dL — AB
pH, UA: 5.5 (ref 5.0–8.0)

## 2019-06-11 MED ORDER — SULFAMETHOXAZOLE-TRIMETHOPRIM 800-160 MG PO TABS: 1.0000 | ORAL_TABLET | Freq: Two times a day (BID) | ORAL | 0 refills | Status: DC

## 2019-06-11 NOTE — ED Triage Notes (Signed)
Pt just had pneumonia 3 weeks ago.  Treated with Augmentin, z pac, and prednisone.  Finished Augmentin on Friday.

## 2019-06-11 NOTE — Discharge Instructions (Addendum)
Strategies to prevent and/or treat COVID-19:  Vitamin D3 5000 IU (125 mcg) daily Vitamin C 500 mg twice daily Zinc 50 to 75 mg daily  Listerine type mouthwash 4 times a day    

## 2019-06-11 NOTE — ED Triage Notes (Signed)
Thursday or Friday noticed blood in urine, and has not stopped.  Denies burning or pain

## 2019-06-11 NOTE — ED Provider Notes (Signed)
[facility]    CSN: [id number] Arrival date & time: 06/11/19  1658      History   Chief Complaint Chief Complaint  Patient presents with  . Hematuria    HPI Brittney Sawyer is a 54 y.o. female.   Initial [facility] visit>   Pt just had pneumonia 3 weeks ago.  Treated with Augmentin, z pac, and prednisone.  Finished Augmentin on Friday.  Thursday or Friday noticed blood in urine, and has not stopped.  Denies burning or pain       Past Medical History:  Diagnosis Date  . Anxiety   . Arthritis    "all my joints" (06/20/2017)  . Depression   . Diabetes mellitus type 2, controlled (HCC) 05/20/2014  . GERD (gastroesophageal reflux disease)    "gone w/diet revision" (06/20/2017)  . Hyperlipidemia   . Hypertension   . Infection    right knee replacement  . Neuropathy    post back surgery  . Pneumonia X 1  . Poorly controlled diabetes mellitus (HCC) 07/26/2017  . Right knee DJD 01/27/2013  . Spinal headache   . Substance abuse (HCC)    Alcohol - sober since 1996    Patient Active Problem List   Diagnosis Date Noted  . VT (ventricular tachycardia) (HCC) 08/24/2018  . Poorly controlled diabetes mellitus (HCC) 07/26/2017  . DKA (diabetic ketoacidoses) (HCC) 07/20/2017  . Prosthetic joint infection (HCC)   . Septic infrapatellar bursitis of right knee   . S/P revision of total knee 06/20/2017  . Septic joint (HCC) 06/09/2017  . History of tobacco use 08/31/2016  . Diabetes mellitus type 2, controlled (HCC) 05/20/2014  . Right knee DJD 01/27/2013  . Vaginal candidiasis 01/27/2013  . Metabolic syndrome 12/20/2012  . Obesity (BMI 35.0-39.9 without comorbidity) 12/19/2012  . History of substance use disorder 01/07/2012  . Menopause 11/25/2011  . DDD (degenerative disc disease), lumbar 11/25/2011  . HTN (hypertension) 10/21/2011  . Mood disorder (HCC) 10/21/2011  . Chronic pain 10/21/2011    Past Surgical History:  Procedure Laterality Date  . AMPUTATION Right  10/03/2015   Procedure: Right Foot 3rd Ray Amputation;  Surgeon: Nadara Mustard, MD;  Location: [facility] OR;  Service: Orthopedics;  Laterality: Right;  . BACK SURGERY    . BUNIONECTOMY WITH HAMMERTOE RECONSTRUCTION Bilateral   . EXCISIONAL TOTAL KNEE ARTHROPLASTY WITH ANTIBIOTIC SPACERS Right 06/20/2017  . EXCISIONAL TOTAL KNEE ARTHROPLASTY WITH ANTIBIOTIC SPACERS Right 06/20/2017   Procedure: EXCISION RIGHT TOTAL KNEE ARTHROPLASTY WITH ANTIBIOTIC SPACERS;  Surgeon: Dannielle Huh, MD;  Location: [facility] OR;  Service: Orthopedics;  Laterality: Right;  . ICD IMPLANT N/A 08/25/2018   Procedure: ICD IMPLANT;  Surgeon: Marinus Maw, MD;  Location: [facility] INVASIVE CV LAB;  Service: Cardiovascular;  Laterality: N/A;  . JOINT REPLACEMENT    . LEFT HEART CATH AND CORONARY ANGIOGRAPHY N/A 08/25/2018   Procedure: LEFT HEART CATH AND CORONARY ANGIOGRAPHY;  Surgeon: Dolores Patty, MD;  Location: [facility] INVASIVE CV LAB;  Service: Cardiovascular;  Laterality: N/A;  . POSTERIOR LUMBAR FUSION  11/2007   L4, L5   . POSTERIOR LUMBAR FUSION  01/2008     second surgery L2-L5 and S1  . SHOULDER ARTHROSCOPY W/ ROTATOR CUFF REPAIR Left 01/2017  . TOTAL KNEE ARTHROPLASTY Right 01/26/2013   Procedure: TOTAL KNEE ARTHROPLASTY;  Surgeon: Thera Flake., MD;  Location: [facility] OR;  Service: Orthopedics;  Laterality: Right;  . TOTAL KNEE REVISION Right 08/22/2017   Procedure: RIGHT TOTAL KNEE  REVISION WUITH REMOVAL OF ANTIBIOTIC SPACER;  Surgeon: Dannielle Huh, MD;  Location: [facility] OR;  Service: Orthopedics;  Laterality: Right;  . TUBAL LIGATION    . VAGINAL HYSTERECTOMY      OB History   No obstetric history on file.      Home Medications    Prior to Admission medications   Medication Sig Start Date End Date Taking? Authorizing Provider  acetaminophen (TYLENOL) 500 MG tablet Take 2 tablets (1,000 mg total) by mouth every 8 (eight) hours as needed. Patient taking differently: Take 500-1,000 mg by mouth every 8 (eight) hours as needed  (for pain or headaches).  08/26/16   Sherren Mocha, MD  aspirin EC 81 MG tablet Take 81 mg by mouth daily.    [provider]  atorvastatin (LIPITOR) 20 MG tablet Take 20 mg by mouth daily. 08/19/18   [provider]  B Complex-C (B-COMPLEX WITH VITAMIN C) tablet Take 1 tablet by mouth daily.    [provider]  ciclopirox (PENLAC) 8 % solution Apply topically at bedtime. Apply over nail and surrounding skin. Apply daily over previous coat. After seven (7) days, may remove with alcohol and continue cycle. 03/09/19   Vivi Barrack, DPM  diphenhydrAMINE (BENADRYL) 25 mg capsule Take 25-50 mg by mouth at bedtime.     [provider]  Dulaglutide 0.75 MG/0.5ML SOPN Inject into the skin. 03/02/19 11/25/21  [provider]  FLUoxetine (PROZAC) 40 MG capsule Take by mouth. 03/02/19   [provider]  gabapentin (NEURONTIN) 800 MG tablet Take 1 tablet (800 mg total) by mouth 4 (four) times daily. Office visit needed Patient taking differently: Take 800 mg by mouth 4 (four) times daily.  05/02/17   Trena Platt D, PA  gabapentin (NEURONTIN) 800 MG tablet TAKE ONE TABLET BY MOUTH FOUR TIMES A DAY 01/18/19   [provider]  glucose blood test strip Use to check blood sugar 3 time(s) daily 07/07/17 03/01/26  [provider]  HYDROmorphone (DILAUDID) 2 MG tablet Take 1 tablet (2 mg total) by mouth every 6 (six) hours as needed for severe pain (breakthrough post-op pain). 08/26/18   Abelino Derrick, PA-C  insulin aspart (NOVOLOG FLEXPEN) 100 UNIT/ML FlexPen Inject 8 Units into the skin 3 (three) times daily with meals. 07/22/17   Ghimire, Werner Lean, MD  Insulin Glargine (LANTUS) 100 UNIT/ML Solostar Pen Inject 25 Units into the skin daily at 10 pm. 07/22/17   Ghimire, Werner Lean, MD  Insulin Pen Needle 32G X 8 MM MISC Use as directed 07/22/17   Ghimire, Werner Lean, MD  Lancets (FREESTYLE) lancets Use to check blood sugar 3 time(s) daily. 07/07/17  03/02/27  [provider]  metFORMIN (GLUCOPHAGE) 500 MG tablet Take 1 tablet (500 mg total) by mouth 2 (two) times daily with a meal. Patient taking differently: Take 500 mg by mouth at bedtime.  07/22/17   Ghimire, Werner Lean, MD  metFORMIN (GLUCOPHAGE) 500 MG tablet Take by mouth. 03/02/19   [provider]  methocarbamol (ROBAXIN) 500 MG tablet Take 1-2 tablets (500-1,000 mg total) by mouth 4 (four) times daily. 08/23/17   Guy Sandifer, PA  metoprolol succinate (TOPROL XL) 25 MG 24 hr tablet Take 1 tablet (25 mg total) by mouth daily. 08/26/18 08/26/19  Manson Passey, PA  Multiple Vitamin (MULTIVITAMIN) tablet Take 1 tablet by mouth daily.    [provider]  ondansetron (ZOFRAN-ODT) 4 MG disintegrating tablet Take 4 mg by mouth every  6 (six) hours as needed for nausea.  07/07/18   [provider]  oxyCODONE 10 MG TABS Take 1 tablet (10 mg total) by mouth every 4 (four) hours as needed for moderate pain or severe pain (pain score 7-10). 08/23/17   Guy Sandifer, PA  OZEMPIC, 1 MG/DOSE, 2 MG/1.5ML SOPN Inject 1 mg into the skin every Sunday.  08/16/18   [provider]  sulfamethoxazole-trimethoprim (BACTRIM DS) 800-160 MG tablet Take 1 tablet by mouth 2 (two) times daily. 06/11/19   Elvina Sidle, MD  traZODone (DESYREL) 100 MG tablet TAKE ONE TABLET BY MOUTH THREE TIMES A DAY -- NEED OFFICE VISIT Patient taking differently: Take 200-300 mg by mouth at bedtime.  01/20/17   Ethelda Chick, MD  varenicline (CHANTIX) 1 MG tablet Take 1 mg by mouth 2 (two) times daily.    [provider]  varenicline (CHANTIX) 1 MG tablet TAKE ONE TABLET BY MOUTH TWICE A DAY 02/19/19   [provider]    Family History Family History  Problem Relation Age of Onset  . Thyroid disease Mother   . Hypertension Father   . Anxiety disorder Sister   . Cancer Paternal Grandmother        prostate    Social History Social History   Tobacco  Use  . Smoking status: Former Smoker    Packs/day: 0.50    Years: 15.00    Pack years: 7.50    Types: Cigarettes    Quit date: 06/10/2017    Years since quitting: 2.0  . Smokeless tobacco: Never Used  . Tobacco comment: Pt on Chantix  Substance Use Topics  . Alcohol use: No    Alcohol/week: 0.0 standard drinks    Comment: Pt is a recovering alcoholic and has been sober since ~2006.  . Drug use: Yes    Types: Marijuana, Oxycodone, Hydrocodone    Comment: 2/11//2019 "nothing in the last year"     Allergies   Celebrex [celecoxib] and Wellbutrin [bupropion]   Review of Systems Review of Systems   Physical Exam Triage Vital Signs ED Triage Vitals  Enc Vitals Group     BP      Pulse      Resp      Temp      Temp src      SpO2      Weight      Height      Head Circumference      Peak Flow      Pain Score      Pain Loc      Pain Edu?      Excl. in GC?    No data found.  Updated Vital Signs BP (!) 89/60 (BP Location: Right Arm)   Pulse 63   Temp 98.2 F (36.8 C) (Oral)   Resp 20   Ht 5' (1.524 m)   Wt 78.5 kg   SpO2 93%   BMI 33.79 kg/m    Physical Exam Vitals and nursing note reviewed.  Constitutional:      Appearance: Normal appearance.  Cardiovascular:     Rate and Rhythm: Normal rate.  Pulmonary:     Effort: Pulmonary effort is normal.  Musculoskeletal:        General: Normal range of motion.     Cervical back: Normal range of motion and neck supple.  Skin:    General: Skin is warm and dry.  Neurological:     Mental Status: She is  alert.  Psychiatric:        Mood and Affect: Mood normal.      UC Treatments / Results  Labs (all labs ordered are listed, but only abnormal results are displayed) Labs Reviewed  POCT URINALYSIS DIP (MANUAL ENTRY) - Abnormal; Notable for the following components:      Result Value   Color, UA other (*)    Clarity, UA turbid (*)    Bilirubin, UA large (*)    Ketones, POC UA small (15) (*)    Blood, UA  large (*)    Protein Ur, POC >=300 (*)    Urobilinogen, UA 2.0 (*)    Leukocytes, UA Large (3+) (*)    All other components within normal limits  URINE CULTURE    EKG   Radiology No results found.  Procedures Procedures (including critical care time)  Medications Ordered in UC Medications - No data to display  Initial Impression / Assessment and Plan / UC Course  I have reviewed the triage vital signs and the nursing notes.  Pertinent labs & imaging results that were available during my care of the patient were reviewed by me and considered in my medical decision making (see chart for details).    Final Clinical Impressions(s) / UC Diagnoses   Final diagnoses:  Hematuria, unspecified type  Lower urinary tract infectious disease     Discharge Instructions     Strategies to prevent and/or treat COVID-19:  Vitamin D3 5000 IU (125 mcg) daily Vitamin C 500 mg twice daily Zinc 50 to 75 mg daily  Listerine type mouthwash 4 times a day      ED Prescriptions    Medication Sig Dispense Auth. Provider   sulfamethoxazole-trimethoprim (BACTRIM DS) 800-160 MG tablet Take 1 tablet by mouth 2 (two) times daily. 10 tablet Robyn Haber, MD     PDMP not reviewed this encounter.   Robyn Haber, MD 06/11/19 [phone]

## 2019-06-13 LAB — URINE CULTURE
MICRO NUMBER:: 10102853
SPECIMEN QUALITY:: ADEQUATE

## 2019-07-10 ENCOUNTER — Emergency Department
Admission: EM | Admit: 2019-07-10 | Discharge: 2019-07-10 | Disposition: A | Discharge status: Home / Self Care | Payer: 59 | Source: Home / Self Care

## 2019-07-10 ENCOUNTER — Other Ambulatory Visit: Payer: Self-pay

## 2019-07-10 ENCOUNTER — Emergency Department (INDEPENDENT_AMBULATORY_CARE_PROVIDER_SITE_OTHER): Payer: 59

## 2019-07-10 DIAGNOSIS — R05 Cough: Secondary | ICD-10-CM

## 2019-07-10 DIAGNOSIS — R059 Cough, unspecified: Secondary | ICD-10-CM

## 2019-07-10 DIAGNOSIS — I959 Hypotension, unspecified: Secondary | ICD-10-CM

## 2019-07-10 DIAGNOSIS — J209 Acute bronchitis, unspecified: Secondary | ICD-10-CM

## 2019-07-10 MED ORDER — PREDNISONE 50 MG PO TABS: 50.0000 mg | ORAL_TABLET | Freq: Every day | ORAL | 0 refills | Status: AC

## 2019-07-10 MED ORDER — ALBUTEROL SULFATE HFA 108 (90 BASE) MCG/ACT IN AERS
2.0000 | INHALATION_SPRAY | RESPIRATORY_TRACT | Status: DC | PRN
  Administered 2019-07-10: 2 via RESPIRATORY_TRACT

## 2019-07-10 MED ORDER — HYDROCODONE-HOMATROPINE 5-1.5 MG/5ML PO SYRP: 5.0000 mL | ORAL_SOLUTION | Freq: Every evening | ORAL | 0 refills | Status: DC | PRN

## 2019-07-10 MED ORDER — AMOXICILLIN-POT CLAVULANATE 875-125 MG PO TABS: 1.0000 | ORAL_TABLET | Freq: Two times a day (BID) | ORAL | 0 refills | Status: DC

## 2019-07-10 NOTE — ED Triage Notes (Signed)
Patient presents to Urgent Care with complaints of possible recurrent pneumonia since two days ago; has been coughing up large amounts of greenish yellowish phlegm. Patient reports she last had pneumonia this past January as well as November 2020, pt would like to know why she keeps getting it.  Has has multiple covid tests and all have been negative.

## 2019-07-10 NOTE — ED Provider Notes (Signed)
[facility]    CSN: [id number] Arrival date & time: 07/10/19  1635      History   Chief Complaint Chief Complaint  Patient presents with  . Cough    HPI Brittney Sawyer is a 54 y.o. female.   HPI   Presents today with a concern of possible recurrent pneumonia.  She endorses shortness of breath, a persistent cough which is occasionally productive of large copious bouts of greenish-yellow sputum.  Patient has had pneumonia initially November and diagnosed again in January.  She has had multiple negative COVID-19 test.  She has a low-grade temperature today.  She has had no recent exposures to anyone known to be positive for COVID-19. No prior pulmonology work-up and patient has a history of smoking.  Past Medical History:  Diagnosis Date  . Anxiety   . Arthritis    "all my joints" (06/20/2017)  . Depression   . Diabetes mellitus type 2, controlled (HCC) 05/20/2014  . GERD (gastroesophageal reflux disease)    "gone w/diet revision" (06/20/2017)  . Hyperlipidemia   . Hypertension   . Infection    right knee replacement  . Neuropathy    post back surgery  . Pneumonia X 1  . Poorly controlled diabetes mellitus (HCC) 07/26/2017  . Right knee DJD 01/27/2013  . Spinal headache   . Substance abuse (HCC)    Alcohol - sober since 1996    Patient Active Problem List   Diagnosis Date Noted  . VT (ventricular tachycardia) (HCC) 08/24/2018  . Poorly controlled diabetes mellitus (HCC) 07/26/2017  . DKA (diabetic ketoacidoses) (HCC) 07/20/2017  . Prosthetic joint infection (HCC)   . Septic infrapatellar bursitis of right knee   . S/P revision of total knee 06/20/2017  . Septic joint (HCC) 06/09/2017  . History of tobacco use 08/31/2016  . Diabetes mellitus type 2, controlled (HCC) 05/20/2014  . Right knee DJD 01/27/2013  . Vaginal candidiasis 01/27/2013  . Metabolic syndrome 12/20/2012  . Obesity (BMI 35.0-39.9 without comorbidity) 12/19/2012  . History of substance use  disorder 01/07/2012  . Menopause 11/25/2011  . DDD (degenerative disc disease), lumbar 11/25/2011  . HTN (hypertension) 10/21/2011  . Mood disorder (HCC) 10/21/2011  . Chronic pain 10/21/2011    Past Surgical History:  Procedure Laterality Date  . AMPUTATION Right 10/03/2015   Procedure: Right Foot 3rd Ray Amputation;  Surgeon: Nadara Mustard, MD;  Location: [facility] OR;  Service: Orthopedics;  Laterality: Right;  . BACK SURGERY    . BUNIONECTOMY WITH HAMMERTOE RECONSTRUCTION Bilateral   . EXCISIONAL TOTAL KNEE ARTHROPLASTY WITH ANTIBIOTIC SPACERS Right 06/20/2017  . EXCISIONAL TOTAL KNEE ARTHROPLASTY WITH ANTIBIOTIC SPACERS Right 06/20/2017   Procedure: EXCISION RIGHT TOTAL KNEE ARTHROPLASTY WITH ANTIBIOTIC SPACERS;  Surgeon: Dannielle Huh, MD;  Location: [facility] OR;  Service: Orthopedics;  Laterality: Right;  . ICD IMPLANT N/A 08/25/2018   Procedure: ICD IMPLANT;  Surgeon: Marinus Maw, MD;  Location: [facility] INVASIVE CV LAB;  Service: Cardiovascular;  Laterality: N/A;  . JOINT REPLACEMENT    . LEFT HEART CATH AND CORONARY ANGIOGRAPHY N/A 08/25/2018   Procedure: LEFT HEART CATH AND CORONARY ANGIOGRAPHY;  Surgeon: Dolores Patty, MD;  Location: [facility] INVASIVE CV LAB;  Service: Cardiovascular;  Laterality: N/A;  . POSTERIOR LUMBAR FUSION  11/2007   L4, L5   . POSTERIOR LUMBAR FUSION  01/2008     second surgery L2-L5 and S1  . SHOULDER ARTHROSCOPY W/ ROTATOR CUFF REPAIR Left 01/2017  . TOTAL KNEE ARTHROPLASTY  Right 01/26/2013   Procedure: TOTAL KNEE ARTHROPLASTY;  Surgeon: Thera Flake., MD;  Location: [facility] OR;  Service: Orthopedics;  Laterality: Right;  . TOTAL KNEE REVISION Right 08/22/2017   Procedure: RIGHT TOTAL KNEE REVISION WUITH REMOVAL OF ANTIBIOTIC SPACER;  Surgeon: Dannielle Huh, MD;  Location: [facility] OR;  Service: Orthopedics;  Laterality: Right;  . TUBAL LIGATION    . VAGINAL HYSTERECTOMY      OB History   No obstetric history on file.      Home Medications    Prior to Admission  medications   Medication Sig Start Date End Date Taking? Authorizing Provider  acetaminophen (TYLENOL) 500 MG tablet Take 2 tablets (1,000 mg total) by mouth every 8 (eight) hours as needed. Patient taking differently: Take 500-1,000 mg by mouth every 8 (eight) hours as needed (for pain or headaches).  08/26/16   Sherren Mocha, MD  amoxicillin-clavulanate (AUGMENTIN) 875-125 MG tablet Take 1 tablet by mouth 2 (two) times daily. 07/10/19   Bing Neighbors, FNP  aspirin EC 81 MG tablet Take 81 mg by mouth daily.    [provider]  atorvastatin (LIPITOR) 20 MG tablet Take 20 mg by mouth daily. 08/19/18   [provider]  B Complex-C (B-COMPLEX WITH VITAMIN C) tablet Take 1 tablet by mouth daily.    [provider]  ciclopirox (PENLAC) 8 % solution Apply topically at bedtime. Apply over nail and surrounding skin. Apply daily over previous coat. After seven (7) days, may remove with alcohol and continue cycle. 03/09/19   Vivi Barrack, DPM  diphenhydrAMINE (BENADRYL) 25 mg capsule Take 25-50 mg by mouth at bedtime.     [provider]  Dulaglutide 0.75 MG/0.5ML SOPN Inject into the skin. 03/02/19 11/25/21  [provider]  FLUoxetine (PROZAC) 40 MG capsule Take by mouth. 03/02/19   [provider]  gabapentin (NEURONTIN) 800 MG tablet Take 1 tablet (800 mg total) by mouth 4 (four) times daily. Office visit needed Patient taking differently: Take 800 mg by mouth 4 (four) times daily.  05/02/17   Trena Platt D, PA  gabapentin (NEURONTIN) 800 MG tablet TAKE ONE TABLET BY MOUTH FOUR TIMES A DAY 01/18/19   [provider]  glucose blood test strip Use to check blood sugar 3 time(s) daily 07/07/17 03/01/26  [provider]  HYDROcodone-homatropine (HYCODAN) 5-1.5 MG/5ML syrup Take 5 mLs by mouth at bedtime as needed and may repeat dose one time if needed for cough. 07/10/19   Bing Neighbors, FNP  HYDROmorphone (DILAUDID) 2 MG  tablet Take 1 tablet (2 mg total) by mouth every 6 (six) hours as needed for severe pain (breakthrough post-op pain). 08/26/18   Abelino Derrick, PA-C  insulin aspart (NOVOLOG FLEXPEN) 100 UNIT/ML FlexPen Inject 8 Units into the skin 3 (three) times daily with meals. 07/22/17   Ghimire, Werner Lean, MD  Insulin Glargine (LANTUS) 100 UNIT/ML Solostar Pen Inject 25 Units into the skin daily at 10 pm. 07/22/17   Ghimire, Werner Lean, MD  Insulin Pen Needle 32G X 8 MM MISC Use as directed 07/22/17   Ghimire, Werner Lean, MD  Lancets (FREESTYLE) lancets Use to check blood sugar 3 time(s) daily. 07/07/17 03/02/27  [provider]  metFORMIN (GLUCOPHAGE) 500 MG tablet Take 1 tablet (500 mg total) by mouth 2 (two) times daily with a meal. Patient taking differently: Take 500 mg by mouth at bedtime.  07/22/17   Ghimire, Werner Lean, MD  metFORMIN (GLUCOPHAGE)  500 MG tablet Take by mouth. 03/02/19   [provider]  methocarbamol (ROBAXIN) 500 MG tablet Take 1-2 tablets (500-1,000 mg total) by mouth 4 (four) times daily. 08/23/17   [name], PA  metoprolol succinate (TOPROL XL) 25 MG 24 hr tablet Take 1 tablet (25 mg total) by mouth daily. 08/26/18 08/26/19  Leanor Kail, PA  Multiple Vitamin (MULTIVITAMIN) tablet Take 1 tablet by mouth daily.    [provider]  ondansetron (ZOFRAN-ODT) 4 MG disintegrating tablet Take 4 mg by mouth every 6 (six) hours as needed for nausea.  07/07/18   [provider]  oxyCODONE 10 MG TABS Take 1 tablet (10 mg total) by mouth every 4 (four) hours as needed for moderate pain or severe pain (pain score 7-10). 08/23/17   [name], PA  OZEMPIC, 1 MG/DOSE, 2 MG/1.5ML SOPN Inject 1 mg into the skin every Sunday.  08/16/18   [provider]  predniSONE (DELTASONE) 50 MG tablet Take 1 tablet (50 mg total) by mouth daily for 5 days. 07/10/19 07/15/19  Scot Jun, FNP  sulfamethoxazole-trimethoprim (BACTRIM DS) 800-160 MG tablet  Take 1 tablet by mouth 2 (two) times daily. 06/11/19   Robyn Haber, MD  traZODone (DESYREL) 100 MG tablet TAKE ONE TABLET BY MOUTH THREE TIMES A DAY -- NEED OFFICE VISIT Patient taking differently: Take 200-300 mg by mouth at bedtime.  01/20/17   Wardell Honour, MD  varenicline (CHANTIX) 1 MG tablet Take 1 mg by mouth 2 (two) times daily.    [provider]  varenicline (CHANTIX) 1 MG tablet TAKE ONE TABLET BY MOUTH TWICE A DAY 02/19/19   [provider]    Family History Family History  Problem Relation Age of Onset  . Thyroid disease Mother   . Hypertension Father   . Anxiety disorder Sister   . Cancer Paternal Grandmother        prostate    Social History Social History   Tobacco Use  . Smoking status: Former Smoker    Packs/day: 0.50    Years: 15.00    Pack years: 7.50    Types: Cigarettes    Quit date: 06/10/2017    Years since quitting: 2.0  . Smokeless tobacco: Never Used  . Tobacco comment: Pt on Chantix  Substance Use Topics  . Alcohol use: No    Alcohol/week: 0.0 standard drinks    Comment: Pt is a recovering alcoholic and has been sober since ~2006.  . Drug use: Yes    Types: Marijuana, Oxycodone, Hydrocodone    Comment: 2/11//2019 "nothing in the last year"     Allergies   Celebrex [celecoxib] and Wellbutrin [bupropion]   Review of Systems Review of Systems Pertinent negatives listed in HPI Physical Exam Triage Vital Signs ED Triage Vitals  Enc Vitals Group     BP 07/10/19 1655 90/60     Pulse Rate 07/10/19 1655 79     Resp 07/10/19 1655 18     Temp 07/10/19 1655 99.1 F (37.3 C)     Temp Source 07/10/19 1655 Oral     SpO2 07/10/19 1655 99 %     Weight 07/10/19 1652 175 lb (79.4 kg)     Height 07/10/19 1652 5' (1.524 m)     Head Circumference --      Peak Flow --      Pain Score 07/10/19 1652 4     Pain Loc --      Pain Edu? --  Excl. in GC? --    No data found.  Updated Vital Signs BP 90/60 (BP Location: Left  Arm)   Pulse 79   Temp 99.1 F (37.3 C) (Oral)   Resp 18   Ht 5' (1.524 m)   Wt 175 lb (79.4 kg)   SpO2 99%   BMI 34.18 kg/m   Visual Acuity Right Eye Distance:   Left Eye Distance:   Bilateral Distance:    Right Eye Near:   Left Eye Near:    Bilateral Near:     Physical Exam Constitutional:      Appearance: Normal appearance. She is not ill-appearing or toxic-appearing.  HENT:     Head: Normocephalic and atraumatic.     Right Ear: External ear normal.     Left Ear: External ear normal.     Nose: Nose normal.  Eyes:     Extraocular Movements: Extraocular movements intact.     Pupils: Pupils are equal, round, and reactive to light.  Cardiovascular:     Rate and Rhythm: Normal rate and regular rhythm.  Pulmonary:     Effort: Pulmonary effort is normal.     Breath sounds: Rhonchi present. No wheezing or rales.  Chest:     Chest wall: No tenderness.  Musculoskeletal:        General: Normal range of motion.     Cervical back: Normal range of motion. No rigidity.  Lymphadenopathy:     Cervical: No cervical adenopathy.  Skin:    General: Skin is warm and dry.  Neurological:     Mental Status: She is oriented to person, place, and time.  Psychiatric:        Mood and Affect: Mood normal.      UC Treatments / Results  Labs (all labs ordered are listed, but only abnormal results are displayed) Labs Reviewed - No data to display  EKG   Radiology DG Chest 2 View  Result Date: 07/10/2019 CLINICAL DATA:  54 year old female with cough. EXAM: CHEST - 2 VIEW COMPARISON:  Chest radiograph dated 08/26/2018. FINDINGS: Mild chronic bronchitic changes. No focal consolidation, pleural effusion, or pneumothorax. There is a 2 cm left upper lobe nodule as seen on the CT of 09/06/2016, previously described as a hamartoma. The cardiac silhouette is within normal limits. Left pectoral AICD device. No acute osseous pathology. IMPRESSION: 1. No acute cardiopulmonary process. 2. Left  upper lobe hamartoma. Electronically Signed   By: Elgie Collard M.D.   On: 07/10/2019 17:23    Procedures Procedures (including critical care time)  Medications Ordered in UC Medications  albuterol (VENTOLIN HFA) 108 (90 Base) MCG/ACT inhaler 2 puff (2 puffs Inhalation Given 07/10/19 1810)    Initial Impression / Assessment and Plan / UC Course  I have reviewed the triage vital signs and the nursing notes.  Pertinent labs & imaging results that were available during my care of the patient were reviewed by me and considered in my medical decision making (see chart for details).     Final Clinical Impressions(s) / UC Diagnoses   Final diagnoses:  Cough  Acute bronchitis, unspecified organism  Prednisone taper 40 mg x 5 days. Augmentin 1 tablet twice daily x 10 days. Start Albuterol inhaler 2 puffs every 4-6 hours as needed. Follow-up with [facility] to establish care and request a pulmonology referral. Precautions and red flags discussed.  Patient verbalizes understanding.   Discharge Instructions   None    ED Prescriptions    Medication  Sig Dispense Auth. Provider   amoxicillin-clavulanate (AUGMENTIN) 875-125 MG tablet Take 1 tablet by mouth 2 (two) times daily. 20 tablet Bing Neighbors, FNP   predniSONE (DELTASONE) 50 MG tablet Take 1 tablet (50 mg total) by mouth daily for 5 days. 5 tablet Bing Neighbors, FNP   HYDROcodone-homatropine [facility]) 5-1.5 MG/5ML syrup Take 5 mLs by mouth at bedtime as needed and may repeat dose one time if needed for cough. 120 mL Bing Neighbors, FNP     I have reviewed the PDMP during this encounter.   Bing Neighbors, FNP 07/11/19 1140

## 2019-08-09 NOTE — Progress Notes (Signed)
Electrophysiology Office Note Date: 08/10/2019  ID:  Brittney Sawyer, DOB 1965/07/20, MRN [id number]  PCP: [name], FNP Primary Cardiologist: No primary care provider on file. Electrophysiologist: Dr. Ladona Ridgel  CC: Routine ICD follow-up  Brittney Sawyer is a 54 y.o. female seen today for Dr. Ladona Ridgel.  They present today for routine electrophysiology followup.  Since last being seen in our clinic, the patient reports doing OK. She has had worsening dizziness over the past 3 months. Mostly with rapid standing. She has not had any recent changes to her medications. She is trying to use NOOM (Diet app) and is drinking likely 100+oz of water daily. She did try intermittent fasting (one meal a day) but felt worse. She assured me she is now eating breakfast, lunch, and dinner. They deny chest pain, palpitations, dyspnea, PND, orthopnea, nausea, vomiting, , syncope, edema, weight gain, or early satiety.  She has not had ICD shocks.   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 Anxiety   . Arthritis    "all my joints" (06/20/2017)  . Depression   . Diabetes mellitus type 2, controlled (HCC) 05/20/2014  . GERD (gastroesophageal reflux disease)    "gone w/diet revision" (06/20/2017)  . Hyperlipidemia   . Hypertension   . Infection    right knee replacement  . Neuropathy    post back surgery  . Pneumonia X 1  . Poorly controlled diabetes mellitus (HCC) 07/26/2017  . Right knee DJD 01/27/2013  . Spinal headache   . Substance abuse (HCC)    Alcohol - sober since 1996   Past Surgical History:  Procedure Laterality Date  . AMPUTATION Right 10/03/2015   Procedure: Right Foot 3rd Ray Amputation;  Surgeon: Nadara Mustard, MD;  Location[facility] OR;  Service: Orthopedics;  Laterality: Right;  . BACK SURGERY    . BUNIONECTOMY WITH HAMMERTOE  RECONSTRUCTION Bilateral   . EXCISIONAL TOTAL KNEE ARTHROPLASTY WITH ANTIBIOTIC SPACERS Right 06/20/2017  . EXCISIONAL TOTAL KNEE ARTHROPLASTY WITH ANTIBIOTIC SPACERS Right 06/20/2017   Procedure: EXCISION RIGHT TOTAL KNEE ARTHROPLASTY WITH ANTIBIOTIC SPACERS;  Surgeon: Dannielle Huh, MD;  Location: [facility] OR;  Service: Orthopedics;  Laterality: Right;  . ICD IMPLANT N/A 08/25/2018   Procedure: ICD IMPLANT;  Surgeon: Marinus Maw, MD;  Location: [facility] INVASIVE CV LAB;  Service: Cardiovascular;  Laterality: N/A;  . JOINT REPLACEMENT    . LEFT HEART CATH AND CORONARY ANGIOGRAPHY N/A 08/25/2018   Procedure: LEFT HEART CATH AND CORONARY ANGIOGRAPHY;  Surgeon: Dolores Patty, MD;  Location: [facility] INVASIVE CV LAB;  Service: Cardiovascular;  Laterality: N/A;  . POSTERIOR LUMBAR FUSION  11/2007   L4, L5   . POSTERIOR LUMBAR FUSION  01/2008     second surgery L2-L5 and S1  . SHOULDER ARTHROSCOPY W/ ROTATOR CUFF REPAIR Left 01/2017  . TOTAL KNEE ARTHROPLASTY Right 01/26/2013   Procedure: TOTAL KNEE ARTHROPLASTY;  Surgeon: Thera Flake., MD;  Location: [facility] OR;  Service: Orthopedics;  Laterality: Right;  . TOTAL KNEE REVISION Right 08/22/2017   Procedure: RIGHT TOTAL KNEE REVISION WUITH REMOVAL OF ANTIBIOTIC SPACER;  Surgeon: Dannielle Huh, MD;  Location: [facility] OR;  Service: Orthopedics;  Laterality: Right;  . TUBAL LIGATION    . VAGINAL HYSTERECTOMY      Current Outpatient Medications  Medication Sig Dispense Refill  . acetaminophen (TYLENOL)  500 MG tablet Take 2 tablets (1,000 mg total) by mouth every 8 (eight) hours as needed. (Patient taking differently: Take 500-1,000 mg by mouth every 8 (eight) hours as needed (for pain or headaches). ) 30 tablet 0  . aspirin EC 81 MG tablet Take 81 mg by mouth daily.    Marland Kitchen atorvastatin (LIPITOR) 20 MG tablet Take 20 mg by mouth daily.    . B Complex-C (B-COMPLEX WITH VITAMIN C) tablet Take 1 tablet by mouth daily.    Marland Kitchen FLUoxetine (PROZAC) 40 MG capsule Take by mouth.      . gabapentin (NEURONTIN) 800 MG tablet TAKE ONE TABLET BY MOUTH FOUR TIMES A DAY    . glucose blood test strip Use to check blood sugar 3 time(s) daily    . Multiple Vitamin (MULTIVITAMIN) tablet Take 1 tablet by mouth daily.    Marland Kitchen OZEMPIC, 1 MG/DOSE, 2 MG/1.5ML SOPN Inject 1 mg into the skin every Sunday.     . traZODone (DESYREL) 100 MG tablet TAKE ONE TABLET BY MOUTH THREE TIMES A DAY -- NEED OFFICE VISIT (Patient taking differently: Take 200-300 mg by mouth at bedtime. ) 90 tablet 0  . Triamcinolone Acetonide (NASACORT ALLERGY 24HR NA) Place into the nose.    . varenicline (CHANTIX) 1 MG tablet Take 1 mg by mouth 2 (two) times daily.    . metoprolol succinate (TOPROL XL) 25 MG 24 hr tablet Take 0.5 tablets (12.5 mg total) by mouth daily. 90 tablet 3   No current facility-administered medications for this visit.    Allergies:   Celebrex [celecoxib] and Wellbutrin [bupropion]   Social History: Social History   Socioeconomic History  . Marital status: Married    Spouse name: Not on file  . Number of children: Not on file  . Years of education: Not on file  . Highest education level: Not on file  Occupational History  . Not on file  Tobacco Use  . Smoking status: Former Smoker    Packs/day: 0.50    Years: 15.00    Pack years: 7.50    Types: Cigarettes    Quit date: 06/10/2017    Years since quitting: 2.1  . Smokeless tobacco: Never Used  . Tobacco comment: Pt on Chantix  Substance and Sexual Activity  . Alcohol use: No    Alcohol/week: 0.0 standard drinks    Comment: Pt is a recovering alcoholic and has been sober since ~2006.  . Drug use: Yes    Types: Marijuana, Oxycodone, Hydrocodone    Comment: 2/11//2019 "nothing in the last year"  . Sexual activity: Yes    Birth control/protection: Surgical  Other Topics Concern  . Not on file  Social History Narrative  . Not on file   Social Determinants of Health   Financial Resource Strain:   . Difficulty of Paying Living  Expenses:   Food Insecurity:   . Worried About Charity fundraiser in the Last Year:   . Arboriculturist in the Last Year:   Transportation Needs:   . Film/video editor (Medical):   Marland Kitchen Lack of Transportation (Non-Medical):   Physical Activity:   . Days of Exercise per Week:   . Minutes of Exercise per Session:   Stress:   . Feeling of Stress :   Social Connections:   . Frequency of Communication with Friends and Family:   . Frequency of Social Gatherings with Friends and Family:   . Attends Religious Services:   .  Active Member of Clubs or Organizations:   . Attends Banker Meetings:   Marland Kitchen Marital Status:   Intimate Partner Violence:   . Fear of Current or Ex-Partner:   . Emotionally Abused:   Marland Kitchen Physically Abused:   . Sexually Abused:     Family History: Family History  Problem Relation Age of Onset  . Thyroid disease Mother   . Hypertension Father   . Anxiety disorder Sister   . Cancer Paternal Grandmother        prostate    Review of Systems: All other systems reviewed and are otherwise negative except as noted above.   Physical Exam: Vitals:   08/10/19 1136  BP: (!) 102/54  Pulse: (!) 50  SpO2: 92%  Weight: 179 lb 6.4 oz (81.4 kg)  Height: 5' (1.524 m)     GEN- The patient is well appearing, alert and oriented x 3 today.   HEENT: normocephalic, atraumatic; sclera clear, conjunctiva pink; hearing intact; oropharynx clear; neck supple, no JVP Lymph- no cervical lymphadenopathy Lungs- Clear to ausculation bilaterally, normal work of breathing.  No wheezes, rales, rhonchi Heart- Regular rate and rhythm, no murmurs, rubs or gallops, PMI not laterally displaced GI- soft, non-tender, non-distended, bowel sounds present, no hepatosplenomegaly Extremities- no clubbing, cyanosis, or edema; DP/PT/radial pulses 2+ bilaterally MS- no significant deformity or atrophy Skin- warm and dry, no rash or lesion; ICD pocket well healed Psych- euthymic mood, full  affect Neuro- strength and sensation are intact  ICD interrogation- reviewed in detail today,  See PACEART report  EKG:  EKG is ordered today. Personal review shows A paced V sensed at 50 bpm, with PR interavl of 228 ms, and QRS of 100 ms   Recent Labs: 08/24/2018: ALT 30 08/25/2018: Hemoglobin 9.5; Magnesium 2.0; Platelets 265 08/26/2018: BUN 13; Creatinine, Ser 0.75; Potassium 3.8; Sodium 138   Wt Readings from Last 3 Encounters:  08/10/19 179 lb 6.4 oz (81.4 kg)  07/10/19 175 lb (79.4 kg)  06/11/19 173 lb (78.5 kg)     Other studies Reviewed: Additional studies/ records that were reviewed today include: Echo 08/2018 showed LVEF 40-45%,    Assessment and Plan:  1.  Chronic systolic dysfunction s/p [employer] dual chamber ICD  euvolemic today Stable on an appropriate medical regimen Normal ICD function See Pace Art report No changes today  2. VT She has not had any since her device was implanted  3. CAD Denies ischemic symptoms.   4. Dizziness Negative orthostatics in office today but overall soft BP (Systolic 96 sitting and 98 standing. Diastolic 50-60s) I encouraged hydration and discouraged fad diets. I encouraged to eat healthy, instead of less.  For safety with lightheadedness and soft pressures, we will decrease Toprol to 12.5 mg daily. Will not stop with h/o VT and CAD.   Current medicines are reviewed at length with the patient today.   The patient does not have concerns regarding her medicines.  The following changes were made today:  Decrease Toprol to 12.5 mg daily.  Labs/ tests ordered today include:   Orders Placed This Encounter  Procedures  . Basic Metabolic Panel (BMET)  . CBC  . TSH  . CUP PACEART INCLINIC DEVICE CHECK  . EKG 12-Lead   Disposition:   Follow up with Dr. Ladona Ridgel in 6 months.   Dustin Flock, PA-C  08/10/2019 1:08 PM  [facility] [address] [place] [address] [phone]  (office) [phone] (fax)

## 2019-08-10 ENCOUNTER — Other Ambulatory Visit: Payer: Self-pay

## 2019-08-10 ENCOUNTER — Encounter: Payer: Self-pay | Admitting: Student

## 2019-08-10 ENCOUNTER — Ambulatory Visit: Payer: 59 | Admitting: Student

## 2019-08-10 VITALS — BP 102/54 | HR 50 | Ht 60.0 in | Wt 179.4 lb

## 2019-08-10 DIAGNOSIS — R42 Dizziness and giddiness: Secondary | ICD-10-CM

## 2019-08-10 DIAGNOSIS — I255 Ischemic cardiomyopathy: Secondary | ICD-10-CM

## 2019-08-10 DIAGNOSIS — I472 Ventricular tachycardia, unspecified: Secondary | ICD-10-CM

## 2019-08-10 DIAGNOSIS — I10 Essential (primary) hypertension: Secondary | ICD-10-CM

## 2019-08-10 DIAGNOSIS — I5022 Chronic systolic (congestive) heart failure: Secondary | ICD-10-CM | POA: Diagnosis not present

## 2019-08-10 LAB — CUP PACEART INCLINIC DEVICE CHECK
Date Time Interrogation Session: 20210402122113
HighPow Impedance: 71 Ohm
Implantable Lead Implant Date: 20200417
Implantable Lead Implant Date: 20200417
Implantable Lead Location: 753859
Implantable Lead Location: 753860
Implantable Lead Model: 292
Implantable Lead Model: 7740
Implantable Lead Serial Number: 1018284
Implantable Lead Serial Number: 445149
Implantable Pulse Generator Implant Date: 20200417
Lead Channel Impedance Value: 511 Ohm
Lead Channel Impedance Value: 673 Ohm
Lead Channel Pacing Threshold Amplitude: 1.1 V
Lead Channel Pacing Threshold Amplitude: 1.3 V
Lead Channel Pacing Threshold Pulse Width: 0.4 ms
Lead Channel Pacing Threshold Pulse Width: 0.4 ms
Lead Channel Sensing Intrinsic Amplitude: 15.3 mV
Lead Channel Sensing Intrinsic Amplitude: 3.8 mV
Lead Channel Setting Pacing Amplitude: 3.5 V
Lead Channel Setting Pacing Amplitude: 3.5 V
Lead Channel Setting Pacing Pulse Width: 0.4 ms
Lead Channel Setting Sensing Sensitivity: 0.5 mV
Pulse Gen Serial Number: 552225

## 2019-08-10 MED ORDER — METOPROLOL SUCCINATE ER 25 MG PO TB24: 12.5000 mg | ORAL_TABLET | Freq: Every day | ORAL | 3 refills | Status: AC

## 2019-08-10 NOTE — Patient Instructions (Addendum)
Medication Instructions:  DECREASE METOPROLOL SUCCINATE TO 12.5 mg DAILY *If you need a refill on your cardiac medications before your next appointment, please call your pharmacy*   Lab Work:  TODAY BMET TSH CBC If you have labs (blood work) drawn today and your tests are completely normal, you will receive your results only by: Marland Kitchen MyChart Message (if you have MyChart) OR . A paper copy in the mail If you have any lab test that is abnormal or we need to change your treatment, we will call you to review the results.   Testing/Procedures: none   Follow-Up: At [facility], you and your health needs are our priority.  As part of our continuing mission to provide you with exceptional heart care, we have created designated Provider Care Teams.  These Care Teams include your primary Cardiologist (physician) and Advanced Practice Providers (APPs -  Physician Assistants and Nurse Practitioners) who all work together to provide you with the care you need, when you need it.  Your next appointment:   6 MONTHS  The format for your next appointment:   Either In Person or Virtual  Provider:   Dr Ladona Ridgel   Other Instructions Remote monitoring is used to monitor your ICD from home. This monitoring reduces the number of office visits required to check your device to one time per year. It allows [place] to keep an eye on the functioning of your device to ensure it is working properly. You are scheduled for a device check from home on 09/03/19. You may send your transmission at any time that day. If you have a wireless device, the transmission will be sent automatically. After your physician reviews your transmission, you will receive a postcard with your next transmission date.

## 2019-08-11 LAB — CBC
Hematocrit: 37 % (ref 34.0–46.6)
Hemoglobin: 12.8 g/dL (ref 11.1–15.9)
MCH: 32.1 pg (ref 26.6–33.0)
MCHC: 34.6 g/dL (ref 31.5–35.7)
MCV: 93 fL (ref 79–97)
Platelets: 363 10*3/uL (ref 150–450)
RBC: 3.99 x10E6/uL (ref 3.77–5.28)
RDW: 12.3 % (ref 11.7–15.4)
WBC: 9.2 10*3/uL (ref 3.4–10.8)

## 2019-08-11 LAB — BASIC METABOLIC PANEL
BUN/Creatinine Ratio: 30 — ABNORMAL HIGH (ref 9–23)
BUN: 30 mg/dL — ABNORMAL HIGH (ref 6–24)
CO2: 24 mmol/L (ref 20–29)
Calcium: 9.7 mg/dL (ref 8.7–10.2)
Chloride: 104 mmol/L (ref 96–106)
Creatinine, Ser: 0.99 mg/dL (ref 0.57–1.00)
GFR calc Af Amer: 75 mL/min/{1.73_m2} (ref >59)
GFR calc non Af Amer: 65 mL/min/{1.73_m2} (ref >59)
Glucose: 126 mg/dL — ABNORMAL HIGH (ref 65–99)
Potassium: 5.2 mmol/L (ref 3.5–5.2)
Sodium: 143 mmol/L (ref 134–144)

## 2019-08-11 LAB — TSH: TSH: 2.12 u[IU]/mL (ref 0.450–4.500)

## 2019-08-13 ENCOUNTER — Telehealth: Payer: Self-pay

## 2019-08-13 NOTE — Telephone Encounter (Signed)
-----   Message from Graciella Freer, PA-C sent at 08/11/2019  8:59 PM EDT ----- Labs are stable. Her K is borderline high at baseline. She is not on Ace/ARB/ARNi or potassium sparing diuretic. Can we please make sure she is not taking any potassium supplementation that is not listed? Thank you.

## 2019-08-13 NOTE — Telephone Encounter (Signed)
The patient has been notified of the lab result and verbalized understanding.  All questions (if any) were answered. Sigurd Sos, RN 08/13/2019 11:50 AM    I spoke to the patient about her potassium level and she will watch closely the foods/spices that she consumes and note potassium amount.    She said that she recently started the "noom" diet and will look into its' potassium content.

## 2019-08-13 NOTE — Telephone Encounter (Signed)
lpmtcb 4/5

## 2019-08-13 NOTE — Telephone Encounter (Signed)
-----   Message from Meya Clutter Andrew Tillery, PA-C sent at 08/11/2019  8:59 PM EDT ----- Labs are stable. Her K is borderline high at baseline. She is not on Ace/ARB/ARNi or potassium sparing diuretic. Can we please make sure she is not taking any potassium supplementation that is not listed? Thank you. 

## 2019-09-03 ENCOUNTER — Ambulatory Visit (INDEPENDENT_AMBULATORY_CARE_PROVIDER_SITE_OTHER): Payer: 59 | Admitting: *Deleted

## 2019-09-03 DIAGNOSIS — I472 Ventricular tachycardia, unspecified: Secondary | ICD-10-CM

## 2019-09-03 LAB — CUP PACEART REMOTE DEVICE CHECK
Battery Remaining Longevity: 156 mo
Battery Remaining Percentage: 100 %
Brady Statistic RA Percent Paced: 13 %
Brady Statistic RV Percent Paced: 0 %
Date Time Interrogation Session: 20210426041000
HighPow Impedance: 68 Ohm
Implantable Lead Implant Date: 20200417
Implantable Lead Implant Date: 20200417
Implantable Lead Location: 753859
Implantable Lead Location: 753860
Implantable Lead Model: 292
Implantable Lead Model: 7740
Implantable Lead Serial Number: 1018284
Implantable Lead Serial Number: 445149
Implantable Pulse Generator Implant Date: 20200417
Lead Channel Impedance Value: 503 Ohm
Lead Channel Impedance Value: 647 Ohm
Lead Channel Setting Pacing Amplitude: 3.5 V
Lead Channel Setting Pacing Amplitude: 3.5 V
Lead Channel Setting Pacing Pulse Width: 0.4 ms
Lead Channel Setting Sensing Sensitivity: 0.5 mV
Pulse Gen Serial Number: 552225

## 2019-09-03 NOTE — Progress Notes (Signed)
ICD Remote  

## 2019-09-13 DIAGNOSIS — Z96651 Presence of right artificial knee joint: Secondary | ICD-10-CM | POA: Diagnosis not present

## 2019-09-13 DIAGNOSIS — M009 Pyogenic arthritis, unspecified: Secondary | ICD-10-CM | POA: Diagnosis not present

## 2019-09-14 DIAGNOSIS — M009 Pyogenic arthritis, unspecified: Secondary | ICD-10-CM | POA: Diagnosis not present

## 2019-09-14 DIAGNOSIS — Z96651 Presence of right artificial knee joint: Secondary | ICD-10-CM | POA: Diagnosis not present

## 2019-12-03 ENCOUNTER — Ambulatory Visit (INDEPENDENT_AMBULATORY_CARE_PROVIDER_SITE_OTHER): Payer: 59 | Admitting: *Deleted

## 2019-12-03 DIAGNOSIS — I255 Ischemic cardiomyopathy: Secondary | ICD-10-CM | POA: Diagnosis not present

## 2019-12-03 LAB — CUP PACEART REMOTE DEVICE CHECK
Battery Remaining Longevity: 156 mo
Battery Remaining Percentage: 100 %
Brady Statistic RA Percent Paced: 9 %
Brady Statistic RV Percent Paced: 0 %
Date Time Interrogation Session: 20210726041100
HighPow Impedance: 72 Ohm
Implantable Lead Implant Date: 20200417
Implantable Lead Implant Date: 20200417
Implantable Lead Location: 753859
Implantable Lead Location: 753860
Implantable Lead Model: 292
Implantable Lead Model: 7740
Implantable Lead Serial Number: 1018284
Implantable Lead Serial Number: 445149
Implantable Pulse Generator Implant Date: 20200417
Lead Channel Impedance Value: 528 Ohm
Lead Channel Impedance Value: 657 Ohm
Lead Channel Setting Pacing Amplitude: 3.5 V
Lead Channel Setting Pacing Amplitude: 3.5 V
Lead Channel Setting Pacing Pulse Width: 0.4 ms
Lead Channel Setting Sensing Sensitivity: 0.5 mV
Pulse Gen Serial Number: 552225

## 2019-12-03 IMAGING — CT CT ABD-PELV W/O CM
2 of 4 series · 17 of 46 positions shown, 19 images · non-contrast
Comparison: CT pelvis 08/14/2013

CLINICAL DATA: Abdominal pain with nausea and vomiting

EXAM:
CT ABDOMEN AND PELVIS WITHOUT CONTRAST
TECHNIQUE: Multidetector CT imaging of the abdomen and pelvis was performed
following the standard protocol without IV contrast.

[Series 2: axial st · axial · 0.76mm/px · z∈[+1139,+1504]mm · 14 of 83 slices shown, 16 images]
[im 5/83  soft-tissue]
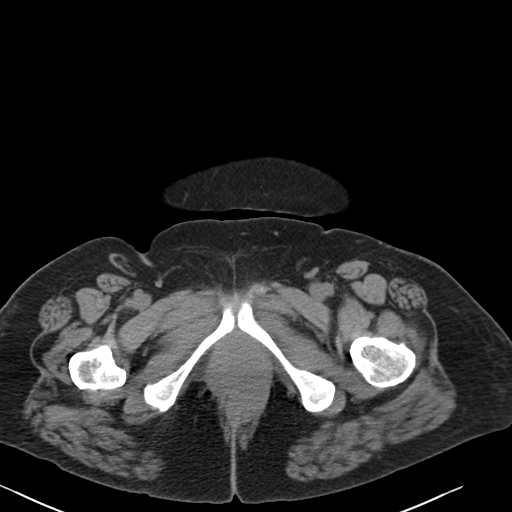
[im 5/83  bone]
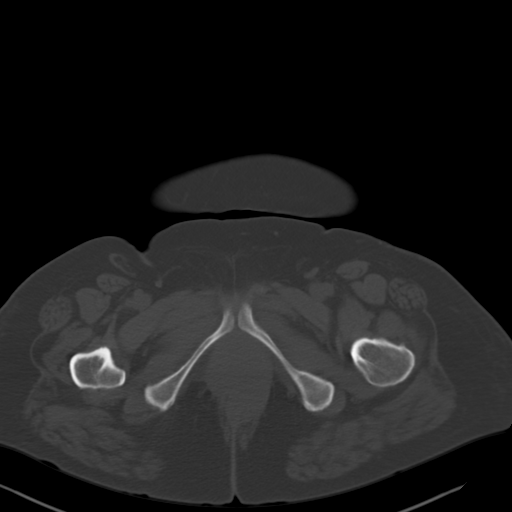
[im 9/83  soft-tissue]
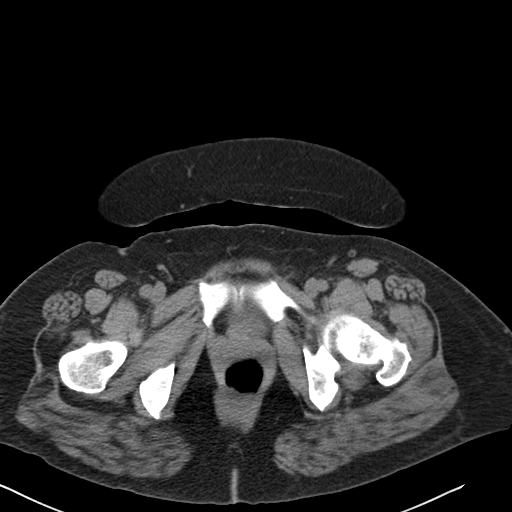
[im 18/83  soft-tissue]
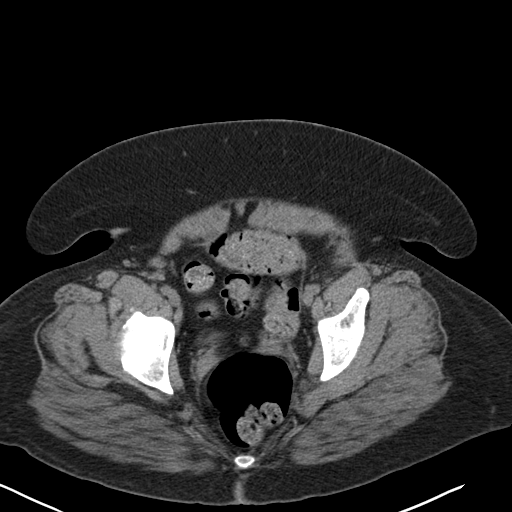
[im 22/83  soft-tissue]
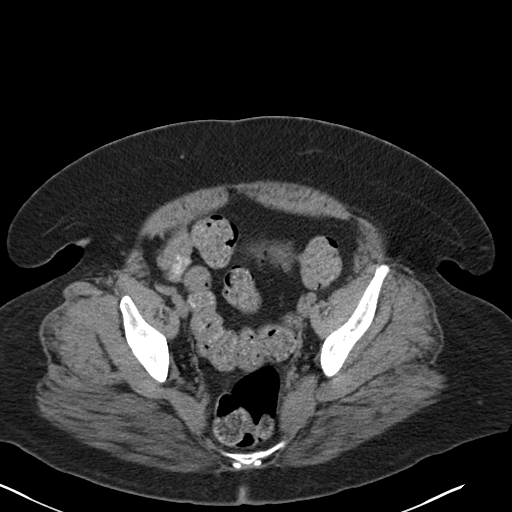
[im 26/83  soft-tissue]
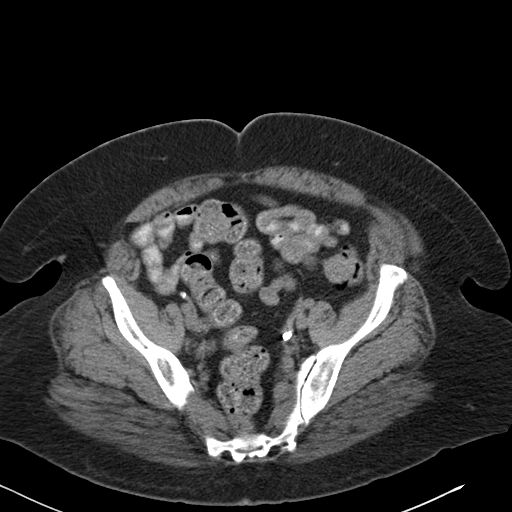
[im 35/83  soft-tissue]
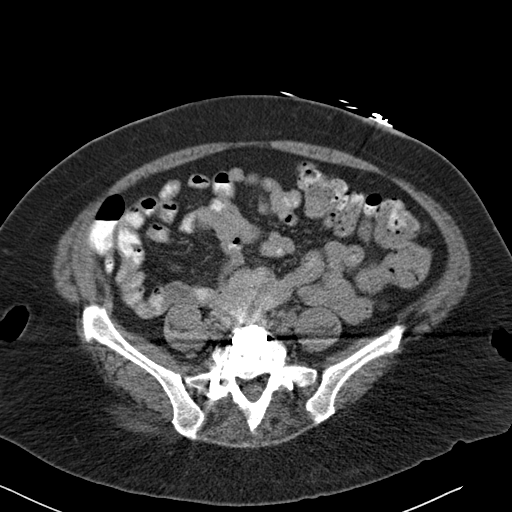
[im 39/83  soft-tissue]
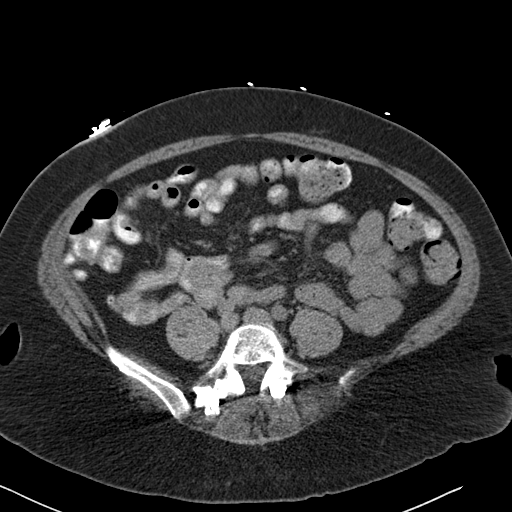
[im 44/83  soft-tissue]
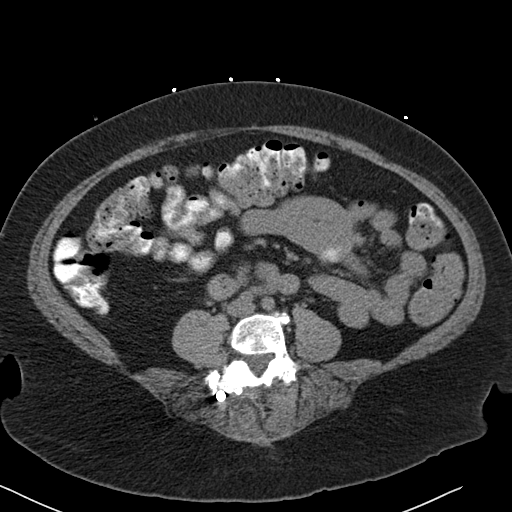
[im 48/83  soft-tissue]
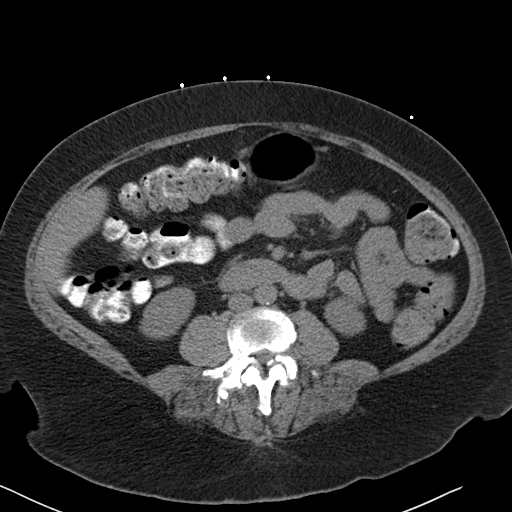
[im 48/83  bone]
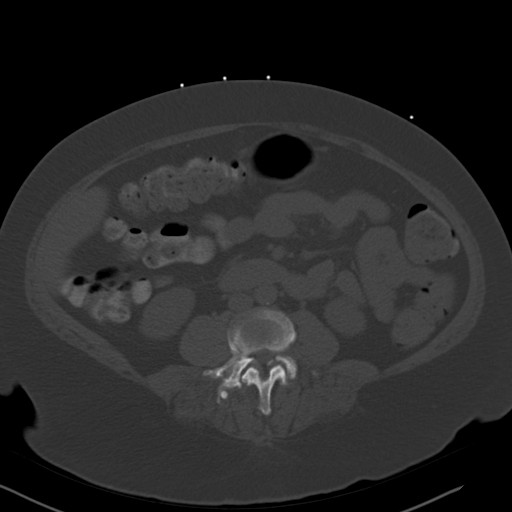
[im 57/83  soft-tissue]
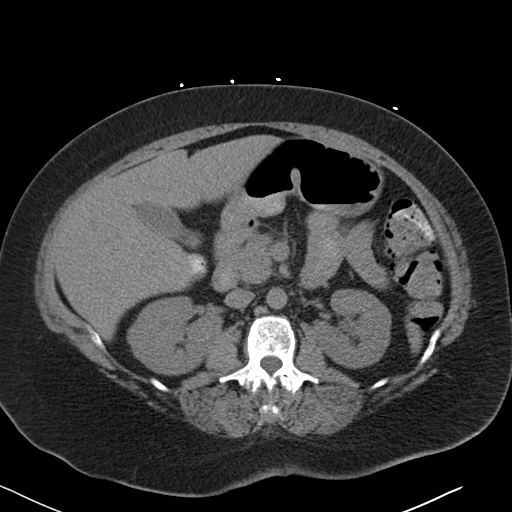
[im 61/83  soft-tissue]
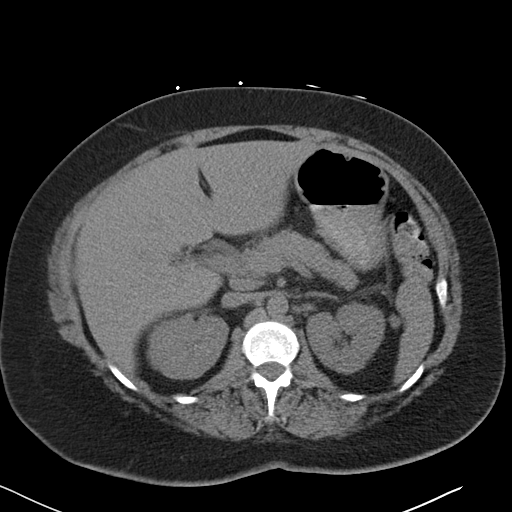
[im 65/83  soft-tissue]
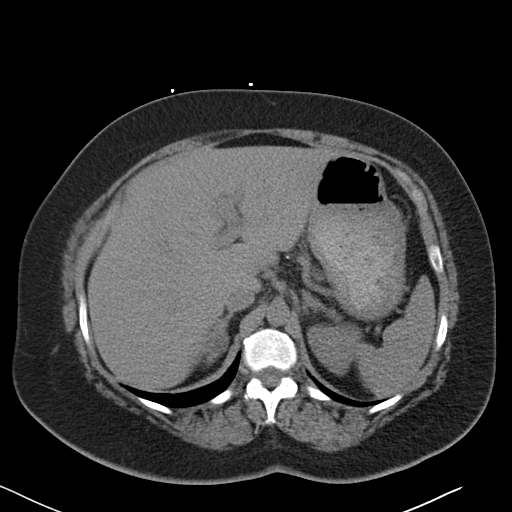
[im 74/83  soft-tissue]
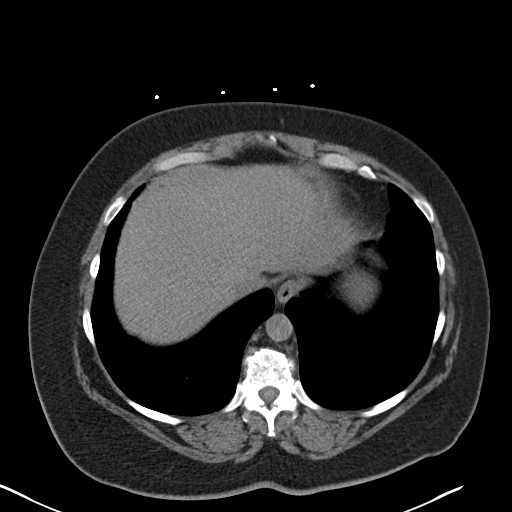
[im 78/83  soft-tissue]
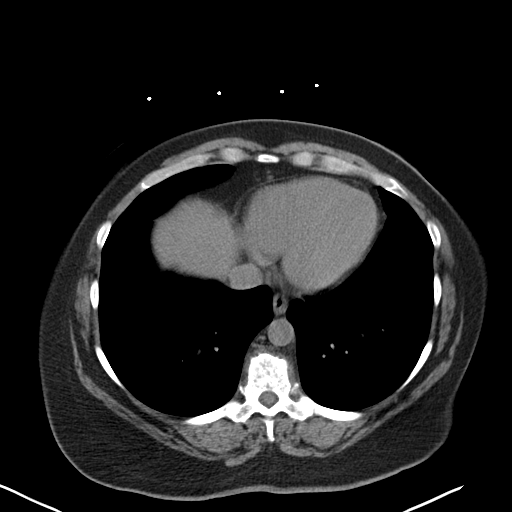

[Series 5: coronal st · coronal · 0.76mm/px · 3 of 99 slices shown]
[im 33/99  soft-tissue]
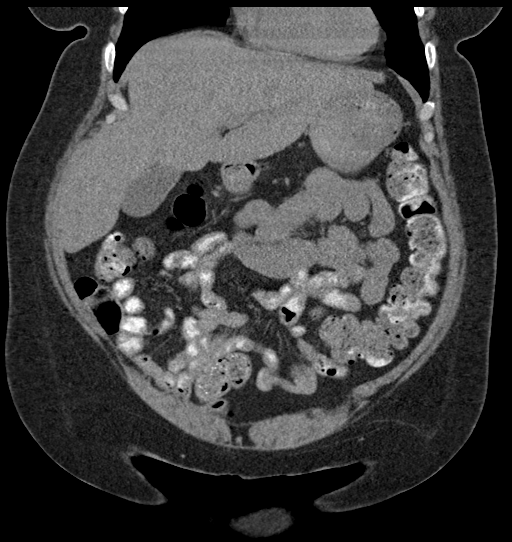
[im 44/99  soft-tissue]
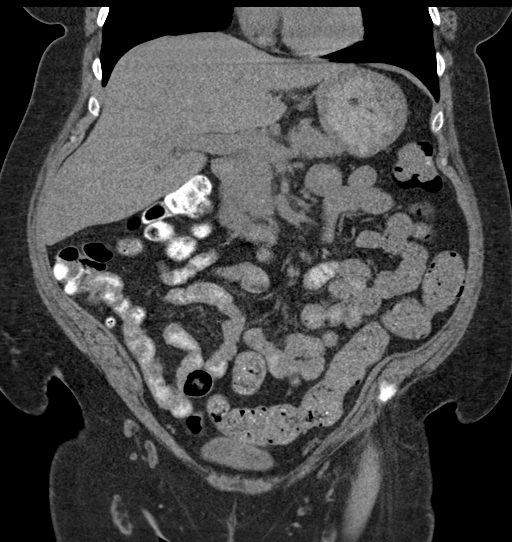
[im 55/99  soft-tissue]
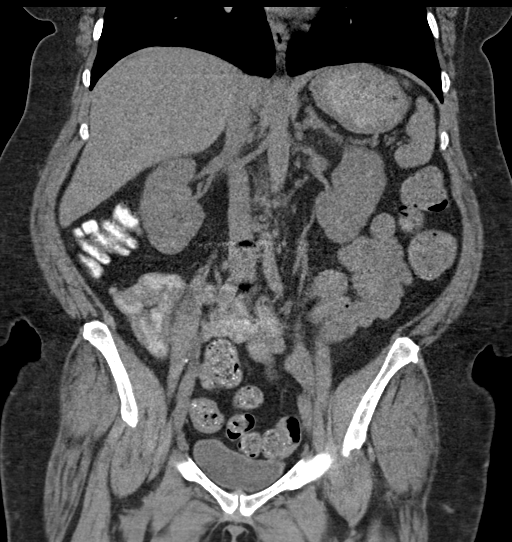

[17 of 46 positions shown; findings below may reference images not displayed]

FINDINGS: Lower chest: No basilar pulmonary nodules or pleural effusion. No
apical pericardial effusion.

Hepatobiliary: Normal hepatic contours and density. No visible
biliary dilatation. Normal gallbladder.

Pancreas: Normal parenchymal contours without ductal dilatation. No
peripancreatic fluid collection.

Spleen: Normal.

Adrenals/Urinary Tract:

--Adrenal glands: Normal.

--Right kidney/ureter: No hydronephrosis, perinephric stranding or
nephrolithiasis. No obstructing ureteral stones.

--Left kidney/ureter: Left lower pole renal calculus measures 9 mm.

--Urinary bladder: Normal appearance for the degree of distention.

Stomach/Bowel:

--Stomach/Duodenum: No hiatal hernia or other gastric abnormality.
Normal duodenal course.

--Small bowel: No dilatation or inflammation.

--Colon: No focal abnormality.  Stool throughout the colon.

--Appendix: Normal.

Vascular/Lymphatic: Normal course and caliber of the major abdominal
vessels. No abdominal or pelvic lymphadenopathy.

Reproductive: Bilateral tubal ligation.  Status post hysterectomy.

Musculoskeletal. L4-S1 fusion hardware.

Other: None.
IMPRESSION: 1. No acute abdominal or pelvic abnormality.
2. Stool throughout the entire nondilated colon.
3. 9 mm left lower pole nonobstructive renal calculus.

## 2019-12-06 NOTE — Progress Notes (Signed)
Remote ICD transmission.   

## 2020-01-22 DIAGNOSIS — F5104 Psychophysiologic insomnia: Secondary | ICD-10-CM | POA: Diagnosis not present

## 2020-01-22 DIAGNOSIS — E785 Hyperlipidemia, unspecified: Secondary | ICD-10-CM | POA: Diagnosis not present

## 2020-01-22 DIAGNOSIS — R69 Illness, unspecified: Secondary | ICD-10-CM | POA: Diagnosis not present

## 2020-01-22 DIAGNOSIS — I472 Ventricular tachycardia: Secondary | ICD-10-CM | POA: Diagnosis not present

## 2020-01-22 DIAGNOSIS — Z23 Encounter for immunization: Secondary | ICD-10-CM | POA: Diagnosis not present

## 2020-01-22 DIAGNOSIS — E1140 Type 2 diabetes mellitus with diabetic neuropathy, unspecified: Secondary | ICD-10-CM | POA: Diagnosis not present

## 2020-01-22 DIAGNOSIS — E782 Mixed hyperlipidemia: Secondary | ICD-10-CM | POA: Diagnosis not present

## 2020-01-22 DIAGNOSIS — E1169 Type 2 diabetes mellitus with other specified complication: Secondary | ICD-10-CM | POA: Diagnosis not present

## 2020-01-22 DIAGNOSIS — G8929 Other chronic pain: Secondary | ICD-10-CM | POA: Diagnosis not present

## 2020-01-22 DIAGNOSIS — E1142 Type 2 diabetes mellitus with diabetic polyneuropathy: Secondary | ICD-10-CM | POA: Diagnosis not present

## 2020-01-22 DIAGNOSIS — F3342 Major depressive disorder, recurrent, in full remission: Secondary | ICD-10-CM | POA: Diagnosis not present

## 2020-03-03 ENCOUNTER — Ambulatory Visit (INDEPENDENT_AMBULATORY_CARE_PROVIDER_SITE_OTHER): Payer: 59

## 2020-03-03 DIAGNOSIS — I472 Ventricular tachycardia, unspecified: Secondary | ICD-10-CM

## 2020-03-04 LAB — CUP PACEART REMOTE DEVICE CHECK
Battery Remaining Longevity: 156 mo
Battery Remaining Percentage: 100 %
Brady Statistic RA Percent Paced: 8 %
Brady Statistic RV Percent Paced: 0 %
Date Time Interrogation Session: 20211025041000
HighPow Impedance: 82 Ohm
Implantable Lead Implant Date: 20200417
Implantable Lead Implant Date: 20200417
Implantable Lead Location: 753859
Implantable Lead Location: 753860
Implantable Lead Model: 292
Implantable Lead Model: 7740
Implantable Lead Serial Number: 1018284
Implantable Lead Serial Number: 445149
Implantable Pulse Generator Implant Date: 20200417
Lead Channel Impedance Value: 540 Ohm
Lead Channel Impedance Value: 663 Ohm
Lead Channel Setting Pacing Amplitude: 3.5 V
Lead Channel Setting Pacing Amplitude: 3.5 V
Lead Channel Setting Pacing Pulse Width: 0.4 ms
Lead Channel Setting Sensing Sensitivity: 0.5 mV
Pulse Gen Serial Number: 552225

## 2020-03-06 NOTE — Progress Notes (Signed)
Remote ICD transmission.   

## 2020-04-08 DIAGNOSIS — M25541 Pain in joints of right hand: Secondary | ICD-10-CM | POA: Diagnosis not present

## 2020-04-08 DIAGNOSIS — Z1231 Encounter for screening mammogram for malignant neoplasm of breast: Secondary | ICD-10-CM | POA: Diagnosis not present

## 2020-04-08 DIAGNOSIS — N644 Mastodynia: Secondary | ICD-10-CM | POA: Diagnosis not present

## 2020-04-08 DIAGNOSIS — M25542 Pain in joints of left hand: Secondary | ICD-10-CM | POA: Diagnosis not present

## 2020-04-08 DIAGNOSIS — E1142 Type 2 diabetes mellitus with diabetic polyneuropathy: Secondary | ICD-10-CM | POA: Diagnosis not present

## 2020-06-03 ENCOUNTER — Ambulatory Visit (INDEPENDENT_AMBULATORY_CARE_PROVIDER_SITE_OTHER): Payer: No Typology Code available for payment source

## 2020-06-03 DIAGNOSIS — I472 Ventricular tachycardia, unspecified: Secondary | ICD-10-CM

## 2020-06-03 LAB — CUP PACEART REMOTE DEVICE CHECK
Battery Remaining Longevity: 156 mo
Battery Remaining Percentage: 100 %
Brady Statistic RA Percent Paced: 7 %
Brady Statistic RV Percent Paced: 0 %
Date Time Interrogation Session: 20220125042300
HighPow Impedance: 66 Ohm
Implantable Lead Implant Date: 20200417
Implantable Lead Implant Date: 20200417
Implantable Lead Location: 753859
Implantable Lead Location: 753860
Implantable Lead Model: 292
Implantable Lead Model: 7740
Implantable Lead Serial Number: 1018284
Implantable Lead Serial Number: 445149
Implantable Pulse Generator Implant Date: 20200417
Lead Channel Impedance Value: 486 Ohm
Lead Channel Impedance Value: 586 Ohm
Lead Channel Setting Pacing Amplitude: 3.5 V
Lead Channel Setting Pacing Amplitude: 3.5 V
Lead Channel Setting Pacing Pulse Width: 0.4 ms
Lead Channel Setting Sensing Sensitivity: 0.5 mV
Pulse Gen Serial Number: 552225

## 2020-06-14 NOTE — Progress Notes (Signed)
Remote ICD transmission.   

## 2020-09-02 ENCOUNTER — Ambulatory Visit (INDEPENDENT_AMBULATORY_CARE_PROVIDER_SITE_OTHER): Payer: No Typology Code available for payment source

## 2020-09-02 DIAGNOSIS — I255 Ischemic cardiomyopathy: Secondary | ICD-10-CM | POA: Diagnosis not present

## 2020-09-02 LAB — CUP PACEART REMOTE DEVICE CHECK
Battery Remaining Longevity: 156 mo
Battery Remaining Percentage: 100 %
Brady Statistic RA Percent Paced: 7 %
Brady Statistic RV Percent Paced: 0 %
Date Time Interrogation Session: 20220426041000
HighPow Impedance: 71 Ohm
Implantable Lead Implant Date: 20200417
Implantable Lead Implant Date: 20200417
Implantable Lead Location: 753859
Implantable Lead Location: 753860
Implantable Lead Model: 292
Implantable Lead Model: 7740
Implantable Lead Serial Number: 1018284
Implantable Lead Serial Number: 445149
Implantable Pulse Generator Implant Date: 20200417
Lead Channel Impedance Value: 490 Ohm
Lead Channel Impedance Value: 625 Ohm
Lead Channel Setting Pacing Amplitude: 3.5 V
Lead Channel Setting Pacing Amplitude: 3.5 V
Lead Channel Setting Pacing Pulse Width: 0.4 ms
Lead Channel Setting Sensing Sensitivity: 0.5 mV
Pulse Gen Serial Number: 552225

## 2020-09-23 NOTE — Progress Notes (Signed)
Remote ICD transmission.   

## 2020-11-28 ENCOUNTER — Encounter: Payer: Self-pay | Admitting: Internal Medicine

## 2020-11-28 NOTE — Progress Notes (Signed)
MRI clearance faxed to [facility] [phone].  See scanned media for details.

## 2020-12-02 ENCOUNTER — Ambulatory Visit (INDEPENDENT_AMBULATORY_CARE_PROVIDER_SITE_OTHER): Payer: 59

## 2020-12-02 DIAGNOSIS — I255 Ischemic cardiomyopathy: Secondary | ICD-10-CM

## 2020-12-02 LAB — CUP PACEART REMOTE DEVICE CHECK
Battery Remaining Longevity: 156 mo
Battery Remaining Percentage: 100 %
Brady Statistic RA Percent Paced: 7 %
Brady Statistic RV Percent Paced: 0 %
Date Time Interrogation Session: 20220726041100
HighPow Impedance: 61 Ohm
Implantable Lead Implant Date: 20200417
Implantable Lead Implant Date: 20200417
Implantable Lead Location: 753859
Implantable Lead Location: 753860
Implantable Lead Model: 292
Implantable Lead Model: 7740
Implantable Lead Serial Number: 1018284
Implantable Lead Serial Number: 445149
Implantable Pulse Generator Implant Date: 20200417
Lead Channel Impedance Value: 472 Ohm
Lead Channel Impedance Value: 687 Ohm
Lead Channel Setting Pacing Amplitude: 3.5 V
Lead Channel Setting Pacing Amplitude: 3.5 V
Lead Channel Setting Pacing Pulse Width: 0.4 ms
Lead Channel Setting Sensing Sensitivity: 0.5 mV
Pulse Gen Serial Number: 552225

## 2020-12-12 ENCOUNTER — Telehealth: Payer: Self-pay | Admitting: Internal Medicine

## 2020-12-12 NOTE — Telephone Encounter (Signed)
Kelly form [facility] would like to know if the patient can have an MRI. The patient may need to have surgery and the MRI would determine  the need for surgery or not.   Please reach out to [place] directly at [phone] or fax her at [phone]

## 2020-12-12 NOTE — Telephone Encounter (Signed)
Left message for Tresa Endo advising that patient's device is MRI compatible.  Provided return phone # and fax # if there is any paperwork that needs to be completed.

## 2020-12-29 NOTE — Progress Notes (Signed)
Remote ICD transmission.   

## 2021-03-03 ENCOUNTER — Ambulatory Visit (INDEPENDENT_AMBULATORY_CARE_PROVIDER_SITE_OTHER): Payer: 59

## 2021-03-03 DIAGNOSIS — I472 Ventricular tachycardia, unspecified: Secondary | ICD-10-CM

## 2021-03-03 LAB — CUP PACEART REMOTE DEVICE CHECK
Battery Remaining Longevity: 150 mo
Battery Remaining Percentage: 100 %
Brady Statistic RA Percent Paced: 9 %
Brady Statistic RV Percent Paced: 0 %
Date Time Interrogation Session: 20221025041300
HighPow Impedance: 68 Ohm
Implantable Lead Implant Date: 20200417
Implantable Lead Implant Date: 20200417
Implantable Lead Location: 753859
Implantable Lead Location: 753860
Implantable Lead Model: 292
Implantable Lead Model: 7740
Implantable Lead Serial Number: 1018284
Implantable Lead Serial Number: 445149
Implantable Pulse Generator Implant Date: 20200417
Lead Channel Impedance Value: 499 Ohm
Lead Channel Impedance Value: 669 Ohm
Lead Channel Setting Pacing Amplitude: 3.5 V
Lead Channel Setting Pacing Amplitude: 3.5 V
Lead Channel Setting Pacing Pulse Width: 0.4 ms
Lead Channel Setting Sensing Sensitivity: 0.5 mV
Pulse Gen Serial Number: 552225

## 2021-03-11 NOTE — Progress Notes (Signed)
Remote ICD transmission.   

## 2021-04-19 DIAGNOSIS — E1142 Type 2 diabetes mellitus with diabetic polyneuropathy: Secondary | ICD-10-CM | POA: Diagnosis not present

## 2021-04-19 DIAGNOSIS — X58XXXA Exposure to other specified factors, initial encounter: Secondary | ICD-10-CM | POA: Diagnosis not present

## 2021-04-19 DIAGNOSIS — E1169 Type 2 diabetes mellitus with other specified complication: Secondary | ICD-10-CM | POA: Diagnosis not present

## 2021-04-19 DIAGNOSIS — M19071 Primary osteoarthritis, right ankle and foot: Secondary | ICD-10-CM | POA: Diagnosis not present

## 2021-04-19 DIAGNOSIS — L03031 Cellulitis of right toe: Secondary | ICD-10-CM | POA: Diagnosis not present

## 2021-04-19 DIAGNOSIS — I4729 Other ventricular tachycardia: Secondary | ICD-10-CM | POA: Diagnosis not present

## 2021-04-19 DIAGNOSIS — R509 Fever, unspecified: Secondary | ICD-10-CM | POA: Diagnosis not present

## 2021-04-19 DIAGNOSIS — L97518 Non-pressure chronic ulcer of other part of right foot with other specified severity: Secondary | ICD-10-CM | POA: Diagnosis not present

## 2021-04-19 DIAGNOSIS — E11628 Type 2 diabetes mellitus with other skin complications: Secondary | ICD-10-CM | POA: Diagnosis not present

## 2021-04-19 DIAGNOSIS — L089 Local infection of the skin and subcutaneous tissue, unspecified: Secondary | ICD-10-CM | POA: Diagnosis not present

## 2021-04-19 DIAGNOSIS — E785 Hyperlipidemia, unspecified: Secondary | ICD-10-CM | POA: Diagnosis not present

## 2021-04-19 DIAGNOSIS — A419 Sepsis, unspecified organism: Secondary | ICD-10-CM | POA: Diagnosis not present

## 2021-04-19 DIAGNOSIS — E11621 Type 2 diabetes mellitus with foot ulcer: Secondary | ICD-10-CM | POA: Diagnosis not present

## 2021-04-19 DIAGNOSIS — R69 Illness, unspecified: Secondary | ICD-10-CM | POA: Diagnosis not present

## 2021-04-19 DIAGNOSIS — S91101A Unspecified open wound of right great toe without damage to nail, initial encounter: Secondary | ICD-10-CM | POA: Diagnosis not present

## 2021-04-20 DIAGNOSIS — I4729 Other ventricular tachycardia: Secondary | ICD-10-CM | POA: Diagnosis not present

## 2021-04-20 DIAGNOSIS — Z95810 Presence of automatic (implantable) cardiac defibrillator: Secondary | ICD-10-CM | POA: Diagnosis not present

## 2021-04-20 DIAGNOSIS — A419 Sepsis, unspecified organism: Secondary | ICD-10-CM | POA: Diagnosis not present

## 2021-04-20 DIAGNOSIS — E1142 Type 2 diabetes mellitus with diabetic polyneuropathy: Secondary | ICD-10-CM | POA: Diagnosis not present

## 2021-04-20 DIAGNOSIS — A4189 Other specified sepsis: Secondary | ICD-10-CM | POA: Diagnosis not present

## 2021-04-20 DIAGNOSIS — E11628 Type 2 diabetes mellitus with other skin complications: Secondary | ICD-10-CM | POA: Diagnosis not present

## 2021-04-20 DIAGNOSIS — L089 Local infection of the skin and subcutaneous tissue, unspecified: Secondary | ICD-10-CM | POA: Diagnosis not present

## 2021-04-21 DIAGNOSIS — A480 Gas gangrene: Secondary | ICD-10-CM | POA: Diagnosis not present

## 2021-04-21 DIAGNOSIS — L02611 Cutaneous abscess of right foot: Secondary | ICD-10-CM | POA: Diagnosis not present

## 2021-04-21 DIAGNOSIS — R7881 Bacteremia: Secondary | ICD-10-CM | POA: Diagnosis not present

## 2021-04-21 DIAGNOSIS — I517 Cardiomegaly: Secondary | ICD-10-CM | POA: Diagnosis not present

## 2021-04-21 DIAGNOSIS — M86171 Other acute osteomyelitis, right ankle and foot: Secondary | ICD-10-CM | POA: Diagnosis not present

## 2021-04-21 DIAGNOSIS — E11628 Type 2 diabetes mellitus with other skin complications: Secondary | ICD-10-CM | POA: Diagnosis not present

## 2021-04-21 DIAGNOSIS — L089 Local infection of the skin and subcutaneous tissue, unspecified: Secondary | ICD-10-CM | POA: Diagnosis not present

## 2021-04-21 DIAGNOSIS — I96 Gangrene, not elsewhere classified: Secondary | ICD-10-CM | POA: Diagnosis not present

## 2021-04-21 DIAGNOSIS — E1142 Type 2 diabetes mellitus with diabetic polyneuropathy: Secondary | ICD-10-CM | POA: Diagnosis not present

## 2021-04-21 DIAGNOSIS — Z95810 Presence of automatic (implantable) cardiac defibrillator: Secondary | ICD-10-CM | POA: Diagnosis not present

## 2021-04-21 DIAGNOSIS — R69 Illness, unspecified: Secondary | ICD-10-CM | POA: Diagnosis not present

## 2021-04-21 DIAGNOSIS — A4189 Other specified sepsis: Secondary | ICD-10-CM | POA: Diagnosis not present

## 2021-04-22 DIAGNOSIS — Z95810 Presence of automatic (implantable) cardiac defibrillator: Secondary | ICD-10-CM | POA: Diagnosis not present

## 2021-04-22 DIAGNOSIS — R7881 Bacteremia: Secondary | ICD-10-CM | POA: Diagnosis not present

## 2021-04-22 DIAGNOSIS — L089 Local infection of the skin and subcutaneous tissue, unspecified: Secondary | ICD-10-CM | POA: Diagnosis not present

## 2021-04-22 DIAGNOSIS — E11628 Type 2 diabetes mellitus with other skin complications: Secondary | ICD-10-CM | POA: Diagnosis not present

## 2021-04-22 DIAGNOSIS — E1142 Type 2 diabetes mellitus with diabetic polyneuropathy: Secondary | ICD-10-CM | POA: Diagnosis not present

## 2021-04-22 DIAGNOSIS — A4189 Other specified sepsis: Secondary | ICD-10-CM | POA: Diagnosis not present

## 2021-04-22 DIAGNOSIS — R69 Illness, unspecified: Secondary | ICD-10-CM | POA: Diagnosis not present

## 2021-04-23 DIAGNOSIS — E1142 Type 2 diabetes mellitus with diabetic polyneuropathy: Secondary | ICD-10-CM | POA: Diagnosis not present

## 2021-04-23 DIAGNOSIS — Z481 Encounter for planned postprocedural wound closure: Secondary | ICD-10-CM | POA: Diagnosis not present

## 2021-04-23 DIAGNOSIS — I071 Rheumatic tricuspid insufficiency: Secondary | ICD-10-CM | POA: Diagnosis not present

## 2021-04-23 DIAGNOSIS — Z95810 Presence of automatic (implantable) cardiac defibrillator: Secondary | ICD-10-CM | POA: Diagnosis not present

## 2021-04-23 DIAGNOSIS — A4189 Other specified sepsis: Secondary | ICD-10-CM | POA: Diagnosis not present

## 2021-04-23 DIAGNOSIS — R7881 Bacteremia: Secondary | ICD-10-CM | POA: Diagnosis not present

## 2021-04-23 DIAGNOSIS — L089 Local infection of the skin and subcutaneous tissue, unspecified: Secondary | ICD-10-CM | POA: Diagnosis not present

## 2021-04-23 DIAGNOSIS — R69 Illness, unspecified: Secondary | ICD-10-CM | POA: Diagnosis not present

## 2021-04-23 DIAGNOSIS — E11628 Type 2 diabetes mellitus with other skin complications: Secondary | ICD-10-CM | POA: Diagnosis not present

## 2021-04-24 DIAGNOSIS — E11628 Type 2 diabetes mellitus with other skin complications: Secondary | ICD-10-CM | POA: Diagnosis not present

## 2021-04-24 DIAGNOSIS — R69 Illness, unspecified: Secondary | ICD-10-CM | POA: Diagnosis not present

## 2021-04-24 DIAGNOSIS — R7881 Bacteremia: Secondary | ICD-10-CM | POA: Diagnosis not present

## 2021-04-24 DIAGNOSIS — Z95810 Presence of automatic (implantable) cardiac defibrillator: Secondary | ICD-10-CM | POA: Diagnosis not present

## 2021-04-24 DIAGNOSIS — E1142 Type 2 diabetes mellitus with diabetic polyneuropathy: Secondary | ICD-10-CM | POA: Diagnosis not present

## 2021-04-24 DIAGNOSIS — A4189 Other specified sepsis: Secondary | ICD-10-CM | POA: Diagnosis not present

## 2021-04-24 DIAGNOSIS — L089 Local infection of the skin and subcutaneous tissue, unspecified: Secondary | ICD-10-CM | POA: Diagnosis not present

## 2021-04-25 DIAGNOSIS — A419 Sepsis, unspecified organism: Secondary | ICD-10-CM | POA: Diagnosis not present

## 2021-04-25 DIAGNOSIS — I96 Gangrene, not elsewhere classified: Secondary | ICD-10-CM | POA: Diagnosis not present

## 2021-04-25 DIAGNOSIS — E11628 Type 2 diabetes mellitus with other skin complications: Secondary | ICD-10-CM | POA: Diagnosis not present

## 2021-04-25 DIAGNOSIS — E11621 Type 2 diabetes mellitus with foot ulcer: Secondary | ICD-10-CM | POA: Diagnosis not present

## 2021-04-26 DIAGNOSIS — L97509 Non-pressure chronic ulcer of other part of unspecified foot with unspecified severity: Secondary | ICD-10-CM | POA: Diagnosis not present

## 2021-04-26 DIAGNOSIS — E11621 Type 2 diabetes mellitus with foot ulcer: Secondary | ICD-10-CM | POA: Diagnosis not present

## 2021-04-26 DIAGNOSIS — E11628 Type 2 diabetes mellitus with other skin complications: Secondary | ICD-10-CM | POA: Diagnosis not present

## 2021-04-26 DIAGNOSIS — A419 Sepsis, unspecified organism: Secondary | ICD-10-CM | POA: Diagnosis not present

## 2021-04-26 DIAGNOSIS — I96 Gangrene, not elsewhere classified: Secondary | ICD-10-CM | POA: Diagnosis not present

## 2021-05-02 DIAGNOSIS — E11628 Type 2 diabetes mellitus with other skin complications: Secondary | ICD-10-CM | POA: Diagnosis not present

## 2021-05-02 DIAGNOSIS — A419 Sepsis, unspecified organism: Secondary | ICD-10-CM | POA: Diagnosis not present

## 2021-05-02 DIAGNOSIS — I96 Gangrene, not elsewhere classified: Secondary | ICD-10-CM | POA: Diagnosis not present

## 2021-05-02 DIAGNOSIS — E11621 Type 2 diabetes mellitus with foot ulcer: Secondary | ICD-10-CM | POA: Diagnosis not present

## 2021-05-07 DIAGNOSIS — I96 Gangrene, not elsewhere classified: Secondary | ICD-10-CM | POA: Diagnosis not present

## 2021-05-07 DIAGNOSIS — E11621 Type 2 diabetes mellitus with foot ulcer: Secondary | ICD-10-CM | POA: Diagnosis not present

## 2021-05-07 DIAGNOSIS — A419 Sepsis, unspecified organism: Secondary | ICD-10-CM | POA: Diagnosis not present

## 2021-05-07 DIAGNOSIS — E11628 Type 2 diabetes mellitus with other skin complications: Secondary | ICD-10-CM | POA: Diagnosis not present

## 2021-05-09 DIAGNOSIS — E11621 Type 2 diabetes mellitus with foot ulcer: Secondary | ICD-10-CM | POA: Diagnosis not present

## 2021-05-09 DIAGNOSIS — E11628 Type 2 diabetes mellitus with other skin complications: Secondary | ICD-10-CM | POA: Diagnosis not present

## 2021-05-09 DIAGNOSIS — I96 Gangrene, not elsewhere classified: Secondary | ICD-10-CM | POA: Diagnosis not present

## 2021-05-09 DIAGNOSIS — A419 Sepsis, unspecified organism: Secondary | ICD-10-CM | POA: Diagnosis not present

## 2021-05-10 DIAGNOSIS — A419 Sepsis, unspecified organism: Secondary | ICD-10-CM | POA: Diagnosis not present

## 2021-05-10 DIAGNOSIS — E11628 Type 2 diabetes mellitus with other skin complications: Secondary | ICD-10-CM | POA: Diagnosis not present

## 2021-05-10 DIAGNOSIS — I96 Gangrene, not elsewhere classified: Secondary | ICD-10-CM | POA: Diagnosis not present

## 2021-05-10 DIAGNOSIS — E11621 Type 2 diabetes mellitus with foot ulcer: Secondary | ICD-10-CM | POA: Diagnosis not present

## 2021-05-12 DIAGNOSIS — L97509 Non-pressure chronic ulcer of other part of unspecified foot with unspecified severity: Secondary | ICD-10-CM | POA: Diagnosis not present

## 2021-05-12 DIAGNOSIS — E11621 Type 2 diabetes mellitus with foot ulcer: Secondary | ICD-10-CM | POA: Diagnosis not present

## 2021-05-14 DIAGNOSIS — E11621 Type 2 diabetes mellitus with foot ulcer: Secondary | ICD-10-CM | POA: Diagnosis not present

## 2021-05-14 DIAGNOSIS — I96 Gangrene, not elsewhere classified: Secondary | ICD-10-CM | POA: Diagnosis not present

## 2021-05-14 DIAGNOSIS — A419 Sepsis, unspecified organism: Secondary | ICD-10-CM | POA: Diagnosis not present

## 2021-05-14 DIAGNOSIS — L97509 Non-pressure chronic ulcer of other part of unspecified foot with unspecified severity: Secondary | ICD-10-CM | POA: Diagnosis not present

## 2021-05-14 DIAGNOSIS — E11628 Type 2 diabetes mellitus with other skin complications: Secondary | ICD-10-CM | POA: Diagnosis not present

## 2021-05-16 DIAGNOSIS — E11621 Type 2 diabetes mellitus with foot ulcer: Secondary | ICD-10-CM | POA: Diagnosis not present

## 2021-05-16 DIAGNOSIS — A419 Sepsis, unspecified organism: Secondary | ICD-10-CM | POA: Diagnosis not present

## 2021-05-16 DIAGNOSIS — E11628 Type 2 diabetes mellitus with other skin complications: Secondary | ICD-10-CM | POA: Diagnosis not present

## 2021-05-16 DIAGNOSIS — I96 Gangrene, not elsewhere classified: Secondary | ICD-10-CM | POA: Diagnosis not present

## 2021-06-02 ENCOUNTER — Ambulatory Visit (INDEPENDENT_AMBULATORY_CARE_PROVIDER_SITE_OTHER): Payer: No Typology Code available for payment source

## 2021-06-02 DIAGNOSIS — Z23 Encounter for immunization: Secondary | ICD-10-CM | POA: Diagnosis not present

## 2021-06-02 DIAGNOSIS — I255 Ischemic cardiomyopathy: Secondary | ICD-10-CM | POA: Diagnosis not present

## 2021-06-02 LAB — CUP PACEART REMOTE DEVICE CHECK
Battery Remaining Longevity: 138 mo
Battery Remaining Percentage: 100 %
Brady Statistic RA Percent Paced: 12 %
Brady Statistic RV Percent Paced: 0 %
Date Time Interrogation Session: 20230124041100
HighPow Impedance: 76 Ohm
Implantable Lead Implant Date: 20200417
Implantable Lead Implant Date: 20200417
Implantable Lead Location: 753859
Implantable Lead Location: 753860
Implantable Lead Model: 292
Implantable Lead Model: 7740
Implantable Lead Serial Number: 1018284
Implantable Lead Serial Number: 445149
Implantable Pulse Generator Implant Date: 20200417
Lead Channel Impedance Value: 504 Ohm
Lead Channel Impedance Value: 598 Ohm
Lead Channel Setting Pacing Amplitude: 3.5 V
Lead Channel Setting Pacing Amplitude: 3.5 V
Lead Channel Setting Pacing Pulse Width: 0.4 ms
Lead Channel Setting Sensing Sensitivity: 0.5 mV
Pulse Gen Serial Number: 552225

## 2021-06-12 NOTE — Progress Notes (Signed)
Remote ICD transmission.   

## 2021-08-11 DIAGNOSIS — E1141 Type 2 diabetes mellitus with diabetic mononeuropathy: Secondary | ICD-10-CM | POA: Diagnosis not present

## 2021-09-01 ENCOUNTER — Ambulatory Visit (INDEPENDENT_AMBULATORY_CARE_PROVIDER_SITE_OTHER): Payer: No Typology Code available for payment source

## 2021-09-01 DIAGNOSIS — I255 Ischemic cardiomyopathy: Secondary | ICD-10-CM | POA: Diagnosis not present

## 2021-09-01 DIAGNOSIS — I472 Ventricular tachycardia, unspecified: Secondary | ICD-10-CM

## 2021-09-01 LAB — CUP PACEART REMOTE DEVICE CHECK
Battery Remaining Longevity: 144 mo
Battery Remaining Percentage: 100 %
Brady Statistic RA Percent Paced: 15 %
Brady Statistic RV Percent Paced: 0 %
Date Time Interrogation Session: 20230425041100
HighPow Impedance: 78 Ohm
Implantable Lead Implant Date: 20200417
Implantable Lead Implant Date: 20200417
Implantable Lead Location: 753859
Implantable Lead Location: 753860
Implantable Lead Model: 292
Implantable Lead Model: 7740
Implantable Lead Serial Number: 1018284
Implantable Lead Serial Number: 445149
Implantable Pulse Generator Implant Date: 20200417
Lead Channel Impedance Value: 504 Ohm
Lead Channel Impedance Value: 620 Ohm
Lead Channel Setting Pacing Amplitude: 3.5 V
Lead Channel Setting Pacing Amplitude: 3.5 V
Lead Channel Setting Pacing Pulse Width: 0.4 ms
Lead Channel Setting Sensing Sensitivity: 0.5 mV
Pulse Gen Serial Number: 552225

## 2021-09-17 NOTE — Progress Notes (Signed)
Remote ICD transmission.   

## 2021-09-21 ENCOUNTER — Telehealth: Payer: Self-pay | Admitting: Internal Medicine

## 2021-09-21 NOTE — Telephone Encounter (Signed)
Message has been sent to [place],EP scheduler for appt ?

## 2021-09-21 NOTE — Telephone Encounter (Signed)
I s/w Audelia Hives, RN with Dr. Nunzio Cobbs office. I did inform them that the pt is going to need to be seen in the office as she has not been seen since 08/2019. Surgery for today will need to be postponed until the pt has been seen by cardiology. Informed RN, [place], EP Scheduler will reach out the pt with an appt. Marya Amsler thanked me for the call and stated he will relay the message to the pt who is there now with them.  ? ?I assured Marya Amsler, RN that I will fax over notes as FYI to fax # given to me today [phone].  ?

## 2021-09-21 NOTE — Telephone Encounter (Addendum)
Per Abram Sander for EP, she has left a detailed message for the pt with appt info. Left message to call back and confirm appt. Will update the requesting office.  ? ?Appt 09/28/21 with Oda Kilts, PAC.  ?

## 2021-09-21 NOTE — Telephone Encounter (Signed)
? ?  Pre-operative Risk Assessment  ?  ?Patient Name: Brittney Sawyer  ?DOB: 1965-08-17 ?MRN: [id number]  ? ?  ? ?Request for Surgical Clearance   ? ?Procedure:  R Distal 2nd Toe Amputation and  R 4th and 5th Hammer Toe repair  ? ?Date of Surgery:  Clearance 09/21/21                              ?   ?Surgeon:  Dr. Lajuana Matte ?Surgeon's Group or Practice Name:  [facility] ?Phone number:  [phone] ?Fax number:   ?  ?Type of Clearance Requested:   ?- Medical  ?  ?Type of Anesthesia:  monitored  ?  ?Additional requests/questions:   ? ?Signed, ?Jimmey Ralph   ?09/21/2021, 10:55 AM   ?

## 2021-09-21 NOTE — Telephone Encounter (Signed)
? ?  Name: Brittney Sawyer  ?DOB: October 21, 1965  ?MRN: [id number] ? ?Primary Cardiologist: Cristopher Peru, MD ? ?Chart reviewed as part of pre-operative protocol coverage. Because of [name]'s past medical history and time since last visit, she will require a follow-up in-office visit in order to better assess preoperative cardiovascular risk. ? ?Pre-op covering staff: ?- Please schedule appointment and call patient to inform them. If patient already had an upcoming appointment within acceptable timeframe, please add "pre-op clearance" to the appointment notes so provider is aware. ?- Please contact requesting surgeon's office via preferred method (i.e, phone, fax) to inform them of need for appointment prior to surgery. ? ? ?Lenna Sciara, NP  ?09/21/2021, 11:17 AM  ? ?

## 2021-09-28 ENCOUNTER — Encounter: Payer: No Typology Code available for payment source | Admitting: Student

## 2021-09-28 NOTE — Progress Notes (Deleted)
Electrophysiology Office Note Date: 09/28/2021  ID:  Fatma, Rutten 1965-12-01, MRN [id number]  PCP: [name], FNP Primary Cardiologist: Lewayne Bunting, MD Electrophysiologist: None ***  CC: Routine ICD follow-up  AZURA TUFARO is a 56 y.o. female seen today for None for {Blank single:19197::"cardiac clearance","post hospital follow up","acute visit due to ***","routine electrophysiology followup"}.  Since {Blank single:19197::"last being seen in our clinic","discharge from hospital"} the patient reports doing ***.  she denies chest pain, palpitations, dyspnea, PND, orthopnea, nausea, vomiting, dizziness, syncope, edema, weight gain, or early satiety. {He/she (caps):30048} has not had ICD shocks.   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 for Chronic systolic CHF/VT History of appropriate therapy: No History of AAD therapy: No  Past Medical History:  Diagnosis Date   Anxiety    Arthritis    "all my joints" (06/20/2017)   Depression    Diabetes mellitus type 2, controlled (HCC) 05/20/2014   GERD (gastroesophageal reflux disease)    "gone w/diet revision" (06/20/2017)   Hyperlipidemia    Hypertension    Infection    right knee replacement   Neuropathy    post back surgery   Pneumonia X 1   Poorly controlled diabetes mellitus (HCC) 07/26/2017   Right knee DJD 01/27/2013   Spinal headache    Substance abuse (HCC)    Alcohol - sober since 1996   Past Surgical History:  Procedure Laterality Date   AMPUTATION Right 10/03/2015   Procedure: Right Foot 3rd Ray Amputation;  Surgeon: Nadara Mustard, MD;  Location: [facility] OR;  Service: Orthopedics;  Laterality: Right;   BACK SURGERY     BUNIONECTOMY WITH HAMMERTOE RECONSTRUCTION Bilateral    EXCISIONAL TOTAL KNEE ARTHROPLASTY WITH ANTIBIOTIC SPACERS Right 06/20/2017   EXCISIONAL TOTAL KNEE ARTHROPLASTY WITH ANTIBIOTIC SPACERS Right 06/20/2017   Procedure: EXCISION RIGHT TOTAL KNEE ARTHROPLASTY WITH  ANTIBIOTIC SPACERS;  Surgeon: Dannielle Huh, MD;  Location: [facility] OR;  Service: Orthopedics;  Laterality: Right;   ICD IMPLANT N/A 08/25/2018   Procedure: ICD IMPLANT;  Surgeon: Marinus Maw, MD;  Location: [facility] INVASIVE CV LAB;  Service: Cardiovascular;  Laterality: N/A;   JOINT REPLACEMENT     LEFT HEART CATH AND CORONARY ANGIOGRAPHY N/A 08/25/2018   Procedure: LEFT HEART CATH AND CORONARY ANGIOGRAPHY;  Surgeon: Dolores Patty, MD;  Location: [facility] INVASIVE CV LAB;  Service: Cardiovascular;  Laterality: N/A;   POSTERIOR LUMBAR FUSION  11/2007   L4, L5    POSTERIOR LUMBAR FUSION  01/2008     second surgery L2-L5 and S1   SHOULDER ARTHROSCOPY W/ ROTATOR CUFF REPAIR Left 01/2017   TOTAL KNEE ARTHROPLASTY Right 01/26/2013   Procedure: TOTAL KNEE ARTHROPLASTY;  Surgeon: Thera Flake., MD;  Location: [facility] OR;  Service: Orthopedics;  Laterality: Right;   TOTAL KNEE REVISION Right 08/22/2017   Procedure: RIGHT TOTAL KNEE REVISION WUITH REMOVAL OF ANTIBIOTIC SPACER;  Surgeon: Dannielle Huh, MD;  Location: [facility] OR;  Service: Orthopedics;  Laterality: Right;   TUBAL LIGATION     VAGINAL HYSTERECTOMY      Current Outpatient Medications  Medication Sig Dispense Refill   acetaminophen (TYLENOL) 500 MG tablet Take 2 tablets (1,000 mg total) by mouth every 8 (eight) hours as needed. (Patient taking differently: Take 500-1,000 mg by mouth every 8 (eight) hours as needed (for pain or headaches). ) 30 tablet 0   aspirin EC 81 MG tablet Take 81 mg by mouth daily.     atorvastatin (LIPITOR) 20  MG tablet Take 20 mg by mouth daily.     B Complex-C (B-COMPLEX WITH VITAMIN C) tablet Take 1 tablet by mouth daily.     FLUoxetine (PROZAC) 40 MG capsule Take by mouth.     gabapentin (NEURONTIN) 800 MG tablet TAKE ONE TABLET BY MOUTH FOUR TIMES A DAY     glucose blood test strip Use to check blood sugar 3 time(s) daily     metoprolol succinate (TOPROL XL) 25 MG 24 hr tablet Take 0.5 tablets (12.5 mg total) by mouth daily.  90 tablet 3   Multiple Vitamin (MULTIVITAMIN) tablet Take 1 tablet by mouth daily.     OZEMPIC, 1 MG/DOSE, 2 MG/1.5ML SOPN Inject 1 mg into the skin every Sunday.      traZODone (DESYREL) 100 MG tablet TAKE ONE TABLET BY MOUTH THREE TIMES A DAY -- NEED OFFICE VISIT (Patient taking differently: Take 200-300 mg by mouth at bedtime. ) 90 tablet 0   Triamcinolone Acetonide (NASACORT ALLERGY 24HR NA) Place into the nose.     varenicline (CHANTIX) 1 MG tablet Take 1 mg by mouth 2 (two) times daily.     No current facility-administered medications for this visit.    Allergies:   Celebrex [celecoxib] and Wellbutrin [bupropion]   Social History: Social History   Socioeconomic History   Marital status: Married    Spouse name: Not on file   Number of children: Not on file   Years of education: Not on file   Highest education level: Not on file  Occupational History   Not on file  Tobacco Use   Smoking status: Former    Packs/day: 0.50    Years: 15.00    Pack years: 7.50    Types: Cigarettes    Quit date: 06/10/2017    Years since quitting: 4.3   Smokeless tobacco: Never   Tobacco comments:    Pt on Chantix  Vaping Use   Vaping Use: Never used  Substance and Sexual Activity   Alcohol use: No    Alcohol/week: 0.0 standard drinks    Comment: Pt is a recovering alcoholic and has been sober since ~2006.   Drug use: Yes    Types: Marijuana, Oxycodone, Hydrocodone    Comment: 2/11//2019 "nothing in the last year"   Sexual activity: Yes    Birth control/protection: Surgical  Other Topics Concern   Not on file  Social History Narrative   Not on file   Social Determinants of Health   Financial Resource Strain: Not on file  Food Insecurity: Not on file  Transportation Needs: Not on file  Physical Activity: Not on file  Stress: Not on file  Social Connections: Not on file  Intimate Partner Violence: Not on file    Family History: Family History  Problem Relation Age of Onset    Thyroid disease Mother    Hypertension Father    Anxiety disorder Sister    Cancer Paternal Grandmother        prostate    Review of Systems: All other systems reviewed and are otherwise negative except as noted above.   Physical Exam: There were no vitals filed for this visit.   GEN- The patient is well appearing, alert and oriented x 3 today.   HEENT: normocephalic, atraumatic; sclera clear, conjunctiva pink; hearing intact; oropharynx clear; neck supple, no JVP Lymph- no cervical lymphadenopathy Lungs- Clear to ausculation bilaterally, normal work of breathing.  No wheezes, rales, rhonchi Heart- Regular rate and rhythm, no murmurs,  rubs or gallops, PMI not laterally displaced GI- soft, non-tender, non-distended, bowel sounds present, no hepatosplenomegaly Extremities- no clubbing or cyanosis. No edema; DP/PT/radial pulses 2+ bilaterally MS- no significant deformity or atrophy Skin- warm and dry, no rash or lesion; ICD pocket well healed Psych- euthymic mood, full affect Neuro- strength and sensation are intact  ICD interrogation- reviewed in detail today,  See PACEART report  EKG:  EKG is ordered today. Personal review of EKG ordered today shows ***  Recent Labs: No results found for requested labs within last 8760 hours.   Wt Readings from Last 3 Encounters:  08/10/19 179 lb 6.4 oz (81.4 kg)  07/10/19 175 lb (79.4 kg)  06/11/19 173 lb (78.5 kg)     Other studies Reviewed: Additional studies/ records that were reviewed today include: Previous EP office notes.   Assessment and Plan:  1.  Chronic systolic dysfunction s/p [employer] dual chamber ICD  euvolemic today Stable on an appropriate medical regimen Normal ICD function See Pace Art report No changes today  2. H/o VT None on device  3. CAD Denies s/s ischemia  4. HTN Stable on current regimen   5. Cardiac Clearance for R Distal 2nd Toe Amputation and  R 4th and 5th Hammer Toe repair    Current medicines are reviewed at length with the patient today.   =  Labs/ tests ordered today include: *** No orders of the defined types were placed in this encounter.    Disposition:   Follow up with {Blank single:19197::"Dr. Allred","Dr. Amada Jupiter. Klein","Dr. Camnitz","Dr. Lambert","EP APP"} in {Blank single:19197::"2 weeks","4 weeks","3 months","6 months","12 months","as usual post gen change"}    Signed, Luane School  09/28/2021 8:04 AM  [facility] [address] [place] [address] [phone] (office) [phone] (fax)

## 2021-10-01 ENCOUNTER — Encounter: Payer: Self-pay | Admitting: Student

## 2021-10-01 ENCOUNTER — Ambulatory Visit (INDEPENDENT_AMBULATORY_CARE_PROVIDER_SITE_OTHER): Payer: No Typology Code available for payment source | Admitting: Student

## 2021-10-01 VITALS — BP 110/60 | HR 50 | Ht 60.0 in | Wt 179.0 lb

## 2021-10-01 DIAGNOSIS — I255 Ischemic cardiomyopathy: Secondary | ICD-10-CM

## 2021-10-01 DIAGNOSIS — I472 Ventricular tachycardia, unspecified: Secondary | ICD-10-CM

## 2021-10-01 LAB — CUP PACEART INCLINIC DEVICE CHECK
Date Time Interrogation Session: 20230525122107
HighPow Impedance: 71 Ohm
Implantable Lead Implant Date: 20200417
Implantable Lead Implant Date: 20200417
Implantable Lead Location: 753859
Implantable Lead Location: 753860
Implantable Lead Model: 292
Implantable Lead Model: 7740
Implantable Lead Serial Number: 1018284
Implantable Lead Serial Number: 445149
Implantable Pulse Generator Implant Date: 20200417
Lead Channel Impedance Value: 503 Ohm
Lead Channel Impedance Value: 605 Ohm
Lead Channel Pacing Threshold Amplitude: 0.9 V
Lead Channel Pacing Threshold Amplitude: 1.4 V
Lead Channel Pacing Threshold Pulse Width: 0.4 ms
Lead Channel Pacing Threshold Pulse Width: 0.4 ms
Lead Channel Sensing Intrinsic Amplitude: 12.5 mV
Lead Channel Sensing Intrinsic Amplitude: 3.9 mV
Lead Channel Setting Pacing Amplitude: 3.5 V
Lead Channel Setting Pacing Amplitude: 3.5 V
Lead Channel Setting Pacing Pulse Width: 0.4 ms
Lead Channel Setting Sensing Sensitivity: 0.5 mV
Pulse Gen Serial Number: 552225

## 2021-10-01 NOTE — Patient Instructions (Signed)
Medication Instructions:  Your physician recommends that you continue on your current medications as directed. Please refer to the Current Medication list given to you today.  *If you need a refill on your cardiac medications before your next appointment, please call your pharmacy*   Lab Work: None If you have labs (blood work) drawn today and your tests are completely normal, you will receive your results only by: MyChart Message (if you have MyChart) OR A paper copy in the mail If you have any lab test that is abnormal or we need to change your treatment, we will call you to review the results.   Follow-Up: At [facility], you and your health needs are our priority.  As part of our continuing mission to provide you with exceptional heart care, we have created designated Provider Care Teams.  These Care Teams include your primary Cardiologist (physician) and Advanced Practice Providers (APPs -  Physician Assistants and Nurse Practitioners) who all work together to provide you with the care you need, when you need it.  Your next appointment:   1 year(s)  The format for your next appointment:   In Person  Provider:   Gregg Taylor, MD   

## 2021-10-01 NOTE — Progress Notes (Signed)
Electrophysiology Office Note Date: 10/01/2021  ID:  Anavey, Brull 1965-07-05, MRN [id number]  PCP: [name], FNP Primary Cardiologist: Lewayne Bunting, MD Electrophysiologist: Lewayne Bunting, MD   CC: Routine ICD follow-up  Brittney Sawyer is a 56 y.o. female seen today for Lewayne Bunting, MD for cardiac clearance.  Since last being seen in our clinic the patient reports doing very well.  she denies chest pain, palpitations, dyspnea, PND, orthopnea, nausea, vomiting, dizziness, syncope, edema, weight gain, or early satiety. He has not had ICD shocks.   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 for Chronic systolic CHF/VT History of appropriate therapy: No History of AAD therapy: No  Past Medical History:  Diagnosis Date   Anxiety    Arthritis    "all my joints" (06/20/2017)   Depression    Diabetes mellitus type 2, controlled (HCC) 05/20/2014   GERD (gastroesophageal reflux disease)    "gone w/diet revision" (06/20/2017)   Hyperlipidemia    Hypertension    Infection    right knee replacement   Neuropathy    post back surgery   Pneumonia X 1   Poorly controlled diabetes mellitus (HCC) 07/26/2017   Right knee DJD 01/27/2013   Spinal headache    Substance abuse (HCC)    Alcohol - sober since 1996   Past Surgical History:  Procedure Laterality Date   AMPUTATION Right 10/03/2015   Procedure: Right Foot 3rd Ray Amputation;  Surgeon: Nadara Mustard, MD;  Location: [facility] OR;  Service: Orthopedics;  Laterality: Right;   BACK SURGERY     BUNIONECTOMY WITH HAMMERTOE RECONSTRUCTION Bilateral    EXCISIONAL TOTAL KNEE ARTHROPLASTY WITH ANTIBIOTIC SPACERS Right 06/20/2017   EXCISIONAL TOTAL KNEE ARTHROPLASTY WITH ANTIBIOTIC SPACERS Right 06/20/2017   Procedure: EXCISION RIGHT TOTAL KNEE ARTHROPLASTY WITH ANTIBIOTIC SPACERS;  Surgeon: Dannielle Huh, MD;  Location: [facility] OR;  Service: Orthopedics;  Laterality: Right;   ICD IMPLANT N/A 08/25/2018   Procedure: ICD  IMPLANT;  Surgeon: Marinus Maw, MD;  Location: [facility] INVASIVE CV LAB;  Service: Cardiovascular;  Laterality: N/A;   JOINT REPLACEMENT     LEFT HEART CATH AND CORONARY ANGIOGRAPHY N/A 08/25/2018   Procedure: LEFT HEART CATH AND CORONARY ANGIOGRAPHY;  Surgeon: Dolores Patty, MD;  Location: [facility] INVASIVE CV LAB;  Service: Cardiovascular;  Laterality: N/A;   POSTERIOR LUMBAR FUSION  11/2007   L4, L5    POSTERIOR LUMBAR FUSION  01/2008     second surgery L2-L5 and S1   SHOULDER ARTHROSCOPY W/ ROTATOR CUFF REPAIR Left 01/2017   TOTAL KNEE ARTHROPLASTY Right 01/26/2013   Procedure: TOTAL KNEE ARTHROPLASTY;  Surgeon: Thera Flake., MD;  Location: [facility] OR;  Service: Orthopedics;  Laterality: Right;   TOTAL KNEE REVISION Right 08/22/2017   Procedure: RIGHT TOTAL KNEE REVISION WUITH REMOVAL OF ANTIBIOTIC SPACER;  Surgeon: Dannielle Huh, MD;  Location: [facility] OR;  Service: Orthopedics;  Laterality: Right;   TUBAL LIGATION     VAGINAL HYSTERECTOMY      Current Outpatient Medications  Medication Sig Dispense Refill   acetaminophen (TYLENOL) 500 MG tablet Take 2 tablets (1,000 mg total) by mouth every 8 (eight) hours as needed. (Patient taking differently: Take 500-1,000 mg by mouth every 8 (eight) hours as needed (for pain or headaches).) 30 tablet 0   acyclovir (ZOVIRAX) 200 MG capsule as needed. breakouts     ARIPiprazole (ABILIFY) 2 MG tablet Take 2 mg by mouth daily.     aspirin  EC 81 MG tablet Take 81 mg by mouth daily.     atorvastatin (LIPITOR) 20 MG tablet Take 20 mg by mouth daily.     B Complex-C (B-COMPLEX WITH VITAMIN C) tablet Take 1 tablet by mouth daily.     FLUoxetine (PROZAC) 40 MG capsule Take by mouth.     gabapentin (NEURONTIN) 800 MG tablet TAKE ONE TABLET BY MOUTH FOUR TIMES A DAY     glucose blood test strip Use to check blood sugar 3 time(s) daily     metoprolol succinate (TOPROL XL) 25 MG 24 hr tablet Take 0.5 tablets (12.5 mg total) by mouth daily. 90 tablet 3   Multiple Vitamin  (MULTIVITAMIN) tablet Take 1 tablet by mouth daily.     nystatin cream (MYCOSTATIN) Apply topically as needed.     OZEMPIC, 1 MG/DOSE, 2 MG/1.5ML SOPN Inject 1 mg into the skin every Sunday.      traZODone (DESYREL) 100 MG tablet TAKE ONE TABLET BY MOUTH THREE TIMES A DAY -- NEED OFFICE VISIT (Patient taking differently: Take 200-300 mg by mouth at bedtime.) 90 tablet 0   Triamcinolone Acetonide (NASACORT ALLERGY 24HR NA) Place into the nose.     No current facility-administered medications for this visit.    Allergies:   Celebrex [celecoxib] and Wellbutrin [bupropion]   Social History: Social History   Socioeconomic History   Marital status: Married    Spouse name: Not on file   Number of children: Not on file   Years of education: Not on file   Highest education level: Not on file  Occupational History   Not on file  Tobacco Use   Smoking status: Former    Packs/day: 0.50    Years: 15.00    Pack years: 7.50    Types: Cigarettes    Quit date: 06/10/2017    Years since quitting: 4.3   Smokeless tobacco: Never   Tobacco comments:    Pt on Chantix  Vaping Use   Vaping Use: Never used  Substance and Sexual Activity   Alcohol use: No    Alcohol/week: 0.0 standard drinks    Comment: Pt is a recovering alcoholic and has been sober since ~2006.   Drug use: Yes    Types: Marijuana, Oxycodone, Hydrocodone    Comment: 2/11//2019 "nothing in the last year"   Sexual activity: Yes    Birth control/protection: Surgical  Other Topics Concern   Not on file  Social History Narrative   Not on file   Social Determinants of Health   Financial Resource Strain: Not on file  Food Insecurity: Not on file  Transportation Needs: Not on file  Physical Activity: Not on file  Stress: Not on file  Social Connections: Not on file  Intimate Partner Violence: Not on file    Family History: Family History  Problem Relation Age of Onset   Thyroid disease Mother    Hypertension Father     Anxiety disorder Sister    Cancer Paternal Grandmother        prostate    Review of Systems: All other systems reviewed and are otherwise negative except as noted above.   Physical Exam: Vitals:   10/01/21 1138  BP: 110/60  Pulse: (!) 50  SpO2: 97%  Weight: 179 lb (81.2 kg)  Height: 5' (1.524 m)     GEN- The patient is well appearing, alert and oriented x 3 today.   HEENT: normocephalic, atraumatic; sclera clear, conjunctiva pink; hearing intact; oropharynx clear;  neck supple, no JVP Lymph- no cervical lymphadenopathy Lungs- Clear to ausculation bilaterally, normal work of breathing.  No wheezes, rales, rhonchi Heart- Regular rate and rhythm, no murmurs, rubs or gallops, PMI not laterally displaced GI- soft, non-tender, non-distended, bowel sounds present, no hepatosplenomegaly Extremities- no clubbing or cyanosis. No edema; DP/PT/radial pulses 2+ bilaterally MS- no significant deformity or atrophy Skin- warm and dry, no rash or lesion; ICD pocket well healed Psych- euthymic mood, full affect Neuro- strength and sensation are intact  ICD interrogation- reviewed in detail today,  See PACEART report  EKG:  EKG is ordered today. Personal review of EKG ordered today shows A paced rhythm at 50 bpm  Recent Labs: No results found for requested labs within last 8760 hours.   Wt Readings from Last 3 Encounters:  10/01/21 179 lb (81.2 kg)  08/10/19 179 lb 6.4 oz (81.4 kg)  07/10/19 175 lb (79.4 kg)     Other studies Reviewed: Additional studies/ records that were reviewed today include: Previous EP office notes.   Assessment and Plan:  1.  Chronic systolic dysfunction s/p [employer] dual chamber ICD  euvolemic today Stable on an appropriate medical regimen Normal ICD function See Pace Art report No changes today  2. CAD Non obstructive by cath No s/s ischemia  3. H/o VT No further by device  4. Cardiac clearance for R Distal 2nd Toe Amputation and  R 4th  and 5th Hammer Toe repair  EF 40-45% 2020 Non obstructive CAD by cath No symptoms concerning for additional work up at this time.  She may proceed with this procedure without further cardiac work up with a relative low risk from a cardiac perspective.   Current medicines are reviewed at length with the patient today.    Disposition:   Follow up with Dr. Ladona Ridgel in 12 months, sooner with issues.    Dustin Flock, PA-C  10/01/2021 11:44 AM  [facility] [address] [place] [address] [phone] (office) [phone] (fax)

## 2021-10-12 DIAGNOSIS — R69 Illness, unspecified: Secondary | ICD-10-CM | POA: Diagnosis not present

## 2021-10-12 DIAGNOSIS — M2041 Other hammer toe(s) (acquired), right foot: Secondary | ICD-10-CM | POA: Diagnosis not present

## 2021-10-12 DIAGNOSIS — I10 Essential (primary) hypertension: Secondary | ICD-10-CM | POA: Diagnosis not present

## 2021-10-12 DIAGNOSIS — E785 Hyperlipidemia, unspecified: Secondary | ICD-10-CM | POA: Diagnosis not present

## 2021-10-12 DIAGNOSIS — Z7985 Long-term (current) use of injectable non-insulin antidiabetic drugs: Secondary | ICD-10-CM | POA: Diagnosis not present

## 2021-10-12 DIAGNOSIS — Z7982 Long term (current) use of aspirin: Secondary | ICD-10-CM | POA: Diagnosis not present

## 2021-10-12 DIAGNOSIS — Z87891 Personal history of nicotine dependence: Secondary | ICD-10-CM | POA: Diagnosis not present

## 2021-10-12 DIAGNOSIS — L97512 Non-pressure chronic ulcer of other part of right foot with fat layer exposed: Secondary | ICD-10-CM | POA: Diagnosis not present

## 2021-10-12 DIAGNOSIS — M205X1 Other deformities of toe(s) (acquired), right foot: Secondary | ICD-10-CM | POA: Diagnosis not present

## 2021-10-12 DIAGNOSIS — Z79899 Other long term (current) drug therapy: Secondary | ICD-10-CM | POA: Diagnosis not present

## 2021-10-12 DIAGNOSIS — E1142 Type 2 diabetes mellitus with diabetic polyneuropathy: Secondary | ICD-10-CM | POA: Diagnosis not present

## 2021-10-12 DIAGNOSIS — E11621 Type 2 diabetes mellitus with foot ulcer: Secondary | ICD-10-CM | POA: Diagnosis not present

## 2021-12-01 ENCOUNTER — Ambulatory Visit (INDEPENDENT_AMBULATORY_CARE_PROVIDER_SITE_OTHER): Payer: 59

## 2021-12-01 DIAGNOSIS — I255 Ischemic cardiomyopathy: Secondary | ICD-10-CM

## 2021-12-02 LAB — CUP PACEART REMOTE DEVICE CHECK
Battery Remaining Longevity: 138 mo
Battery Remaining Percentage: 100 %
Brady Statistic RA Percent Paced: 22 %
Brady Statistic RV Percent Paced: 0 %
Date Time Interrogation Session: 20230726105400
HighPow Impedance: 82 Ohm
Implantable Lead Implant Date: 20200417
Implantable Lead Implant Date: 20200417
Implantable Lead Location: 753859
Implantable Lead Location: 753860
Implantable Lead Model: 292
Implantable Lead Model: 7740
Implantable Lead Serial Number: 1018284
Implantable Lead Serial Number: 445149
Implantable Pulse Generator Implant Date: 20200417
Lead Channel Impedance Value: 560 Ohm
Lead Channel Impedance Value: 624 Ohm
Lead Channel Setting Pacing Amplitude: 3.5 V
Lead Channel Setting Pacing Amplitude: 3.5 V
Lead Channel Setting Pacing Pulse Width: 0.4 ms
Lead Channel Setting Sensing Sensitivity: 0.5 mV
Pulse Gen Serial Number: 552225

## 2021-12-10 DIAGNOSIS — E669 Obesity, unspecified: Secondary | ICD-10-CM | POA: Diagnosis not present

## 2021-12-10 DIAGNOSIS — L97522 Non-pressure chronic ulcer of other part of left foot with fat layer exposed: Secondary | ICD-10-CM | POA: Diagnosis not present

## 2021-12-10 DIAGNOSIS — Z79899 Other long term (current) drug therapy: Secondary | ICD-10-CM | POA: Diagnosis not present

## 2021-12-10 DIAGNOSIS — M2012 Hallux valgus (acquired), left foot: Secondary | ICD-10-CM | POA: Diagnosis not present

## 2021-12-10 DIAGNOSIS — E1169 Type 2 diabetes mellitus with other specified complication: Secondary | ICD-10-CM | POA: Diagnosis not present

## 2021-12-10 DIAGNOSIS — L97529 Non-pressure chronic ulcer of other part of left foot with unspecified severity: Secondary | ICD-10-CM | POA: Diagnosis not present

## 2021-12-10 DIAGNOSIS — E785 Hyperlipidemia, unspecified: Secondary | ICD-10-CM | POA: Diagnosis not present

## 2021-12-10 DIAGNOSIS — Z7985 Long-term (current) use of injectable non-insulin antidiabetic drugs: Secondary | ICD-10-CM | POA: Diagnosis not present

## 2021-12-10 DIAGNOSIS — E1142 Type 2 diabetes mellitus with diabetic polyneuropathy: Secondary | ICD-10-CM | POA: Diagnosis not present

## 2021-12-10 DIAGNOSIS — Z7982 Long term (current) use of aspirin: Secondary | ICD-10-CM | POA: Diagnosis not present

## 2021-12-10 DIAGNOSIS — E11621 Type 2 diabetes mellitus with foot ulcer: Secondary | ICD-10-CM | POA: Diagnosis not present

## 2021-12-10 DIAGNOSIS — M2042 Other hammer toe(s) (acquired), left foot: Secondary | ICD-10-CM | POA: Diagnosis not present

## 2021-12-10 DIAGNOSIS — Z6835 Body mass index (BMI) 35.0-35.9, adult: Secondary | ICD-10-CM | POA: Diagnosis not present

## 2021-12-10 DIAGNOSIS — R69 Illness, unspecified: Secondary | ICD-10-CM | POA: Diagnosis not present

## 2021-12-15 DIAGNOSIS — M19072 Primary osteoarthritis, left ankle and foot: Secondary | ICD-10-CM | POA: Diagnosis not present

## 2021-12-29 NOTE — Progress Notes (Signed)
Remote ICD transmission.   

## 2022-01-28 DIAGNOSIS — E785 Hyperlipidemia, unspecified: Secondary | ICD-10-CM | POA: Diagnosis not present

## 2022-01-28 DIAGNOSIS — R69 Illness, unspecified: Secondary | ICD-10-CM | POA: Diagnosis not present

## 2022-01-28 DIAGNOSIS — E1169 Type 2 diabetes mellitus with other specified complication: Secondary | ICD-10-CM | POA: Diagnosis not present

## 2022-01-28 DIAGNOSIS — I2510 Atherosclerotic heart disease of native coronary artery without angina pectoris: Secondary | ICD-10-CM | POA: Diagnosis not present

## 2022-01-28 DIAGNOSIS — I10 Essential (primary) hypertension: Secondary | ICD-10-CM | POA: Diagnosis not present

## 2022-01-28 DIAGNOSIS — M10041 Idiopathic gout, right hand: Secondary | ICD-10-CM | POA: Diagnosis not present

## 2022-01-28 DIAGNOSIS — M869 Osteomyelitis, unspecified: Secondary | ICD-10-CM | POA: Diagnosis not present

## 2022-01-28 DIAGNOSIS — E669 Obesity, unspecified: Secondary | ICD-10-CM | POA: Diagnosis not present

## 2022-01-28 DIAGNOSIS — Z7985 Long-term (current) use of injectable non-insulin antidiabetic drugs: Secondary | ICD-10-CM | POA: Diagnosis not present

## 2022-01-28 DIAGNOSIS — M86171 Other acute osteomyelitis, right ankle and foot: Secondary | ICD-10-CM | POA: Diagnosis not present

## 2022-01-28 DIAGNOSIS — L97519 Non-pressure chronic ulcer of other part of right foot with unspecified severity: Secondary | ICD-10-CM | POA: Diagnosis not present

## 2022-01-28 DIAGNOSIS — Z87891 Personal history of nicotine dependence: Secondary | ICD-10-CM | POA: Diagnosis not present

## 2022-02-01 DIAGNOSIS — L988 Other specified disorders of the skin and subcutaneous tissue: Secondary | ICD-10-CM | POA: Diagnosis not present

## 2022-02-01 DIAGNOSIS — L97519 Non-pressure chronic ulcer of other part of right foot with unspecified severity: Secondary | ICD-10-CM | POA: Diagnosis not present

## 2022-02-01 DIAGNOSIS — M86171 Other acute osteomyelitis, right ankle and foot: Secondary | ICD-10-CM | POA: Diagnosis not present

## 2022-03-08 ENCOUNTER — Ambulatory Visit (INDEPENDENT_AMBULATORY_CARE_PROVIDER_SITE_OTHER): Payer: Self-pay

## 2022-03-08 DIAGNOSIS — I255 Ischemic cardiomyopathy: Secondary | ICD-10-CM

## 2022-03-09 LAB — CUP PACEART REMOTE DEVICE CHECK
Battery Remaining Longevity: 138 mo
Battery Remaining Percentage: 100 %
Brady Statistic RA Percent Paced: 20 %
Brady Statistic RV Percent Paced: 0 %
Date Time Interrogation Session: 20231030041200
HighPow Impedance: 74 Ohm
Implantable Lead Connection Status: 753985
Implantable Lead Connection Status: 753985
Implantable Lead Implant Date: 20200417
Implantable Lead Implant Date: 20200417
Implantable Lead Location: 753859
Implantable Lead Location: 753860
Implantable Lead Model: 292
Implantable Lead Model: 7740
Implantable Lead Serial Number: 1018284
Implantable Lead Serial Number: 445149
Implantable Pulse Generator Implant Date: 20200417
Lead Channel Impedance Value: 501 Ohm
Lead Channel Impedance Value: 583 Ohm
Lead Channel Setting Pacing Amplitude: 3.5 V
Lead Channel Setting Pacing Amplitude: 3.5 V
Lead Channel Setting Pacing Pulse Width: 0.4 ms
Lead Channel Setting Sensing Sensitivity: 0.5 mV
Pulse Gen Serial Number: 552225
Zone Setting Status: 755011

## 2022-04-10 NOTE — Progress Notes (Signed)
Remote ICD transmission.   

## 2022-06-07 ENCOUNTER — Ambulatory Visit: Payer: Self-pay

## 2022-06-07 DIAGNOSIS — I472 Ventricular tachycardia, unspecified: Secondary | ICD-10-CM

## 2022-06-07 DIAGNOSIS — I255 Ischemic cardiomyopathy: Secondary | ICD-10-CM

## 2022-06-08 LAB — CUP PACEART REMOTE DEVICE CHECK
Battery Remaining Longevity: 138 mo
Battery Remaining Percentage: 100 %
Brady Statistic RA Percent Paced: 17 %
Brady Statistic RV Percent Paced: 0 %
Date Time Interrogation Session: 20240129041000
HighPow Impedance: 73 Ohm
Implantable Lead Connection Status: 753985
Implantable Lead Connection Status: 753985
Implantable Lead Implant Date: 20200417
Implantable Lead Implant Date: 20200417
Implantable Lead Location: 753859
Implantable Lead Location: 753860
Implantable Lead Model: 292
Implantable Lead Model: 7740
Implantable Lead Serial Number: 1018284
Implantable Lead Serial Number: 445149
Implantable Pulse Generator Implant Date: 20200417
Lead Channel Impedance Value: 532 Ohm
Lead Channel Impedance Value: 617 Ohm
Lead Channel Setting Pacing Amplitude: 3.5 V
Lead Channel Setting Pacing Amplitude: 3.5 V
Lead Channel Setting Pacing Pulse Width: 0.4 ms
Lead Channel Setting Sensing Sensitivity: 0.5 mV
Pulse Gen Serial Number: 552225
Zone Setting Status: 755011

## 2022-06-09 DIAGNOSIS — R92323 Mammographic fibroglandular density, bilateral breasts: Secondary | ICD-10-CM | POA: Diagnosis not present

## 2022-06-09 DIAGNOSIS — N644 Mastodynia: Secondary | ICD-10-CM | POA: Diagnosis not present

## 2022-06-09 DIAGNOSIS — R92321 Mammographic fibroglandular density, right breast: Secondary | ICD-10-CM | POA: Diagnosis not present

## 2022-07-22 NOTE — Progress Notes (Signed)
Remote ICD transmission.   

## 2022-09-06 ENCOUNTER — Ambulatory Visit (INDEPENDENT_AMBULATORY_CARE_PROVIDER_SITE_OTHER): Payer: Self-pay

## 2022-09-06 DIAGNOSIS — I255 Ischemic cardiomyopathy: Secondary | ICD-10-CM

## 2022-09-07 LAB — CUP PACEART REMOTE DEVICE CHECK
Battery Remaining Longevity: 126 mo
Battery Remaining Percentage: 100 %
Brady Statistic RA Percent Paced: 18 %
Brady Statistic RV Percent Paced: 0 %
Date Time Interrogation Session: 20240429041100
HighPow Impedance: 83 Ohm
Implantable Lead Connection Status: 753985
Implantable Lead Connection Status: 753985
Implantable Lead Implant Date: 20200417
Implantable Lead Implant Date: 20200417
Implantable Lead Location: 753859
Implantable Lead Location: 753860
Implantable Lead Model: 292
Implantable Lead Model: 7740
Implantable Lead Serial Number: 1018284
Implantable Lead Serial Number: 445149
Implantable Pulse Generator Implant Date: 20200417
Lead Channel Impedance Value: 541 Ohm
Lead Channel Impedance Value: 644 Ohm
Lead Channel Setting Pacing Amplitude: 3.5 V
Lead Channel Setting Pacing Amplitude: 3.5 V
Lead Channel Setting Pacing Pulse Width: 0.4 ms
Lead Channel Setting Sensing Sensitivity: 0.5 mV
Pulse Gen Serial Number: 552225
Zone Setting Status: 755011

## 2022-10-08 NOTE — Progress Notes (Signed)
Remote ICD transmission.   

## 2022-10-22 ENCOUNTER — Encounter: Payer: Self-pay | Admitting: Student

## 2022-11-24 NOTE — Progress Notes (Deleted)
  Electrophysiology Office Note:   ID:  Brittney Sawyer, DOB [birth year], MRN [id number]  Primary Cardiologist: Lewayne Bunting, MD Electrophysiologist: Lewayne Bunting, MD  {Click to update primary MD,subspecialty MD or APP then REFRESH:1}    History of Present Illness:   Brittney Sawyer is a 57 y.o. female with h/o DM2, HLD, HTN, h/o VT, CAD, andchronic systolic CHF seen today for routine electrophysiology followup.   Since last being seen in our clinic the patient reports doing ***.  she denies chest pain, palpitations, dyspnea, PND, orthopnea, nausea, vomiting, dizziness, syncope, edema, weight gain, or early satiety.   Review of systems complete and found to be negative unless listed in HPI.   EP Information / Studies Reviewed:    EKG is ordered today. Personal review as below.       ICD Interrogation-  reviewed in detail today,  See PACEART report.  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  {Refresh Note OR Click here to enter BP  :1}***        Physical Exam:   VS:  There were no vitals taken for this visit.   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

## 2022-11-25 ENCOUNTER — Ambulatory Visit: Payer: Self-pay | Admitting: Student

## 2022-12-01 DIAGNOSIS — S46219A Strain of muscle, fascia and tendon of other parts of biceps, unspecified arm, initial encounter: Secondary | ICD-10-CM | POA: Diagnosis not present

## 2022-12-01 DIAGNOSIS — M25511 Pain in right shoulder: Secondary | ICD-10-CM | POA: Diagnosis not present

## 2022-12-01 DIAGNOSIS — Z23 Encounter for immunization: Secondary | ICD-10-CM | POA: Diagnosis not present

## 2022-12-01 DIAGNOSIS — D692 Other nonthrombocytopenic purpura: Secondary | ICD-10-CM | POA: Diagnosis not present

## 2022-12-01 DIAGNOSIS — E1141 Type 2 diabetes mellitus with diabetic mononeuropathy: Secondary | ICD-10-CM | POA: Diagnosis not present

## 2022-12-01 DIAGNOSIS — G8929 Other chronic pain: Secondary | ICD-10-CM | POA: Diagnosis not present

## 2022-12-01 DIAGNOSIS — Z0000 Encounter for general adult medical examination without abnormal findings: Secondary | ICD-10-CM | POA: Diagnosis not present

## 2022-12-06 ENCOUNTER — Ambulatory Visit (INDEPENDENT_AMBULATORY_CARE_PROVIDER_SITE_OTHER): Payer: 59

## 2022-12-06 DIAGNOSIS — I255 Ischemic cardiomyopathy: Secondary | ICD-10-CM

## 2022-12-14 DIAGNOSIS — M25511 Pain in right shoulder: Secondary | ICD-10-CM | POA: Diagnosis not present

## 2022-12-14 DIAGNOSIS — M75101 Unspecified rotator cuff tear or rupture of right shoulder, not specified as traumatic: Secondary | ICD-10-CM | POA: Diagnosis not present

## 2022-12-14 DIAGNOSIS — G8929 Other chronic pain: Secondary | ICD-10-CM | POA: Diagnosis not present

## 2022-12-21 NOTE — Progress Notes (Unsigned)
Cardiology Office Note Date:  12/22/2022  Patient ID:  Brittney, Sawyer 10/17/1965, MRN [id number] PCP:  [name], FNP  Electrophysiologist: Dr. Ladona Ridgel    Chief Complaint: annual visit  History of Present Illness: Brittney Sawyer is a 57 y.o. female with history of  DM, obesity, smoker, HLD, and insomnia, Depression, VT, ICD, NICM, chronic CHF (systolic)  Saw A. Tillery, PA-C 10/01/21, doing well, pending toe amputations (for hammer toe).  Felt a reasonable candidate No changes were made.  TODAY She is doing very well Exercising regularly, on the treadmill 1 - 1.5 hrs most days Reports good exercise capacity No CP, palpitations or cardiac awareness No near syncope or syncope. For years she reports some orthostatic dizziness, not new, not changing She states her PMD does her labs regularly Last A1c was 6.7  She is likely going to need surgery coming up in the next weeks/months or so, pending MRI, suspect torn rotator cuff and ruptured bicep.   Device information BSCi dual chamber ICD implanted 08/25/2018 Secondary prevention Presented to ER in [place] (Unable to induce VT at time of implant)   Past Medical History:  Diagnosis Date   Anxiety    Arthritis    "all my joints" (06/20/2017)   Depression    Diabetes mellitus type 2, controlled (HCC) 05/20/2014   GERD (gastroesophageal reflux disease)    "gone w/diet revision" (06/20/2017)   Hyperlipidemia    Hypertension    Infection    right knee replacement   Neuropathy    post back surgery   Pneumonia X 1   Poorly controlled diabetes mellitus (HCC) 07/26/2017   Right knee DJD 01/27/2013   Spinal headache    Substance abuse (HCC)    Alcohol - sober since 1996    Past Surgical History:  Procedure Laterality Date   AMPUTATION Right 10/03/2015   Procedure: Right Foot 3rd Ray Amputation;  Surgeon: Nadara Mustard, MD;  Location: [facility] OR;  Service: Orthopedics;  Laterality: Right;   BACK SURGERY     BUNIONECTOMY WITH  HAMMERTOE RECONSTRUCTION Bilateral    EXCISIONAL TOTAL KNEE ARTHROPLASTY WITH ANTIBIOTIC SPACERS Right 06/20/2017   EXCISIONAL TOTAL KNEE ARTHROPLASTY WITH ANTIBIOTIC SPACERS Right 06/20/2017   Procedure: EXCISION RIGHT TOTAL KNEE ARTHROPLASTY WITH ANTIBIOTIC SPACERS;  Surgeon: Dannielle Huh, MD;  Location: [facility] OR;  Service: Orthopedics;  Laterality: Right;   ICD IMPLANT N/A 08/25/2018   Procedure: ICD IMPLANT;  Surgeon: Marinus Maw, MD;  Location: [facility] INVASIVE CV LAB;  Service: Cardiovascular;  Laterality: N/A;   JOINT REPLACEMENT     LEFT HEART CATH AND CORONARY ANGIOGRAPHY N/A 08/25/2018   Procedure: LEFT HEART CATH AND CORONARY ANGIOGRAPHY;  Surgeon: Dolores Patty, MD;  Location: [facility] INVASIVE CV LAB;  Service: Cardiovascular;  Laterality: N/A;   POSTERIOR LUMBAR FUSION  11/2007   L4, L5    POSTERIOR LUMBAR FUSION  01/2008     second surgery L2-L5 and S1   SHOULDER ARTHROSCOPY W/ ROTATOR CUFF REPAIR Left 01/2017   TOTAL KNEE ARTHROPLASTY Right 01/26/2013   Procedure: TOTAL KNEE ARTHROPLASTY;  Surgeon: Thera Flake., MD;  Location: [facility] OR;  Service: Orthopedics;  Laterality: Right;   TOTAL KNEE REVISION Right 08/22/2017   Procedure: RIGHT TOTAL KNEE REVISION WUITH REMOVAL OF ANTIBIOTIC SPACER;  Surgeon: Dannielle Huh, MD;  Location: [facility] OR;  Service: Orthopedics;  Laterality: Right;   TUBAL LIGATION     VAGINAL HYSTERECTOMY      Current Outpatient  Medications  Medication Sig Dispense Refill   acetaminophen (TYLENOL) 500 MG tablet Take 2 tablets (1,000 mg total) by mouth every 8 (eight) hours as needed. (Patient taking differently: Take 500-1,000 mg by mouth every 8 (eight) hours as needed (for pain or headaches).) 30 tablet 0   acyclovir (ZOVIRAX) 200 MG capsule as needed. breakouts     albuterol (VENTOLIN HFA) 108 (90 Base) MCG/ACT inhaler Inhale into the lungs.     ARIPiprazole (ABILIFY) 2 MG tablet Take 2 mg by mouth daily.     aspirin EC 81 MG tablet Take 81 mg by mouth daily.      atorvastatin (LIPITOR) 20 MG tablet Take 20 mg by mouth daily.     B Complex-C (B-COMPLEX WITH VITAMIN C) tablet Take 1 tablet by mouth daily.     busPIRone (BUSPAR) 15 MG tablet Take 15 mg by mouth as needed.     FLUoxetine (PROZAC) 40 MG capsule Take by mouth.     gabapentin (NEURONTIN) 800 MG tablet TAKE ONE TABLET BY MOUTH FOUR TIMES A DAY     metoprolol succinate (TOPROL XL) 25 MG 24 hr tablet Take 0.5 tablets (12.5 mg total) by mouth daily. 90 tablet 3   Multiple Vitamin (MULTIVITAMIN) tablet Take 1 tablet by mouth daily.     nystatin cream (MYCOSTATIN) Apply topically as needed.     traZODone (DESYREL) 100 MG tablet TAKE ONE TABLET BY MOUTH THREE TIMES A DAY -- NEED OFFICE VISIT (Patient taking differently: Take 200-300 mg by mouth at bedtime.) 90 tablet 0   Triamcinolone Acetonide (NASACORT ALLERGY 24HR NA) Place into the nose.     TRULICITY 0.75 MG/0.5ML SOPN 0.75 mg once a week.     No current facility-administered medications for this visit.    Allergies:   Celebrex [celecoxib] and Wellbutrin [bupropion]   Social History:  The patient  reports that she quit smoking about 5 years ago. Her smoking use included cigarettes. She started smoking about 20 years ago. She has a 7.5 pack-year smoking history. She has never used smokeless tobacco. She reports current drug use. Drugs: Marijuana, Oxycodone, and Hydrocodone. She reports that she does not drink alcohol.   Family History:  The patient's family history includes Anxiety disorder in her sister; Cancer in her paternal grandmother; Hypertension in her father; Thyroid disease in her mother.  ROS:  Please see the history of present illness.    All other systems are reviewed and otherwise negative.   PHYSICAL EXAM:  VS:  BP 102/80   Pulse 69   Ht 5' 0.5" (1.537 m)   Wt 166 lb (75.3 kg)   SpO2 96%   BMI 31.89 kg/m  BMI: Body mass index is 31.89 kg/m. Well nourished, well developed, in no acute distress HEENT: normocephalic,  atraumatic Neck: no JVD, carotid bruits or masses Cardiac:  RRR; no significant murmurs, no rubs, or gallops Lungs:  CTA b/l, no wheezing, rhonchi or rales Abd: soft, nontender MS: no deformity or atrophy Ext: no edema Skin: warm and dry, no rash Neuro:  No gross deficits appreciated Psych: euthymic mood, full affect  ICD site is stable, no tethering or discomfort   EKG:  Done today and reviewed by myself shows  SR 69bpm, 1st degree AVBlock , QRS 142 nsIVCD  Device interrogation done today and reviewed by myself:  Battery and lead measurements are good Lead outputs adjusted from acute implant outputs keeping safety margin No arrhythmias AP 17% VP <1%   Echo 08/25/18 IMPRESSIONS  1. The left ventricle has mild-moderately reduced systolic function, with an ejection fraction of 40-45%. The cavity size was normal. There is concentric left ventricular hypertrophy. Left ventricular diastolic parameters were normal.  2. The right ventricle has normal systolic function. The cavity was mildly enlarged. There is no increase in right ventricular wall thickness.  3. The mitral valve is grossly normal.  4. The tricuspid valve is grossly normal.  5. The aortic valve is tricuspid. Mild thickening of the aortic valve. Mild calcification of the aortic valve. Aortic valve regurgitation is mild by color flow Doppler. No stenosis of the aortic valve.  6. The interatrial septum was not assessed.   SUMMARY No prior echocardiogram available for comparison. Estimated LVEF 40-45%. There is akinesis of the mid inferoseptal, anteroseptal and apical inferior, septal and anterior walls. No thrombus is seen in the let ventricular apex.    LEFT HEART CATH AND CORONARY ANGIOGRAPHY 08/25/18  Conclusions Mid LAD to Dist LAD lesion is 30% stenosed. Assessment & Plan: 1. Very mild plaque in distal LAD 2.  EF 45-50% 3.  LVEDP 24     Recent Labs: No results found for requested labs within last 365  days.  No results found for requested labs within last 365 days.   CrCl cannot be calculated (Patient's most recent lab result is older than the maximum 21 days allowed.).   Wt Readings from Last 3 Encounters:  12/22/22 166 lb (75.3 kg)  10/01/21 179 lb (81.2 kg)  08/10/19 179 lb 6.4 oz (81.4 kg)     Other studies reviewed: Additional studies/records reviewed today include: summarized above  ASSESSMENT AND PLAN:  ICD Intsct function Programmed as above  NICM Appears well compensated No symptoms or exam findings to suggest volume OL BP limits GDMT Tolerating low dose BB Chronic orthostatic dizziness, no syncope > discussed safety and adequate hydration  Will request labs from her PMD   VT None on device Tolerating low dose BB  4. Pending MRI Device is conditional  5. Pending possible/probable shoulder/bicep surgery Based on today's visit would not have an cardiac contraindications to orthopedic surgery Would need usual peri-operative device management    Disposition: F/u with [place] in 77mo, sooner if needed  Current medicines are reviewed at length with the patient today.  The patient did not have any concerns regarding medicines.  Norma Fredrickson, PA-C 12/22/2022 4:51 PM     [facility] [address] [place] [address] [phone] (office)  [phone] (fax)

## 2022-12-22 ENCOUNTER — Ambulatory Visit: Payer: 59 | Attending: Physician Assistant | Admitting: Physician Assistant

## 2022-12-22 ENCOUNTER — Encounter: Payer: Self-pay | Admitting: Physician Assistant

## 2022-12-22 VITALS — BP 102/80 | HR 69 | Ht 60.5 in | Wt 166.0 lb

## 2022-12-22 DIAGNOSIS — I428 Other cardiomyopathies: Secondary | ICD-10-CM

## 2022-12-22 DIAGNOSIS — Z95810 Presence of automatic (implantable) cardiac defibrillator: Secondary | ICD-10-CM | POA: Diagnosis not present

## 2022-12-22 DIAGNOSIS — I10 Essential (primary) hypertension: Secondary | ICD-10-CM

## 2022-12-22 DIAGNOSIS — I472 Ventricular tachycardia, unspecified: Secondary | ICD-10-CM | POA: Diagnosis not present

## 2022-12-22 LAB — CUP PACEART INCLINIC DEVICE CHECK
Date Time Interrogation Session: 20240814192949
HighPow Impedance: 75 Ohm
Implantable Lead Connection Status: 753985
Implantable Lead Connection Status: 753985
Implantable Lead Implant Date: 20200417
Implantable Lead Implant Date: 20200417
Implantable Lead Location: 753859
Implantable Lead Location: 753860
Implantable Lead Model: 292
Implantable Lead Model: 7740
Implantable Lead Serial Number: 1018284
Implantable Lead Serial Number: 445149
Implantable Pulse Generator Implant Date: 20200417
Lead Channel Impedance Value: 532 Ohm
Lead Channel Impedance Value: 590 Ohm
Lead Channel Pacing Threshold Amplitude: 1 V
Lead Channel Pacing Threshold Amplitude: 1.5 V
Lead Channel Pacing Threshold Pulse Width: 0.4 ms
Lead Channel Pacing Threshold Pulse Width: 0.4 ms
Lead Channel Sensing Intrinsic Amplitude: 14.4 mV
Lead Channel Sensing Intrinsic Amplitude: 4.1 mV
Lead Channel Setting Pacing Amplitude: 2 V
Lead Channel Setting Pacing Amplitude: 3 V
Lead Channel Setting Pacing Pulse Width: 0.4 ms
Lead Channel Setting Sensing Sensitivity: 0.5 mV
Pulse Gen Serial Number: 552225
Zone Setting Status: 755011

## 2022-12-22 NOTE — Patient Instructions (Signed)
Medication Instructions:    Your physician recommends that you continue on your current medications as directed. Please refer to the Current Medication list given to you today.  *If you need a refill on your cardiac medications before your next appointment, please call your pharmacy*   Lab Work: NONE ORDERED  TODAY   If you have labs (blood work) drawn today and your tests are completely normal, you will receive your results only by: MyChart Message (if you have MyChart) OR A paper copy in the mail If you have any lab test that is abnormal or we need to change your treatment, we will call you to review the results.   Testing/Procedures: NONE ORDERED  TODAY    Follow-Up: At [facility], you and your health needs are our priority.  As part of our continuing mission to provide you with exceptional heart care, we have created designated Provider Care Teams.  These Care Teams include your primary Cardiologist (physician) and Advanced Practice Providers (APPs -  Physician Assistants and Nurse Practitioners) who all work together to provide you with the care you need, when you need it.  We recommend signing up for the patient portal called "MyChart".  Sign up information is provided on this After Visit Summary.  MyChart is used to connect with patients for Virtual Visits (Telemedicine).  Patients are able to view lab/test results, encounter notes, upcoming appointments, etc.  Non-urgent messages can be sent to your provider as well.   To learn more about what you can do with MyChart, go to [email or web address].    Your next appointment:   6 month(s)  Provider:   Renee Ursuy, PA-C    Other Instructions  

## 2022-12-23 NOTE — Progress Notes (Signed)
Remote ICD transmission.   

## 2023-01-07 DIAGNOSIS — M2042 Other hammer toe(s) (acquired), left foot: Secondary | ICD-10-CM | POA: Diagnosis not present

## 2023-01-07 DIAGNOSIS — M2012 Hallux valgus (acquired), left foot: Secondary | ICD-10-CM | POA: Diagnosis not present

## 2023-01-07 DIAGNOSIS — E1142 Type 2 diabetes mellitus with diabetic polyneuropathy: Secondary | ICD-10-CM | POA: Diagnosis not present

## 2023-01-07 DIAGNOSIS — Z89421 Acquired absence of other right toe(s): Secondary | ICD-10-CM | POA: Diagnosis not present

## 2023-01-07 DIAGNOSIS — B351 Tinea unguium: Secondary | ICD-10-CM | POA: Diagnosis not present

## 2023-01-07 DIAGNOSIS — Z89411 Acquired absence of right great toe: Secondary | ICD-10-CM | POA: Diagnosis not present

## 2023-03-07 ENCOUNTER — Ambulatory Visit (INDEPENDENT_AMBULATORY_CARE_PROVIDER_SITE_OTHER): Payer: 59

## 2023-03-07 DIAGNOSIS — I472 Ventricular tachycardia, unspecified: Secondary | ICD-10-CM | POA: Diagnosis not present

## 2023-03-07 LAB — CUP PACEART REMOTE DEVICE CHECK
Battery Remaining Longevity: 126 mo
Battery Remaining Percentage: 100 %
Brady Statistic RA Percent Paced: 13 %
Brady Statistic RV Percent Paced: 0 %
Date Time Interrogation Session: 20241028041100
HighPow Impedance: 70 Ohm
Implantable Lead Connection Status: 753985
Implantable Lead Connection Status: 753985
Implantable Lead Implant Date: 20200417
Implantable Lead Implant Date: 20200417
Implantable Lead Location: 753859
Implantable Lead Location: 753860
Implantable Lead Model: 292
Implantable Lead Model: 7740
Implantable Lead Serial Number: 1018284
Implantable Lead Serial Number: 445149
Implantable Pulse Generator Implant Date: 20200417
Lead Channel Impedance Value: 540 Ohm
Lead Channel Impedance Value: 604 Ohm
Lead Channel Setting Pacing Amplitude: 2 V
Lead Channel Setting Pacing Amplitude: 3 V
Lead Channel Setting Pacing Pulse Width: 0.4 ms
Lead Channel Setting Sensing Sensitivity: 0.5 mV
Pulse Gen Serial Number: 552225
Zone Setting Status: 755011

## 2023-03-28 NOTE — Progress Notes (Signed)
Remote ICD transmission.   

## 2023-04-15 DIAGNOSIS — M79671 Pain in right foot: Secondary | ICD-10-CM | POA: Diagnosis not present

## 2023-04-15 DIAGNOSIS — Z9889 Other specified postprocedural states: Secondary | ICD-10-CM | POA: Diagnosis not present

## 2023-06-06 ENCOUNTER — Ambulatory Visit (INDEPENDENT_AMBULATORY_CARE_PROVIDER_SITE_OTHER): Payer: Self-pay

## 2023-06-06 DIAGNOSIS — I255 Ischemic cardiomyopathy: Secondary | ICD-10-CM | POA: Diagnosis not present

## 2023-06-06 LAB — CUP PACEART REMOTE DEVICE CHECK
Battery Remaining Longevity: 126 mo
Battery Remaining Percentage: 100 %
Brady Statistic RA Percent Paced: 12 %
Brady Statistic RV Percent Paced: 0 %
Date Time Interrogation Session: 20250127041100
HighPow Impedance: 76 Ohm
Lead Channel Impedance Value: 559 Ohm
Lead Channel Impedance Value: 572 Ohm
Lead Channel Setting Pacing Amplitude: 2 V
Lead Channel Setting Pacing Amplitude: 3 V
Lead Channel Setting Pacing Pulse Width: 0.4 ms
Lead Channel Setting Sensing Sensitivity: 0.5 mV
Pulse Gen Serial Number: 552225
Zone Setting Status: 755011

## 2023-06-08 ENCOUNTER — Telehealth: Payer: Self-pay | Admitting: Internal Medicine

## 2023-06-08 ENCOUNTER — Telehealth: Payer: Self-pay | Admitting: *Deleted

## 2023-06-08 ENCOUNTER — Ambulatory Visit: Payer: 59 | Attending: Physician Assistant | Admitting: Physician Assistant

## 2023-06-08 DIAGNOSIS — Z01810 Encounter for preprocedural cardiovascular examination: Secondary | ICD-10-CM | POA: Diagnosis not present

## 2023-06-08 NOTE — Telephone Encounter (Signed)
   Name: Brittney Sawyer  DOB: 07-20-1965  MRN: [id number]  Primary Cardiologist: Lewayne Bunting, MD Procedure date: 06/13/23   Preoperative team, please contact this patient and set up a phone call appointment for further preoperative risk assessment. Please obtain consent and complete medication review. Thank you for your help.  I confirm that guidance regarding antiplatelet and oral anticoagulation therapy has been completed and, if necessary, noted below. None requested.   I also confirmed the patient resides in the state of [place]. As per [facility] Medical Board telemedicine laws, the patient must reside in the state in which the provider is licensed.  Rip Harbour, NP 06/08/2023, 11:47 AM [facility]

## 2023-06-08 NOTE — Telephone Encounter (Signed)
Notes faxed to surgeon. This phone note will be removed from the preop pool. [name], PA-C  06/08/2023 1:27 PM

## 2023-06-08 NOTE — Progress Notes (Signed)
   Virtual Visit via Telephone Note   Because of Brittney Sawyer's co-morbid illnesses, she is at least at moderate risk for complications without adequate follow up.  This format is felt to be most appropriate for this patient at this time.  The patient did not have access to video technology/had technical difficulties with video requiring transitioning to audio format only (telephone).  All issues noted in this document were discussed and addressed.  No physical exam could be performed with this format.  Please refer to the patient's chart for her consent to telehealth for [facility].  Evaluation Performed:  Preoperative cardiovascular risk assessment _____________   Date:  06/08/2023   Patient ID:  Brittney Sawyer, DOB 05-Sep-1965, MRN [id number] Patient Location[place]  [place] Provider location:   Office  Primary Care Provider:  [name], FNP Primary Cardiologist:  Lewayne Bunting, MD    Chief Complaint / Patient Profile   58 y.o. y/o female with a h/o   Coronary artery disease, nonobstructive LHC 08/25/2018: LAD mid 30 HFmrEF (heart failure with mildly reduced ejection fraction)  Nonischemic cardiomyopathy TTE 08/25/2018: EF 40-45, mild AI Cardiac MRI 08/25/2018: EF 41, inferoseptal/anteroseptal/apical anterior, septal, inferior DK, no LGE Ventricular tachycardia  s/p ICD Hypertension  Hyperlipidemia  Diabetes mellitus   She is pending right foot first metatarsal excision on 06/13/2023 by Dr. Shella Spearing under MAC anesthesia and presents today for telephonic preoperative cardiovascular risk assessment.  History of Present Illness    Brittney Sawyer is a 58 y.o. female who presents via audio/video conferencing for a telehealth visit today.  Pt was last seen in cardiology clinic on 12/22/2022 by Francis Dowse, PA-C.  At that time Brittney Sawyer was doing well.  The patient is now pending procedure as outlined above. Since her last visit, she has done well without chest pain,  shortness of breath, palpitations.  Her ICD has not shocked her.    Physical Exam    Vital Signs:  Brittney Sawyer does not have vital signs available for review today.  Given telephonic nature of communication, physical exam is limited. AAOx3. NAD. Normal affect.  Speech and respirations are unlabored.  Accessory Clinical Findings    None    Assessment & Plan    Assessment & Plan Preoperative cardiovascular examination Brittney Sawyer's perioperative risk of a major cardiac event is 0.9% according to the Revised Cardiac Risk Index (RCRI).  Therefore, she is at low risk for perioperative complications.   Her functional capacity is good at 4.31 METs according to the Duke Activity Status Index (DASI). Recommendations: According to ACC/AHA guidelines, no further cardiovascular testing needed.  The patient may proceed to surgery at acceptable risk.   Antiplatelet and/or Anticoagulation Recommendations: Aspirin can be held for 5-7 days prior to her surgery.  Please resume Aspirin post operatively when it is felt to be safe from a bleeding standpoint.    The patient was advised that if she develops new symptoms prior to surgery to contact our office to arrange for a follow-up visit, and she verbalized understanding.  A copy of this note will be routed to requesting surgeon.  Time:   Today, I have spent 3.5 minutes with the patient with telehealth technology discussing medical history, symptoms, and management plan.     [name], PA-C 06/08/2023, 1:26 PM

## 2023-06-08 NOTE — Telephone Encounter (Signed)
Per [name], PAC preop APP today to add pt to schedule today ok at 1:20.   Med rec and consent are done.     Patient Consent for Virtual Visit        Brittney Sawyer has provided verbal consent on 06/08/2023 for a virtual visit (video or telephone).   CONSENT FOR VIRTUAL VISIT FOR:  Brittney Sawyer  By participating in this virtual visit I agree to the following:  I hereby voluntarily request, consent and authorize [facility] and its employed or contracted physicians, physician assistants, nurse practitioners or other licensed health care professionals (the Practitioner), to provide me with telemedicine health care services (the "Services") as deemed necessary by the treating Practitioner. I acknowledge and consent to receive the Services by the Practitioner via telemedicine. I understand that the telemedicine visit will involve communicating with the Practitioner through live audiovisual communication technology and the disclosure of certain medical information by electronic transmission. I acknowledge that I have been given the opportunity to request an in-person assessment or other available alternative prior to the telemedicine visit and am voluntarily participating in the telemedicine visit.  I understand that I have the right to withhold or withdraw my consent to the use of telemedicine in the course of my care at any time, without affecting my right to future care or treatment, and that the Practitioner or I may terminate the telemedicine visit at any time. I understand that I have the right to inspect all information obtained and/or recorded in the course of the telemedicine visit and may receive copies of available information for a reasonable fee.  I understand that some of the potential risks of receiving the Services via telemedicine include:  Delay or interruption in medical evaluation due to technological equipment failure or disruption; Information transmitted may not be  sufficient (e.g. poor resolution of images) to allow for appropriate medical decision making by the Practitioner; and/or  In rare instances, security protocols could fail, causing a breach of personal health information.  Furthermore, I acknowledge that it is my responsibility to provide information about my medical history, conditions and care that is complete and accurate to the best of my ability. I acknowledge that Practitioner's advice, recommendations, and/or decision may be based on factors not within their control, such as incomplete or inaccurate data provided by me or distortions of diagnostic images or specimens that may result from electronic transmissions. I understand that the practice of medicine is not an exact science and that Practitioner makes no warranties or guarantees regarding treatment outcomes. I acknowledge that a copy of this consent can be made available to me via my patient portal [facility] MyChart), or I can request a printed copy by calling the office of [facility].    I understand that my insurance will be billed for this visit.   I have read or had this consent read to me. I understand the contents of this consent, which adequately explains the benefits and risks of the Services being provided via telemedicine.  I have been provided ample opportunity to ask questions regarding this consent and the Services and have had my questions answered to my satisfaction. I give my informed consent for the services to be provided through the use of telemedicine in my medical care

## 2023-06-08 NOTE — Telephone Encounter (Signed)
Per [name], PAC preop APP today to add pt to schedule today ok at 1:20.   Med rec and consent are done.

## 2023-06-08 NOTE — Telephone Encounter (Signed)
   Pre-operative Risk Assessment    Patient Name: Brittney Sawyer  DOB: October 14, 1965 MRN: [id number]   Date of last office visit: 12/22/22  Date of next office visit: nothing    Request for Surgical Clearance    Procedure:   Right FOOT 1st metatarsal EXOSTOSIS EXCISION (Partial toe amputation)   Date of Surgery:  Clearance 06/13/23                                Surgeon:  Dr. Harless Nakayama Group or Practice Name:  [facility]  Phone number:  [phone] Fax number:  [phone]   Type of Clearance Requested:   - Medical    Type of Anesthesia:  MAC   Additional requests/questions:    SignedForest Gleason   06/08/2023, 11:05 AM

## 2023-06-10 NOTE — Telephone Encounter (Signed)
Outreach made to [employer].  Per tech services place magnet over device during procedure.  Device will emit a beep once every second while magnet is applied.  Use a grounding mat during procedure.  When procedure is complete remove magnet.  Device will return to normal function.  No programming needed.  Call placed to [facility] [place].  Kim advised of above.  She indicates understanding.  No further action needed.

## 2023-06-10 NOTE — Telephone Encounter (Signed)
[facility] calling in asking pt to be cleared from Device prior to. She states they need to know what to do with her device, since they using a Bovie. Please advise.   Her personal number: [phone]

## 2023-06-13 DIAGNOSIS — M89271 Other disorders of bone development and growth, right ankle and foot: Secondary | ICD-10-CM | POA: Diagnosis not present

## 2023-06-13 DIAGNOSIS — M898X7 Other specified disorders of bone, ankle and foot: Secondary | ICD-10-CM | POA: Diagnosis not present

## 2023-06-13 DIAGNOSIS — M25774 Osteophyte, right foot: Secondary | ICD-10-CM | POA: Diagnosis not present

## 2023-06-22 ENCOUNTER — Other Ambulatory Visit: Payer: Self-pay | Admitting: Psychology

## 2023-07-15 NOTE — Progress Notes (Signed)
 Remote ICD transmission.

## 2023-09-05 ENCOUNTER — Ambulatory Visit (INDEPENDENT_AMBULATORY_CARE_PROVIDER_SITE_OTHER): Payer: Self-pay

## 2023-09-05 DIAGNOSIS — I255 Ischemic cardiomyopathy: Secondary | ICD-10-CM

## 2023-09-05 DIAGNOSIS — I472 Ventricular tachycardia, unspecified: Secondary | ICD-10-CM

## 2023-09-05 LAB — CUP PACEART REMOTE DEVICE CHECK
Battery Remaining Longevity: 120 mo
Battery Remaining Percentage: 100 %
Brady Statistic RA Percent Paced: 10 %
Brady Statistic RV Percent Paced: 0 %
Date Time Interrogation Session: 20250428041100
HighPow Impedance: 81 Ohm
Lead Channel Impedance Value: 541 Ohm
Lead Channel Impedance Value: 599 Ohm
Lead Channel Setting Pacing Amplitude: 2 V
Lead Channel Setting Pacing Amplitude: 3 V
Lead Channel Setting Pacing Pulse Width: 0.4 ms
Lead Channel Setting Sensing Sensitivity: 0.5 mV
Pulse Gen Serial Number: 552225
Zone Setting Status: 755011

## 2023-09-06 ENCOUNTER — Encounter: Payer: Self-pay | Admitting: Internal Medicine

## 2023-10-21 DIAGNOSIS — L84 Corns and callosities: Secondary | ICD-10-CM | POA: Diagnosis not present

## 2023-10-21 DIAGNOSIS — B351 Tinea unguium: Secondary | ICD-10-CM | POA: Diagnosis not present

## 2023-10-21 DIAGNOSIS — Z89411 Acquired absence of right great toe: Secondary | ICD-10-CM | POA: Diagnosis not present

## 2023-10-21 DIAGNOSIS — Z89421 Acquired absence of other right toe(s): Secondary | ICD-10-CM | POA: Diagnosis not present

## 2023-10-21 DIAGNOSIS — E1142 Type 2 diabetes mellitus with diabetic polyneuropathy: Secondary | ICD-10-CM | POA: Diagnosis not present

## 2023-10-27 NOTE — Progress Notes (Signed)
 Remote ICD transmission.

## 2023-11-15 DIAGNOSIS — M2042 Other hammer toe(s) (acquired), left foot: Secondary | ICD-10-CM | POA: Diagnosis not present

## 2023-11-15 DIAGNOSIS — Z89411 Acquired absence of right great toe: Secondary | ICD-10-CM | POA: Diagnosis not present

## 2023-11-15 DIAGNOSIS — E1142 Type 2 diabetes mellitus with diabetic polyneuropathy: Secondary | ICD-10-CM | POA: Diagnosis not present

## 2023-11-15 DIAGNOSIS — M2041 Other hammer toe(s) (acquired), right foot: Secondary | ICD-10-CM | POA: Diagnosis not present

## 2023-11-15 DIAGNOSIS — Z89421 Acquired absence of other right toe(s): Secondary | ICD-10-CM | POA: Diagnosis not present

## 2023-12-05 ENCOUNTER — Ambulatory Visit (INDEPENDENT_AMBULATORY_CARE_PROVIDER_SITE_OTHER): Payer: Self-pay

## 2023-12-05 DIAGNOSIS — I255 Ischemic cardiomyopathy: Secondary | ICD-10-CM

## 2023-12-06 LAB — CUP PACEART REMOTE DEVICE CHECK
Battery Remaining Longevity: 120 mo
Battery Remaining Percentage: 100 %
Brady Statistic RA Percent Paced: 9 %
Brady Statistic RV Percent Paced: 0 %
Date Time Interrogation Session: 20250728041200
HighPow Impedance: 78 Ohm
Lead Channel Impedance Value: 505 Ohm
Lead Channel Impedance Value: 663 Ohm
Lead Channel Setting Pacing Amplitude: 2 V
Lead Channel Setting Pacing Amplitude: 3 V
Lead Channel Setting Pacing Pulse Width: 0.4 ms
Lead Channel Setting Sensing Sensitivity: 0.5 mV
Pulse Gen Serial Number: 552225
Zone Setting Status: 755011

## 2023-12-09 ENCOUNTER — Ambulatory Visit: Payer: Self-pay | Admitting: Internal Medicine

## 2024-02-09 DIAGNOSIS — F3170 Bipolar disorder, currently in remission, most recent episode unspecified: Secondary | ICD-10-CM | POA: Diagnosis not present

## 2024-02-09 NOTE — Progress Notes (Signed)
 Remote ICD Transmission

## 2024-02-27 DIAGNOSIS — F3170 Bipolar disorder, currently in remission, most recent episode unspecified: Secondary | ICD-10-CM | POA: Diagnosis not present

## 2024-03-05 ENCOUNTER — Ambulatory Visit (INDEPENDENT_AMBULATORY_CARE_PROVIDER_SITE_OTHER): Payer: Self-pay

## 2024-03-05 DIAGNOSIS — I255 Ischemic cardiomyopathy: Secondary | ICD-10-CM

## 2024-03-07 LAB — CUP PACEART REMOTE DEVICE CHECK
Battery Remaining Longevity: 120 mo
Battery Remaining Percentage: 100 %
Brady Statistic RA Percent Paced: 9 %
Brady Statistic RV Percent Paced: 0 %
Date Time Interrogation Session: 20251027042100
HighPow Impedance: 71 Ohm
Lead Channel Impedance Value: 534 Ohm
Lead Channel Impedance Value: 615 Ohm
Lead Channel Setting Pacing Amplitude: 2 V
Lead Channel Setting Pacing Amplitude: 3 V
Lead Channel Setting Pacing Pulse Width: 0.4 ms
Lead Channel Setting Sensing Sensitivity: 0.5 mV
Pulse Gen Serial Number: 552225
Zone Setting Status: 755011

## 2024-03-08 DIAGNOSIS — F3170 Bipolar disorder, currently in remission, most recent episode unspecified: Secondary | ICD-10-CM | POA: Diagnosis not present

## 2024-03-13 NOTE — Progress Notes (Signed)
 Remote ICD Transmission

## 2024-03-14 DIAGNOSIS — F3170 Bipolar disorder, currently in remission, most recent episode unspecified: Secondary | ICD-10-CM | POA: Diagnosis not present

## 2024-03-15 ENCOUNTER — Ambulatory Visit: Payer: Self-pay | Admitting: Internal Medicine

## 2024-03-19 NOTE — Progress Notes (Unsigned)
 Cardiology Office Note Date:  03/19/2024  Patient ID:  Brittney, Sawyer [birth year], MRN [id number] PCP:  Lenon Ludie BIRCH, FNP (Inactive)  Electrophysiologist: Dr. Waddell    Chief Complaint: over due visit  History of Present Illness: Brittney Sawyer is a 58 y.o. female with history of  DM, obesity, smoker, HLD, and insomnia, Depression,  VT, ICD, NICM, chronic CHF (systolic)  Saw A. Tillery, PA-C 10/01/21, doing well, pending toe amputations (for hammer toe).  Felt a reasonable candidate No changes were made.  I saw her Aug 2024 She is doing very well Exercising regularly, on the treadmill 1 - 1.5 hrs most days Reports good exercise capacity No CP, palpitations or cardiac awareness No near syncope or syncope. For years she reports some orthostatic dizziness, not new, not changing She states her PMD does her labs regularly Last A1c was 6.7 She is likely going to need surgery coming up in the next weeks/months or so, pending MRI, suspect torn rotator cuff and ruptured bicep. No changes were made No VT noted    TODAY  She is doing well physically Of late struggling with some depression and not as active as usual, but not completely sedentary either She is seeing a therapist and working on this.  No CP, palpitations or cardiac awareness No SOB, DOE No near syncope or syncope   Device information BSCi dual chamber ICD implanted 08/25/2018 Secondary prevention Presented to ER in [place] (Unable to induce [place] at time of implant)  RV lead is on advisory (single coil)    Past Medical History:  Diagnosis Date   Anxiety    Arthritis    all my joints (06/20/2017)   Depression    Diabetes mellitus type 2, controlled (HCC) 05/20/2014   GERD (gastroesophageal reflux disease)    gone w/diet revision (06/20/2017)   Hyperlipidemia    Hypertension    Infection    right knee replacement   Neuropathy    post back surgery   Pneumonia X 1   Poorly controlled diabetes  mellitus (HCC) 07/26/2017   Right knee DJD 01/27/2013   Spinal headache    Substance abuse (HCC)    Alcohol - sober since 1996    Past Surgical History:  Procedure Laterality Date   AMPUTATION Right 10/03/2015   Procedure: Right Foot 3rd Ray Amputation;  Surgeon: Jerona Harden GAILS, MD;  Location: [facility] OR;  Service: Orthopedics;  Laterality: Right;   BACK SURGERY     BUNIONECTOMY WITH HAMMERTOE RECONSTRUCTION Bilateral    EXCISIONAL TOTAL KNEE ARTHROPLASTY WITH ANTIBIOTIC SPACERS Right 06/20/2017   EXCISIONAL TOTAL KNEE ARTHROPLASTY WITH ANTIBIOTIC SPACERS Right 06/20/2017   Procedure: EXCISION RIGHT TOTAL KNEE ARTHROPLASTY WITH ANTIBIOTIC SPACERS;  Surgeon: Rubie Kemps, MD;  Location: [facility] OR;  Service: Orthopedics;  Laterality: Right;   ICD IMPLANT N/A 08/25/2018   Procedure: ICD IMPLANT;  Surgeon: Waddell Danelle ORN, MD;  Location: [facility] INVASIVE CV LAB;  Service: Cardiovascular;  Laterality: N/A;   JOINT REPLACEMENT     LEFT HEART CATH AND CORONARY ANGIOGRAPHY N/A 08/25/2018   Procedure: LEFT HEART CATH AND CORONARY ANGIOGRAPHY;  Surgeon: Cherrie Toribio SAUNDERS, MD;  Location: [facility] INVASIVE CV LAB;  Service: Cardiovascular;  Laterality: N/A;   POSTERIOR LUMBAR FUSION  11/2007   L4, L5    POSTERIOR LUMBAR FUSION  01/2008     second surgery L2-L5 and S1   SHOULDER ARTHROSCOPY W/ ROTATOR CUFF REPAIR Left 01/2017   TOTAL KNEE ARTHROPLASTY Right 01/26/2013  Procedure: TOTAL KNEE ARTHROPLASTY;  Surgeon: LELON JONETTA Shari Mickey., MD;  Location: [facility] OR;  Service: Orthopedics;  Laterality: Right;   TOTAL KNEE REVISION Right 08/22/2017   Procedure: RIGHT TOTAL KNEE REVISION WUITH REMOVAL OF ANTIBIOTIC SPACER;  Surgeon: Rubie Kemps, MD;  Location: [facility] OR;  Service: Orthopedics;  Laterality: Right;   TUBAL LIGATION     VAGINAL HYSTERECTOMY      Current Outpatient Medications  Medication Sig Dispense Refill   acetaminophen  (TYLENOL ) 500 MG tablet Take 2 tablets (1,000 mg total) by mouth every 8 (eight) hours as needed.  (Patient taking differently: Take 500-1,000 mg by mouth every 8 (eight) hours as needed (for pain or headaches).) 30 tablet 0   acyclovir  (ZOVIRAX ) 200 MG capsule as needed. breakouts     albuterol  (VENTOLIN  HFA) 108 (90 Base) MCG/ACT inhaler Inhale into the lungs.     ARIPiprazole (ABILIFY) 2 MG tablet Take 2 mg by mouth daily.     aspirin  EC 81 MG tablet Take 81 mg by mouth daily.     atorvastatin  (LIPITOR) 20 MG tablet Take 20 mg by mouth daily.     B Complex-C (B-COMPLEX WITH VITAMIN C) tablet Take 1 tablet by mouth daily.     busPIRone (BUSPAR) 15 MG tablet Take 15 mg by mouth as needed.     FLUoxetine (PROZAC) 40 MG capsule Take by mouth.     gabapentin  (NEURONTIN ) 800 MG tablet TAKE ONE TABLET BY MOUTH FOUR TIMES A DAY     metoprolol  succinate (TOPROL  XL) 25 MG 24 hr tablet Take 0.5 tablets (12.5 mg total) by mouth daily. 90 tablet 3   Multiple Vitamin (MULTIVITAMIN) tablet Take 1 tablet by mouth daily.     nystatin cream (MYCOSTATIN) Apply topically as needed.     traZODone  (DESYREL ) 100 MG tablet TAKE ONE TABLET BY MOUTH THREE TIMES A DAY -- NEED OFFICE VISIT (Patient taking differently: Take 200-300 mg by mouth at bedtime.) 90 tablet 0   Triamcinolone Acetonide (NASACORT ALLERGY 24HR NA) Place into the nose.     TRULICITY 0.75 MG/0.5ML SOPN 0.75 mg once a week.     No current facility-administered medications for this visit.    Allergies:   Celebrex [celecoxib] and Wellbutrin [bupropion]   Social History:  The patient  reports that she quit smoking about 6 years ago. Her smoking use included cigarettes. She started smoking about 21 years ago. She has a 7.5 pack-year smoking history. She has never used smokeless tobacco. She reports current drug use. Drugs: Marijuana, Oxycodone , and Hydrocodone . She reports that she does not drink alcohol.   Family History:  The patient's family history includes Anxiety disorder in her sister; Cancer in her paternal grandmother; Hypertension in her  father; Thyroid  disease in her mother.  ROS:  Please see the history of present illness.    All other systems are reviewed and otherwise negative.   PHYSICAL EXAM:  VS:  There were no vitals taken for this visit. BMI: There is no height or weight on file to calculate BMI. Well nourished, well developed, in no acute distress HEENT: normocephalic, atraumatic Neck: no JVD, carotid bruits or masses Cardiac: RRR; no significant murmurs, no rubs, or gallops Lungs: CTA b/l, no wheezing, rhonchi or rales Abd: soft, nontender MS: no deformity or atrophy Ext: no edema Skin: warm and dry, no rash Neuro:  No gross deficits appreciated Psych: euthymic mood, full affect  ICD site is stable, no tethering or discomfort   EKG:  Done  today and reviewed by myself shows  SR 69bpm, 1st degree AVblock  Device interrogation done today and reviewed by myself:  Battery and lead measurements are good No arrhythmias AP 9% VP <1% HV impedance is 72 Ohms Programmed appropriately   Echo 08/25/18 IMPRESSIONS  1. The left ventricle has mild-moderately reduced systolic function, with an ejection fraction of 40-45%. The cavity size was normal. There is concentric left ventricular hypertrophy. Left ventricular diastolic parameters were normal.  2. The right ventricle has normal systolic function. The cavity was mildly enlarged. There is no increase in right ventricular wall thickness.  3. The mitral valve is grossly normal.  4. The tricuspid valve is grossly normal.  5. The aortic valve is tricuspid. Mild thickening of the aortic valve. Mild calcification of the aortic valve. Aortic valve regurgitation is mild by color flow Doppler. No stenosis of the aortic valve.  6. The interatrial septum was not assessed.   SUMMARY No prior echocardiogram available for comparison. Estimated LVEF 40-45%. There is akinesis of the mid inferoseptal, anteroseptal and apical inferior, septal and anterior walls.  No thrombus is seen in the let ventricular apex.    LEFT HEART CATH AND CORONARY ANGIOGRAPHY 08/25/18  Conclusions Mid LAD to Dist LAD lesion is 30% stenosed. Assessment & Plan: 1. Very mild plaque in distal LAD 2.  EF 45-50% 3.  LVEDP 24     Recent Labs: No results found for requested labs within last 365 days.  No results found for requested labs within last 365 days.   CrCl cannot be calculated (Patient's most recent lab result is older than the maximum 21 days allowed.).   Wt Readings from Last 3 Encounters:  12/22/22 166 lb (75.3 kg)  10/01/21 179 lb (81.2 kg)  08/10/19 179 lb 6.4 oz (81.4 kg)     Other studies reviewed: Additional studies/records reviewed today include: summarized above  ASSESSMENT AND PLAN:  ICD Intact function No programming changes made  HV impedance is stable and wnl No programming changes needed  NICM Appears well compensated, heart logic score is zero  No symptoms or exam findings to suggest volume OL BP limits GDMT Tolerating low dose BB Chronic orthostatic dizziness, no syncope > not an ongoing symptom  Will update her echo +/- gen cards referral   VT None on device   Discussed RV lead advisory Discussed Dr. Adrian upcoming retirement > will plan to transition to EP MD that she is assigned to via remotes    Disposition: F/u with us  in 78mo, sooner if needed  Current medicines are reviewed at length with the patient today.  The patient did not have any concerns regarding medicines.  Bonney Charlies Arthur, PA-C 03/19/2024 12:16 PM     [facility] [address] [place] [address] [phone] (office)  [phone] (fax)

## 2024-03-21 ENCOUNTER — Ambulatory Visit: Attending: Physician Assistant | Admitting: Physician Assistant

## 2024-03-21 ENCOUNTER — Encounter: Payer: Self-pay | Admitting: Physician Assistant

## 2024-03-21 VITALS — BP 100/52 | HR 69 | Ht 60.0 in | Wt 170.4 lb

## 2024-03-21 DIAGNOSIS — I428 Other cardiomyopathies: Secondary | ICD-10-CM | POA: Diagnosis not present

## 2024-03-21 DIAGNOSIS — Z95810 Presence of automatic (implantable) cardiac defibrillator: Secondary | ICD-10-CM | POA: Diagnosis not present

## 2024-03-21 DIAGNOSIS — I472 Ventricular tachycardia, unspecified: Secondary | ICD-10-CM

## 2024-03-21 LAB — CUP PACEART INCLINIC DEVICE CHECK
Date Time Interrogation Session: 20251112150649
HighPow Impedance: 72 Ohm
Implantable Lead Connection Status: 753985
Implantable Lead Connection Status: 753985
Implantable Lead Implant Date: 20200417
Implantable Lead Implant Date: 20200417
Implantable Lead Location: 753859
Implantable Lead Location: 753860
Implantable Lead Model: 292
Implantable Lead Model: 7740
Implantable Lead Serial Number: 1018284
Implantable Lead Serial Number: 445149
Implantable Pulse Generator Implant Date: 20200417
Lead Channel Impedance Value: 546 Ohm
Lead Channel Impedance Value: 615 Ohm
Lead Channel Pacing Threshold Amplitude: 0.8 V
Lead Channel Pacing Threshold Amplitude: 1.2 V
Lead Channel Pacing Threshold Pulse Width: 0.4 ms
Lead Channel Pacing Threshold Pulse Width: 0.4 ms
Lead Channel Sensing Intrinsic Amplitude: 14.7 mV
Lead Channel Sensing Intrinsic Amplitude: 5.6 mV
Lead Channel Setting Pacing Amplitude: 2 V
Lead Channel Setting Pacing Amplitude: 3 V
Lead Channel Setting Pacing Pulse Width: 0.4 ms
Lead Channel Setting Sensing Sensitivity: 0.5 mV
Pulse Gen Serial Number: 552225
Zone Setting Status: 755011

## 2024-03-21 NOTE — Patient Instructions (Signed)
 Medication Instructions:   Your physician recommends that you continue on your current medications as directed. Please refer to the Current Medication list given to you today.'   *If you need a refill on your cardiac medications before your next appointment, please call your pharmacy*   Lab Work: NONE ORDERED  TODAY    If you have labs (blood work) drawn today and your tests are completely normal, you will receive your results only by: MyChart Message (if you have MyChart) OR A paper copy in the mail If you have any lab test that is abnormal or we need to change your treatment, we will call you to review the results.   Testing/Procedures:  Your physician has requested that you have an echocardiogram. Echocardiography is a painless test that uses sound waves to create images of your heart. It provides your doctor with information about the size and shape of your heart and how well your heart's chambers and valves are working. This procedure takes approximately one hour. There are no restrictions for this procedure. Please do NOT wear cologne, perfume, aftershave, or lotions (deodorant is allowed). Please arrive 15 minutes prior to your appointment time.  Please note: We ask at that you not bring children with you during ultrasound (echo/ vascular) testing. Due to room size and safety concerns, children are not allowed in the ultrasound rooms during exams. Our front office staff cannot provide observation of children in our lobby area while testing is being conducted. An adult accompanying a patient to their appointment will only be allowed in the ultrasound room at the discretion of the ultrasound technician under special circumstances. We apologize for any inconvenience.      Follow-Up: At [facility], you and your health needs are our priority.  As part of our continuing mission to provide you with exceptional heart care, our providers are all part of one team.  This team  includes your primary Cardiologist (physician) and Advanced Practice Providers or APPs (Physician Assistants and Nurse Practitioners) who all work together to provide you with the care you need, when you need it.  Your next appointment:   6 month(s)  Provider:   You may see Charlies Arthur, PA-C Michael Andy Tillery, PA-C Brandi Ollis, NP Artist Pouch, PA-C  We recommend signing up for the patient portal called MyChart.  Sign up information is provided on this After Visit Summary.  MyChart is used to connect with patients for Virtual Visits (Telemedicine).  Patients are able to view lab/test results, encounter notes, upcoming appointments, etc.  Non-urgent messages can be sent to your provider as well.   To learn more about what you can do with MyChart, go to [email or web address].   Other Instructions

## 2024-03-28 DIAGNOSIS — F3170 Bipolar disorder, currently in remission, most recent episode unspecified: Secondary | ICD-10-CM | POA: Diagnosis not present

## 2024-04-19 ENCOUNTER — Ambulatory Visit (HOSPITAL_COMMUNITY)
Admission: RE | Admit: 2024-04-19 | Discharge: 2024-04-19 | Disposition: A | Discharge status: Home / Self Care | Source: Ambulatory Visit | Attending: Physician Assistant | Admitting: Physician Assistant

## 2024-04-19 DIAGNOSIS — I428 Other cardiomyopathies: Secondary | ICD-10-CM | POA: Diagnosis present

## 2024-04-19 DIAGNOSIS — R0609 Other forms of dyspnea: Secondary | ICD-10-CM | POA: Diagnosis not present

## 2024-04-19 DIAGNOSIS — R072 Precordial pain: Secondary | ICD-10-CM | POA: Diagnosis not present

## 2024-04-19 LAB — ECHOCARDIOGRAM COMPLETE
AR max vel: 1.92 cm2
AV Area VTI: 2.09 cm2
AV Area mean vel: 1.95 cm2
AV Mean grad: 4 mmHg
AV Peak grad: 7.8 mmHg
Ao pk vel: 1.4 m/s
Area-P 1/2: 3.12 cm2
S' Lateral: 1.54 cm

## 2024-04-19 MED ADMIN — Perflutren Lipid Microsphere IV Susp 1.1 MG/ML: 4 mL | INTRAVENOUS | NDC 99999100031

## 2024-04-20 ENCOUNTER — Ambulatory Visit: Payer: Self-pay | Admitting: Physician Assistant

## 2024-04-25 DIAGNOSIS — F3170 Bipolar disorder, currently in remission, most recent episode unspecified: Secondary | ICD-10-CM | POA: Diagnosis not present

## 2024-05-08 DIAGNOSIS — F411 Generalized anxiety disorder: Secondary | ICD-10-CM | POA: Diagnosis not present

## 2024-05-08 DIAGNOSIS — F314 Bipolar disorder, current episode depressed, severe, without psychotic features: Secondary | ICD-10-CM | POA: Diagnosis not present

## 2024-06-04 ENCOUNTER — Ambulatory Visit: Payer: Self-pay

## 2024-06-04 DIAGNOSIS — I255 Ischemic cardiomyopathy: Secondary | ICD-10-CM

## 2024-06-05 ENCOUNTER — Ambulatory Visit: Payer: Self-pay | Admitting: Cardiovascular Disease

## 2024-06-05 LAB — CUP PACEART REMOTE DEVICE CHECK
Battery Remaining Longevity: 108 mo
Battery Remaining Percentage: 100 %
Brady Statistic RA Percent Paced: 3 %
Brady Statistic RV Percent Paced: 0 %
Date Time Interrogation Session: 20260126041100
HighPow Impedance: 80 Ohm
Lead Channel Impedance Value: 581 Ohm
Lead Channel Impedance Value: 614 Ohm
Lead Channel Setting Pacing Amplitude: 2 V
Lead Channel Setting Pacing Amplitude: 3 V
Lead Channel Setting Pacing Pulse Width: 0.4 ms
Lead Channel Setting Sensing Sensitivity: 0.5 mV
Pulse Gen Serial Number: 552225
Zone Setting Status: 755011

## 2024-06-12 NOTE — Progress Notes (Signed)
 Remote ICD Transmission

## 2024-09-03 ENCOUNTER — Ambulatory Visit: Payer: Self-pay

## 2024-12-03 ENCOUNTER — Ambulatory Visit: Payer: Self-pay

## 2025-03-04 ENCOUNTER — Ambulatory Visit: Payer: Self-pay
